# Patient Record
Sex: Female | Born: 1945
Health system: Southern US, Community
[De-identification: ages and names within clinical notes are randomized; demographics above are authoritative.]

## PROBLEM LIST (undated history)

## (undated) DIAGNOSIS — R252 Cramp and spasm: Secondary | ICD-10-CM

## (undated) DIAGNOSIS — F329 Major depressive disorder, single episode, unspecified: Secondary | ICD-10-CM

## (undated) DIAGNOSIS — F32A Depression, unspecified: Secondary | ICD-10-CM

## (undated) DIAGNOSIS — I10 Essential (primary) hypertension: Secondary | ICD-10-CM

## (undated) DIAGNOSIS — C50212 Malignant neoplasm of upper-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 breast: Secondary | ICD-10-CM

## (undated) DIAGNOSIS — C50911 Malignant neoplasm of unspecified site of right female breast: Secondary | ICD-10-CM

## (undated) DIAGNOSIS — E785 Hyperlipidemia, unspecified: Secondary | ICD-10-CM

## (undated) DIAGNOSIS — J189 Pneumonia, unspecified organism: Secondary | ICD-10-CM

## (undated) DIAGNOSIS — M810 Age-related osteoporosis without current pathological fracture: Secondary | ICD-10-CM

## (undated) DIAGNOSIS — C50912 Malignant neoplasm of unspecified site of left female breast: Secondary | ICD-10-CM

## (undated) DIAGNOSIS — T7840XA Allergy, unspecified, initial encounter: Secondary | ICD-10-CM

## (undated) DIAGNOSIS — K219 Gastro-esophageal reflux disease without esophagitis: Secondary | ICD-10-CM

## (undated) DIAGNOSIS — D649 Anemia, unspecified: Secondary | ICD-10-CM

## (undated) DIAGNOSIS — Q396 Congenital diverticulum of esophagus: Secondary | ICD-10-CM

## (undated) DIAGNOSIS — F419 Anxiety disorder, unspecified: Secondary | ICD-10-CM

## (undated) DIAGNOSIS — I00 Rheumatic fever without heart involvement: Secondary | ICD-10-CM

## (undated) DIAGNOSIS — M1990 Unspecified osteoarthritis, unspecified site: Secondary | ICD-10-CM

## (undated) DIAGNOSIS — H269 Unspecified cataract: Secondary | ICD-10-CM

## (undated) DIAGNOSIS — G43909 Migraine, unspecified, not intractable, without status migrainosus: Secondary | ICD-10-CM

## (undated) HISTORY — DX: Malignant neoplasm of upper-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 breast: C50.212

## (undated) HISTORY — DX: Hyperlipidemia, unspecified: E78.5

## (undated) HISTORY — PX: COLONOSCOPY: SHX174

## (undated) HISTORY — PX: TONSILLECTOMY: SUR1361

## (undated) HISTORY — PX: EYE MUSCLE SURGERY: SHX370

## (undated) HISTORY — DX: Essential (primary) hypertension: I10

## (undated) HISTORY — DX: Major depressive disorder, single episode, unspecified: F32.9

## (undated) HISTORY — DX: Allergy, unspecified, initial encounter: T78.40XA

## (undated) HISTORY — DX: Anxiety disorder, unspecified: F41.9

## (undated) HISTORY — DX: Unspecified cataract: H26.9

## (undated) HISTORY — DX: Congenital diverticulum of esophagus: Q39.6

## (undated) HISTORY — PX: CATARACT EXTRACTION W/ INTRAOCULAR LENS  IMPLANT, BILATERAL: SHX1307

## (undated) HISTORY — DX: Depression, unspecified: F32.A

## (undated) HISTORY — DX: Age-related osteoporosis without current pathological fracture: M81.0

---

## 2002-08-27 ENCOUNTER — Encounter: Payer: Self-pay | Admitting: Emergency Medicine

## 2002-08-27 ENCOUNTER — Emergency Department (HOSPITAL_COMMUNITY): Admission: EM | Admit: 2002-08-27 | Discharge: 2002-08-27 | Payer: Self-pay | Admitting: Emergency Medicine

## 2006-02-19 ENCOUNTER — Other Ambulatory Visit: Admission: RE | Admit: 2006-02-19 | Discharge: 2006-02-19 | Payer: Self-pay | Admitting: Gynecology

## 2008-04-28 ENCOUNTER — Other Ambulatory Visit: Admission: RE | Admit: 2008-04-28 | Discharge: 2008-04-28 | Payer: Self-pay | Admitting: Family Medicine

## 2014-04-29 DIAGNOSIS — E785 Hyperlipidemia, unspecified: Secondary | ICD-10-CM | POA: Diagnosis not present

## 2014-04-29 DIAGNOSIS — M62838 Other muscle spasm: Secondary | ICD-10-CM | POA: Diagnosis not present

## 2014-04-29 DIAGNOSIS — I10 Essential (primary) hypertension: Secondary | ICD-10-CM | POA: Diagnosis not present

## 2014-04-29 DIAGNOSIS — K219 Gastro-esophageal reflux disease without esophagitis: Secondary | ICD-10-CM | POA: Diagnosis not present

## 2014-05-06 DIAGNOSIS — H2510 Age-related nuclear cataract, unspecified eye: Secondary | ICD-10-CM | POA: Diagnosis not present

## 2014-08-25 DIAGNOSIS — G4762 Sleep related leg cramps: Secondary | ICD-10-CM | POA: Diagnosis not present

## 2014-08-25 DIAGNOSIS — I10 Essential (primary) hypertension: Secondary | ICD-10-CM | POA: Diagnosis not present

## 2014-10-18 DIAGNOSIS — H2513 Age-related nuclear cataract, bilateral: Secondary | ICD-10-CM | POA: Diagnosis not present

## 2014-12-16 HISTORY — PX: BREAST BIOPSY: SHX20

## 2015-04-18 DIAGNOSIS — H2513 Age-related nuclear cataract, bilateral: Secondary | ICD-10-CM | POA: Diagnosis not present

## 2015-04-18 DIAGNOSIS — H01005 Unspecified blepharitis left lower eyelid: Secondary | ICD-10-CM | POA: Diagnosis not present

## 2015-04-18 DIAGNOSIS — H01002 Unspecified blepharitis right lower eyelid: Secondary | ICD-10-CM | POA: Diagnosis not present

## 2015-05-29 DIAGNOSIS — H2512 Age-related nuclear cataract, left eye: Secondary | ICD-10-CM | POA: Diagnosis not present

## 2015-05-29 DIAGNOSIS — H25811 Combined forms of age-related cataract, right eye: Secondary | ICD-10-CM | POA: Diagnosis not present

## 2015-05-29 DIAGNOSIS — H25812 Combined forms of age-related cataract, left eye: Secondary | ICD-10-CM | POA: Diagnosis not present

## 2015-06-26 DIAGNOSIS — H25811 Combined forms of age-related cataract, right eye: Secondary | ICD-10-CM | POA: Diagnosis not present

## 2015-06-26 DIAGNOSIS — H2511 Age-related nuclear cataract, right eye: Secondary | ICD-10-CM | POA: Diagnosis not present

## 2015-07-28 ENCOUNTER — Ambulatory Visit (INDEPENDENT_AMBULATORY_CARE_PROVIDER_SITE_OTHER): Payer: Medicare Other | Admitting: Family

## 2015-07-28 ENCOUNTER — Encounter: Payer: Self-pay | Admitting: Family

## 2015-07-28 VITALS — BP 138/88 | HR 74 | Temp 98.6°F | Resp 18 | Ht 60.0 in | Wt 152.8 lb

## 2015-07-28 DIAGNOSIS — K429 Umbilical hernia without obstruction or gangrene: Secondary | ICD-10-CM

## 2015-07-28 DIAGNOSIS — L821 Other seborrheic keratosis: Secondary | ICD-10-CM | POA: Insufficient documentation

## 2015-07-28 DIAGNOSIS — E781 Pure hyperglyceridemia: Secondary | ICD-10-CM

## 2015-07-28 DIAGNOSIS — I10 Essential (primary) hypertension: Secondary | ICD-10-CM | POA: Diagnosis not present

## 2015-07-28 MED ORDER — CYCLOBENZAPRINE HCL 10 MG PO TABS: 10.0000 mg | ORAL_TABLET | Freq: Three times a day (TID) | ORAL | Status: DC | PRN

## 2015-07-28 MED ORDER — LOSARTAN POTASSIUM-HCTZ 50-12.5 MG PO TABS: 1.0000 | ORAL_TABLET | Freq: Every day | ORAL | Status: DC

## 2015-07-28 MED ORDER — GEMFIBROZIL 600 MG PO TABS: 600.0000 mg | ORAL_TABLET | Freq: Two times a day (BID) | ORAL | Status: DC

## 2015-07-28 NOTE — Assessment & Plan Note (Signed)
Exam consistent with seborrheic keratosis, although cannot rule out wart. Refer to dermatology for further assessment and biopsy if necessary.

## 2015-07-28 NOTE — Patient Instructions (Signed)
Thank you for choosing [facility].  Summary/Instructions:  Your prescription(s) have been submitted to your pharmacy or been printed and provided for you. Please take as directed and contact our office if you believe you are having problem(s) with the medication(s) or have any questions.  Referrals have been made during this visit. You should expect to hear back from our schedulers in about 7-10 days in regards to establishing an appointment with the specialists we discussed.   If your symptoms worsen or fail to improve, please contact our office for further instruction, or in case of emergency go directly to the emergency room at the closest medical facility.   Hernia A hernia occurs when an internal organ pushes out through a weak spot in the abdominal wall. Hernias most commonly occur in the groin and around the navel. Hernias often can be pushed back into place (reduced). Most hernias tend to get worse over time. Some abdominal hernias can get stuck in the opening (irreducible or incarcerated hernia) and cannot be reduced. An irreducible abdominal hernia which is tightly squeezed into the opening is at risk for impaired blood supply (strangulated hernia). A strangulated hernia is a medical emergency. Because of the risk for an irreducible or strangulated hernia, surgery may be recommended to repair a hernia. CAUSES   Heavy lifting.  Prolonged coughing.  Straining to have a bowel movement.  A cut (incision) made during an abdominal surgery. HOME CARE INSTRUCTIONS   Bed rest is not required. You may continue your normal activities.  Avoid lifting more than 10 pounds (4.5 kg) or straining.  Cough gently. If you are a smoker it is best to stop. Even the best hernia repair can break down with the continual strain of coughing. Even if you do not have your hernia repaired, a cough will continue to aggravate the problem.  Do not wear anything tight over your hernia. Do not try to  keep it in with an outside bandage or truss. These can damage abdominal contents if they are trapped within the hernia sac.  Eat a normal diet.  Avoid constipation. Straining over long periods of time will increase hernia size and encourage breakdown of repairs. If you cannot do this with diet alone, stool softeners may be used. SEEK IMMEDIATE MEDICAL CARE IF:   You have a fever.  You develop increasing abdominal pain.  You feel nauseous or vomit.  Your hernia is stuck outside the abdomen, looks discolored, feels hard, or is tender.  You have any changes in your bowel habits or in the hernia that are unusual for you.  You have increased pain or swelling around the hernia.  You cannot push the hernia back in place by applying gentle pressure while lying down. MAKE SURE YOU:   Understand these instructions.  Will watch your condition.  Will get help right away if you are not doing well or get worse. Document Released: 12/02/2005 Document Revised: 02/24/2012 Document Reviewed: 07/21/2008 [facility] Patient Information 2015 [place], [place]. This information is not intended to replace advice given to you by your health care provider. Make sure you discuss any questions you have with your health care provider.

## 2015-07-28 NOTE — Assessment & Plan Note (Signed)
Umbilical hernia with some occasional discomfort. Refer to general surgery for follow up and possible repair.

## 2015-07-28 NOTE — Progress Notes (Signed)
Pre visit review using our clinic review tool, if applicable. No additional management support is needed unless otherwise documented below in the visit note. 

## 2015-07-28 NOTE — Progress Notes (Signed)
Subjective:    Patient ID: Kristen Howe, female    DOB: [birth year], 69 y.o.   MRN: [id number]  Chief Complaint  Patient presents with  . Establish Care    has what looks like an umblical hernia, has been there a while and has grown over time, x1 year, has a place under her left breast, x3 months    HPI:  Kristen Howe is a 69 y.o. female with a PMH of hyperlipidemia and hypertension who presents today for an office visit to establish care.     1.) Hernia - Associated symptom of a mass located in the center of her abdomen has been going on for about 1 year. It has increased in size since she first noticed it. Occasional mild pain described as soreness on occasion. Soreness dissaptes on its own. There have been no attempts at treatments. Sneezing and occasional coughing makes it worse.   2.) Spot on skin - Associated symptom of a rash located under her left breast is described as a "rubbery thing" has been there for about a month. Denies any changes in size or color since she first noticed it.   No Known Allergies   No outpatient prescriptions prior to visit.   No facility-administered medications prior to visit.     Past Medical History  Diagnosis Date  . Hypertension   . Hyperlipidemia      Past Surgical History  Procedure Laterality Date  . Eye surgery    . Cesarean section       Family History  Problem Relation Age of Onset  . Healthy Mother   . Lymphoma Father      Social History   Social History  . Marital Status: Divorced    Spouse Name: N/A  . Number of Children: 2  . Years of Education: 12   Occupational History  . Unemployed    Social History Main Topics  . Smoking status: Current Every Day Smoker -- 0.50 packs/day for 30 years    Types: Cigarettes  . Smokeless tobacco: Never Used  . Alcohol Use: 0.0 oz/week    0 Standard drinks or equivalent per week     Comment: occasionally  . Drug Use: No  . Sexual Activity: Not on file   Other  Topics Concern  . Not on file   Social History Narrative   Fun: Internet; games, talk to people   Denies religious beliefs effecting health care.   Denies abuse and feels safe at home.     Review of Systems  Constitutional: Negative for fever and chills.  Gastrointestinal: Negative for nausea, vomiting, abdominal pain, constipation and abdominal distention.  Skin: Positive for rash.      Objective:    BP 138/88 mmHg  Pulse 74  Temp(Src) 98.6 F (37 C) (Oral)  Resp 18  Ht 5' (1.524 m)  Wt 152 lb 12.8 oz (69.31 kg)  BMI 29.84 kg/m2  SpO2 96% Nursing note and vital signs reviewed.  Physical Exam  Constitutional: She is oriented to person, place, and time. She appears well-developed and well-nourished. No distress.  Cardiovascular: Normal rate, regular rhythm, normal heart sounds and intact distal pulses.   Pulmonary/Chest: Effort normal and breath sounds normal.    Abdominal: A hernia (Umbilical) is present.    Neurological: She is alert and oriented to person, place, and time.  Skin: Skin is warm and dry.  Psychiatric: She has a normal mood and affect. Her behavior is normal. Judgment and thought content  normal.       Assessment & Plan:   Problem List Items Addressed This Visit      Cardiovascular and Mediastinum   Essential hypertension   Relevant Medications   losartan-hydrochlorothiazide (HYZAAR) 50-12.5 MG per tablet   gemfibrozil (LOPID) 600 MG tablet     Musculoskeletal and Integument   Seborrheic keratosis    Exam consistent with seborrheic keratosis, although cannot rule out wart. Refer to dermatology for further assessment and biopsy if necessary.       Relevant Orders   Ambulatory referral to General Surgery     Other   Umbilical hernia without obstruction and without gangrene - Primary    Umbilical hernia with some occasional discomfort. Refer to general surgery for follow up and possible repair.       Relevant Orders   Ambulatory referral  to Dermatology    Other Visit Diagnoses    Hypertriglyceridemia        Relevant Medications    losartan-hydrochlorothiazide (HYZAAR) 50-12.5 MG per tablet    gemfibrozil (LOPID) 600 MG tablet

## 2015-08-03 ENCOUNTER — Encounter: Payer: Self-pay | Admitting: Family

## 2015-08-03 NOTE — Addendum Note (Signed)
Addended by: Mauricio Po D on: 08/03/2015 03:24 PM   Modules accepted: Orders

## 2015-08-10 ENCOUNTER — Telehealth: Payer: Self-pay | Admitting: Family

## 2015-08-10 NOTE — Telephone Encounter (Signed)
Received records from [facility] forwarded 35 pages to Dr. Mauricio Po 08/10/15 fbg.

## 2015-08-18 ENCOUNTER — Ambulatory Visit: Payer: Self-pay | Admitting: Surgery

## 2015-08-18 DIAGNOSIS — K420 Umbilical hernia with obstruction, without gangrene: Secondary | ICD-10-CM | POA: Diagnosis not present

## 2015-08-18 DIAGNOSIS — K432 Incisional hernia without obstruction or gangrene: Secondary | ICD-10-CM | POA: Diagnosis not present

## 2015-08-18 NOTE — H&P (Signed)
Kristen Howe 08/18/2015 8:51 AM Location: [facility] Patient #: [id number] DOB: 1946/07/24 Divorced / Language: Kristen Howe / Race: White Female History of Present Illness Kristen Moores A. Isaid Salvia MD; 08/18/2015 9:20 AM) Patient words: hernia  Pt sent at the request of Kristen Po FNP for umbilical/incisional hernia. It has been present for many years. It causes mild to moderate pain. It has stuck out for years she states. No change in bowel or bladder function. She has had a C section and incision extends to umbilicus where her hernia is located.  The patient is a 69 year old female   Other Problems Kristen Howe, Kristen Howe; 08/18/2015 8:51 AM) Arthritis Back Pain Gastroesophageal Reflux Disease High blood pressure Hypercholesterolemia  Past Surgical History Kristen Howe, [name]; 08/18/2015 8:51 AM) Cataract Surgery Bilateral. Cesarean Section - Multiple Oral Surgery Tonsillectomy  Diagnostic Studies History Kristen Howe, [name]; 08/18/2015 8:51 AM) Colonoscopy never Mammogram >3 years ago Pap Smear >5 years ago  Allergies Kristen Howe, [name]; 08/18/2015 8:52 AM) No Known Drug Allergies 08/18/2015  Medication History (Kristen Howe, [name]; 08/18/2015 8:53 AM) Gemfibrozil (600MG  Tablet, Oral) Active. Gentamicin Sulfate (0.3% Solution, Ophthalmic) Active. Losartan Potassium-HCTZ (50-12.5MG  Tablet, Oral) Active. Durezol (0.05% Emulsion, Ophthalmic) Active. Medications Reconciled  Social History Kristen Howe, [name]; 08/18/2015 8:51 AM) Alcohol use Moderate alcohol use. Caffeine use Coffee. No drug use Tobacco use Current every day smoker.  Family History Kristen Howe, [name]; 08/18/2015 8:51 AM) Breast Cancer Sister. Hypertension Mother.  Pregnancy / Birth History Kristen Howe, [name]; 08/18/2015 8:52 AM) Age at menarche 28 years. Age of menopause 51-55 Contraceptive History Oral contraceptives. Gravida 2 Maternal age 75-30 Para 2 Regular periods     Review of  Systems Kristen Howe Howe [name]; 08/18/2015 8:52 AM) General Not Present- Appetite Loss, Chills, Fatigue, Fever, Night Sweats, Weight Gain and Weight Loss. Skin Present- Dryness. Not Present- Change in Wart/Mole, Hives, Jaundice, New Lesions, Non-Healing Wounds, Rash and Ulcer. HEENT Present- Sinus Pain. Not Present- Earache, Hearing Loss, Hoarseness, Nose Bleed, Oral Ulcers, Ringing in the Ears, Seasonal Allergies, Sore Throat, Visual Disturbances, Wears glasses/contact lenses and Yellow Eyes. Respiratory Present- Snoring. Not Present- Bloody sputum, Chronic Cough, Difficulty Breathing and Wheezing. Breast Not Present- Breast Mass, Breast Pain, Nipple Discharge and Skin Changes. Cardiovascular Present- Leg Cramps and Swelling of Extremities. Not Present- Chest Pain, Difficulty Breathing Lying Down, Palpitations, Rapid Heart Rate and Shortness of Breath. Gastrointestinal Present- Indigestion and Nausea. Not Present- Abdominal Pain, Bloating, Bloody Stool, Change in Bowel Habits, Chronic diarrhea, Constipation, Difficulty Swallowing, Excessive gas, Gets full quickly at meals, Hemorrhoids, Rectal Pain and Vomiting. Female Genitourinary Not Present- Frequency, Nocturia, Painful Urination, Pelvic Pain and Urgency. Musculoskeletal Present- Back Pain and Joint Stiffness. Not Present- Joint Pain, Muscle Pain, Muscle Weakness and Swelling of Extremities. Neurological Not Present- Decreased Memory, Fainting, Headaches, Numbness, Seizures, Tingling, Tremor, Trouble walking and Weakness. Psychiatric Not Present- Anxiety, Bipolar, Change in Sleep Pattern, Depression, Fearful and Frequent crying. Endocrine Not Present- Cold Intolerance, Excessive Hunger, Hair Changes, Heat Intolerance, Hot flashes and New Diabetes. Hematology Not Present- Easy Bruising, Excessive bleeding, Gland problems, HIV and Persistent Infections.  Vitals (Kristen Howe [name]; 08/18/2015 8:52 AM) 08/18/2015 8:52 AM Weight: 155 lb Height: 60in Body  Surface Area: 1.73 m Body Mass Index: 30.27 kg/m Temp.: 57F(Temporal)  Pulse: 79 (Regular)  BP: 126/80 (Sitting, Left Arm, Standard)     Physical Exam (Kristen Grime A. Kristen Usery MD; 08/18/2015 9:21 AM)  General Mental Status-Alert. General Appearance-Consistent with stated age. Hydration-Well hydrated. Voice-Normal.  Eye Note: right eye strabismus  Chest and Lung Exam Chest and lung exam reveals -quiet, even and easy respiratory effort with no use of accessory muscles and on auscultation, normal breath sounds, no adventitious sounds and normal vocal resonance. Inspection Chest Wall - Normal. Back - normal.  Cardiovascular Cardiovascular examination reveals -normal heart sounds, regular rate and rhythm with no murmurs and normal pedal pulses bilaterally.  Abdomen Note: incarcerted 5 cm umbilical/ uper incisional hernia not tender mild chronic skin changes    Neurologic Neurologic evaluation reveals -alert and oriented x 3 with no impairment of recent or remote memory. Mental Status-Normal.  Musculoskeletal Normal Exam - Left-Upper Extremity Strength Normal and Lower Extremity Strength Normal. Normal Exam - Right-Upper Extremity Strength Normal and Lower Extremity Strength Normal.    Assessment & Plan (Kristen Carrero A. Kristen Cumpston MD; 08/18/2015 9:22 AM)  INCARCERATED UMBILICAL HERNIA (355.7  K42.0) Impression: discussd repair vs observation and pro and cons of each. She desires repair. The risk of hernia repair include bleeding, infection, organ injury, bowel injury, bladder injury, nerve injury recurrent hernia, blood clots, worsening of underlying condition, chronic pain, mesh use, open surgery, death, and the need for other operattions. Pt agrees to proceed  Current Plans You are being scheduled for surgery - Our schedulers will call you.  You should hear from our office's scheduling department within 5 working days about the location, date, and time of  surgery. We try to make accommodations for patient's preferences in scheduling surgery, but sometimes the OR schedule or the surgeon's schedule prevents [place] from making those accommodations.  If you have not heard from our office [phone]) in 5 working days, call the office and ask for your surgeon's nurse.  If you have other questions about your diagnosis, plan, or surgery, call the office and ask for your surgeon's nurse. Written instructions provided   The anatomy & physiology of the abdominal wall was discussed. The pathophysiology of hernias was discussed. Natural history risks without surgery including progeressive enlargement, pain, incarceration, & strangulation was discussed. Contributors to complications such as smoking, obesity, diabetes, prior surgery, etc were discussed.  I feel the risks of no intervention will lead to serious problems that outweigh the operative risks; therefore, I recommended surgery to reduce and repair the hernia. I explained laparoscopic techniques with possible need for an open approach. I noted the probable use of mesh to patch and/or buttress the hernia repair  Risks such as bleeding, infection, abscess, need for further treatment, heart attack, death, and other risks were discussed. I noted a good likelihood this will help address the problem. Goals of post-operative recovery were discussed as well. Possibility that this will not correct all symptoms was explained. I stressed the importance of low-impact activity, aggressive pain control, avoiding constipation, & not pushing through pain to minimize risk of post-operative chronic pain or injury. Possibility of reherniation especially with smoking, obesity, diabetes, immunosuppression, and other health conditions was discussed. We will work to minimize complications.  An educational handout further explaining the pathology & treatment options was given as well. Questions were answered. The patient expresses  understanding & wishes to proceed with surgery. Pt Education - [facility] Hernia Post-Op HCI (Gross): discussed with patient and provided information. Pt Education - [facility] Pain Control (Gross) Pt Education - Pamphlet Given - Hernia Surgery: discussed with patient and provided information. Pt Education - Overview of abdominal wall hernias in adults: discussed with patient and provided information. INCISIONAL HERNIA, WITHOUT OBSTRUCTION OR GANGRENE (553.21  K43.2)

## 2015-09-06 ENCOUNTER — Encounter (HOSPITAL_COMMUNITY): Payer: Self-pay

## 2015-09-06 ENCOUNTER — Encounter (HOSPITAL_COMMUNITY)
Admission: RE | Admit: 2015-09-06 | Discharge: 2015-09-06 | Disposition: A | Discharge status: Home / Self Care | Payer: Medicare Other | Source: Ambulatory Visit | Attending: Surgery | Admitting: Surgery

## 2015-09-06 DIAGNOSIS — Z01812 Encounter for preprocedural laboratory examination: Secondary | ICD-10-CM | POA: Diagnosis not present

## 2015-09-06 DIAGNOSIS — I10 Essential (primary) hypertension: Secondary | ICD-10-CM | POA: Diagnosis not present

## 2015-09-06 DIAGNOSIS — R001 Bradycardia, unspecified: Secondary | ICD-10-CM | POA: Insufficient documentation

## 2015-09-06 DIAGNOSIS — K429 Umbilical hernia without obstruction or gangrene: Secondary | ICD-10-CM | POA: Insufficient documentation

## 2015-09-06 HISTORY — DX: Gastro-esophageal reflux disease without esophagitis: K21.9

## 2015-09-06 HISTORY — DX: Pneumonia, unspecified organism: J18.9

## 2015-09-06 HISTORY — DX: Unspecified osteoarthritis, unspecified site: M19.90

## 2015-09-06 HISTORY — DX: Anemia, unspecified: D64.9

## 2015-09-06 HISTORY — DX: Cramp and spasm: R25.2

## 2015-09-06 LAB — CBC WITH DIFFERENTIAL/PLATELET
BASOS ABS: 0 10*3/uL (ref 0.0–0.1)
BASOS PCT: 1 %
Eosinophils Absolute: 0.2 10*3/uL (ref 0.0–0.7)
Eosinophils Relative: 3 %
HEMATOCRIT: 41.1 % (ref 36.0–46.0)
HEMOGLOBIN: 13.7 g/dL (ref 12.0–15.0)
LYMPHS PCT: 33 %
Lymphs Abs: 2.2 10*3/uL (ref 0.7–4.0)
MCH: 28.8 pg (ref 26.0–34.0)
MCHC: 33.3 g/dL (ref 30.0–36.0)
MCV: 86.5 fL (ref 78.0–100.0)
MONO ABS: 0.4 10*3/uL (ref 0.1–1.0)
Monocytes Relative: 7 %
NEUTROS ABS: 3.8 10*3/uL (ref 1.7–7.7)
NEUTROS PCT: 56 %
Platelets: 308 10*3/uL (ref 150–400)
RBC: 4.75 MIL/uL (ref 3.87–5.11)
RDW: 13.4 % (ref 11.5–15.5)
WBC: 6.6 10*3/uL (ref 4.0–10.5)

## 2015-09-06 LAB — COMPREHENSIVE METABOLIC PANEL
ALBUMIN: 4 g/dL (ref 3.5–5.0)
ALK PHOS: 67 U/L (ref 38–126)
ALT: 21 U/L (ref 14–54)
AST: 20 U/L (ref 15–41)
Anion gap: 6 (ref 5–15)
BILIRUBIN TOTAL: 0.4 mg/dL (ref 0.3–1.2)
BUN: 16 mg/dL (ref 6–20)
CO2: 29 mmol/L (ref 22–32)
CREATININE: 0.79 mg/dL (ref 0.44–1.00)
Calcium: 9.9 mg/dL (ref 8.9–10.3)
Chloride: 103 mmol/L (ref 101–111)
GFR calc Af Amer: >60 mL/min (ref >60)
GLUCOSE: 127 mg/dL — AB (ref 65–99)
Potassium: 3.9 mmol/L (ref 3.5–5.1)
Sodium: 138 mmol/L (ref 135–145)
TOTAL PROTEIN: 7.1 g/dL (ref 6.5–8.1)

## 2015-09-06 NOTE — Pre-Procedure Instructions (Signed)
Kristen Howe  09/06/2015     Your procedure is scheduled on  Tuesday, September 12, 2015 at 7:30 AM.   Report to [facility] Entrance "A" Admitting Office at 5:30 AM.   Call this number if you have problems the morning of surgery: [phone]   Any questions prior to day of surgery, please call [phone] between 8 & 4 PM.   Remember:  Do not eat food or drink liquids after midnight Monday, 09/11/15   Stop Multivitamins as of today.    Do not wear jewelry, make-up or nail polish.  Do not wear lotions, powders, or perfumes.  You may wear deodorant.  Do not shave 48 hours prior to surgery.    Do not bring valuables to the hospital.  [facility] is not responsible for any belongings or valuables.  Contacts, dentures or bridgework may not be worn into surgery.  Leave your suitcase in the car.  After surgery it may be brought to your room.  For patients admitted to the hospital, discharge time will be determined by your treatment team.  Patients discharged the day of surgery will not be allowed to drive home.   Special instructions: [place] - Preparing for Surgery  Before surgery, you can play an important role.  Because skin is not sterile, your skin needs to be as free of germs as possible.  You can reduce the number of germs on you skin by washing with CHG (chlorahexidine gluconate) soap before surgery.  CHG is an antiseptic cleaner which kills germs and bonds with the skin to continue killing germs even after washing.  Please DO NOT use if you have an allergy to CHG or antibacterial soaps.  If your skin becomes reddened/irritated stop using the CHG and inform your nurse when you arrive at Short Stay.  Do not shave (including legs and underarms) for at least 48 hours prior to the first CHG shower.  You may shave your face.  Please follow these instructions carefully:   1.  Shower with CHG Soap the night before surgery and the                                 morning of Surgery.  2.  If you choose to wash your hair, wash your hair first as usual with your       normal shampoo.  3.  After you shampoo, rinse your hair and body thoroughly to remove the                      Shampoo.  4.  Use CHG as you would any other liquid soap.  You can apply chg directly       to the skin and wash gently with scrungie or a clean washcloth.  5.  Apply the CHG Soap to your body ONLY FROM THE NECK DOWN.        Do not use on open wounds or open sores.  Avoid contact with your eyes, ears, mouth and genitals (private parts).  Wash genitals (private parts) with your normal soap.  6.  Wash thoroughly, paying special attention to the area where your surgery        will be performed.  7.  Thoroughly rinse your body with warm water from the neck down.  8.  DO NOT shower/wash with your normal soap after using and rinsing off  the CHG Soap.  9.  Pat yourself dry with a clean towel.            10.  Wear clean pajamas.            11.  Place clean sheets on your bed the night of your first shower and do not        sleep with pets.  Day of Surgery  Do not apply any lotions the morning of surgery.  Please wear clean clothes to the hospital.   Please read over the following fact sheets that you were given. Pain Booklet, Coughing and Deep Breathing and Surgical Site Infection Prevention

## 2015-09-07 ENCOUNTER — Other Ambulatory Visit (HOSPITAL_COMMUNITY): Payer: Medicare Other

## 2015-09-11 MED ORDER — DEXTROSE 5 % IV SOLN
3.0000 g | INTRAVENOUS | Status: AC
  Administered 2015-09-12: 3 g via INTRAVENOUS
  Filled 2015-09-11: qty 3000

## 2015-09-11 MED ORDER — CHLORHEXIDINE GLUCONATE 4 % EX LIQD: 1.0000 "application " | CUTANEOUS | Status: DC

## 2015-09-12 ENCOUNTER — Ambulatory Visit (HOSPITAL_COMMUNITY): Payer: Medicare Other | Admitting: Anesthesiology

## 2015-09-12 ENCOUNTER — Encounter (HOSPITAL_COMMUNITY)
Admission: RE | Disposition: A | Discharge status: Home / Self Care | Payer: Self-pay | Source: Ambulatory Visit | Attending: Surgery

## 2015-09-12 ENCOUNTER — Ambulatory Visit (HOSPITAL_COMMUNITY)
Admission: RE | Admit: 2015-09-12 | Discharge: 2015-09-12 | Disposition: A | Discharge status: Home / Self Care | Payer: Medicare Other | Source: Ambulatory Visit | Attending: Surgery | Admitting: Surgery

## 2015-09-12 ENCOUNTER — Encounter (HOSPITAL_COMMUNITY): Payer: Self-pay | Admitting: *Deleted

## 2015-09-12 DIAGNOSIS — K429 Umbilical hernia without obstruction or gangrene: Secondary | ICD-10-CM | POA: Diagnosis not present

## 2015-09-12 DIAGNOSIS — K219 Gastro-esophageal reflux disease without esophagitis: Secondary | ICD-10-CM | POA: Insufficient documentation

## 2015-09-12 DIAGNOSIS — I10 Essential (primary) hypertension: Secondary | ICD-10-CM | POA: Insufficient documentation

## 2015-09-12 DIAGNOSIS — F17200 Nicotine dependence, unspecified, uncomplicated: Secondary | ICD-10-CM | POA: Insufficient documentation

## 2015-09-12 DIAGNOSIS — M1990 Unspecified osteoarthritis, unspecified site: Secondary | ICD-10-CM | POA: Insufficient documentation

## 2015-09-12 DIAGNOSIS — E780 Pure hypercholesterolemia: Secondary | ICD-10-CM | POA: Insufficient documentation

## 2015-09-12 DIAGNOSIS — K430 Incisional hernia with obstruction, without gangrene: Secondary | ICD-10-CM | POA: Insufficient documentation

## 2015-09-12 DIAGNOSIS — D649 Anemia, unspecified: Secondary | ICD-10-CM | POA: Diagnosis not present

## 2015-09-12 DIAGNOSIS — Z79899 Other long term (current) drug therapy: Secondary | ICD-10-CM | POA: Insufficient documentation

## 2015-09-12 DIAGNOSIS — K432 Incisional hernia without obstruction or gangrene: Secondary | ICD-10-CM | POA: Diagnosis not present

## 2015-09-12 HISTORY — PX: INSERTION OF MESH: SHX5868

## 2015-09-12 HISTORY — PX: UMBILICAL HERNIA REPAIR: SHX196

## 2015-09-12 SURGERY — REPAIR, HERNIA, UMBILICAL, ADULT
Anesthesia: General | Site: Abdomen

## 2015-09-12 MED ORDER — ALBUTEROL SULFATE (2.5 MG/3ML) 0.083% IN NEBU
2.5000 mg | INHALATION_SOLUTION | Freq: Four times a day (QID) | RESPIRATORY_TRACT | Status: DC | PRN
  Administered 2015-09-12: 2.5 mg via RESPIRATORY_TRACT

## 2015-09-12 MED ORDER — ONDANSETRON HCL 4 MG/2ML IJ SOLN
INTRAMUSCULAR | Status: DC | PRN
  Administered 2015-09-12: 4 mg via INTRAVENOUS

## 2015-09-12 MED ORDER — PHENYLEPHRINE 40 MCG/ML (10ML) SYRINGE FOR IV PUSH (FOR BLOOD PRESSURE SUPPORT)
PREFILLED_SYRINGE | INTRAVENOUS | Status: AC
  Filled 2015-09-12: qty 10

## 2015-09-12 MED ORDER — ONDANSETRON HCL 4 MG/2ML IJ SOLN
INTRAMUSCULAR | Status: AC
  Filled 2015-09-12: qty 2

## 2015-09-12 MED ORDER — PHENYLEPHRINE HCL 10 MG/ML IJ SOLN
INTRAMUSCULAR | Status: DC | PRN
  Administered 2015-09-12 (×3): 80 ug via INTRAVENOUS

## 2015-09-12 MED ORDER — GLYCOPYRROLATE 0.2 MG/ML IJ SOLN
INTRAMUSCULAR | Status: DC | PRN
  Administered 2015-09-12: 0.6 mg via INTRAVENOUS

## 2015-09-12 MED ORDER — DEXTROSE 5 % IV SOLN
INTRAVENOUS | Status: DC | PRN
  Administered 2015-09-12: 08:00:00 via INTRAVENOUS

## 2015-09-12 MED ORDER — LIDOCAINE HCL (CARDIAC) 20 MG/ML IV SOLN
INTRAVENOUS | Status: AC
  Filled 2015-09-12: qty 5

## 2015-09-12 MED ORDER — PROPOFOL 10 MG/ML IV BOLUS
INTRAVENOUS | Status: DC | PRN
  Administered 2015-09-12: 140 mg via INTRAVENOUS

## 2015-09-12 MED ORDER — ROCURONIUM BROMIDE 50 MG/5ML IV SOLN
INTRAVENOUS | Status: AC
  Filled 2015-09-12: qty 1

## 2015-09-12 MED ORDER — OXYCODONE HCL 5 MG PO TABS
5.0000 mg | ORAL_TABLET | Freq: Once | ORAL | Status: AC | PRN
  Administered 2015-09-12: 5 mg via ORAL

## 2015-09-12 MED ORDER — FENTANYL CITRATE (PF) 250 MCG/5ML IJ SOLN
INTRAMUSCULAR | Status: AC
  Filled 2015-09-12: qty 5

## 2015-09-12 MED ORDER — OXYCODONE HCL 5 MG/5ML PO SOLN: 5.0000 mg | Freq: Once | ORAL | Status: AC | PRN

## 2015-09-12 MED ORDER — OXYCODONE HCL 5 MG PO TABS
ORAL_TABLET | ORAL | Status: AC
  Filled 2015-09-12: qty 1

## 2015-09-12 MED ORDER — OXYCODONE-ACETAMINOPHEN 5-325 MG PO TABS: 1.0000 | ORAL_TABLET | ORAL | Status: DC | PRN

## 2015-09-12 MED ORDER — HYDROMORPHONE HCL 1 MG/ML IJ SOLN
0.2500 mg | INTRAMUSCULAR | Status: DC | PRN
  Administered 2015-09-12 (×2): 0.5 mg via INTRAVENOUS

## 2015-09-12 MED ORDER — BUPIVACAINE-EPINEPHRINE 0.25% -1:200000 IJ SOLN
INTRAMUSCULAR | Status: DC | PRN
  Administered 2015-09-12: 30 mL

## 2015-09-12 MED ORDER — NEOSTIGMINE METHYLSULFATE 10 MG/10ML IV SOLN
INTRAVENOUS | Status: DC | PRN
  Administered 2015-09-12: 4 mg via INTRAVENOUS

## 2015-09-12 MED ORDER — PROPOFOL 10 MG/ML IV BOLUS
INTRAVENOUS | Status: AC
  Filled 2015-09-12: qty 20

## 2015-09-12 MED ORDER — 0.9 % SODIUM CHLORIDE (POUR BTL) OPTIME
TOPICAL | Status: DC | PRN
  Administered 2015-09-12: 1000 mL

## 2015-09-12 MED ORDER — LACTATED RINGERS IV SOLN
INTRAVENOUS | Status: DC | PRN
  Administered 2015-09-12 (×2): via INTRAVENOUS

## 2015-09-12 MED ORDER — MIDAZOLAM HCL 2 MG/2ML IJ SOLN
INTRAMUSCULAR | Status: AC
  Filled 2015-09-12: qty 4

## 2015-09-12 MED ORDER — ONDANSETRON HCL 4 MG/2ML IJ SOLN
4.0000 mg | Freq: Four times a day (QID) | INTRAMUSCULAR | Status: AC | PRN
  Administered 2015-09-12: 4 mg via INTRAVENOUS

## 2015-09-12 MED ORDER — ROCURONIUM BROMIDE 100 MG/10ML IV SOLN
INTRAVENOUS | Status: DC | PRN
  Administered 2015-09-12: 15 mg via INTRAVENOUS
  Administered 2015-09-12: 35 mg via INTRAVENOUS

## 2015-09-12 MED ORDER — MIDAZOLAM HCL 5 MG/5ML IJ SOLN
INTRAMUSCULAR | Status: DC | PRN
  Administered 2015-09-12: 1 mg via INTRAVENOUS

## 2015-09-12 MED ORDER — FENTANYL CITRATE (PF) 100 MCG/2ML IJ SOLN
INTRAMUSCULAR | Status: DC | PRN
  Administered 2015-09-12 (×3): 50 ug via INTRAVENOUS

## 2015-09-12 MED ORDER — GLYCOPYRROLATE 0.2 MG/ML IJ SOLN
INTRAMUSCULAR | Status: AC
  Filled 2015-09-12: qty 3

## 2015-09-12 MED ORDER — NEOSTIGMINE METHYLSULFATE 10 MG/10ML IV SOLN
INTRAVENOUS | Status: AC
  Filled 2015-09-12: qty 1

## 2015-09-12 MED ORDER — BUPIVACAINE-EPINEPHRINE (PF) 0.25% -1:200000 IJ SOLN
INTRAMUSCULAR | Status: AC
  Filled 2015-09-12: qty 30

## 2015-09-12 MED ORDER — HYDROMORPHONE HCL 1 MG/ML IJ SOLN
INTRAMUSCULAR | Status: AC
  Filled 2015-09-12: qty 1

## 2015-09-12 MED ORDER — ALBUTEROL SULFATE (2.5 MG/3ML) 0.083% IN NEBU
INHALATION_SOLUTION | RESPIRATORY_TRACT | Status: AC
  Filled 2015-09-12: qty 3

## 2015-09-12 MED ORDER — LIDOCAINE HCL (CARDIAC) 20 MG/ML IV SOLN
INTRAVENOUS | Status: DC | PRN
  Administered 2015-09-12: 70 mg via INTRAVENOUS

## 2015-09-12 SURGICAL SUPPLY — 46 items
BINDER ABDOMINAL 12 ML 46-62 (SOFTGOODS) ×3 IMPLANT
BLADE SURG 10 STRL SS (BLADE) ×3 IMPLANT
BLADE SURG 15 STRL LF DISP TIS (BLADE) ×1 IMPLANT
BLADE SURG 15 STRL SS (BLADE) ×2
CANISTER SUCTION 2500CC (MISCELLANEOUS) ×3 IMPLANT
CHLORAPREP W/TINT 26ML (MISCELLANEOUS) ×3 IMPLANT
COVER SURGICAL LIGHT HANDLE (MISCELLANEOUS) ×3 IMPLANT
DRAPE LAPAROSCOPIC ABDOMINAL (DRAPES) ×3 IMPLANT
DRAPE UTILITY XL STRL (DRAPES) ×6 IMPLANT
ELECT CAUTERY BLADE 6.4 (BLADE) ×3 IMPLANT
ELECT REM PT RETURN 9FT ADLT (ELECTROSURGICAL) ×3
ELECTRODE REM PT RTRN 9FT ADLT (ELECTROSURGICAL) ×1 IMPLANT
GAUZE SPONGE 4X4 12PLY STRL (GAUZE/BANDAGES/DRESSINGS) IMPLANT
GLOVE BIO SURGEON STRL SZ8 (GLOVE) ×3 IMPLANT
GLOVE BIO SURGEON STRL SZ8.5 (GLOVE) ×3 IMPLANT
GLOVE BIOGEL PI IND STRL 7.5 (GLOVE) ×1 IMPLANT
GLOVE BIOGEL PI IND STRL 8 (GLOVE) ×1 IMPLANT
GLOVE BIOGEL PI IND STRL 9 (GLOVE) ×1 IMPLANT
GLOVE BIOGEL PI INDICATOR 7.5 (GLOVE) ×2
GLOVE BIOGEL PI INDICATOR 8 (GLOVE) ×2
GLOVE BIOGEL PI INDICATOR 9 (GLOVE) ×2
GOWN STRL REUS W/ TWL LRG LVL3 (GOWN DISPOSABLE) ×1 IMPLANT
GOWN STRL REUS W/ TWL XL LVL3 (GOWN DISPOSABLE) ×1 IMPLANT
GOWN STRL REUS W/TWL LRG LVL3 (GOWN DISPOSABLE) ×2
GOWN STRL REUS W/TWL XL LVL3 (GOWN DISPOSABLE) ×2
KIT BASIN OR (CUSTOM PROCEDURE TRAY) ×3 IMPLANT
KIT ROOM TURNOVER OR (KITS) ×3 IMPLANT
LIQUID BAND (GAUZE/BANDAGES/DRESSINGS) ×3 IMPLANT
MESH VENTRALEX ST 2.5 CRC MED (Mesh General) ×3 IMPLANT
NEEDLE HYPO 25GX1X1/2 BEV (NEEDLE) ×3 IMPLANT
NS IRRIG 1000ML POUR BTL (IV SOLUTION) ×3 IMPLANT
PACK SURGICAL SETUP 50X90 (CUSTOM PROCEDURE TRAY) ×3 IMPLANT
PAD ARMBOARD 7.5X6 YLW CONV (MISCELLANEOUS) ×3 IMPLANT
PENCIL BUTTON HOLSTER BLD 10FT (ELECTRODE) ×3 IMPLANT
SPONGE LAP 18X18 X RAY DECT (DISPOSABLE) ×3 IMPLANT
SUT MON AB 4-0 PC3 18 (SUTURE) ×3 IMPLANT
SUT NOVA NAB DX-16 0-1 5-0 T12 (SUTURE) ×3 IMPLANT
SUT VIC AB 3-0 SH 27 (SUTURE) ×2
SUT VIC AB 3-0 SH 27X BRD (SUTURE) ×1 IMPLANT
SYR BULB IRRIGATION 50ML (SYRINGE) ×3 IMPLANT
SYR CONTROL 10ML LL (SYRINGE) ×3 IMPLANT
TOWEL OR 17X24 6PK STRL BLUE (TOWEL DISPOSABLE) ×3 IMPLANT
TOWEL OR 17X26 10 PK STRL BLUE (TOWEL DISPOSABLE) ×3 IMPLANT
TUBE CONNECTING 12'X1/4 (SUCTIONS) ×1
TUBE CONNECTING 12X1/4 (SUCTIONS) ×2 IMPLANT
YANKAUER SUCT BULB TIP NO VENT (SUCTIONS) ×3 IMPLANT

## 2015-09-12 NOTE — Op Note (Signed)
Preoperative diagnosis: Incisional hernia incarcerated nonobstructed  Postoperative diagnosis: 3 cm incisional hernia with incarcerated omentum without bowel obstruction  Procedure: Repair of incisional hernia with mesh  Surgeon: Erroll Luna M.D.  Anesthesia: Gen. endotracheal anesthesia with 0.25% Sensorcaine local  EBL: Minimal  Specimens: None  Indications for procedure: The patient is a 69 year old female with a previous lower abdominal incision. She developed a hernia just at the level of her umbilicus involving the superior aspect of her incision. We discussed options of repair versus observation. Discussed repair with mesh and laparoscopic repair with mesh. Discussed the pros and cons of surgery and the pathophysiology of hernias. We discussed her postoperative care and expected recovery. We discussed the possibility of complication and potential bowel injury if adhesions had to be taken down which would be expected. She understood the above and wish to proceed.The risk of hernia repair include bleeding,  Infection,   Recurrence of the hernia,  Mesh use, chronic pain,  Organ injury,  Bowel injury,  Bladder injury,   nerve injury with numbness around the incision,  Death,  and worsening of preexisting  medical problems.  The alternatives to surgery have been discussed as well..  Long term expectations of both operative and non operative treatments have been discussed.   The patient agrees to proceed.  Description of procedure: The patient was met in the holding area and questions are answered. She was taken back to the operating room and placed supine on the OR table. After induction of general endotracheal anesthesia, the abdomen was prepped and draped in a sterile. Hernia was at the level of the umbilicus evolving the superior aspect of her lower midline incision. Timeout was done to verify proper patient and procedure. She received 3 g of Ancef. Curvilinear incision was made on the upper  portion of the incision at the base of the umbilicus. The skin was elevated and the hernia was entered. There is some omentum but no bowel. Hernia defect was dissected out circumferentially with approximately 3 centimeters in maximal diameter. Omental adhesions were taken away from the undersurface the fascia. A 6.3 x 6.3 cm circular ventral light mesh with a coated surface was used this was placed underneath the fascia. This was secured to the fascia with #1 Novafil suture circumferentially. There in the mesh. The fascia was then closed after close inspection of the mesh and ensuring adequate hemostasis. There is no evidence of bowel injury during the dissection. Fascia was closed with #1 Novafil suture. Subcutaneous tissues and umbilicus secured to the fascia with 3-0 Vicryl. 4-0 Monocryl used to close the skin a subcuticular fashion. Liquid these applied an abdominal binder placed. All final counts are found to be correct this portion case. Patient was awoke, extubated taken recovery in satisfactory condition

## 2015-09-12 NOTE — Discharge Instructions (Signed)
[facility] _______[facility] ° °UMBILICAL OR INGUINAL HERNIA REPAIR: POST OP INSTRUCTIONS ° °Always review your discharge instruction sheet given to you by the facility where your surgery was performed. °IF YOU HAVE DISABILITY OR FAMILY LEAVE FORMS, YOU MUST BRING THEM TO THE OFFICE FOR PROCESSING.   °DO NOT GIVE THEM TO YOUR DOCTOR. ° °1. A  prescription for pain medication may be given to you upon discharge.  Take your pain medication as prescribed, if needed.  If narcotic pain medicine is not needed, then you may take acetaminophen (Tylenol) or ibuprofen (Advil) as needed. °2. Take your usually prescribed medications unless otherwise directed. °3. If you need a refill on your pain medication, please contact your pharmacy.  They will contact our office to request authorization. Prescriptions will not be filled after 5 pm or on week-ends. °4. You should follow a light diet the first 24 hours after arrival home, such as soup and crackers, etc.  Be sure to include lots of fluids daily.  Resume your normal diet the day after surgery. °5. Most patients will experience some swelling and bruising around the umbilicus or in the groin and scrotum.  Ice packs and reclining will help.  Swelling and bruising can take several days to resolve.  °6. It is common to experience some constipation if taking pain medication after surgery.  Increasing fluid intake and taking a stool softener (such as Colace) will usually help or prevent this problem from occurring.  A mild laxative (Milk of Magnesia or Miralax) should be taken according to package directions if there are no bowel movements after 48 hours. °7. Unless discharge instructions indicate otherwise, you may remove your bandages 24-48 hours after surgery, and you may shower at that time.  You may have steri-strips (small skin tapes) in place directly over the incision.  These strips should be left on the skin for 7-10 days.  If your surgeon used skin glue on the  incision, you may shower in 24 hours.  The glue will flake off over the next 2-3 weeks.  Any sutures or staples will be removed at the office during your follow-up visit. °8. ACTIVITIES:  You may resume regular (light) daily activities beginning the next day--such as daily self-care, walking, climbing stairs--gradually increasing activities as tolerated.  You may have sexual intercourse when it is comfortable.  Refrain from any heavy lifting or straining until approved by your doctor. °a. You may drive when you are no longer taking prescription pain medication, you can comfortably wear a seatbelt, and you can safely maneuver your car and apply brakes. °b. RETURN TO WORK:  __________________________________________________________ °9. You should see your doctor in the office for a follow-up appointment approximately 2-3 weeks after your surgery.  Make sure that you call for this appointment within a day or two after you arrive home to insure a convenient appointment time. °10. OTHER INSTRUCTIONS:  __________________________________________________________________________________________________________________________________________________________________________________________  °WHEN TO CALL YOUR DOCTOR: °1. Fever over 101.0 °2. Inability to urinate °3. Nausea and/or vomiting °4. Extreme swelling or bruising °5. Continued bleeding from incision. °6. Increased pain, redness, or drainage from the incision ° °The clinic staff is available to answer your questions during regular business hours.  Please don’t hesitate to call and ask to speak to one of the nurses for clinical concerns.  If you have a medical emergency, go to the nearest emergency room or call 911.  A surgeon from [facility] is always on call at the hospital ° ° °  [address], [place], [place], [place]  [address] ?  [address], [place],    [address] [phone] ? [phone] ? FAX [phone] Web site:  [email or web address]    Wear binder can remove to shower

## 2015-09-12 NOTE — Transfer of Care (Signed)
Immediate Anesthesia Transfer of Care Note  Patient: Kristen Howe  Procedure(s) Performed: Procedure(s): REPAIR OF INCISIONAL AND UMBILICAL HERNIA (N/A) INSERTION OF MESH (N/A)  Patient Location: PACU  Anesthesia Type:General  Level of Consciousness: awake, oriented and patient cooperative  Airway & Oxygen Therapy: Patient Spontanous Breathing and Patient connected to nasal cannula oxygen  Post-op Assessment: Report given to RN, Post -op Vital signs reviewed and stable and Patient moving all extremities  Post vital signs: Reviewed and stable  Last Vitals:  Filed Vitals:   09/12/15 0846  BP: 123/55  Pulse:   Temp:   Resp:     Complications: No apparent anesthesia complications

## 2015-09-12 NOTE — Interval H&P Note (Signed)
History and Physical Interval Note:  09/12/2015 7:12 AM  Kristen Howe  has presented today for surgery, with the diagnosis of Umbilical Hernia  The various methods of treatment have been discussed with the patient and family. After consideration of risks, benefits and other options for treatment, the patient has consented to  Procedure(s): REPAIR OF INCISIONAL AND UMBILICAL HERNIA (N/A) INSERTION OF MESH (N/A) as a surgical intervention .  The patient's history has been reviewed, patient examined, no change in status, stable for surgery.  I have reviewed the patient's chart and labs.  Questions were answered to the patient's satisfaction.     Jakyron Fabro A.

## 2015-09-12 NOTE — Progress Notes (Signed)
Much brighter, interactive and off O2 after oob chair and albuterol neb x 1, tolerating po flds and ice chips

## 2015-09-12 NOTE — Anesthesia Postprocedure Evaluation (Signed)
Anesthesia Post Note  Patient: Kristen Howe  Procedure(s) Performed: Procedure(s) (LRB): REPAIR OF INCISIONAL AND UMBILICAL HERNIA (N/A) INSERTION OF MESH (N/A)  Anesthesia type: General  Patient location: PACU  Post pain: Pain level controlled and Adequate analgesia  Post assessment: Post-op Vital signs reviewed, Patient's Cardiovascular Status Stable, Respiratory Function Stable, Patent Airway and Pain level controlled  Last Vitals:  Filed Vitals:   09/12/15 0930  BP: 115/63  Pulse: 56  Temp:   Resp: 18    Post vital signs: Reviewed and stable  Level of consciousness: awake, alert  and oriented  Complications: No apparent anesthesia complications

## 2015-09-12 NOTE — Anesthesia Procedure Notes (Signed)
Procedure Name: Intubation Date/Time: 09/12/2015 7:30 AM Performed by: Terrill Mohr Pre-anesthesia Checklist: Patient identified, Emergency Drugs available, Suction available and Patient being monitored Patient Re-evaluated:Patient Re-evaluated prior to inductionOxygen Delivery Method: Circle system utilized Preoxygenation: Pre-oxygenation with 100% oxygen Intubation Type: IV induction Ventilation: Mask ventilation without difficulty Laryngoscope Size: Mac and 3 Grade View: Grade II Tube type: Oral Tube size: 7.5 mm Airway Equipment and Method: Stylet Placement Confirmation: ETT inserted through vocal cords under direct vision,  breath sounds checked- equal and bilateral and positive ETCO2 Secured at: 21 (cm at upper gum) cm Tube secured with: Tape Dental Injury: Teeth and Oropharynx as per pre-operative assessment

## 2015-09-12 NOTE — Anesthesia Preprocedure Evaluation (Addendum)
Anesthesia Evaluation  Patient identified by MRN, date of birth, ID band Patient awake    Reviewed: Allergy & Precautions, NPO status , Patient's Chart, lab work & pertinent test results  Airway Mallampati: II  TM Distance: >3 FB Neck ROM: full    Dental  (+) Edentulous Upper, Missing, Partial Lower, Dental Advisory Given   Pulmonary former smoker,    breath sounds clear to auscultation       Cardiovascular hypertension, Pt. on medications  Rhythm:regular Rate:Normal     Neuro/Psych  Headaches,    GI/Hepatic GERD  Controlled and Medicated,  Endo/Other    Renal/GU      Musculoskeletal  (+) Arthritis ,   Abdominal   Peds  Hematology   Anesthesia Other Findings Has dental posts in upper gum  Reproductive/Obstetrics                            Anesthesia Physical Anesthesia Plan  ASA: II  Anesthesia Plan: General   Post-op Pain Management:    Induction: Intravenous  Airway Management Planned: Oral ETT  Additional Equipment:   Intra-op Plan:   Post-operative Plan: Extubation in OR  Informed Consent: I have reviewed the patients History and Physical, chart, labs and discussed the procedure including the risks, benefits and alternatives for the proposed anesthesia with the patient or authorized representative who has indicated his/her understanding and acceptance.     Plan Discussed with: CRNA, Anesthesiologist and Surgeon  Anesthesia Plan Comments:         Anesthesia Quick Evaluation

## 2015-09-12 NOTE — H&P (View-Only) (Signed)
Kristen Howe 08/18/2015 8:51 AM Location: [facility] Patient #: [id number] DOB: 18-Nov-1946 Divorced / Language: [name] / Race: White Female History of Present Illness Marcello Moores A. Henlee Donovan MD; 08/18/2015 9:20 AM) Patient words: hernia  Pt sent at the request of Mauricio Po FNP for umbilical/incisional hernia. It has been present for many years. It causes mild to moderate pain. It has stuck out for years she states. No change in bowel or bladder function. She has had a C section and incision extends to umbilicus where her hernia is located.  The patient is a 69 year old female   Other Problems [name], [place]; 08/18/2015 8:51 AM) Arthritis Back Pain Gastroesophageal Reflux Disease High blood pressure Hypercholesterolemia  Past Surgical History [name], [name]; 08/18/2015 8:51 AM) Cataract Surgery Bilateral. Cesarean Section - Multiple Oral Surgery Tonsillectomy  Diagnostic Studies History [name], [name]; 08/18/2015 8:51 AM) Colonoscopy never Mammogram >3 years ago Pap Smear >5 years ago  Allergies [name], [name]; 08/18/2015 8:52 AM) No Known Drug Allergies 08/18/2015  Medication History ([name], [name]; 08/18/2015 8:53 AM) Gemfibrozil (600MG  Tablet, Oral) Active. Gentamicin Sulfate (0.3% Solution, Ophthalmic) Active. Losartan Potassium-HCTZ (50-12.5MG  Tablet, Oral) Active. Durezol (0.05% Emulsion, Ophthalmic) Active. Medications Reconciled  Social History [name], [name]; 08/18/2015 8:51 AM) Alcohol use Moderate alcohol use. Caffeine use Coffee. No drug use Tobacco use Current every day smoker.  Family History [name], [place]; 08/18/2015 8:51 AM) Breast Cancer Sister. Hypertension Mother.  Pregnancy / Birth History [name], [place]; 08/18/2015 8:52 AM) Age at menarche 30 years. Age of menopause 51-55 Contraceptive History Oral contraceptives. Gravida 2 Maternal age 28-30 Para 2 Regular periods     Review of  Systems [name] [name]; 08/18/2015 8:52 AM) General Not Present- Appetite Loss, Chills, Fatigue, Fever, Night Sweats, Weight Gain and Weight Loss. Skin Present- Dryness. Not Present- Change in Wart/Mole, Hives, Jaundice, New Lesions, Non-Healing Wounds, Rash and Ulcer. HEENT Present- Sinus Pain. Not Present- Earache, Hearing Loss, Hoarseness, Nose Bleed, Oral Ulcers, Ringing in the Ears, Seasonal Allergies, Sore Throat, Visual Disturbances, Wears glasses/contact lenses and Yellow Eyes. Respiratory Present- Snoring. Not Present- Bloody sputum, Chronic Cough, Difficulty Breathing and Wheezing. Breast Not Present- Breast Mass, Breast Pain, Nipple Discharge and Skin Changes. Cardiovascular Present- Leg Cramps and Swelling of Extremities. Not Present- Chest Pain, Difficulty Breathing Lying Down, Palpitations, Rapid Heart Rate and Shortness of Breath. Gastrointestinal Present- Indigestion and Nausea. Not Present- Abdominal Pain, Bloating, Bloody Stool, Change in Bowel Habits, Chronic diarrhea, Constipation, Difficulty Swallowing, Excessive gas, Gets full quickly at meals, Hemorrhoids, Rectal Pain and Vomiting. Female Genitourinary Not Present- Frequency, Nocturia, Painful Urination, Pelvic Pain and Urgency. Musculoskeletal Present- Back Pain and Joint Stiffness. Not Present- Joint Pain, Muscle Pain, Muscle Weakness and Swelling of Extremities. Neurological Not Present- Decreased Memory, Fainting, Headaches, Numbness, Seizures, Tingling, Tremor, Trouble walking and Weakness. Psychiatric Not Present- Anxiety, Bipolar, Change in Sleep Pattern, Depression, Fearful and Frequent crying. Endocrine Not Present- Cold Intolerance, Excessive Hunger, Hair Changes, Heat Intolerance, Hot flashes and New Diabetes. Hematology Not Present- Easy Bruising, Excessive bleeding, Gland problems, HIV and Persistent Infections.  Vitals ([name] [name]; 08/18/2015 8:52 AM) 08/18/2015 8:52 AM Weight: 155 lb Height: 60in Body  Surface Area: 1.73 m Body Mass Index: 30.27 kg/m Temp.: 83F(Temporal)  Pulse: 79 (Regular)  BP: 126/80 (Sitting, Left Arm, Standard)     Physical Exam (Rylynn Schoneman A. Klein Willcox MD; 08/18/2015 9:21 AM)  General Mental Status-Alert. General Appearance-Consistent with stated age. Hydration-Well hydrated. Voice-Normal.  Eye Note: right eye strabismus  Chest and Lung Exam Chest and lung exam reveals -quiet, even and easy respiratory effort with no use of accessory muscles and on auscultation, normal breath sounds, no adventitious sounds and normal vocal resonance. Inspection Chest Wall - Normal. Back - normal.  Cardiovascular Cardiovascular examination reveals -normal heart sounds, regular rate and rhythm with no murmurs and normal pedal pulses bilaterally.  Abdomen Note: incarcerted 5 cm umbilical/ uper incisional hernia not tender mild chronic skin changes    Neurologic Neurologic evaluation reveals -alert and oriented x 3 with no impairment of recent or remote memory. Mental Status-Normal.  Musculoskeletal Normal Exam - Left-Upper Extremity Strength Normal and Lower Extremity Strength Normal. Normal Exam - Right-Upper Extremity Strength Normal and Lower Extremity Strength Normal.    Assessment & Plan (Kye Hedden A. Philo Kurtz MD; 08/18/2015 9:22 AM)  INCARCERATED UMBILICAL HERNIA (629.4  K42.0) Impression: discussd repair vs observation and pro and cons of each. She desires repair. The risk of hernia repair include bleeding, infection, organ injury, bowel injury, bladder injury, nerve injury recurrent hernia, blood clots, worsening of underlying condition, chronic pain, mesh use, open surgery, death, and the need for other operattions. Pt agrees to proceed  Current Plans You are being scheduled for surgery - Our schedulers will call you.  You should hear from our office's scheduling department within 5 working days about the location, date, and time of  surgery. We try to make accommodations for patient's preferences in scheduling surgery, but sometimes the OR schedule or the surgeon's schedule prevents [place] from making those accommodations.  If you have not heard from our office [phone]) in 5 working days, call the office and ask for your surgeon's nurse.  If you have other questions about your diagnosis, plan, or surgery, call the office and ask for your surgeon's nurse. Written instructions provided   The anatomy & physiology of the abdominal wall was discussed. The pathophysiology of hernias was discussed. Natural history risks without surgery including progeressive enlargement, pain, incarceration, & strangulation was discussed. Contributors to complications such as smoking, obesity, diabetes, prior surgery, etc were discussed.  I feel the risks of no intervention will lead to serious problems that outweigh the operative risks; therefore, I recommended surgery to reduce and repair the hernia. I explained laparoscopic techniques with possible need for an open approach. I noted the probable use of mesh to patch and/or buttress the hernia repair  Risks such as bleeding, infection, abscess, need for further treatment, heart attack, death, and other risks were discussed. I noted a good likelihood this will help address the problem. Goals of post-operative recovery were discussed as well. Possibility that this will not correct all symptoms was explained. I stressed the importance of low-impact activity, aggressive pain control, avoiding constipation, & not pushing through pain to minimize risk of post-operative chronic pain or injury. Possibility of reherniation especially with smoking, obesity, diabetes, immunosuppression, and other health conditions was discussed. We will work to minimize complications.  An educational handout further explaining the pathology & treatment options was given as well. Questions were answered. The patient expresses  understanding & wishes to proceed with surgery. Pt Education - [facility] Hernia Post-Op HCI (Gross): discussed with patient and provided information. Pt Education - [facility] Pain Control (Gross) Pt Education - Pamphlet Given - Hernia Surgery: discussed with patient and provided information. Pt Education - Overview of abdominal wall hernias in adults: discussed with patient and provided information. INCISIONAL HERNIA, WITHOUT OBSTRUCTION OR GANGRENE (553.21  K43.2)

## 2015-09-13 ENCOUNTER — Encounter (HOSPITAL_COMMUNITY): Payer: Self-pay | Admitting: Surgery

## 2015-11-15 ENCOUNTER — Telehealth: Payer: Self-pay | Admitting: Family

## 2015-11-15 NOTE — Telephone Encounter (Signed)
Pt states she needs a referral for a mammogram. She wants to go to [facility] She states she has a lump on her breast Can you please call her at [phone]

## 2015-11-16 DIAGNOSIS — L304 Erythema intertrigo: Secondary | ICD-10-CM | POA: Diagnosis not present

## 2015-11-16 DIAGNOSIS — L820 Inflamed seborrheic keratosis: Secondary | ICD-10-CM | POA: Diagnosis not present

## 2015-11-17 NOTE — Telephone Encounter (Signed)
Called pt back. Scheduled and OV to come in to be checked out first.

## 2015-11-20 ENCOUNTER — Ambulatory Visit (INDEPENDENT_AMBULATORY_CARE_PROVIDER_SITE_OTHER): Payer: Medicare Other | Admitting: Family

## 2015-11-20 ENCOUNTER — Encounter: Payer: Self-pay | Admitting: Family

## 2015-11-20 VITALS — BP 168/90 | HR 86 | Temp 98.3°F | Resp 18 | Ht 60.0 in | Wt 159.8 lb

## 2015-11-20 DIAGNOSIS — E781 Pure hyperglyceridemia: Secondary | ICD-10-CM | POA: Diagnosis not present

## 2015-11-20 DIAGNOSIS — N63 Unspecified lump in unspecified breast: Secondary | ICD-10-CM | POA: Insufficient documentation

## 2015-11-20 MED ORDER — GEMFIBROZIL 600 MG PO TABS: 600.0000 mg | ORAL_TABLET | Freq: Two times a day (BID) | ORAL | Status: DC

## 2015-11-20 MED ORDER — PANTOPRAZOLE SODIUM 40 MG PO TBEC: 40.0000 mg | DELAYED_RELEASE_TABLET | Freq: Every day | ORAL | Status: DC

## 2015-11-20 NOTE — Progress Notes (Signed)
Pre visit review using our clinic review tool, if applicable. No additional management support is needed unless otherwise documented below in the visit note.  Refused flu shot 

## 2015-11-20 NOTE — Assessment & Plan Note (Signed)
Firm nodule/mass located in the right breast. Obtain diagnostic mammogram and ultrasound if necessary. Continue to monitor at this time. Follow up pending mammogram results.

## 2015-11-20 NOTE — Patient Instructions (Signed)
Thank you for choosing [facility].  Summary/Instructions:   If your symptoms worsen or fail to improve, please contact our office for further instruction, or in case of emergency go directly to the emergency room at the closest medical facility.   Mammogram A mammogram is an X-ray of the breasts that is done to check for abnormal changes. This procedure can screen for and detect any changes that may suggest breast cancer. A mammogram can also identify other changes and variations in the breast, such as:  Inflammation of the breast tissue (mastitis).  An infected area that contains a collection of pus (abscess).  A fluid-filled sac (cyst).  Fibrocystic changes. This is when breast tissue becomes denser, which can make the tissue feel rope-like or uneven under the skin.  Tumors that are not cancerous (benign). LET [facility] CARE PROVIDER KNOW ABOUT:  Any allergies you have.  If you have breast implants.  If you have had previous breast disease, biopsy, or surgery.  If you are breastfeeding.  Any possibility that you could be pregnant, if this applies.  If you are younger than age 18.  If you have a family history of breast cancer. RISKS AND COMPLICATIONS Generally, this is a safe procedure. However, problems may occur, including:  Exposure to radiation. Radiation levels are very low with this test.  The results being misinterpreted.  The need for further tests.  The inability of the mammogram to detect certain cancers. BEFORE THE PROCEDURE  Schedule your test about 1-2 weeks after your menstrual period. This is usually when your breasts are the least tender.  If you have had a mammogram done at a different facility in the past, get the mammogram X-rays or have them sent to your current exam facility in order to compare them.  Wash your breasts and under your arms the day of the test.  Do not wear deodorants, perfumes, lotions, or powders anywhere on your  body on the day of the test.  Remove any jewelry from your neck.  Wear clothes that you can change into and out of easily. PROCEDURE  You will undress from the waist up and put on a gown.  You will stand in front of the X-ray machine.  Each breast will be placed between two plastic or glass plates. The plates will compress your breast for a few seconds. Try to stay as relaxed as possible during the procedure. This does not cause any harm to your breasts and any discomfort you feel will be very brief.  X-rays will be taken from different angles of each breast. The procedure may vary among health care providers and hospitals. AFTER THE PROCEDURE  The mammogram will be examined by a specialist (radiologist).  You may need to repeat certain parts of the test, depending on the quality of the images. This is commonly done if the radiologist needs a better view of the breast tissue.  Ask when your test results will be ready. Make sure you get your test results.  You may resume your normal activities.   This information is not intended to replace advice given to you by your health care provider. Make sure you discuss any questions you have with your health care provider.   Document Released: 11/29/2000 Document Revised: 08/23/2015 Document Reviewed: 02/10/2015 Elsevier Interactive Patient Education [employer].

## 2015-11-20 NOTE — Progress Notes (Signed)
   Subjective:    Patient ID: Kristen Howe, female    DOB: [birth year], 69 y.o.   MRN: [id number]  Chief Complaint  Patient presents with  . Breast Mass    has a lump on right breast that she noticed a week ago     HPI:  Kristen Howe is a 69 y.o. female who  has a past medical history of Hypertension; Hyperlipidemia; Pneumonia; GERD (gastroesophageal reflux disease); Headache; Arthritis; Leg cramps; and Anemia. and presents today for an acute office visit.   1.) Lump in breast - Associated symptom of a lump in her right breast has been going on for about 1 week. The lump is located on the lower lateral aspect around the 8 o'clock position. Denies any modifying factors or changes in size. There is no tenderness. Last mammogram was done a long time ago. Dscribed as a little ball. Denies any nipple discharge or changes in skin.   No Known Allergies   Current Outpatient Prescriptions on File Prior to Visit  Medication Sig Dispense Refill  . Calcium-Phosphorus-Vitamin D (CITRACAL +D3 PO) Take 2 tablets by mouth 2 (two) times daily.     . cyclobenzaprine (FLEXERIL) 10 MG tablet Take 1 tablet (10 mg total) by mouth 3 (three) times daily as needed for muscle spasms. 30 tablet 0  . losartan-hydrochlorothiazide (HYZAAR) 50-12.5 MG per tablet Take 1 tablet by mouth daily. 90 tablet 1  . Multiple Vitamins-Minerals (MULTIVITAMIN WITH MINERALS) tablet Take 1 tablet by mouth daily.    Marland Kitchen oxyCODONE-acetaminophen (ROXICET) 5-325 MG per tablet Take 1-2 tablets by mouth every 4 (four) hours as needed. 30 tablet 0   No current facility-administered medications on file prior to visit.    Review of Systems  Constitutional: Negative for fever and chills.  Respiratory:       Positive for a lump in the right breast.      Objective:    BP 168/90 mmHg  Pulse 86  Temp(Src) 98.3 F (36.8 C) (Oral)  Resp 18  Ht 5' (1.524 m)  Wt 159 lb 12.8 oz (72.485 kg)  BMI 31.21 kg/m2  SpO2 95% Nursing note and  vital signs reviewed.  Physical Exam  Constitutional: She is oriented to person, place, and time. She appears well-developed and well-nourished. No distress.  Cardiovascular: Normal rate, regular rhythm, normal heart sounds and intact distal pulses.   Pulmonary/Chest: Effort normal and breath sounds normal.    Neurological: She is alert and oriented to person, place, and time.  Skin: Skin is warm and dry.  Psychiatric: She has a normal mood and affect. Her behavior is normal. Judgment and thought content normal.       Assessment & Plan:   Problem List Items Addressed This Visit      Other   Lump in female breast - Primary    Firm nodule/mass located in the right breast. Obtain diagnostic mammogram and ultrasound if necessary. Continue to monitor at this time. Follow up pending mammogram results.       Relevant Orders   MM Digital Diagnostic Bilat   US BREAST COMPLETE UNI RIGHT INC AXILLA    Other Visit Diagnoses    Hypertriglyceridemia        Relevant Medications    gemfibrozil (LOPID) 600 MG tablet

## 2015-11-29 ENCOUNTER — Other Ambulatory Visit: Payer: Self-pay | Admitting: Family

## 2015-11-29 ENCOUNTER — Ambulatory Visit
Admission: RE | Admit: 2015-11-29 | Discharge: 2015-11-29 | Disposition: A | Discharge status: Home / Self Care | Payer: Medicare Other | Source: Ambulatory Visit | Attending: Family | Admitting: Family

## 2015-11-29 DIAGNOSIS — N63 Unspecified lump in unspecified breast: Secondary | ICD-10-CM

## 2015-12-12 ENCOUNTER — Other Ambulatory Visit: Payer: Self-pay | Admitting: Family

## 2015-12-12 ENCOUNTER — Ambulatory Visit
Admission: RE | Admit: 2015-12-12 | Discharge: 2015-12-12 | Disposition: A | Discharge status: Home / Self Care | Payer: Medicare Other | Source: Ambulatory Visit | Attending: Family | Admitting: Family

## 2015-12-12 DIAGNOSIS — N63 Unspecified lump in unspecified breast: Secondary | ICD-10-CM

## 2015-12-12 DIAGNOSIS — C773 Secondary and unspecified malignant neoplasm of axilla and upper limb lymph nodes: Secondary | ICD-10-CM | POA: Diagnosis not present

## 2015-12-12 DIAGNOSIS — C50411 Malignant neoplasm of upper-outer quadrant of right female breast: Secondary | ICD-10-CM | POA: Diagnosis not present

## 2015-12-12 DIAGNOSIS — C50212 Malignant neoplasm of upper-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 breast: Secondary | ICD-10-CM | POA: Diagnosis not present

## 2015-12-12 DIAGNOSIS — C50012 Malignant neoplasm of nipple and areola, left female breast: Secondary | ICD-10-CM | POA: Diagnosis not present

## 2015-12-14 ENCOUNTER — Telehealth: Payer: Self-pay | Admitting: *Deleted

## 2015-12-14 ENCOUNTER — Encounter: Payer: Self-pay | Admitting: *Deleted

## 2015-12-14 DIAGNOSIS — Z170 Estrogen receptor positive status [ER+]: Secondary | ICD-10-CM

## 2015-12-14 DIAGNOSIS — C50212 Malignant neoplasm of upper-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 breast: Secondary | ICD-10-CM

## 2015-12-14 DIAGNOSIS — C50411 Malignant neoplasm of upper-outer quadrant of right female breast: Secondary | ICD-10-CM | POA: Insufficient documentation

## 2015-12-14 HISTORY — DX: Malignant neoplasm of upper-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 breast: C50.212

## 2015-12-14 NOTE — Telephone Encounter (Signed)
Confirmed [facility] for 12/20/15 at 1230 .  Instructions and contact information given.

## 2015-12-15 ENCOUNTER — Telehealth: Payer: Self-pay | Admitting: *Deleted

## 2015-12-15 NOTE — Telephone Encounter (Signed)
Mailed clinic packet to pt.  

## 2015-12-20 ENCOUNTER — Ambulatory Visit: Payer: Medicare Other | Attending: General Surgery | Admitting: Physical Therapy

## 2015-12-20 ENCOUNTER — Encounter: Payer: Self-pay | Admitting: Physical Therapy

## 2015-12-20 ENCOUNTER — Other Ambulatory Visit (HOSPITAL_BASED_OUTPATIENT_CLINIC_OR_DEPARTMENT_OTHER): Payer: Medicare Other

## 2015-12-20 ENCOUNTER — Ambulatory Visit (HOSPITAL_BASED_OUTPATIENT_CLINIC_OR_DEPARTMENT_OTHER): Payer: Medicare Other | Admitting: Oncology

## 2015-12-20 ENCOUNTER — Ambulatory Visit
Admission: RE | Admit: 2015-12-20 | Discharge: 2015-12-20 | Disposition: A | Discharge status: Home / Self Care | Payer: Medicare Other | Source: Ambulatory Visit | Attending: Radiation Oncology | Admitting: Radiation Oncology

## 2015-12-20 ENCOUNTER — Other Ambulatory Visit: Payer: Self-pay | Admitting: General Surgery

## 2015-12-20 ENCOUNTER — Encounter: Payer: Self-pay | Admitting: Oncology

## 2015-12-20 VITALS — BP 144/76 | HR 86 | Temp 98.5°F | Resp 18 | Ht 60.0 in | Wt 160.1 lb

## 2015-12-20 DIAGNOSIS — Z720 Tobacco use: Secondary | ICD-10-CM

## 2015-12-20 DIAGNOSIS — C50112 Malignant neoplasm of central portion of left female breast: Secondary | ICD-10-CM

## 2015-12-20 DIAGNOSIS — C50212 Malignant neoplasm of upper-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 breast: Secondary | ICD-10-CM

## 2015-12-20 DIAGNOSIS — C50411 Malignant neoplasm of upper-outer quadrant of right female breast: Secondary | ICD-10-CM | POA: Diagnosis not present

## 2015-12-20 DIAGNOSIS — Z807 Family history of other malignant neoplasms of lymphoid, hematopoietic and related tissues: Secondary | ICD-10-CM

## 2015-12-20 DIAGNOSIS — C50412 Malignant neoplasm of upper-outer quadrant of left female breast: Secondary | ICD-10-CM | POA: Diagnosis not present

## 2015-12-20 DIAGNOSIS — Z803 Family history of malignant neoplasm of breast: Secondary | ICD-10-CM | POA: Diagnosis not present

## 2015-12-20 DIAGNOSIS — C773 Secondary and unspecified malignant neoplasm of axilla and upper limb lymph nodes: Secondary | ICD-10-CM | POA: Diagnosis not present

## 2015-12-20 DIAGNOSIS — R293 Abnormal posture: Secondary | ICD-10-CM | POA: Insufficient documentation

## 2015-12-20 DIAGNOSIS — M25612 Stiffness of left shoulder, not elsewhere classified: Secondary | ICD-10-CM | POA: Diagnosis not present

## 2015-12-20 LAB — COMPREHENSIVE METABOLIC PANEL
ALT: 20 U/L (ref 0–55)
AST: 19 U/L (ref 5–34)
Albumin: 3.9 g/dL (ref 3.5–5.0)
Alkaline Phosphatase: 88 U/L (ref 40–150)
Anion Gap: 11 mEq/L (ref 3–11)
BUN: 11.7 mg/dL (ref 7.0–26.0)
CALCIUM: 9.6 mg/dL (ref 8.4–10.4)
CHLORIDE: 108 meq/L (ref 98–109)
CO2: 22 mEq/L (ref 22–29)
Creatinine: 0.8 mg/dL (ref 0.6–1.1)
EGFR: 75 mL/min/{1.73_m2} — ABNORMAL LOW (ref >90)
Glucose: 115 mg/dl (ref 70–140)
POTASSIUM: 4 meq/L (ref 3.5–5.1)
SODIUM: 140 meq/L (ref 136–145)
Total Bilirubin: 0.4 mg/dL (ref 0.20–1.20)
Total Protein: 7.3 g/dL (ref 6.4–8.3)

## 2015-12-20 LAB — CBC WITH DIFFERENTIAL/PLATELET
BASO%: 0.8 % (ref 0.0–2.0)
BASOS ABS: 0.1 10*3/uL (ref 0.0–0.1)
EOS%: 0.9 % (ref 0.0–7.0)
Eosinophils Absolute: 0.1 10*3/uL (ref 0.0–0.5)
HEMATOCRIT: 40.8 % (ref 34.8–46.6)
HGB: 13.5 g/dL (ref 11.6–15.9)
LYMPH%: 21.4 % (ref 14.0–49.7)
MCH: 28.3 pg (ref 25.1–34.0)
MCHC: 33.2 g/dL (ref 31.5–36.0)
MCV: 85.2 fL (ref 79.5–101.0)
MONO#: 0.4 10*3/uL (ref 0.1–0.9)
MONO%: 4.9 % (ref 0.0–14.0)
NEUT#: 5.4 10*3/uL (ref 1.5–6.5)
NEUT%: 72 % (ref 38.4–76.8)
Platelets: 292 10*3/uL (ref 145–400)
RBC: 4.78 10*6/uL (ref 3.70–5.45)
RDW: 13.9 % (ref 11.2–14.5)
WBC: 7.6 10*3/uL (ref 3.9–10.3)
lymph#: 1.6 10*3/uL (ref 0.9–3.3)

## 2015-12-20 NOTE — Patient Instructions (Signed)

## 2015-12-20 NOTE — Progress Notes (Signed)
Radiation Oncology         [phone] ________________________________  Name: Kristen Howe MRN: [id number]  Date: 12/20/2015  DOB: [birth year]  VQ:[id number], Belenda Cruise, FNP  Stark Klein, MD     REFERRING PHYSICIAN: Stark Klein, MD   DIAGNOSIS: The primary encounter diagnosis was Breast cancer of upper-outer quadrant of right female breast ([place]). A diagnosis of Primary cancer of 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 breast [facility]) was also pertinent to this visit.   Breast cancer of upper-outer quadrant of right female breast [facility])   Staging form: Breast, AJCC 7th Edition     Clinical stage from 12/20/2015: Stage IA (T1c, N0, M0) - Unsigned       Staging comments: Staged at breast conference on 1.4.17  Primary cancer of 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 breast [facility])   Staging form: Breast, AJCC 7th Edition     Clinical stage from 12/20/2015: Stage IIB (T2, N1, M0) - Unsigned       Staging comments: Staged at breast conference on 1.4.17    [name] is a 70 y.o. female who is seen for an initial consultation visit. She had a mammogram and ultrasound on 11/29/15 for a self palpated mass in the right breast. Found in the right breast 9 o'clock is a suspicious mass, measuring 1.7 x 1.4 x 1.8 cm. The right breast 11 o'clock has a 7 mm indeterminate nodule. She also has right axillary lymphadenopathy with an abnormal lymph node measuring 1.2 x 1.4 x 1.2 cm and several other abnormal lymph nodes with cortical thickening. The left breast 12 o'clock has a multifocal suspicious mass, measuring 3.4 x 4.9 x 6.7 cm on mammogram. On ultrasound, the main component of the mass is 1.3 x 1.1 x 2.1 cm and superior to this is a second mass measuring 1.6 x 0.7 x 1.9 cm. The left breast 1 o'clock has a 8 mm suspicious nodule. Also seen in the left breast is ductal ectasia to the level of the nipple and interspersed suspicious calcifications. She also has associated left axillary  lymphadenopathy. Biopsy on 12/12/15 of the right breast at 9:30 o'clock showed grade II invasive ductal carcinoma (ER pos, PR pos, HER2 neg, Ki67 20%), left breast at 11:30 o'clock showed grade II invasive ductal carcinoma (ER pos, PR pos, HER2 neg, Ki67 20%), a left axillary lymph node showed metastatic carcinoma (ER pos, PR pos, HER2 neg, Ki67 40%), and left breast retroareolar showed ductal carcinoma (ER pos, PR pos, HER2 neg, Ki67 5%). She presents today for Breast Multidisciplinary Clinic.   PREVIOUS RADIATION THERAPY: No   PAST MEDICAL HISTORY:  has a past medical history of Hypertension; Hyperlipidemia; Pneumonia; GERD (gastroesophageal reflux disease); Headache; Arthritis; Leg cramps; Anemia; Primary cancer of 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 breast ([place]) (12/14/2015); and Breast cancer ([place]).     PAST SURGICAL HISTORY: Past Surgical History  Procedure Laterality Date  . Cesarean section    . Eye surgery Bilateral     cataract surgery with lens implant,   . Eye surgery      for cross eyes as a child  . Tonsillectomy    . Umbilical hernia repair N/A 09/12/2015    Procedure: REPAIR OF INCISIONAL AND UMBILICAL HERNIA;  Surgeon: Erroll Luna, MD;  Location: [place];  Service: General;  Laterality: N/A;  . Insertion of mesh N/A 09/12/2015    Procedure: INSERTION OF MESH;  Surgeon: Erroll Luna, MD;  Location: [place];  Service: General;  Laterality: N/A;     FAMILY HISTORY: family history includes Breast cancer in her sister; Healthy in her mother; Lymphoma in her father.   SOCIAL HISTORY:  reports that she has been smoking Cigarettes.  She has a 15 pack-year smoking history. She has never used smokeless tobacco. She reports that she drinks alcohol. She reports that she does not use illicit drugs.   ALLERGIES: Review of patient's allergies indicates no known allergies.   MEDICATIONS:  Current Outpatient Prescriptions  Medication Sig Dispense Refill  .  Calcium-Phosphorus-Vitamin D (CITRACAL +D3 PO) Take 2 tablets by mouth 2 (two) times daily.     . cyclobenzaprine (FLEXERIL) 10 MG tablet Take 1 tablet (10 mg total) by mouth 3 (three) times daily as needed for muscle spasms. (Patient not taking: Reported on 12/20/2015) 30 tablet 0  . gemfibrozil (LOPID) 600 MG tablet Take 1 tablet (600 mg total) by mouth 2 (two) times daily before a meal. 180 tablet 0  . losartan-hydrochlorothiazide (HYZAAR) 50-12.5 MG per tablet Take 1 tablet by mouth daily. 90 tablet 1  . Multiple Vitamins-Minerals (MULTIVITAMIN WITH MINERALS) tablet Take 1 tablet by mouth daily. Reported on 12/20/2015    . oxyCODONE-acetaminophen (ROXICET) 5-325 MG per tablet Take 1-2 tablets by mouth every 4 (four) hours as needed. (Patient not taking: Reported on 12/20/2015) 30 tablet 0  . pantoprazole (PROTONIX) 40 MG tablet Take 1 tablet (40 mg total) by mouth daily. (Patient not taking: Reported on 12/20/2015) 30 tablet 0   No current facility-administered medications for this encounter.     REVIEW OF SYSTEMS:  A 15 point review of systems is documented in the electronic medical record. This was obtained by the nursing staff. However, I reviewed this with the patient to discuss relevant findings and make appropriate changes.  Pertinent items noted in HPI and remainder of comprehensive ROS otherwise negative.  Gynecologic History  Age at first menstrual period? 13  Are you still having periods? No Approximate date of last period? 1997  If you no longer have periods: Have you used hormone replacement? No Obstetric History:  How many children have you carried to term? 2 Your age at first live birth? 77  Have you used birth control pills or hormone shots for contraception? yes  If so, for how long (or approximate dates)? 3+ years Health Maintenance:  Have you ever had a colonoscopy? No  Have you ever had a bone density? Yes If yes, date? 2002 or 2003  Date of your last PAP smear? 6 years ago  Date of your FIRST mammogram? 1990s   PHYSICAL EXAM:  Vitals with BMI 12/20/2015  Height 5' 0"   Weight 160 lbs 2 oz  BMI 10.2  Systolic 725  Diastolic 76  Pulse 86  Respirations 18   General: Well-developed, in no acute distress HEENT: Normocephalic, atraumatic; oral cavity clear Neck: Supple without any lymphadenopathy Cardiovascular: Regular rate and rhythm Respiratory: Clear to auscultation bilaterally GI: Soft, nontender, normal bowel sounds Extremities: No edema present Neuro: No focal deficits Breasts:  Palpable mass is present in the lower outer quadrant of the right breast. No discrete palpable mass is present in the left breast and no axillary adenopathy present. No significant skin changes. This patient is status post bilateral biopsy.    ECOG = 1  0 - Asymptomatic (Fully active, able to carry on all predisease activities without restriction)  1 - Symptomatic but completely ambulatory (Restricted in physically strenuous activity but ambulatory and able to carry out work  of a light or sedentary nature. For example, light housework, office work)  2 - Symptomatic, <50% in bed during the day (Ambulatory and capable of all self care but unable to carry out any work activities. Up and about more than 50% of waking hours)  3 - Symptomatic, >50% in bed, but not bedbound (Capable of only limited self-care, confined to bed or chair 50% or more of waking hours)  4 - Bedbound (Completely disabled. Cannot carry on any self-care. Totally confined to bed or chair)  5 - Death   Eustace Pen MM, Creech RH, Tormey DC, et al. [phone]). "Toxicity and response criteria of the [facility]". [employer] Oncol. 5 (6): 649-55     LABORATORY DATA:  Lab Results  Component Value Date   WBC 7.6 12/20/2015   HGB 13.5 12/20/2015   HCT 40.8 12/20/2015   MCV 85.2 12/20/2015   PLT 292 12/20/2015   Lab Results  Component Value Date   NA 140 12/20/2015   K 4.0 12/20/2015    CL 103 09/06/2015   CO2 22 12/20/2015   Lab Results  Component Value Date   ALT 20 12/20/2015   AST 19 12/20/2015   ALKPHOS 88 12/20/2015   BILITOT 0.40 12/20/2015      RADIOGRAPHY: Mm Digital Diagnostic Bilat  12/12/2015  CLINICAL DATA:  Status post bilateral breast biopsies. EXAM: DIAGNOSTIC BILATERAL MAMMOGRAM POST ULTRASOUND BIOPSIES COMPARISON:  Previous exam(s). FINDINGS: Mammographic images were obtained following ultrasound guided biopsy of an irregular mass within the outer right breast (9:30 o'clock axis), an irregular mass within the upper central left breast (11:30 o'clock axis), an enlarged left axillary lymph node, and a dilated left breast retroareolar duct. At the conclusion of each biopsy, tissue site markers were placed at each biopsy site. Coil shaped clip is well positioned at the site of the irregular RIGHT breast mass, 9:30 o'clock axis region. Coil shaped clip is well positioned at the site of the irregular mass within the upper central LEFT breast, 11:30 o'clock axis region. Heart shaped clip is well positioned at the site of the dilated duct within the retroareolar LEFT breast. IMPRESSION: Postprocedure mammogram for clip placements. All biopsy clips are well-positioned at the respective biopsy sites. By ultrasound, the [facility] clip appeared well-positioned within the enlarged left axillary lymph node. Final Assessment: Post Procedure Mammograms for Marker Placement Electronically Signed   By: Franki Cabot M.D.   On: 12/12/2015 16:57   US Breast Ltd Uni Left Inc Axilla  11/29/2015  CLINICAL DATA:  Right breast palpable mass felt by the patient. EXAM: DIGITAL DIAGNOSTIC BILATERAL MAMMOGRAM WITH 3D TOMOSYNTHESIS WITH CAD ULTRASOUND BILATERAL BREAST COMPARISON:  Previous exam(s). ACR Breast Density Category b: There are scattered areas of fibroglandular density. FINDINGS: There is a spiculated mass in the right breast slightly upper outer quadrant, middle depth, which  corresponds to the area of palpable concern. A second sub cm circumscribed nodule is seen in the right breast upper outer quadrant, anterior depth. In the left breast there is a multifocal macro lobulated mass centered at 12 o'clock, middle depth. Interspersed suspicious calcifications are seen. The process measures mammographically 3.4 x 4.9 x 6.7 cm. A dilated duct extends to the nipple and contains microcalcifications at the level of the nipple. There is also a nodule in the left breast 1 o'clock, middle to posterior depth. Mammographic images were processed with CAD. On physical exam, there is a superficially located firm fixed mass in the right 9 o'clock  breast, which corresponds to the area of palpable concern. An area of thickening is felt in the left 12 o'clock breast, middle depth. Targeted right breast ultrasound is performed, showing 9 o'clock 10 cm from the nipple hypoechoic spiculated mass with prominent hyperechoic halo which is taller than wide and abuts the skin surface. It measures 1.7 by 1.4 by 1.8 cm. There is increased internal vascularity. There is a separate hypoechoic slightly irregular nodule in the right breast 11 o'clock 1 cm from the nipple which measures 0.5 x 0.6 x 0.7 cm. Evaluation of the right axilla demonstrates a grossly abnormal lymph node in the deep right axilla which measures 1.2 by 1.4 by 1.2 cm, which demonstrates complete effacement of its hilum. It is located in close proximity to the axillary vessels, and is likely not amenable to ultrasound-guided core needle biopsy. Several other mildly abnormal lymph nodes with cortical thickening of up to 0.4 cm are seen in the lower right axilla. Targeted left breast ultrasound demonstrates a multifocal fragmented mass centered in the left 12 o'clock breast. The main component of the mass is a hypoechoic irregular taller than wide mass which measures 1.3 by 1.1 by 2.1 cm located at 12 o'clock 7 cm from the nipple. Slightly superior to  it at 12 o'clock 9 cm from the nipple, there is a second discrete mass measuring 1.6 by 0.7 by 1.9 cm. Other suspicious satellite nodules are seen in the adjacent breast parenchyma. There is a separate microcalcifications containing suspicious nodule in the left breast 1 o'clock 10 cm from the nipple measuring 0.7 by 0.8 by 0.7 cm. There is a dilated duct extending from the 12 o'clock process to the nipple containing microcalcifications. Evaluation of the left axilla demonstrates a grossly abnormal lymph node with cortical thickness of up to 1 cm and abnormal cortical vascularity in the lower left axilla. A second indeterminate lymph node is seen in the axilla as well. IMPRESSION: Right breast 9 o'clock suspicious mass with right axillary lymphadenopathy. Right breast 11 o'clock 7 mm indeterminate nodule. Left breast 12 o'clock multifocal suspicious mass with ductal ectasia to the level of the nipple, likely involved by disease, and associated left axillary lymphadenopathy. Left breast 1 o'clock 8 mm suspicious nodule. RECOMMENDATION: Ultrasound-guided core needle biopsy of the right breast 9 o'clock index mass, left breast 12 o'clock index mass, and left axillary lymph node. The grossly abnormal right axillary lymph node may not be amenable to ultrasound-guided core needle biopsy. I have discussed the findings and recommendations with the patient. Results were also provided in writing at the conclusion of the visit. If applicable, a reminder letter will be sent to the patient regarding the next appointment. BI-RADS CATEGORY  5: Highly suggestive of malignancy. Electronically Signed   By: Fidela Salisbury M.D.   On: 11/29/2015 14:07   US Breast Ltd Uni Right Inc Axilla  11/29/2015  CLINICAL DATA:  Right breast palpable mass felt by the patient. EXAM: DIGITAL DIAGNOSTIC BILATERAL MAMMOGRAM WITH 3D TOMOSYNTHESIS WITH CAD ULTRASOUND BILATERAL BREAST COMPARISON:  Previous exam(s). ACR Breast Density Category b:  There are scattered areas of fibroglandular density. FINDINGS: There is a spiculated mass in the right breast slightly upper outer quadrant, middle depth, which corresponds to the area of palpable concern. A second sub cm circumscribed nodule is seen in the right breast upper outer quadrant, anterior depth. In the left breast there is a multifocal macro lobulated mass centered at 12 o'clock, middle depth. Interspersed suspicious calcifications are seen. The  process measures mammographically 3.4 x 4.9 x 6.7 cm. A dilated duct extends to the nipple and contains microcalcifications at the level of the nipple. There is also a nodule in the left breast 1 o'clock, middle to posterior depth. Mammographic images were processed with CAD. On physical exam, there is a superficially located firm fixed mass in the right 9 o'clock breast, which corresponds to the area of palpable concern. An area of thickening is felt in the left 12 o'clock breast, middle depth. Targeted right breast ultrasound is performed, showing 9 o'clock 10 cm from the nipple hypoechoic spiculated mass with prominent hyperechoic halo which is taller than wide and abuts the skin surface. It measures 1.7 by 1.4 by 1.8 cm. There is increased internal vascularity. There is a separate hypoechoic slightly irregular nodule in the right breast 11 o'clock 1 cm from the nipple which measures 0.5 x 0.6 x 0.7 cm. Evaluation of the right axilla demonstrates a grossly abnormal lymph node in the deep right axilla which measures 1.2 by 1.4 by 1.2 cm, which demonstrates complete effacement of its hilum. It is located in close proximity to the axillary vessels, and is likely not amenable to ultrasound-guided core needle biopsy. Several other mildly abnormal lymph nodes with cortical thickening of up to 0.4 cm are seen in the lower right axilla. Targeted left breast ultrasound demonstrates a multifocal fragmented mass centered in the left 12 o'clock breast. The main component  of the mass is a hypoechoic irregular taller than wide mass which measures 1.3 by 1.1 by 2.1 cm located at 12 o'clock 7 cm from the nipple. Slightly superior to it at 12 o'clock 9 cm from the nipple, there is a second discrete mass measuring 1.6 by 0.7 by 1.9 cm. Other suspicious satellite nodules are seen in the adjacent breast parenchyma. There is a separate microcalcifications containing suspicious nodule in the left breast 1 o'clock 10 cm from the nipple measuring 0.7 by 0.8 by 0.7 cm. There is a dilated duct extending from the 12 o'clock process to the nipple containing microcalcifications. Evaluation of the left axilla demonstrates a grossly abnormal lymph node with cortical thickness of up to 1 cm and abnormal cortical vascularity in the lower left axilla. A second indeterminate lymph node is seen in the axilla as well. IMPRESSION: Right breast 9 o'clock suspicious mass with right axillary lymphadenopathy. Right breast 11 o'clock 7 mm indeterminate nodule. Left breast 12 o'clock multifocal suspicious mass with ductal ectasia to the level of the nipple, likely involved by disease, and associated left axillary lymphadenopathy. Left breast 1 o'clock 8 mm suspicious nodule. RECOMMENDATION: Ultrasound-guided core needle biopsy of the right breast 9 o'clock index mass, left breast 12 o'clock index mass, and left axillary lymph node. The grossly abnormal right axillary lymph node may not be amenable to ultrasound-guided core needle biopsy. I have discussed the findings and recommendations with the patient. Results were also provided in writing at the conclusion of the visit. If applicable, a reminder letter will be sent to the patient regarding the next appointment. BI-RADS CATEGORY  5: Highly suggestive of malignancy. Electronically Signed   By: Fidela Salisbury M.D.   On: 11/29/2015 14:07   Mm Diag Breast Tomo Bilateral  11/29/2015  CLINICAL DATA:  Right breast palpable mass felt by the patient. EXAM:  DIGITAL DIAGNOSTIC BILATERAL MAMMOGRAM WITH 3D TOMOSYNTHESIS WITH CAD ULTRASOUND BILATERAL BREAST COMPARISON:  Previous exam(s). ACR Breast Density Category b: There are scattered areas of fibroglandular density. FINDINGS: There is a  spiculated mass in the right breast slightly upper outer quadrant, middle depth, which corresponds to the area of palpable concern. A second sub cm circumscribed nodule is seen in the right breast upper outer quadrant, anterior depth. In the left breast there is a multifocal macro lobulated mass centered at 12 o'clock, middle depth. Interspersed suspicious calcifications are seen. The process measures mammographically 3.4 x 4.9 x 6.7 cm. A dilated duct extends to the nipple and contains microcalcifications at the level of the nipple. There is also a nodule in the left breast 1 o'clock, middle to posterior depth. Mammographic images were processed with CAD. On physical exam, there is a superficially located firm fixed mass in the right 9 o'clock breast, which corresponds to the area of palpable concern. An area of thickening is felt in the left 12 o'clock breast, middle depth. Targeted right breast ultrasound is performed, showing 9 o'clock 10 cm from the nipple hypoechoic spiculated mass with prominent hyperechoic halo which is taller than wide and abuts the skin surface. It measures 1.7 by 1.4 by 1.8 cm. There is increased internal vascularity. There is a separate hypoechoic slightly irregular nodule in the right breast 11 o'clock 1 cm from the nipple which measures 0.5 x 0.6 x 0.7 cm. Evaluation of the right axilla demonstrates a grossly abnormal lymph node in the deep right axilla which measures 1.2 by 1.4 by 1.2 cm, which demonstrates complete effacement of its hilum. It is located in close proximity to the axillary vessels, and is likely not amenable to ultrasound-guided core needle biopsy. Several other mildly abnormal lymph nodes with cortical thickening of up to 0.4 cm are seen  in the lower right axilla. Targeted left breast ultrasound demonstrates a multifocal fragmented mass centered in the left 12 o'clock breast. The main component of the mass is a hypoechoic irregular taller than wide mass which measures 1.3 by 1.1 by 2.1 cm located at 12 o'clock 7 cm from the nipple. Slightly superior to it at 12 o'clock 9 cm from the nipple, there is a second discrete mass measuring 1.6 by 0.7 by 1.9 cm. Other suspicious satellite nodules are seen in the adjacent breast parenchyma. There is a separate microcalcifications containing suspicious nodule in the left breast 1 o'clock 10 cm from the nipple measuring 0.7 by 0.8 by 0.7 cm. There is a dilated duct extending from the 12 o'clock process to the nipple containing microcalcifications. Evaluation of the left axilla demonstrates a grossly abnormal lymph node with cortical thickness of up to 1 cm and abnormal cortical vascularity in the lower left axilla. A second indeterminate lymph node is seen in the axilla as well. IMPRESSION: Right breast 9 o'clock suspicious mass with right axillary lymphadenopathy. Right breast 11 o'clock 7 mm indeterminate nodule. Left breast 12 o'clock multifocal suspicious mass with ductal ectasia to the level of the nipple, likely involved by disease, and associated left axillary lymphadenopathy. Left breast 1 o'clock 8 mm suspicious nodule. RECOMMENDATION: Ultrasound-guided core needle biopsy of the right breast 9 o'clock index mass, left breast 12 o'clock index mass, and left axillary lymph node. The grossly abnormal right axillary lymph node may not be amenable to ultrasound-guided core needle biopsy. I have discussed the findings and recommendations with the patient. Results were also provided in writing at the conclusion of the visit. If applicable, a reminder letter will be sent to the patient regarding the next appointment. BI-RADS CATEGORY  5: Highly suggestive of malignancy. Electronically Signed   By: Fidela Salisbury  M.D.   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 Grade II INVASIVE DUCTAL CARCINOMA of the Left breast at the 11:30 o'clock location. CARCINOMA CONSISTENT WITH METASTATIC CARCINOMA of the Left axillary lymph node. The core biopsies show extensive ductal carcinoma with no residual lymph node tissue identified. This may represent a lymph node completely replaced by tumor. DUCTAL CARCINOMA of the retroareolar area of the Left breast. Some of the tumor appears in situ and may partially involve a papilloma. The specimen however is fragmented and an invasive component cannot be ruled out. This was found to be concordant by Dr. Franki Cabot. Pathology results were discussed with the patient via telephone. She reported tenderness at the biopsy sites and is doing well otherwise. Post biopsy care and instructions were reviewed and questions were answered. She was encouraged to contact [facility] with any additional questions and or concerns. The patient was referred to [facility] on December 20, 2015. Pathology results reported by Terie Purser RN on December 13, 2015. Electronically Signed   By: Franki Cabot M.D.   On: 12/13/2015 14:08  12/13/2015  CLINICAL DATA:  Patient with bilateral breast masses presents today for ultrasound-guided biopsies. EXAM: ULTRASOUND GUIDED RIGHT BREAST CORE NEEDLE BIOPSY ULTRASOUND-GUIDED LEFT BREAST CORE NEEDLE BIOPSIES X2 ULTRASOUND-GUIDED LEFT AXILLA LYMPH NODE BIOPSY COMPARISON:  Previous exam(s). PROCEDURE: I met with the patient and we discussed the procedure of ultrasound-guided biopsy, including benefits and alternatives. We discussed the high likelihood of a successful  procedure. We discussed the risks of the procedure including infection, bleeding, tissue injury, clip migration, and inadequate sampling. Informed written consent was given. The usual time-out protocol was performed immediately prior to the procedure. Using sterile technique and 1% lidocaine as local anesthetic, under direct ultrasound visualization, a 12 gauge spring-loaded device was used to perform biopsy of the irregular mass within the outer RIGHT breast, 9:30 o'clock axis,using a lateral approach. At the conclusion of the procedure, a coil shaped tissue marker clip was deployed into the biopsy cavity. Next, using sterile technique and 1% lidocaine as local anesthetic, under direct ultrasound visualization, a 12 gauge spring-loaded device was used to perform biopsy of the irregular mass within the upper central LEFT breast, 11:30 o'clock axis, using a lateral approach. At the conclusion of the procedure, a coil shaped tissue marker clip was deployed into the biopsy site. Next, using sterile technique and 1% lidocaine as local anesthetic, under direct ultrasound visualization, a 14 gauge spring-loaded device was used to perform biopsy of the enlarged lymph node in the LEFT axilla using a lateral approach. At the conclusion of the procedure, a HydroMARK clip was placed into the lymph node. Next, using sterile technique and 1% lidocaine as local anesthetic, under direct ultrasound visualization, a 12 gauge spring-loaded device was used to perform biopsy of the LEFT breast dilated retroareolar duct using a lateral approach. At the conclusion of the procedure, a heart shaped clip was deployed into the biopsy site. IMPRESSION: 1. Ultrasound-guided biopsy of the irregular mass within the outer RIGHT breast, 9:30 o'clock axis, with coil shaped tissue marker clip placed at the biopsy site. 2. Ultrasound-guided biopsy of the irregular mass within the upper central LEFT breast, 11:30 o'clock axis,  with coil shaped tissue  marker clip placed at the biopsy site. 3. Ultrasound-guided biopsy of the enlarged lymph node in the LEFT axilla with a HydroMARK clip placed into the lymph node. 4. Ultrasound-guided biopsy of the dilated LEFT retroareolar duct with heart shaped clip placed at the biopsy site. No apparent complications. Electronically Signed: By: Franki Cabot M.D. 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 Grade II INVASIVE DUCTAL CARCINOMA of the Left breast at the 11:30 o'clock location. CARCINOMA CONSISTENT WITH METASTATIC CARCINOMA of the Left axillary lymph node. The core biopsies show extensive ductal carcinoma with no residual lymph node tissue identified. This may represent a lymph node completely replaced by tumor. DUCTAL CARCINOMA of the retroareolar area of the Left breast. Some of the tumor appears in situ and may partially involve a papilloma. The specimen however is fragmented and an invasive component cannot be ruled out. This was found to be concordant by Dr. Franki Cabot. Pathology results were discussed with the patient via telephone. She reported tenderness at the biopsy sites and is doing well otherwise. Post biopsy care and instructions were reviewed and questions were answered. She was encouraged to contact [facility] with any additional questions and or concerns. The patient was referred to [facility] on December 20, 2015. Pathology results reported by Terie Purser RN on December 13, 2015. Electronically Signed   By: Franki Cabot M.D.   On: 12/13/2015 14:08  12/13/2015  CLINICAL DATA:  Patient with bilateral breast masses presents today for ultrasound-guided biopsies. EXAM: ULTRASOUND GUIDED RIGHT  BREAST CORE NEEDLE BIOPSY ULTRASOUND-GUIDED LEFT BREAST CORE NEEDLE BIOPSIES X2 ULTRASOUND-GUIDED LEFT AXILLA LYMPH NODE BIOPSY COMPARISON:  Previous exam(s). PROCEDURE: I met with the patient and we discussed the procedure of ultrasound-guided biopsy, including benefits and alternatives. We discussed the high likelihood of a successful procedure. We discussed the risks of the procedure including infection, bleeding, tissue injury, clip migration, and inadequate sampling. Informed written consent was given. The usual time-out protocol was performed immediately prior to the procedure. Using sterile technique and 1% lidocaine as local anesthetic, under direct ultrasound visualization, a 12 gauge spring-loaded device was used to perform biopsy of the irregular mass within the outer RIGHT breast, 9:30 o'clock axis,using a lateral approach. At the conclusion of the procedure, a coil shaped tissue marker clip was deployed into the biopsy cavity. Next, using sterile technique and 1% lidocaine as local anesthetic, under direct ultrasound visualization, a 12 gauge spring-loaded device was used to perform biopsy of the irregular mass within the upper central LEFT breast, 11:30 o'clock axis, using a lateral approach. At the conclusion of the procedure, a coil shaped tissue marker clip was deployed into the biopsy site. Next, using sterile technique and 1% lidocaine as local anesthetic, under direct ultrasound visualization, a 14 gauge spring-loaded device was used to perform biopsy of the enlarged lymph node in the LEFT axilla using a lateral approach. At the conclusion of the procedure, a HydroMARK clip was placed into the lymph node. Next, using sterile technique and 1% lidocaine as local anesthetic, under direct ultrasound visualization, a 12 gauge spring-loaded device was used to perform biopsy of the LEFT breast dilated retroareolar duct using a lateral approach.  At the conclusion of the procedure, a heart shaped clip was  deployed into the biopsy site. IMPRESSION: 1. Ultrasound-guided biopsy of the irregular mass within the outer RIGHT breast, 9:30 o'clock axis, with coil shaped tissue marker clip placed at the biopsy site. 2. Ultrasound-guided biopsy of the irregular mass within the upper central LEFT breast, 11:30 o'clock axis, with coil shaped tissue marker clip placed at the biopsy site. 3. Ultrasound-guided biopsy of the enlarged lymph node in the LEFT axilla with a HydroMARK clip placed into the lymph node. 4. Ultrasound-guided biopsy of the dilated LEFT retroareolar duct with heart shaped clip placed at the biopsy site. No apparent complications. Electronically Signed: By: Franki Cabot M.D. 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 Grade II INVASIVE DUCTAL CARCINOMA of the Left breast at the 11:30 o'clock location. CARCINOMA CONSISTENT WITH METASTATIC CARCINOMA of the Left axillary lymph node. The core biopsies show extensive ductal carcinoma with no residual lymph node tissue identified. This may represent a lymph node completely replaced by tumor. DUCTAL CARCINOMA of the retroareolar area of the Left breast. Some of the tumor appears in situ and may partially involve a papilloma. The specimen however is fragmented and an invasive component cannot be ruled out. This was found to be concordant by Dr. Franki Cabot. Pathology results were discussed with the patient via telephone. She reported tenderness at the biopsy sites and is doing well otherwise. Post biopsy care and instructions were reviewed and questions were answered. She was encouraged to contact [facility] with any additional questions and or concerns. The patient was referred to [facility] at  [facility] on December 20, 2015. Pathology results reported by Terie Purser RN on December 13, 2015. Electronically Signed   By: Franki Cabot M.D.   On: 12/13/2015 14:08  12/13/2015  CLINICAL DATA:  Patient with bilateral breast masses presents today for ultrasound-guided biopsies. EXAM: ULTRASOUND GUIDED RIGHT BREAST CORE NEEDLE BIOPSY ULTRASOUND-GUIDED LEFT BREAST CORE NEEDLE BIOPSIES X2 ULTRASOUND-GUIDED LEFT AXILLA LYMPH NODE BIOPSY COMPARISON:  Previous exam(s). PROCEDURE: I met with the patient and we discussed the procedure of ultrasound-guided biopsy, including benefits and alternatives. We discussed the high likelihood of a successful procedure. We discussed the risks of the procedure including infection, bleeding, tissue injury, clip migration, and inadequate sampling. Informed written consent was given. The usual time-out protocol was performed immediately prior to the procedure. Using sterile technique and 1% lidocaine as local anesthetic, under direct ultrasound visualization, a 12 gauge spring-loaded device was used to perform biopsy of the irregular mass within the outer RIGHT breast, 9:30 o'clock axis,using a lateral approach. At the conclusion of the procedure, a coil shaped tissue marker clip was deployed into the biopsy cavity. Next, using sterile technique and 1% lidocaine as local anesthetic, under direct ultrasound visualization, a 12 gauge spring-loaded device was used to perform biopsy of the irregular mass within the upper central LEFT breast, 11:30 o'clock axis, using a lateral approach. At the conclusion of the procedure, a coil shaped tissue marker clip was deployed into the biopsy site. Next, using sterile technique and 1% lidocaine as local anesthetic, under direct ultrasound visualization, a 14 gauge spring-loaded device was used to perform biopsy of the enlarged lymph  node in the LEFT axilla using a lateral approach. At the conclusion of the procedure, a  HydroMARK clip was placed into the lymph node. Next, using sterile technique and 1% lidocaine as local anesthetic, under direct ultrasound visualization, a 12 gauge spring-loaded device was used to perform biopsy of the LEFT breast dilated retroareolar duct using a lateral approach. At the conclusion of the procedure, a heart shaped clip was deployed into the biopsy site. IMPRESSION: 1. Ultrasound-guided biopsy of the irregular mass within the outer RIGHT breast, 9:30 o'clock axis, with coil shaped tissue marker clip placed at the biopsy site. 2. Ultrasound-guided biopsy of the irregular mass within the upper central LEFT breast, 11:30 o'clock axis, with coil shaped tissue marker clip placed at the biopsy site. 3. Ultrasound-guided biopsy of the enlarged lymph node in the LEFT axilla with a HydroMARK clip placed into the lymph node. 4. Ultrasound-guided biopsy of the dilated LEFT retroareolar duct with heart shaped clip placed at the biopsy site. No apparent complications. Electronically Signed: By: Franki Cabot M.D. 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 Grade II INVASIVE DUCTAL CARCINOMA of the Left breast at the 11:30 o'clock location. CARCINOMA CONSISTENT WITH METASTATIC CARCINOMA of the Left axillary lymph node. The core biopsies show extensive ductal carcinoma with no residual lymph node tissue identified. This may represent a lymph node completely replaced by tumor. DUCTAL CARCINOMA of the retroareolar area of the Left breast. Some of the tumor appears in situ and may partially involve a papilloma. The specimen however is fragmented and an invasive component cannot be ruled out. This was found to be concordant by Dr. Franki Cabot. Pathology results were discussed with the patient via telephone. She  reported tenderness at the biopsy sites and is doing well otherwise. Post biopsy care and instructions were reviewed and questions were answered. She was encouraged to contact [facility] with any additional questions and or concerns. The patient was referred to [facility] on December 20, 2015. Pathology results reported by Terie Purser RN on December 13, 2015. Electronically Signed   By: Franki Cabot M.D.   On: 12/13/2015 14:08  12/13/2015  CLINICAL DATA:  Patient with bilateral breast masses presents today for ultrasound-guided biopsies. EXAM: ULTRASOUND GUIDED RIGHT BREAST CORE NEEDLE BIOPSY ULTRASOUND-GUIDED LEFT BREAST CORE NEEDLE BIOPSIES X2 ULTRASOUND-GUIDED LEFT AXILLA LYMPH NODE BIOPSY COMPARISON:  Previous exam(s). PROCEDURE: I met with the patient and we discussed the procedure of ultrasound-guided biopsy, including benefits and alternatives. We discussed the high likelihood of a successful procedure. We discussed the risks of the procedure including infection, bleeding, tissue injury, clip migration, and inadequate sampling. Informed written consent was given. The usual time-out protocol was performed immediately prior to the procedure. Using sterile technique and 1% lidocaine as local anesthetic, under direct ultrasound visualization, a 12 gauge spring-loaded device was used to perform biopsy of the irregular mass within the outer RIGHT breast, 9:30 o'clock axis,using a lateral approach. At the conclusion of the procedure, a coil shaped tissue marker clip was deployed into the biopsy cavity. Next, using sterile technique and 1% lidocaine as local anesthetic, under direct ultrasound visualization, a 12 gauge spring-loaded device was used to perform biopsy of the irregular mass within  the upper central LEFT breast, 11:30 o'clock axis, using a lateral approach. At the conclusion of the procedure, a  coil shaped tissue marker clip was deployed into the biopsy site. Next, using sterile technique and 1% lidocaine as local anesthetic, under direct ultrasound visualization, a 14 gauge spring-loaded device was used to perform biopsy of the enlarged lymph node in the LEFT axilla using a lateral approach. At the conclusion of the procedure, a HydroMARK clip was placed into the lymph node. Next, using sterile technique and 1% lidocaine as local anesthetic, under direct ultrasound visualization, a 12 gauge spring-loaded device was used to perform biopsy of the LEFT breast dilated retroareolar duct using a lateral approach. At the conclusion of the procedure, a heart shaped clip was deployed into the biopsy site. IMPRESSION: 1. Ultrasound-guided biopsy of the irregular mass within the outer RIGHT breast, 9:30 o'clock axis, with coil shaped tissue marker clip placed at the biopsy site. 2. Ultrasound-guided biopsy of the irregular mass within the upper central LEFT breast, 11:30 o'clock axis, with coil shaped tissue marker clip placed at the biopsy site. 3. Ultrasound-guided biopsy of the enlarged lymph node in the LEFT axilla with a HydroMARK clip placed into the lymph node. 4. Ultrasound-guided biopsy of the dilated LEFT retroareolar duct with heart shaped clip placed at the biopsy site. No apparent complications. Electronically Signed: By: Franki Cabot M.D. On: 12/12/2015 16:53       IMPRESSION: The patient has a complex presentation as noted above. She will proceed with bilateral surgery, potentially bilateral mastectomy. For the left-sided tumor, I would recommend postoperative radiation treatment given the known node-positive tumor. On the right, an enlarged lymph node could not be biopsied due to its close proximity to the blood vessels. Depending on the final pathology, postmastectomy radiation treatment may also be appropriate on the right as well. The patient it is anticipated therefore will require adjuvant  radiation treatment with the details to be decided with additional information from surgery.  I discussed the role of radiation treatment in the setting of breast cancer. We discussed the rationale of treatment, potential benefits in local/regional control, and we also discussed the potential side effects and risks of treatment. All of the patient's questions were answered.   PLAN: The patient will proceed with further evaluation through medical oncology with a CT scan of the chest and bone scan discussed in conference today. She also will proceed with bilateral surgery and further workup to help determine the role of adjuvant chemotherapy.   I look forward to seeing the patient back postoperatively at the appropriate time to further discuss adjuvant radiation treatment. As noted above, the patient will require radiation treatment on the left and potentially on the right with further information from surgery. I anticipate a 6-1/2 week course of radiation treatment.     ________________________________   Jodelle Gross, MD, PhD  This document serves as a record of services personally performed by Kyung Rudd, MD. It was created on his behalf by Darcus Austin, a trained medical scribe. The creation of this record is based on the scribe's personal observations and the provider's statements to them. This document has been checked and approved by the attending provider.

## 2015-12-20 NOTE — Progress Notes (Signed)
[name]  [phone] [phone]     ID: Kristen Howe DOB: 03/24/1946  MR#: [id number]  VCB#:[id number]  Patient Care Team: Kristen Circle, FNP as PCP - General (Family Medicine) Kristen Cruel, MD as Consulting Physician (Oncology) Kristen Rudd, MD as Consulting Physician (Radiation Oncology) Kristen Klein, MD as Consulting Physician (General Surgery) PCP: Kristen Po, FNP GYN: OTHER MD:  CHIEF COMPLAINT: bilateral breast cancer  CURRENT TREATMENT: awaiting definitive surgery   BREAST CANCER HISTORY: "Kristen Howe" herself palpated a mass in her right breast sometime in November 2016. She brought it to Dr. Purcell Nails attention on 11/20/2015 and he set her up for bilateral diagnostic mammography with tomography and I lateral [facility]. The breast density was category B. In the right breast upper outer quadrant there where 2 nodules, at the 9:00 position (which by ultrasound was spiculated and measured 1.7 cm) and at the 11:00 position (which by ultrasound measured 0.7 cm. The right axilla showed a grossly abnormal lymph node measuring 1.4 cm with complete effacement of the hilum.there were several other small lymph nodes with cortical thickening in the lower right axilla as well.  Biopsy of the right breast mass on 12/12/2015 showed ([id number]) and invasive ductal carcinoma, grade 2, estrogen receptor 100% positive, progesterone receptor 80% positive, both with strong staining intensity, with an MIB-1 of 20%, and HER-2 nonamplified, with a signals ratio of 1.33 and number per cell 3.25.  In the left breast mammographically there was a multifocal mass centered at the 12:00 location with suspicious calcifications. The processes measured 6.7 cm altogether. A dilated duct extended to the nipple from this area, containing microcalcifications. Ultrasonography of the left breast found a multifocal fragmented mass  superiorly, the main component being hypoechoic and irregular and tolerable than wide, measuring 2.1 cm.superior to that, there was a second mass measuring 1.9 cm and there were other suspicious solid nodules in the adjacent parenchyma. There was also a separate area of microcalcifications containing a suspicious nodule at the 1:00 position. This measured 0.8 cm. The ultrasound confirmed a dilated duct extending from the superior process to the nipple. An alteration of the left axilla showed a grossly abnormal lymph node measuring up to 1 cm and a second indeterminant lymph node.  Biopsy of the left breast 11:30 o'clock massshowed invasive ductal carcinoma, grade 2, estrogen receptor 100% positive, progesterone receptor 70% positive, with an MIB-1 of 20%, and no HER-2 amplification, the signals ratio being 1.59 and the number per cell 3.90. A second area in the breast described as retroareolar showed ductal carcinoma, possibly in situ. This was also estrogen receptor positive at 100%, progesterone receptor positive at 60%, with an MIB-1 of 5%, and HER-2 negative, with a signals ratio of 1.73 and number per cell 3.55. Biopsy of the left breast lymph node was positiveand the prognostic panel there was very similar to all the others, namely estrogen receptor 100% positive, progesterone receptor 40% positive, with an MIB-1 of 40%, and HER-2 no amplification, with a signals ratio of 1.52 and number per cell 3.85.  The patient's subsequent history is as detailed below.  INTERVAL HISTORY: Kristen Howe was evaluated in the multidisciplinary breast cancer clinic 12/20/2015 accompanied by her sister Kristen Howe. Kristen Howe's case was also presented in the multidisciplinary breast cancer conference that same morning. At that time a preliminary plan was proposed: obtaining staging studies, a breast MRI, further right breast biopsy if the patient wishes breast conservation,  chemotherapy, radiation, and anti-estrogens  REVIEW OF  SYSTEMS: Aside from the mass itself, there were no specific symptoms leading to the original mammogram, which was routinely scheduled. The patient denies unusual headaches, visual changes, nausea, vomiting, stiff neck, dizziness, or gait imbalance. There has been no cough, phlegm production, or pleurisy, no chest pain or pressure, and no change in bowel or bladder habits. 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 A detailed review of systems was otherwise entirely negative.  PAST MEDICAL HISTORY: Past Medical History  Diagnosis Date  . Hypertension   . Hyperlipidemia   . Pneumonia   . GERD (gastroesophageal reflux disease)   . Headache     migraines occasionally  . Arthritis   . Leg cramps     takes Flexeril  . Anemia     as a child  . Primary cancer of 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 breast ([facility]) 12/14/2015  . Breast cancer ([place])     PAST SURGICAL HISTORY: Past Surgical History  Procedure Laterality Date  . Cesarean section    . Eye surgery Bilateral     cataract surgery with lens implant,   . Eye surgery      for cross eyes as a child  . Tonsillectomy    . Umbilical hernia repair N/A 09/12/2015    Procedure: REPAIR OF INCISIONAL AND UMBILICAL HERNIA;  Surgeon: Kristen Luna, MD;  Location: [facility];  Service: General;  Laterality: N/A;  . Insertion of mesh N/A 09/12/2015    Procedure: INSERTION OF MESH;  Surgeon: Kristen Luna, MD;  Location: [facility] OR;  Service: General;  Laterality: N/A;    FAMILY HISTORY Family History  Problem Relation Age of Onset  . Healthy Mother   . Lymphoma Father   . Breast cancer Sister   the patient's father died at the age of 23 from complications of diabetes. He had a history of lymphoma. The patient's mother is currently living at age 27. The patient's brother died in an automobile accident. The patient has a twin  sister, Kristen Howe. She has a history of bilateral breast cancers and did undergo genetic testing in November 2012, with no BRCA1 or 2 mutation found  GYNECOLOGIC HISTORY:  No LMP recorded. Patient is postmenopausal. Menarche age 77, first live birth age 69. The patient is GX P2. She stopped having periods approximately 1997. She did not take hormone replacement. She took oral contraceptives remotely for approximately 3 years, with no complications.  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 She is divorced and lives at home with her sister Joycelyn Schmid, with their mother, and with 2 nephews, Rush Landmark, who is not employed, and Corene Cornea, who is disabled. The patient has 3 grandchildren. She is not a church attender    ADVANCED DIRECTIVES: Not in place. At the initial clinic visit 12/22/2015 the patient was given the appropriate forms to complete and notarize at her discretion.  HEALTH MAINTENANCE: Social History  Substance Use Topics  . Smoking status: Current Every Day Smoker -- 0.50 packs/day for 30 years    Types: Cigarettes    Last Attempt to Quit: 08/31/2015  . Smokeless tobacco: Never Used  . Alcohol Use: 0.0 oz/week    0 Standard drinks or equivalent per week     Comment: occasionally  Colonoscopy: never  PAP: 2010  Bone density: remote  Lipid panel:  No Known Allergies  Current Outpatient Prescriptions  Medication Sig Dispense Refill  . Calcium-Phosphorus-Vitamin D (CITRACAL +D3 Howe) Take 2 tablets by mouth 2 (two) times daily.     Marland Kitchen gemfibrozil (LOPID) 600 MG tablet Take 1 tablet (600 mg total) by mouth 2 (two) times daily before a meal. 180 tablet 0  . losartan-hydrochlorothiazide (HYZAAR) 50-12.5 MG per tablet Take 1 tablet by mouth daily. 90 tablet 1  . Multiple Vitamins-Minerals (MULTIVITAMIN WITH MINERALS) tablet Take 1 tablet by mouth daily. Reported on 12/20/2015    . cyclobenzaprine (FLEXERIL) 10 MG tablet Take 1 tablet  (10 mg total) by mouth 3 (three) times daily as needed for muscle spasms. (Patient not taking: Reported on 12/20/2015) 30 tablet 0  . oxyCODONE-acetaminophen (ROXICET) 5-325 MG per tablet Take 1-2 tablets by mouth every 4 (four) hours as needed. (Patient not taking: Reported on 12/20/2015) 30 tablet 0  . pantoprazole (PROTONIX) 40 MG tablet Take 1 tablet (40 mg total) by mouth daily. (Patient not taking: Reported on 12/20/2015) 30 tablet 0   No current facility-administered medications for this visit.    OBJECTIVE: middle-aged white woman who appears stated age 7 Vitals:   12/20/15 1258  BP: 144/76  Pulse: 86  Temp: 98.5 F (36.9 C)  Resp: 18     Body mass index is 31.27 kg/(m^2).    ECOG FS:1 - Symptomatic but completely ambulatory  Ocular: Sclerae unicteric, pupils unequal, divergent gaze Ear-nose-throat: Oropharynx clear and moist Lymphatic: No cervical or supraclavicular adenopathy Lungs no rales or rhonchi, good excursion bilaterally Heart regular rate and rhythm, no murmur appreciated Abd soft, obese,nontender, positive bowel sounds MSK no focal spinal tenderness, no joint edema Neuro: non-focal, well-oriented, anxious affect Breasts: the mass in the lower outer quadrant of the right breast is easily palpable, measures approximately 2 cm, is movable, with no associated erythema. I do not palpate any right axillary adenopathy. I do not palpate a clear mass in the left breast but there is significant ecchymosis from the prior biopsy. I do not palpate a mass in the left axilla.  LAB RESULTS:  CMP     Component Value Date/Time   NA 140 12/20/2015 1246   NA 138 09/06/2015 1219   K 4.0 12/20/2015 1246   K 3.9 09/06/2015 1219   CL 103 09/06/2015 1219   CO2 22 12/20/2015 1246   CO2 29 09/06/2015 1219   GLUCOSE 115 12/20/2015 1246   GLUCOSE 127* 09/06/2015 1219   BUN 11.7 12/20/2015 1246   BUN 16 09/06/2015 1219   CREATININE 0.8 12/20/2015 1246   CREATININE 0.79 09/06/2015 1219    CALCIUM 9.6 12/20/2015 1246   CALCIUM 9.9 09/06/2015 1219   PROT 7.3 12/20/2015 1246   PROT 7.1 09/06/2015 1219   ALBUMIN 3.9 12/20/2015 1246   ALBUMIN 4.0 09/06/2015 1219   AST 19 12/20/2015 1246   AST 20 09/06/2015 1219   ALT 20 12/20/2015 1246   ALT 21 09/06/2015 1219   ALKPHOS 88 12/20/2015 1246   ALKPHOS 67 09/06/2015 1219   BILITOT 0.40 12/20/2015 1246   BILITOT 0.4 09/06/2015 1219   GFRNONAA >60 09/06/2015 1219   GFRAA >60 09/06/2015 1219    INo results found for: SPEP, UPEP  Lab Results  Component Value Date   WBC 7.6 12/20/2015   NEUTROABS 5.4 12/20/2015   HGB 13.5 12/20/2015   HCT 40.8 12/20/2015   MCV 85.2 12/20/2015  PLT 292 12/20/2015      Chemistry      Component Value Date/Time   NA 140 12/20/2015 1246   NA 138 09/06/2015 1219   K 4.0 12/20/2015 1246   K 3.9 09/06/2015 1219   CL 103 09/06/2015 1219   CO2 22 12/20/2015 1246   CO2 29 09/06/2015 1219   BUN 11.7 12/20/2015 1246   BUN 16 09/06/2015 1219   CREATININE 0.8 12/20/2015 1246   CREATININE 0.79 09/06/2015 1219      Component Value Date/Time   CALCIUM 9.6 12/20/2015 1246   CALCIUM 9.9 09/06/2015 1219   ALKPHOS 88 12/20/2015 1246   ALKPHOS 67 09/06/2015 1219   AST 19 12/20/2015 1246   AST 20 09/06/2015 1219   ALT 20 12/20/2015 1246   ALT 21 09/06/2015 1219   BILITOT 0.40 12/20/2015 1246   BILITOT 0.4 09/06/2015 1219       No results found for: LABCA2  No components found for: LABCA125  No results for input(s): INR in the last 168 hours.  Urinalysis No results found for: COLORURINE, APPEARANCEUR, LABSPEC, [place], GLUCOSEU, HGBUR, BILIRUBINUR, KETONESUR, PROTEINUR, UROBILINOGEN, NITRITE, LEUKOCYTESUR  STUDIES: Mm Digital Diagnostic Bilat  12/12/2015  CLINICAL DATA:  Status post bilateral breast biopsies. EXAM: DIAGNOSTIC BILATERAL MAMMOGRAM POST ULTRASOUND BIOPSIES COMPARISON:  Previous exam(s). FINDINGS: Mammographic images were obtained following ultrasound guided biopsy of  an irregular mass within the outer right breast (9:30 o'clock axis), an irregular mass within the upper central left breast (11:30 o'clock axis), an enlarged left axillary lymph node, and a dilated left breast retroareolar duct. At the conclusion of each biopsy, tissue site markers were placed at each biopsy site. Coil shaped clip is well positioned at the site of the irregular RIGHT breast mass, 9:30 o'clock axis region. Coil shaped clip is well positioned at the site of the irregular mass within the upper central LEFT breast, 11:30 o'clock axis region. Heart shaped clip is well positioned at the site of the dilated duct within the retroareolar LEFT breast. IMPRESSION: Postprocedure mammogram for clip placements. All biopsy clips are well-positioned at the respective biopsy sites. By ultrasound, the [facility] clip appeared well-positioned within the enlarged left axillary lymph node. Final Assessment: Post Procedure Mammograms for Marker Placement Electronically Signed   By: Franki Cabot M.D.   On: 12/12/2015 16:57   US Breast Ltd Uni Left Inc Axilla  11/29/2015  CLINICAL DATA:  Right breast palpable mass felt by the patient. EXAM: DIGITAL DIAGNOSTIC BILATERAL MAMMOGRAM WITH 3D TOMOSYNTHESIS WITH CAD ULTRASOUND BILATERAL BREAST COMPARISON:  Previous exam(s). ACR Breast Density Category b: There are scattered areas of fibroglandular density. FINDINGS: There is a spiculated mass in the right breast slightly upper outer quadrant, middle depth, which corresponds to the area of palpable concern. A second sub cm circumscribed nodule is seen in the right breast upper outer quadrant, anterior depth. In the left breast there is a multifocal macro lobulated mass centered at 12 o'clock, middle depth. Interspersed suspicious calcifications are seen. The process measures mammographically 3.4 x 4.9 x 6.7 cm. A dilated duct extends to the nipple and contains microcalcifications at the level of the nipple. There is also a  nodule in the left breast 1 o'clock, middle to posterior depth. Mammographic images were processed with CAD. On physical exam, there is a superficially located firm fixed mass in the right 9 o'clock breast, which corresponds to the area of palpable concern. An area of thickening is felt in the left 12 o'clock breast, middle  depth. Targeted right breast ultrasound is performed, showing 9 o'clock 10 cm from the nipple hypoechoic spiculated mass with prominent hyperechoic halo which is taller than wide and abuts the skin surface. It measures 1.7 by 1.4 by 1.8 cm. There is increased internal vascularity. There is a separate hypoechoic slightly irregular nodule in the right breast 11 o'clock 1 cm from the nipple which measures 0.5 x 0.6 x 0.7 cm. Evaluation of the right axilla demonstrates a grossly abnormal lymph node in the deep right axilla which measures 1.2 by 1.4 by 1.2 cm, which demonstrates complete effacement of its hilum. It is located in close proximity to the axillary vessels, and is likely not amenable to ultrasound-guided core needle biopsy. Several other mildly abnormal lymph nodes with cortical thickening of up to 0.4 cm are seen in the lower right axilla. Targeted left breast ultrasound demonstrates a multifocal fragmented mass centered in the left 12 o'clock breast. The main component of the mass is a hypoechoic irregular taller than wide mass which measures 1.3 by 1.1 by 2.1 cm located at 12 o'clock 7 cm from the nipple. Slightly superior to it at 12 o'clock 9 cm from the nipple, there is a second discrete mass measuring 1.6 by 0.7 by 1.9 cm. Other suspicious satellite nodules are seen in the adjacent breast parenchyma. There is a separate microcalcifications containing suspicious nodule in the left breast 1 o'clock 10 cm from the nipple measuring 0.7 by 0.8 by 0.7 cm. There is a dilated duct extending from the 12 o'clock process to the nipple containing microcalcifications. Evaluation of the left  axilla demonstrates a grossly abnormal lymph node with cortical thickness of up to 1 cm and abnormal cortical vascularity in the lower left axilla. A second indeterminate lymph node is seen in the axilla as well. IMPRESSION: Right breast 9 o'clock suspicious mass with right axillary lymphadenopathy. Right breast 11 o'clock 7 mm indeterminate nodule. Left breast 12 o'clock multifocal suspicious mass with ductal ectasia to the level of the nipple, likely involved by disease, and associated left axillary lymphadenopathy. Left breast 1 o'clock 8 mm suspicious nodule. RECOMMENDATION: Ultrasound-guided core needle biopsy of the right breast 9 o'clock index mass, left breast 12 o'clock index mass, and left axillary lymph node. The grossly abnormal right axillary lymph node may not be amenable to ultrasound-guided core needle biopsy. I have discussed the findings and recommendations with the patient. Results were also provided in writing at the conclusion of the visit. If applicable, a reminder letter will be sent to the patient regarding the next appointment. BI-RADS CATEGORY  5: Highly suggestive of malignancy. Electronically Signed   By: Fidela Salisbury M.D.   On: 11/29/2015 14:07   US Breast Ltd Uni Right Inc Axilla  11/29/2015  CLINICAL DATA:  Right breast palpable mass felt by the patient. EXAM: DIGITAL DIAGNOSTIC BILATERAL MAMMOGRAM WITH 3D TOMOSYNTHESIS WITH CAD ULTRASOUND BILATERAL BREAST COMPARISON:  Previous exam(s). ACR Breast Density Category b: There are scattered areas of fibroglandular density. FINDINGS: There is a spiculated mass in the right breast slightly upper outer quadrant, middle depth, which corresponds to the area of palpable concern. A second sub cm circumscribed nodule is seen in the right breast upper outer quadrant, anterior depth. In the left breast there is a multifocal macro lobulated mass centered at 12 o'clock, middle depth. Interspersed suspicious calcifications are seen. The  process measures mammographically 3.4 x 4.9 x 6.7 cm. A dilated duct extends to the nipple and contains microcalcifications at the level  of the nipple. There is also a nodule in the left breast 1 o'clock, middle to posterior depth. Mammographic images were processed with CAD. On physical exam, there is a superficially located firm fixed mass in the right 9 o'clock breast, which corresponds to the area of palpable concern. An area of thickening is felt in the left 12 o'clock breast, middle depth. Targeted right breast ultrasound is performed, showing 9 o'clock 10 cm from the nipple hypoechoic spiculated mass with prominent hyperechoic halo which is taller than wide and abuts the skin surface. It measures 1.7 by 1.4 by 1.8 cm. There is increased internal vascularity. There is a separate hypoechoic slightly irregular nodule in the right breast 11 o'clock 1 cm from the nipple which measures 0.5 x 0.6 x 0.7 cm. Evaluation of the right axilla demonstrates a grossly abnormal lymph node in the deep right axilla which measures 1.2 by 1.4 by 1.2 cm, which demonstrates complete effacement of its hilum. It is located in close proximity to the axillary vessels, and is likely not amenable to ultrasound-guided core needle biopsy. Several other mildly abnormal lymph nodes with cortical thickening of up to 0.4 cm are seen in the lower right axilla. Targeted left breast ultrasound demonstrates a multifocal fragmented mass centered in the left 12 o'clock breast. The main component of the mass is a hypoechoic irregular taller than wide mass which measures 1.3 by 1.1 by 2.1 cm located at 12 o'clock 7 cm from the nipple. Slightly superior to it at 12 o'clock 9 cm from the nipple, there is a second discrete mass measuring 1.6 by 0.7 by 1.9 cm. Other suspicious satellite nodules are seen in the adjacent breast parenchyma. There is a separate microcalcifications containing suspicious nodule in the left breast 1 o'clock 10 cm from the nipple  measuring 0.7 by 0.8 by 0.7 cm. There is a dilated duct extending from the 12 o'clock process to the nipple containing microcalcifications. Evaluation of the left axilla demonstrates a grossly abnormal lymph node with cortical thickness of up to 1 cm and abnormal cortical vascularity in the lower left axilla. A second indeterminate lymph node is seen in the axilla as well. IMPRESSION: Right breast 9 o'clock suspicious mass with right axillary lymphadenopathy. Right breast 11 o'clock 7 mm indeterminate nodule. Left breast 12 o'clock multifocal suspicious mass with ductal ectasia to the level of the nipple, likely involved by disease, and associated left axillary lymphadenopathy. Left breast 1 o'clock 8 mm suspicious nodule. RECOMMENDATION: Ultrasound-guided core needle biopsy of the right breast 9 o'clock index mass, left breast 12 o'clock index mass, and left axillary lymph node. The grossly abnormal right axillary lymph node may not be amenable to ultrasound-guided core needle biopsy. I have discussed the findings and recommendations with the patient. Results were also provided in writing at the conclusion of the visit. If applicable, a reminder letter will be sent to the patient regarding the next appointment. BI-RADS CATEGORY  5: Highly suggestive of malignancy. Electronically Signed   By: Fidela Salisbury M.D.   On: 11/29/2015 14:07   Mm Diag Breast Tomo Bilateral  11/29/2015  CLINICAL DATA:  Right breast palpable mass felt by the patient. EXAM: DIGITAL DIAGNOSTIC BILATERAL MAMMOGRAM WITH 3D TOMOSYNTHESIS WITH CAD ULTRASOUND BILATERAL BREAST COMPARISON:  Previous exam(s). ACR Breast Density Category b: There are scattered areas of fibroglandular density. FINDINGS: There is a spiculated mass in the right breast slightly upper outer quadrant, middle depth, which corresponds to the area of palpable concern. A second sub  cm circumscribed nodule is seen in the right breast upper outer quadrant, anterior depth.  In the left breast there is a multifocal macro lobulated mass centered at 12 o'clock, middle depth. Interspersed suspicious calcifications are seen. The process measures mammographically 3.4 x 4.9 x 6.7 cm. A dilated duct extends to the nipple and contains microcalcifications at the level of the nipple. There is also a nodule in the left breast 1 o'clock, middle to posterior depth. Mammographic images were processed with CAD. On physical exam, there is a superficially located firm fixed mass in the right 9 o'clock breast, which corresponds to the area of palpable concern. An area of thickening is felt in the left 12 o'clock breast, middle depth. Targeted right breast ultrasound is performed, showing 9 o'clock 10 cm from the nipple hypoechoic spiculated mass with prominent hyperechoic halo which is taller than wide and abuts the skin surface. It measures 1.7 by 1.4 by 1.8 cm. There is increased internal vascularity. There is a separate hypoechoic slightly irregular nodule in the right breast 11 o'clock 1 cm from the nipple which measures 0.5 x 0.6 x 0.7 cm. Evaluation of the right axilla demonstrates a grossly abnormal lymph node in the deep right axilla which measures 1.2 by 1.4 by 1.2 cm, which demonstrates complete effacement of its hilum. It is located in close proximity to the axillary vessels, and is likely not amenable to ultrasound-guided core needle biopsy. Several other mildly abnormal lymph nodes with cortical thickening of up to 0.4 cm are seen in the lower right axilla. Targeted left breast ultrasound demonstrates a multifocal fragmented mass centered in the left 12 o'clock breast. The main component of the mass is a hypoechoic irregular taller than wide mass which measures 1.3 by 1.1 by 2.1 cm located at 12 o'clock 7 cm from the nipple. Slightly superior to it at 12 o'clock 9 cm from the nipple, there is a second discrete mass measuring 1.6 by 0.7 by 1.9 cm. Other suspicious satellite nodules are seen  in the adjacent breast parenchyma. There is a separate microcalcifications containing suspicious nodule in the left breast 1 o'clock 10 cm from the nipple measuring 0.7 by 0.8 by 0.7 cm. There is a dilated duct extending from the 12 o'clock process to the nipple containing microcalcifications. Evaluation of the left axilla demonstrates a grossly abnormal lymph node with cortical thickness of up to 1 cm and abnormal cortical vascularity in the lower left axilla. A second indeterminate lymph node is seen in the axilla as well. IMPRESSION: Right breast 9 o'clock suspicious mass with right axillary lymphadenopathy. Right breast 11 o'clock 7 mm indeterminate nodule. Left breast 12 o'clock multifocal suspicious mass with ductal ectasia to the level of the nipple, likely involved by disease, and associated left axillary lymphadenopathy. Left breast 1 o'clock 8 mm suspicious nodule. RECOMMENDATION: Ultrasound-guided core needle biopsy of the right breast 9 o'clock index mass, left breast 12 o'clock index mass, and left axillary lymph node. The grossly abnormal right axillary lymph node may not be amenable to ultrasound-guided core needle biopsy. I have discussed the findings and recommendations with the patient. Results were also provided in writing at the conclusion of the visit. If applicable, a reminder letter will be sent to the patient regarding the next appointment. BI-RADS CATEGORY  5: Highly suggestive of malignancy. Electronically Signed   By: Fidela Salisbury M.D.   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 Grade II INVASIVE DUCTAL CARCINOMA of the Left breast at the 11:30 o'clock location. CARCINOMA CONSISTENT WITH METASTATIC CARCINOMA of the Left axillary lymph node. The core biopsies show extensive ductal  carcinoma with no residual lymph node tissue identified. This may represent a lymph node completely replaced by tumor. DUCTAL CARCINOMA of the retroareolar area of the Left breast. Some of the tumor appears in situ and may partially involve a papilloma. The specimen however is fragmented and an invasive component cannot be ruled out. This was found to be concordant by Dr. Franki Cabot. Pathology results were discussed with the patient via telephone. She reported tenderness at the biopsy sites and is doing well otherwise. Post biopsy care and instructions were reviewed and questions were answered. She was encouraged to contact [facility] with any additional questions and or concerns. The patient was referred to [facility] on December 20, 2015. Pathology results reported by Terie Purser RN on December 13, 2015. Electronically Signed   By: Franki Cabot M.D.   On: 12/13/2015 14:08  12/13/2015  CLINICAL DATA:  Patient with bilateral breast masses presents today for ultrasound-guided biopsies. EXAM: ULTRASOUND GUIDED RIGHT BREAST CORE NEEDLE BIOPSY ULTRASOUND-GUIDED LEFT BREAST CORE NEEDLE BIOPSIES X2 ULTRASOUND-GUIDED LEFT AXILLA LYMPH NODE BIOPSY COMPARISON:  Previous exam(s). PROCEDURE: I met with the patient and we discussed the procedure of ultrasound-guided biopsy, including benefits and alternatives. We discussed the high likelihood of a successful procedure. We discussed the risks of the procedure including infection, bleeding, tissue injury, clip migration, and inadequate sampling. Informed written consent was given. The usual time-out protocol was performed immediately prior to the procedure. Using sterile technique and 1% lidocaine as local anesthetic, under direct ultrasound visualization, a 12 gauge spring-loaded device was used to perform biopsy of the irregular mass within the outer RIGHT breast, 9:30  o'clock axis,using a lateral approach. At the conclusion of the procedure, a coil shaped tissue marker clip was deployed into the biopsy cavity. Next, using sterile technique and 1% lidocaine as local anesthetic, under direct ultrasound visualization, a 12 gauge spring-loaded device was used to perform biopsy of the irregular mass within the upper central LEFT breast, 11:30 o'clock axis, using a lateral approach. At the conclusion of the procedure, a coil shaped tissue marker clip was deployed into the biopsy site. Next, using sterile technique and 1% lidocaine as local anesthetic, under direct ultrasound visualization, a 14 gauge spring-loaded device was used to perform biopsy of the enlarged lymph node in the LEFT axilla using a lateral approach. At the conclusion of the procedure, a HydroMARK clip was placed into the lymph node. Next, using sterile technique and 1% lidocaine as local anesthetic, under direct ultrasound visualization, a 12 gauge spring-loaded device was used to perform biopsy of the LEFT breast dilated retroareolar duct using a lateral approach. At the conclusion of the procedure, a heart shaped clip was deployed into the biopsy site. IMPRESSION: 1. Ultrasound-guided biopsy of the irregular mass within the outer RIGHT breast, 9:30 o'clock axis, with coil shaped tissue marker clip placed at the biopsy site. 2. Ultrasound-guided biopsy of the irregular mass within the upper central LEFT breast, 11:30 o'clock axis, with coil shaped tissue marker clip placed at the biopsy site. 3. Ultrasound-guided biopsy of the enlarged lymph node in the LEFT  axilla with a HydroMARK clip placed into the lymph node. 4. Ultrasound-guided biopsy of the dilated LEFT retroareolar duct with heart shaped clip placed at the biopsy site. No apparent complications. Electronically Signed: By: Franki Cabot M.D. 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 Grade II INVASIVE DUCTAL CARCINOMA of the Left breast at the 11:30 o'clock location. CARCINOMA CONSISTENT WITH METASTATIC CARCINOMA of the Left axillary lymph node. The core biopsies show extensive ductal carcinoma with no residual lymph node tissue identified. This may represent a lymph node completely replaced by tumor. DUCTAL CARCINOMA of the retroareolar area of the Left breast. Some of the tumor appears in situ and may partially involve a papilloma. The specimen however is fragmented and an invasive component cannot be ruled out. This was found to be concordant by Dr. Franki Cabot. Pathology results were discussed with the patient via telephone. She reported tenderness at the biopsy sites and is doing well otherwise. Post biopsy care and instructions were reviewed and questions were answered. She was encouraged to contact [facility] with any additional questions and or concerns. The patient was referred to [facility] at [facility] on December 20, 2015. Pathology results reported by Terie Purser RN on December 13, 2015. Electronically Signed   By: Franki Cabot M.D.   On: 12/13/2015 14:08  12/13/2015  CLINICAL DATA:  Patient with bilateral breast masses presents today for ultrasound-guided biopsies. EXAM: ULTRASOUND GUIDED RIGHT BREAST CORE NEEDLE BIOPSY ULTRASOUND-GUIDED LEFT BREAST CORE NEEDLE BIOPSIES X2 ULTRASOUND-GUIDED LEFT AXILLA LYMPH NODE BIOPSY COMPARISON:  Previous exam(s). PROCEDURE: I met with the patient and we discussed the procedure of ultrasound-guided biopsy, including benefits and alternatives. We discussed the high likelihood of a successful procedure. We discussed the risks of the procedure including infection, bleeding, tissue injury, clip migration, and inadequate  sampling. Informed written consent was given. The usual time-out protocol was performed immediately prior to the procedure. Using sterile technique and 1% lidocaine as local anesthetic, under direct ultrasound visualization, a 12 gauge spring-loaded device was used to perform biopsy of the irregular mass within the outer RIGHT breast, 9:30 o'clock axis,using a lateral approach. At the conclusion of the procedure, a coil shaped tissue marker clip was deployed into the biopsy cavity. Next, using sterile technique and 1% lidocaine as local anesthetic, under direct ultrasound visualization, a 12 gauge spring-loaded device was used to perform biopsy of the irregular mass within the upper central LEFT breast, 11:30 o'clock axis, using a lateral approach. At the conclusion of the procedure, a coil shaped tissue marker clip was deployed into the biopsy site. Next, using sterile technique and 1% lidocaine as local anesthetic, under direct ultrasound visualization, a 14 gauge spring-loaded device was used to perform biopsy of the enlarged lymph node in the LEFT axilla using a lateral approach. At the conclusion of the procedure, a HydroMARK clip was placed into the lymph node. Next, using sterile technique and 1% lidocaine as local anesthetic, under direct ultrasound visualization, a 12 gauge spring-loaded device was used to perform biopsy of the LEFT breast dilated retroareolar duct using a lateral approach. At the conclusion of the procedure, a heart shaped clip was deployed into the biopsy site. IMPRESSION: 1. Ultrasound-guided biopsy of the  irregular mass within the outer RIGHT breast, 9:30 o'clock axis, with coil shaped tissue marker clip placed at the biopsy site. 2. Ultrasound-guided biopsy of the irregular mass within the upper central LEFT breast, 11:30 o'clock axis, with coil shaped tissue marker clip placed at the biopsy site. 3. Ultrasound-guided biopsy of the enlarged lymph node in the LEFT axilla with a  HydroMARK clip placed into the lymph node. 4. Ultrasound-guided biopsy of the dilated LEFT retroareolar duct with heart shaped clip placed at the biopsy site. No apparent complications. Electronically Signed: By: Franki Cabot M.D. 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 Grade II INVASIVE DUCTAL CARCINOMA of the Left breast at the 11:30 o'clock location. CARCINOMA CONSISTENT WITH METASTATIC CARCINOMA of the Left axillary lymph node. The core biopsies show extensive ductal carcinoma with no residual lymph node tissue identified. This may represent a lymph node completely replaced by tumor. DUCTAL CARCINOMA of the retroareolar area of the Left breast. Some of the tumor appears in situ and may partially involve a papilloma. The specimen however is fragmented and an invasive component cannot be ruled out. This was found to be concordant by Dr. Franki Cabot. Pathology results were discussed with the patient via telephone. She reported tenderness at the biopsy sites and is doing well otherwise. Post biopsy care and instructions were reviewed and questions were answered. She was encouraged to contact [facility] with any additional questions and or concerns. The patient was referred to [facility] at [facility] on December 20, 2015. Pathology results reported by Terie Purser RN on December 13, 2015. Electronically Signed   By: Franki Cabot M.D.   On: 12/13/2015 14:08  12/13/2015  CLINICAL DATA:  Patient with bilateral breast masses presents today for ultrasound-guided biopsies. EXAM: ULTRASOUND GUIDED RIGHT BREAST CORE NEEDLE BIOPSY ULTRASOUND-GUIDED LEFT BREAST CORE NEEDLE BIOPSIES X2 ULTRASOUND-GUIDED LEFT AXILLA LYMPH NODE  BIOPSY COMPARISON:  Previous exam(s). PROCEDURE: I met with the patient and we discussed the procedure of ultrasound-guided biopsy, including benefits and alternatives. We discussed the high likelihood of a successful procedure. We discussed the risks of the procedure including infection, bleeding, tissue injury, clip migration, and inadequate sampling. Informed written consent was given. The usual time-out protocol was performed immediately prior to the procedure. Using sterile technique and 1% lidocaine as local anesthetic, under direct ultrasound visualization, a 12 gauge spring-loaded device was used to perform biopsy of the irregular mass within the outer RIGHT breast, 9:30 o'clock axis,using a lateral approach. At the conclusion of the procedure, a coil shaped tissue marker clip was deployed into the biopsy cavity. Next, using sterile technique and 1% lidocaine as local anesthetic, under direct ultrasound visualization, a 12 gauge spring-loaded device was used to perform biopsy of the irregular mass within the upper central LEFT breast, 11:30 o'clock axis, using a lateral approach. At the conclusion of the procedure, a coil shaped tissue marker clip was deployed into the biopsy site. Next, using sterile technique and 1% lidocaine as local anesthetic, under direct ultrasound visualization, a 14 gauge spring-loaded device was used to perform biopsy of the enlarged lymph node in the LEFT axilla using a lateral approach. At the conclusion of the procedure, a HydroMARK clip was placed into the  lymph node. Next, using sterile technique and 1% lidocaine as local anesthetic, under direct ultrasound visualization, a 12 gauge spring-loaded device was used to perform biopsy of the LEFT breast dilated retroareolar duct using a lateral approach. At the conclusion of the procedure, a heart shaped clip was deployed into the biopsy site. IMPRESSION: 1. Ultrasound-guided biopsy of the irregular mass within the outer RIGHT  breast, 9:30 o'clock axis, with coil shaped tissue marker clip placed at the biopsy site. 2. Ultrasound-guided biopsy of the irregular mass within the upper central LEFT breast, 11:30 o'clock axis, with coil shaped tissue marker clip placed at the biopsy site. 3. Ultrasound-guided biopsy of the enlarged lymph node in the LEFT axilla with a HydroMARK clip placed into the lymph node. 4. Ultrasound-guided biopsy of the dilated LEFT retroareolar duct with heart shaped clip placed at the biopsy site. No apparent complications. Electronically Signed: By: Franki Cabot M.D. 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 Grade II INVASIVE DUCTAL CARCINOMA of the Left breast at the 11:30 o'clock location. CARCINOMA CONSISTENT WITH METASTATIC CARCINOMA of the Left axillary lymph node. The core biopsies show extensive ductal carcinoma with no residual lymph node tissue identified. This may represent a lymph node completely replaced by tumor. DUCTAL CARCINOMA of the retroareolar area of the Left breast. Some of the tumor appears in situ and may partially involve a papilloma. The specimen however is fragmented and an invasive component cannot be ruled out. This was found to be concordant by Dr. Franki Cabot. Pathology results were discussed with the patient via telephone. She reported tenderness at the biopsy sites and is doing well otherwise. Post biopsy care and instructions were reviewed and questions were answered. She was encouraged to contact [facility] with any additional questions and or concerns. The patient was referred to [facility] at [facility] on December 20, 2015. Pathology results reported by Terie Purser RN on December 13, 2015.  Electronically Signed   By: Franki Cabot M.D.   On: 12/13/2015 14:08  12/13/2015  CLINICAL DATA:  Patient with bilateral breast masses presents today for ultrasound-guided biopsies. EXAM: ULTRASOUND GUIDED RIGHT BREAST CORE NEEDLE BIOPSY ULTRASOUND-GUIDED LEFT BREAST CORE NEEDLE BIOPSIES X2 ULTRASOUND-GUIDED LEFT AXILLA LYMPH NODE BIOPSY COMPARISON:  Previous exam(s). PROCEDURE: I met with the patient and we discussed the procedure of ultrasound-guided biopsy, including benefits and alternatives. We discussed the high likelihood of a successful procedure. We discussed the risks of the procedure including infection, bleeding, tissue injury, clip migration, and inadequate sampling. Informed written consent was given. The usual time-out protocol was performed immediately prior to the procedure. Using sterile technique and 1% lidocaine as local anesthetic, under direct ultrasound visualization, a 12 gauge spring-loaded device was used to perform biopsy of the irregular mass within the outer RIGHT breast, 9:30 o'clock axis,using a lateral approach. At the conclusion of the procedure, a coil shaped tissue marker clip was deployed into the biopsy cavity. Next, using sterile technique and 1% lidocaine as local anesthetic, under direct ultrasound visualization, a 12 gauge spring-loaded device was used to perform biopsy of the irregular mass within the upper central LEFT breast, 11:30 o'clock axis, using a lateral approach. At the conclusion of the procedure, a coil shaped tissue  marker clip was deployed into the biopsy site. Next, using sterile technique and 1% lidocaine as local anesthetic, under direct ultrasound visualization, a 14 gauge spring-loaded device was used to perform biopsy of the enlarged lymph node in the LEFT axilla using a lateral approach. At the conclusion of the procedure, a HydroMARK clip was placed into the lymph node. Next, using sterile technique and 1% lidocaine as local anesthetic, under direct  ultrasound visualization, a 12 gauge spring-loaded device was used to perform biopsy of the LEFT breast dilated retroareolar duct using a lateral approach. At the conclusion of the procedure, a heart shaped clip was deployed into the biopsy site. IMPRESSION: 1. Ultrasound-guided biopsy of the irregular mass within the outer RIGHT breast, 9:30 o'clock axis, with coil shaped tissue marker clip placed at the biopsy site. 2. Ultrasound-guided biopsy of the irregular mass within the upper central LEFT breast, 11:30 o'clock axis, with coil shaped tissue marker clip placed at the biopsy site. 3. Ultrasound-guided biopsy of the enlarged lymph node in the LEFT axilla with a HydroMARK clip placed into the lymph node. 4. Ultrasound-guided biopsy of the dilated LEFT retroareolar duct with heart shaped clip placed at the biopsy site. No apparent complications. Electronically Signed: By: Franki Cabot M.D. On: 12/12/2015 16:53    ASSESSMENT: 70 y.o. [place] woman  (1) status post bilateral breast biopsies and left axillary lymph node biopsy 12/12/2015, showing  (a) on the Right, a clinical mT1c N1, stage IIA, estrogen and progesterone receptor positive, HER-2 not amplified, with an MIB-1 of 20%  (b) on the left a clinical mT2 pN1, stage IIB invasive ductal carcinoma, grade 2 over 3, estrogen and progesterone receptor positive, HER-2 not amplified, with MIB-1 between 5 and 40%  (2) pending bilateral mastctomies with left ALND and right SLN samp,ling  (3) oncotype/mammapring to be sent from definitive surgical samples  (4) chemotherapy to follow as appropriate  (5) radiation to follow as appropriate  (6) anti-estrogens to follow radiation  PLAN: We spent the better part of today's hour-long appointment discussing the biology of breast cancer in general, and the specifics of the patient's tumor in particular. Kristen Howe understands from the point of view of survival there is no difference between mastectomy and  lumpectomy followed by radiation. However given the extent of disease on the left, it appears mastectomy on the left side will be needed, and since we are ready have a positive lymph node pathologically documented on the left side she will have a left modified radical mastectomy.  A lumpectomy might be possible on the right, although the cosmetic result might be questionable. However at this point the patient is highly desirous of obtaining bilateral mastectomies for symmetry and for peace of mind. This seems reasonable to [place] and so on the right side the plan will be for simple mastectomy with sentinel lymph node sampling.  By is very likely to require adjuvant chemotherapy, but given the information from the prognostic panels, we will obtain Oncotype or more likely Mammaprint from both sides to help [place] with the chemotherapy decision. She will need postmastectomy radiation at least on the left side. She will then benefit from antiestrogen therapy after the completion of local treatment.  This was a lot of information for Kristen Howe and I wrote down for her the main points. Nevertheless she was very anxious and she will need to review this material again when we meet so that we can make the best decision for her regarding adjuvant therapy.accordingly she will see  me again mid February, by which time we will have the final results from her surgery.  Before then she will be staged with a bone scan and chest CT scan.if there is evidence of stage IV disease we will have to change the treatments accordingly.  Kristen Howe agrees with this plan. She knows the goal of treatment in her case is cure. She will call with any problems that may develop before her next visit here.  Kristen Cruel, MD   12/22/2015 2:36 PM Medical Oncology and Hematology [facility] [address] [place], [place] [address] Tel. [phone]    Fax. [phone]

## 2015-12-20 NOTE — Therapy (Signed)
[place], [place], [address] Phone: [phone]   Fax:  [phone]  Physical Therapy Evaluation  Patient Details  Name: Kristen Howe MRN: [id number] Date of Birth: 11-05-1946 Referring Provider: Dr. Stark Klein  Encounter Date: 12/20/2015      PT End of Session - 12/20/15 1631    Visit Number 1   Number of Visits 1   PT Start Time 6286   PT Stop Time 1600   PT Time Calculation (min) 25 min   Activity Tolerance Patient tolerated treatment well   Behavior During Therapy [facility] for tasks assessed/performed      Past Medical History  Diagnosis Date  . Hypertension   . Hyperlipidemia   . Pneumonia   . GERD (gastroesophageal reflux disease)   . Headache     migraines occasionally  . Arthritis   . Leg cramps     takes Flexeril  . Anemia     as a child  . Primary cancer of 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 breast ([place]) 12/14/2015  . Breast cancer [facility])     Past Surgical History  Procedure Laterality Date  . Cesarean section    . Eye surgery Bilateral     cataract surgery with lens implant,   . Eye surgery      for cross eyes as a child  . Tonsillectomy    . Umbilical hernia repair N/A 09/12/2015    Procedure: REPAIR OF INCISIONAL AND UMBILICAL HERNIA;  Surgeon: Erroll Luna, MD;  Location: [place];  Service: General;  Laterality: N/A;  . Insertion of mesh N/A 09/12/2015    Procedure: INSERTION OF MESH;  Surgeon: Erroll Luna, MD;  Location: [place];  Service: General;  Laterality: N/A;    There were no vitals filed for this visit.  Visit Diagnosis:  Carcinoma of upper-outer quadrant of right female breast ([place]) - Plan: PT plan of care cert/re-cert  Carcinoma of left breast upper inner quadrant ([place]) - Plan: PT plan of care cert/re-cert  Abnormal posture - Plan: PT plan of care cert/re-cert  Shoulder stiffness, left - Plan: PT plan of care cert/re-cert      Subjective Assessment  - 12/20/15 1619    Subjective Patient was seen today for a baseline assessment of her newly diagnosed bilateral breast cancer.   Patient is accompained by: Family member   Pertinent History Patient was diagnosed with bilateral breast cancer on 11/29/15.  Both breast cancers are ER/PR positive, HER2 negative.  Her right breast cancer is located in the upper outer quadrant and there are 2 areas 3.7 cm away, one measuring 1.8 cm and the other measuring 7 mm.  The Ki67 is 20% on the right side.  In the left breast, the mass measures 2.1 cm in the upper inner quadrant with a Ki67 of 40%.  It is axillary node positive.   Patient Stated Goals reduce lymphedema risk and learn post op shoulder ROM HEP   Currently in Pain? No/denies            [facility] PT Assessment - 12/20/15 0001    Assessment   Medical Diagnosis Bilateral breast cancer   Referring Provider Dr. Stark Klein   Onset Date/Surgical Date 11/29/15   Hand Dominance Right   Prior Therapy none   Precautions   Precautions Other (comment)   Precaution Comments Active bilateral breast cancer   Restrictions   Weight Bearing Restrictions No   Balance Screen   Has the  patient fallen in the past 6 months No   Has the patient had a decrease in activity level because of a fear of falling?  No   Is the patient reluctant to leave their home because of a fear of falling?  No   Home Ec[employer] residence   Living Arrangements Other relatives  Lives with mom, sister, and 2 nephews   Available Help at Discharge Family   Type of Home House   Prior Function   Level of [place] Retired   Leisure She does not exercise   Cognition   Overall Cognitive Status Within Functional Limits for tasks assessed   Posture/Postural Control   Posture/Postural Control Postural limitations   Postural Limitations Rounded Shoulders;Forward head   ROM / Strength   AROM / PROM / Strength AROM;Strength   AROM    AROM Assessment Site Shoulder   Right/Left Shoulder Right;Left   Right Shoulder Extension 42 Degrees   Right Shoulder Flexion 124 Degrees   Right Shoulder ABduction 141 Degrees   Right Shoulder Internal Rotation 57 Degrees   Right Shoulder External Rotation 85 Degrees   Left Shoulder Extension 50 Degrees   Left Shoulder Flexion 108 Degrees  limited by tightness   Left Shoulder ABduction 132 Degrees   Left Shoulder Internal Rotation 60 Degrees   Left Shoulder External Rotation 86 Degrees   Strength   Overall Strength Within functional limits for tasks performed           LYMPHEDEMA/ONCOLOGY QUESTIONNAIRE - 12/20/15 1630    Type   Cancer Type Bilateral breast cancer   Lymphedema Assessments   Lymphedema Assessments Upper extremities   Right Upper Extremity Lymphedema   10 cm Proximal to Olecranon Process 29.8 cm   Olecranon Process 24.8 cm   10 cm Proximal to Ulnar Styloid Process 20.6 cm   Just Proximal to Ulnar Styloid Process 15 cm   Across Hand at [employer] 17.6 cm   At [place] of 2nd Digit 6.4 cm   Left Upper Extremity Lymphedema   10 cm Proximal to Olecranon Process 31.5 cm   Olecranon Process 24.3 cm   10 cm Proximal to Ulnar Styloid Process 20.4 cm   Just Proximal to Ulnar Styloid Process 14.9 cm   Across Hand at [employer] 16.1 cm   At [place] of 2nd Digit 5.9 cm      Patient was instructed today in a home exercise program today for post op shoulder range of motion. These included active assist shoulder flexion in sitting, scapular retraction, wall walking with shoulder abduction, and hands behind head external rotation.  She was encouraged to do these twice a day, holding 3 seconds and repeating 5 times when permitted by her physician.         PT Education - 12/20/15 1631    Education provided Yes   Education Details Lymphedema risk reduction and post op shoulder ROM HEP   Person(s) Educated Patient   Methods Explanation;Demonstration;Handout    Comprehension Verbalized understanding;Returned demonstration              Breast Clinic Goals - 12/20/15 1638    Patient will be able to verbalize understanding of pertinent lymphedema risk reduction practices relevant to her diagnosis specifically related to skin care.   Time 1   Period Days   Status Achieved   Patient will be able to return demonstrate and/or verbalize understanding of the post-op home exercise program  related to regaining shoulder range of motion.   Time 1   Period Days   Status Achieved   Patient will be able to verbalize understanding of the importance of attending the postoperative After Breast Cancer Class for further lymphedema risk reduction education and therapeutic exercise.   Time 1   Period Days   Status Achieved              Plan - 01-19-16 1633    Clinical Impression Statement Patient was diagnosed with bilateral breast cancer on 11/29/15.  Both breast cancers are ER/PR positive, HER2 negative.  Her right breast cancer is located in the upper outer quadrant and there are 2 areas 3.7 cm away, one measuring 1.8 cm and the other measuring 7 mm.  The Ki67 is 20% on the right side.  In the left breast, the mass measures 2.1 cm in the upper inner quadrant with a Ki67 of 40%.  It is axillary node positive  She is planning to have a bilateral mastectomy with a bilateral axillary node dissection (likely) followed by Oncotype testing on the right and Mammaprint testing on the left to determine the need for chemotherapy.  She will likely undergo bilateral radiation if nodes on the right are positive and if not, radiation on the right.  She will benefit from post op physical therapy to regain shoulder ROM and strength and reduce her risk of lymphedema as she will be at very high risk due to her BMI, smoking history and having a node dissection.   Pt will benefit from skilled therapeutic intervention in order to improve on the following deficits Decreased  strength;Decreased knowledge of precautions;Pain;Decreased range of motion;Impaired UE functional use   Rehab Potential Good   Clinical Impairments Affecting Rehab Potential Will likely undergo extensive axillary surgery bilaterally   PT Frequency One time visit   PT Treatment/Interventions Therapeutic exercise;Patient/family education   PT Next Visit Plan Will f/u after surgery   Consulted and Agree with Plan of Care Patient;Family member/caregiver   Family Member Consulted Sister Joycelyn Schmid     Patient will follow up at outpatient cancer rehab if needed following surgery.  If the patient requires physical therapy at that time, a specific plan will be dictated and sent to the referring physician for approval. The patient was educated today on appropriate basic range of motion exercises to begin post operatively and the importance of attending the After Breast Cancer class following surgery.  Patient was educated today on lymphedema risk reduction practices as it pertains to recommendations that will benefit the patient immediately following surgery.  She verbalized good understanding.  No additional physical therapy is indicated at this time.         G-Codes - 01-19-16 1638    Functional Assessment Tool Used Clinical Judgement   Functional Limitation Other PT primary   Other PT Primary Current Status (V7616) At least 20 percent but less than 40 percent impaired, limited or restricted   Other PT Primary Goal Status (W7371) At least 20 percent but less than 40 percent impaired, limited or restricted   Other PT Primary Discharge Status (G6269) At least 20 percent but less than 40 percent impaired, limited or restricted       Problem List Patient Active Problem List   Diagnosis Date Noted  . Primary cancer of 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 breast ([place]) 12/14/2015  . Breast cancer of upper-outer quadrant of right female breast ([place]) 12/14/2015  . Lump in female breast 11/20/2015  .  Umbilical hernia without obstruction and without gangrene 07/28/2015  . Essential hypertension 07/28/2015  . Seborrheic keratosis 07/28/2015    Annia Friendly, PT 12/20/2015 4:43 PM  [place], [place], [address] Phone: [phone]   Fax:  [phone]  Name: Fawne Hughley MRN: [id number] Date of Birth: [birth year]

## 2015-12-22 DIAGNOSIS — Z87891 Personal history of nicotine dependence: Secondary | ICD-10-CM | POA: Insufficient documentation

## 2015-12-22 DIAGNOSIS — Z720 Tobacco use: Secondary | ICD-10-CM | POA: Insufficient documentation

## 2015-12-23 ENCOUNTER — Other Ambulatory Visit: Payer: Self-pay | Admitting: Oncology

## 2015-12-23 DIAGNOSIS — C50411 Malignant neoplasm of upper-outer quadrant of right female breast: Secondary | ICD-10-CM

## 2015-12-24 ENCOUNTER — Telehealth: Payer: Self-pay | Admitting: Oncology

## 2015-12-24 NOTE — Telephone Encounter (Signed)
Called and spoke with patient and she is aware of her lab and md visit

## 2015-12-26 ENCOUNTER — Encounter: Payer: Self-pay | Admitting: Radiation Oncology

## 2015-12-27 ENCOUNTER — Telehealth: Payer: Self-pay | Admitting: *Deleted

## 2015-12-27 NOTE — Telephone Encounter (Signed)
Spoke with patient to follow up from [facility] 12/20/15.  She is doing well.  No questions or concerns at this time.  Encouraged her to call with any needs or concerns.

## 2016-01-04 ENCOUNTER — Encounter (HOSPITAL_COMMUNITY)
Admission: RE | Admit: 2016-01-04 | Discharge: 2016-01-04 | Disposition: A | Discharge status: Home / Self Care | Payer: Medicare Other | Source: Ambulatory Visit | Attending: Oncology | Admitting: Oncology

## 2016-01-04 ENCOUNTER — Ambulatory Visit (HOSPITAL_COMMUNITY)
Admission: RE | Admit: 2016-01-04 | Discharge: 2016-01-04 | Disposition: A | Discharge status: Home / Self Care | Payer: Medicare Other | Source: Ambulatory Visit | Attending: Oncology | Admitting: Oncology

## 2016-01-04 DIAGNOSIS — N63 Unspecified lump in breast: Secondary | ICD-10-CM | POA: Insufficient documentation

## 2016-01-04 DIAGNOSIS — C50411 Malignant neoplasm of upper-outer quadrant of right female breast: Secondary | ICD-10-CM | POA: Diagnosis not present

## 2016-01-04 DIAGNOSIS — C50919 Malignant neoplasm of unspecified site of unspecified female breast: Secondary | ICD-10-CM | POA: Diagnosis not present

## 2016-01-04 DIAGNOSIS — K229 Disease of esophagus, unspecified: Secondary | ICD-10-CM | POA: Diagnosis not present

## 2016-01-04 DIAGNOSIS — R590 Localized enlarged lymph nodes: Secondary | ICD-10-CM | POA: Insufficient documentation

## 2016-01-04 MED ORDER — IOHEXOL 300 MG/ML  SOLN
75.0000 mL | Freq: Once | INTRAMUSCULAR | Status: AC | PRN
  Administered 2016-01-04: 75 mL via INTRAVENOUS

## 2016-01-04 MED ORDER — TECHNETIUM TC 99M MEDRONATE IV KIT
26.0000 | PACK | Freq: Once | INTRAVENOUS | Status: AC | PRN
  Administered 2016-01-04: 26 via INTRAVENOUS

## 2016-01-16 NOTE — Pre-Procedure Instructions (Signed)
    Kristen Howe  01/16/2016      [facility] - [place], [place] - [address] [place] [place] [address] Phone: [phone] Fax: [phone]    Your procedure is scheduled on Thursday, Feb. 9  Report to [facility] Admitting at 5:30 A.M.  Call this number if you have problems the morning of surgery:  [phone]   Remember:  Do not eat food or drink liquids after midnight Feb. 8 Wednesday  Take these medicines the morning of surgery with A SIP OF WATER : artificial tears, flexeril if needed,  protonix               Stop ibuprofen, advil, aleve, BC'S goody's, herbal medicines/vitamines 1 week prior to surgery.   Do not wear jewelry, make-up or nail polish.  Do not wear lotions, powders, or perfumes.  You may not  wear deodorant.  Do not shave 48 hours prior to surgery.  Men may shave face and neck.  Do not bring valuables to the hospital.  [facility] is not responsible for any belongings or valuables.  Contacts, dentures or bridgework may not be worn into surgery.  Leave your suitcase in the car.  After surgery it may be brought to your room.  For patients admitted to the hospital, discharge time will be determined by your treatment team.  Name and phone number of your driver:    Special instructions: review handouts  Please read over the following fact sheets that you were given. Pain Booklet, Coughing and Deep Breathing and Surgical Site Infection Prevention

## 2016-01-17 ENCOUNTER — Encounter (HOSPITAL_COMMUNITY): Payer: Self-pay

## 2016-01-17 ENCOUNTER — Encounter (HOSPITAL_COMMUNITY)
Admission: RE | Admit: 2016-01-17 | Discharge: 2016-01-17 | Disposition: A | Discharge status: Home / Self Care | Payer: Medicare Other | Source: Ambulatory Visit | Attending: General Surgery | Admitting: General Surgery

## 2016-01-17 DIAGNOSIS — C50911 Malignant neoplasm of unspecified site of right female breast: Secondary | ICD-10-CM | POA: Diagnosis not present

## 2016-01-17 DIAGNOSIS — Z01812 Encounter for preprocedural laboratory examination: Secondary | ICD-10-CM | POA: Diagnosis not present

## 2016-01-17 DIAGNOSIS — C50912 Malignant neoplasm of unspecified site of left female breast: Secondary | ICD-10-CM | POA: Diagnosis not present

## 2016-01-17 LAB — CBC
HCT: 40.6 % (ref 36.0–46.0)
Hemoglobin: 13.6 g/dL (ref 12.0–15.0)
MCH: 28.8 pg (ref 26.0–34.0)
MCHC: 33.5 g/dL (ref 30.0–36.0)
MCV: 86 fL (ref 78.0–100.0)
PLATELETS: 291 10*3/uL (ref 150–400)
RBC: 4.72 MIL/uL (ref 3.87–5.11)
RDW: 13.9 % (ref 11.5–15.5)
WBC: 7.6 10*3/uL (ref 4.0–10.5)

## 2016-01-17 LAB — BASIC METABOLIC PANEL
Anion gap: 12 (ref 5–15)
BUN: 14 mg/dL (ref 6–20)
CALCIUM: 9.6 mg/dL (ref 8.9–10.3)
CHLORIDE: 107 mmol/L (ref 101–111)
CO2: 23 mmol/L (ref 22–32)
CREATININE: 0.83 mg/dL (ref 0.44–1.00)
GFR calc non Af Amer: >60 mL/min (ref >60)
Glucose, Bld: 137 mg/dL — ABNORMAL HIGH (ref 65–99)
Potassium: 4.1 mmol/L (ref 3.5–5.1)
SODIUM: 142 mmol/L (ref 135–145)

## 2016-01-17 LAB — URINALYSIS, ROUTINE W REFLEX MICROSCOPIC
Bilirubin Urine: NEGATIVE
Glucose, UA: NEGATIVE mg/dL
Hgb urine dipstick: NEGATIVE
Ketones, ur: NEGATIVE mg/dL
Nitrite: NEGATIVE
PROTEIN: NEGATIVE mg/dL
Specific Gravity, Urine: 1.022 (ref 1.005–1.030)
pH: 6 (ref 5.0–8.0)

## 2016-01-17 LAB — URINE MICROSCOPIC-ADD ON

## 2016-01-17 NOTE — Progress Notes (Signed)
PCP: Dr.Gregory Calone at [employer] [facility]

## 2016-01-24 MED ORDER — CEFAZOLIN SODIUM-DEXTROSE 2-3 GM-% IV SOLR
2.0000 g | INTRAVENOUS | Status: AC
  Administered 2016-01-25: 2 g via INTRAVENOUS
  Filled 2016-01-24: qty 50

## 2016-01-25 ENCOUNTER — Ambulatory Visit (HOSPITAL_COMMUNITY)
Admission: RE | Admit: 2016-01-25 | Discharge: 2016-01-26 | Disposition: A | Discharge status: Home / Self Care | Payer: Medicare Other | Source: Ambulatory Visit | Attending: General Surgery | Admitting: General Surgery

## 2016-01-25 ENCOUNTER — Encounter (HOSPITAL_COMMUNITY): Payer: Self-pay | Admitting: *Deleted

## 2016-01-25 ENCOUNTER — Encounter (HOSPITAL_COMMUNITY)
Admission: RE | Admit: 2016-01-25 | Discharge: 2016-01-25 | Disposition: A | Discharge status: Home / Self Care | Payer: Medicare Other | Source: Ambulatory Visit | Attending: General Surgery | Admitting: General Surgery

## 2016-01-25 ENCOUNTER — Encounter (HOSPITAL_COMMUNITY)
Admission: RE | Disposition: A | Discharge status: Home / Self Care | Payer: Self-pay | Source: Ambulatory Visit | Attending: General Surgery

## 2016-01-25 ENCOUNTER — Ambulatory Visit (HOSPITAL_COMMUNITY): Payer: Medicare Other | Admitting: Anesthesiology

## 2016-01-25 DIAGNOSIS — F17210 Nicotine dependence, cigarettes, uncomplicated: Secondary | ICD-10-CM | POA: Diagnosis not present

## 2016-01-25 DIAGNOSIS — G8918 Other acute postprocedural pain: Secondary | ICD-10-CM | POA: Diagnosis not present

## 2016-01-25 DIAGNOSIS — Z170 Estrogen receptor positive status [ER+]: Secondary | ICD-10-CM | POA: Insufficient documentation

## 2016-01-25 DIAGNOSIS — C50411 Malignant neoplasm of upper-outer quadrant of right female breast: Secondary | ICD-10-CM | POA: Insufficient documentation

## 2016-01-25 DIAGNOSIS — C50912 Malignant neoplasm of unspecified site of left female breast: Secondary | ICD-10-CM

## 2016-01-25 DIAGNOSIS — I10 Essential (primary) hypertension: Secondary | ICD-10-CM | POA: Insufficient documentation

## 2016-01-25 DIAGNOSIS — C50911 Malignant neoplasm of unspecified site of right female breast: Secondary | ICD-10-CM | POA: Diagnosis not present

## 2016-01-25 DIAGNOSIS — C773 Secondary and unspecified malignant neoplasm of axilla and upper limb lymph nodes: Secondary | ICD-10-CM | POA: Insufficient documentation

## 2016-01-25 DIAGNOSIS — C7981 Secondary malignant neoplasm of breast: Secondary | ICD-10-CM | POA: Diagnosis not present

## 2016-01-25 DIAGNOSIS — C50212 Malignant neoplasm of upper-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 breast: Secondary | ICD-10-CM | POA: Insufficient documentation

## 2016-01-25 DIAGNOSIS — Z79899 Other long term (current) drug therapy: Secondary | ICD-10-CM | POA: Insufficient documentation

## 2016-01-25 DIAGNOSIS — Z803 Family history of malignant neoplasm of breast: Secondary | ICD-10-CM | POA: Diagnosis not present

## 2016-01-25 HISTORY — PX: MASTECTOMY MODIFIED RADICAL: SUR848

## 2016-01-25 HISTORY — PX: MASTECTOMY W/ SENTINEL NODE BIOPSY: SHX2001

## 2016-01-25 HISTORY — DX: Malignant neoplasm of unspecified site of left female breast: C50.912

## 2016-01-25 HISTORY — PX: MASTECTOMY COMPLETE / SIMPLE W/ SENTINEL NODE BIOPSY: SUR846

## 2016-01-25 HISTORY — DX: Migraine, unspecified, not intractable, without status migrainosus: G43.909

## 2016-01-25 HISTORY — DX: Rheumatic fever without heart involvement: I00

## 2016-01-25 HISTORY — DX: Malignant neoplasm of unspecified site of right female breast: C50.911

## 2016-01-25 SURGERY — MASTECTOMY WITH SENTINEL LYMPH NODE BIOPSY
Anesthesia: General | Site: Breast | Laterality: Bilateral

## 2016-01-25 MED ORDER — SUGAMMADEX SODIUM 200 MG/2ML IV SOLN
INTRAVENOUS | Status: AC
  Filled 2016-01-25: qty 2

## 2016-01-25 MED ORDER — FENTANYL CITRATE (PF) 100 MCG/2ML IJ SOLN
INTRAMUSCULAR | Status: AC
  Filled 2016-01-25: qty 2

## 2016-01-25 MED ORDER — FENTANYL CITRATE (PF) 250 MCG/5ML IJ SOLN
INTRAMUSCULAR | Status: AC
  Filled 2016-01-25: qty 5

## 2016-01-25 MED ORDER — LIDOCAINE HCL (CARDIAC) 20 MG/ML IV SOLN
INTRAVENOUS | Status: AC
  Filled 2016-01-25: qty 5

## 2016-01-25 MED ORDER — SODIUM CHLORIDE 0.9 % IJ SOLN
INTRAMUSCULAR | Status: DC | PRN
  Administered 2016-01-25: 120 mL via INTRAVENOUS

## 2016-01-25 MED ORDER — SIMETHICONE 80 MG PO CHEW: 40.0000 mg | CHEWABLE_TABLET | Freq: Four times a day (QID) | ORAL | Status: DC | PRN

## 2016-01-25 MED ORDER — BISACODYL 10 MG RE SUPP: 10.0000 mg | Freq: Every day | RECTAL | Status: DC | PRN

## 2016-01-25 MED ORDER — DIPHENHYDRAMINE HCL 12.5 MG/5ML PO ELIX: 12.5000 mg | ORAL_SOLUTION | Freq: Four times a day (QID) | ORAL | Status: DC | PRN

## 2016-01-25 MED ORDER — ACETAMINOPHEN 650 MG RE SUPP: 650.0000 mg | Freq: Four times a day (QID) | RECTAL | Status: DC | PRN

## 2016-01-25 MED ORDER — CYCLOBENZAPRINE HCL 10 MG PO TABS
10.0000 mg | ORAL_TABLET | Freq: Three times a day (TID) | ORAL | Status: DC | PRN
  Administered 2016-01-26: 10 mg via ORAL
  Filled 2016-01-25: qty 1

## 2016-01-25 MED ORDER — EPHEDRINE SULFATE 50 MG/ML IJ SOLN
INTRAMUSCULAR | Status: AC
  Filled 2016-01-25: qty 1

## 2016-01-25 MED ORDER — ONDANSETRON HCL 4 MG/2ML IJ SOLN
4.0000 mg | Freq: Four times a day (QID) | INTRAMUSCULAR | Status: DC | PRN
Start: 2016-01-25 — End: 2016-01-26

## 2016-01-25 MED ORDER — LOSARTAN POTASSIUM-HCTZ 50-12.5 MG PO TABS: 1.0000 | ORAL_TABLET | Freq: Every day | ORAL | Status: DC

## 2016-01-25 MED ORDER — OXYCODONE HCL 5 MG PO TABS: 5.0000 mg | ORAL_TABLET | Freq: Once | ORAL | Status: DC | PRN

## 2016-01-25 MED ORDER — GEMFIBROZIL 600 MG PO TABS
600.0000 mg | ORAL_TABLET | Freq: Two times a day (BID) | ORAL | Status: DC
  Administered 2016-01-25 – 2016-01-26 (×2): 600 mg via ORAL
  Filled 2016-01-25 (×5): qty 1

## 2016-01-25 MED ORDER — IBUPROFEN 400 MG PO TABS: 400.0000 mg | ORAL_TABLET | ORAL | Status: DC | PRN

## 2016-01-25 MED ORDER — OXYCODONE-ACETAMINOPHEN 5-325 MG PO TABS
1.0000 | ORAL_TABLET | ORAL | Status: DC | PRN
  Administered 2016-01-26: 2 via ORAL
  Administered 2016-01-26: 1 via ORAL
  Filled 2016-01-25: qty 1
  Filled 2016-01-25: qty 2

## 2016-01-25 MED ORDER — LOSARTAN POTASSIUM 50 MG PO TABS
50.0000 mg | ORAL_TABLET | Freq: Every day | ORAL | Status: DC
  Administered 2016-01-26: 50 mg via ORAL
  Filled 2016-01-25: qty 1

## 2016-01-25 MED ORDER — ONDANSETRON HCL 4 MG/2ML IJ SOLN
INTRAMUSCULAR | Status: DC | PRN
  Administered 2016-01-25 (×2): 4 mg via INTRAVENOUS

## 2016-01-25 MED ORDER — METHYLENE BLUE 0.5 % INJ SOLN
INTRAVENOUS | Status: DC | PRN
  Administered 2016-01-25: 1 mL via SUBMUCOSAL

## 2016-01-25 MED ORDER — DIPHENHYDRAMINE HCL 50 MG/ML IJ SOLN: 12.5000 mg | Freq: Four times a day (QID) | INTRAMUSCULAR | Status: DC | PRN

## 2016-01-25 MED ORDER — PHENYLEPHRINE HCL 10 MG/ML IJ SOLN
INTRAMUSCULAR | Status: DC | PRN
  Administered 2016-01-25 (×4): 80 ug via INTRAVENOUS

## 2016-01-25 MED ORDER — SUGAMMADEX SODIUM 200 MG/2ML IV SOLN
INTRAVENOUS | Status: DC | PRN
  Administered 2016-01-25: 200 mg via INTRAVENOUS

## 2016-01-25 MED ORDER — DIPHENHYDRAMINE HCL 50 MG/ML IJ SOLN
INTRAMUSCULAR | Status: AC
  Filled 2016-01-25: qty 1

## 2016-01-25 MED ORDER — SUCCINYLCHOLINE CHLORIDE 20 MG/ML IJ SOLN
INTRAMUSCULAR | Status: AC
  Filled 2016-01-25: qty 1

## 2016-01-25 MED ORDER — PROPOFOL 10 MG/ML IV BOLUS
INTRAVENOUS | Status: DC | PRN
  Administered 2016-01-25: 160 mg via INTRAVENOUS
  Administered 2016-01-25: 20 mg via INTRAVENOUS

## 2016-01-25 MED ORDER — BUPIVACAINE-EPINEPHRINE (PF) 0.5% -1:200000 IJ SOLN
INTRAMUSCULAR | Status: AC
  Filled 2016-01-25: qty 30

## 2016-01-25 MED ORDER — STERILE WATER FOR INJECTION IJ SOLN
INTRAMUSCULAR | Status: AC
  Filled 2016-01-25: qty 10

## 2016-01-25 MED ORDER — MORPHINE SULFATE (PF) 2 MG/ML IV SOLN: 1.0000 mg | INTRAVENOUS | Status: DC | PRN

## 2016-01-25 MED ORDER — DIPHENHYDRAMINE HCL 50 MG/ML IJ SOLN
INTRAMUSCULAR | Status: DC | PRN
  Administered 2016-01-25: 12.5 mg via INTRAVENOUS

## 2016-01-25 MED ORDER — EPHEDRINE SULFATE 50 MG/ML IJ SOLN
INTRAMUSCULAR | Status: DC | PRN
  Administered 2016-01-25 (×2): 10 mg via INTRAVENOUS

## 2016-01-25 MED ORDER — DOCUSATE SODIUM 100 MG PO CAPS
100.0000 mg | ORAL_CAPSULE | Freq: Two times a day (BID) | ORAL | Status: DC
  Administered 2016-01-25 – 2016-01-26 (×2): 100 mg via ORAL
  Filled 2016-01-25 (×2): qty 1

## 2016-01-25 MED ORDER — OXYCODONE HCL 5 MG/5ML PO SOLN: 5.0000 mg | Freq: Once | ORAL | Status: DC | PRN

## 2016-01-25 MED ORDER — DEXAMETHASONE SODIUM PHOSPHATE 4 MG/ML IJ SOLN
INTRAMUSCULAR | Status: AC
  Filled 2016-01-25: qty 1

## 2016-01-25 MED ORDER — HYDRALAZINE HCL 20 MG/ML IJ SOLN: 10.0000 mg | INTRAMUSCULAR | Status: DC | PRN

## 2016-01-25 MED ORDER — MIDAZOLAM HCL 2 MG/2ML IJ SOLN
INTRAMUSCULAR | Status: AC
  Filled 2016-01-25: qty 2

## 2016-01-25 MED ORDER — FLUCONAZOLE IN SODIUM CHLORIDE 200-0.9 MG/100ML-% IV SOLN
200.0000 mg | INTRAVENOUS | Status: DC
  Administered 2016-01-25: 200 mg via INTRAVENOUS
  Filled 2016-01-25 (×2): qty 100

## 2016-01-25 MED ORDER — PROPOFOL 10 MG/ML IV BOLUS
INTRAVENOUS | Status: AC
  Filled 2016-01-25: qty 20

## 2016-01-25 MED ORDER — ACETAMINOPHEN 325 MG PO TABS: 650.0000 mg | ORAL_TABLET | Freq: Four times a day (QID) | ORAL | Status: DC | PRN

## 2016-01-25 MED ORDER — HYDROCHLOROTHIAZIDE 12.5 MG PO CAPS
12.5000 mg | ORAL_CAPSULE | Freq: Every day | ORAL | Status: DC
  Administered 2016-01-26: 12.5 mg via ORAL
  Filled 2016-01-25: qty 1

## 2016-01-25 MED ORDER — PANTOPRAZOLE SODIUM 40 MG PO TBEC
40.0000 mg | DELAYED_RELEASE_TABLET | Freq: Every day | ORAL | Status: DC
  Administered 2016-01-25 – 2016-01-26 (×2): 40 mg via ORAL
  Filled 2016-01-25 (×2): qty 1

## 2016-01-25 MED ORDER — ARTIFICIAL TEARS OP OINT
TOPICAL_OINTMENT | OPHTHALMIC | Status: DC | PRN
  Administered 2016-01-25: 1 via OPHTHALMIC

## 2016-01-25 MED ORDER — 0.9 % SODIUM CHLORIDE (POUR BTL) OPTIME
TOPICAL | Status: DC | PRN
  Administered 2016-01-25 (×4): 1000 mL

## 2016-01-25 MED ORDER — ONDANSETRON 4 MG PO TBDP: 4.0000 mg | ORAL_TABLET | Freq: Four times a day (QID) | ORAL | Status: DC | PRN

## 2016-01-25 MED ORDER — POLYETHYLENE GLYCOL 3350 17 G PO PACK: 17.0000 g | PACK | Freq: Every day | ORAL | Status: DC | PRN

## 2016-01-25 MED ORDER — DIPHENHYDRAMINE HCL 50 MG/ML IJ SOLN: INTRAMUSCULAR | Status: DC | PRN

## 2016-01-25 MED ORDER — NYSTATIN 100000 UNIT/GM EX CREA
TOPICAL_CREAM | CUTANEOUS | Status: AC
  Administered 2016-01-25: 1 via TOPICAL
  Filled 2016-01-25: qty 15

## 2016-01-25 MED ORDER — ROCURONIUM BROMIDE 50 MG/5ML IV SOLN
INTRAVENOUS | Status: AC
  Filled 2016-01-25: qty 1

## 2016-01-25 MED ORDER — ZOLPIDEM TARTRATE 5 MG PO TABS: 5.0000 mg | ORAL_TABLET | Freq: Every evening | ORAL | Status: DC | PRN

## 2016-01-25 MED ORDER — ROCURONIUM BROMIDE 100 MG/10ML IV SOLN
INTRAVENOUS | Status: DC | PRN
  Administered 2016-01-25: 50 mg via INTRAVENOUS

## 2016-01-25 MED ORDER — LACTATED RINGERS IV SOLN
INTRAVENOUS | Status: DC | PRN
  Administered 2016-01-25 (×3): via INTRAVENOUS

## 2016-01-25 MED ORDER — TECHNETIUM TC 99M SULFUR COLLOID FILTERED
1.0000 | Freq: Once | INTRAVENOUS | Status: AC | PRN
  Administered 2016-01-25: 1 via INTRADERMAL

## 2016-01-25 MED ORDER — MIDAZOLAM HCL 5 MG/5ML IJ SOLN
INTRAMUSCULAR | Status: DC | PRN
  Administered 2016-01-25 (×2): 1 mg via INTRAVENOUS

## 2016-01-25 MED ORDER — FENTANYL CITRATE (PF) 100 MCG/2ML IJ SOLN
INTRAMUSCULAR | Status: DC | PRN
  Administered 2016-01-25: 50 ug via INTRAVENOUS
  Administered 2016-01-25: 100 ug via INTRAVENOUS
  Administered 2016-01-25 (×4): 50 ug via INTRAVENOUS

## 2016-01-25 MED ORDER — CEFAZOLIN SODIUM-DEXTROSE 2-3 GM-% IV SOLR
2.0000 g | Freq: Three times a day (TID) | INTRAVENOUS | Status: AC
  Administered 2016-01-25: 2 g via INTRAVENOUS
  Filled 2016-01-25: qty 50

## 2016-01-25 MED ORDER — ONDANSETRON HCL 4 MG/2ML IJ SOLN: 4.0000 mg | Freq: Once | INTRAMUSCULAR | Status: DC | PRN

## 2016-01-25 MED ORDER — DEXAMETHASONE SODIUM PHOSPHATE 4 MG/ML IJ SOLN
INTRAMUSCULAR | Status: DC | PRN
  Administered 2016-01-25: 4 mg via INTRAVENOUS

## 2016-01-25 MED ORDER — KCL IN DEXTROSE-NACL 20-5-0.45 MEQ/L-%-% IV SOLN
INTRAVENOUS | Status: DC
  Administered 2016-01-25 – 2016-01-26 (×2): via INTRAVENOUS
  Filled 2016-01-25 (×2): qty 1000

## 2016-01-25 MED ORDER — FENTANYL CITRATE (PF) 100 MCG/2ML IJ SOLN
25.0000 ug | INTRAMUSCULAR | Status: DC | PRN
  Administered 2016-01-25: 25 ug via INTRAVENOUS

## 2016-01-25 MED ORDER — LIDOCAINE HCL (CARDIAC) 20 MG/ML IV SOLN
INTRAVENOUS | Status: DC | PRN
  Administered 2016-01-25: 20 mg via INTRAVENOUS

## 2016-01-25 SURGICAL SUPPLY — 77 items
ATCH SMKEVC FLXB CAUT HNDSWH (FILTER) ×2 IMPLANT
BINDER BREAST LRG (GAUZE/BANDAGES/DRESSINGS) IMPLANT
BINDER BREAST XLRG (GAUZE/BANDAGES/DRESSINGS) IMPLANT
BNDG COHESIVE 4X5 TAN STRL (GAUZE/BANDAGES/DRESSINGS) IMPLANT
CANISTER SUCTION 2500CC (MISCELLANEOUS) ×3 IMPLANT
CHLORAPREP W/TINT 26ML (MISCELLANEOUS) ×6 IMPLANT
CLIP TI LARGE 6 (CLIP) ×3 IMPLANT
CLIP TI MEDIUM 6 (CLIP) ×9 IMPLANT
CLIP TI WIDE RED SMALL 6 (CLIP) ×3 IMPLANT
CLOSURE STERI-STRIP 1/2X4 (GAUZE/BANDAGES/DRESSINGS) ×2
CLOSURE WOUND 1/2 X4 (GAUZE/BANDAGES/DRESSINGS) ×1
CLSR STERI-STRIP ANTIMIC 1/2X4 (GAUZE/BANDAGES/DRESSINGS) ×4 IMPLANT
CONT SPEC 4OZ CLIKSEAL STRL BL (MISCELLANEOUS) ×12 IMPLANT
COVER SURGICAL LIGHT HANDLE (MISCELLANEOUS) ×3 IMPLANT
COVER TRANSDUCER ULTRASND GEL (DRAPE) ×3 IMPLANT
DERMABOND ADVANCED (GAUZE/BANDAGES/DRESSINGS) ×8
DERMABOND ADVANCED .7 DNX12 (GAUZE/BANDAGES/DRESSINGS) ×4 IMPLANT
DRAIN CHANNEL 19F RND (DRAIN) ×9 IMPLANT
DRAPE PROXIMA HALF (DRAPES) ×6 IMPLANT
DRAPE UTILITY XL STRL (DRAPES) IMPLANT
ELECT BLADE 4.0 EZ CLEAN MEGAD (MISCELLANEOUS) ×3
ELECT CAUTERY BLADE 6.4 (BLADE) ×3 IMPLANT
ELECT REM PT RETURN 9FT ADLT (ELECTROSURGICAL) ×3
ELECTRODE BLDE 4.0 EZ CLN MEGD (MISCELLANEOUS) ×1 IMPLANT
ELECTRODE REM PT RTRN 9FT ADLT (ELECTROSURGICAL) ×1 IMPLANT
EVACUATOR SILICONE 100CC (DRAIN) ×9 IMPLANT
EVACUATOR SMOKE ACCUVAC VALLEY (FILTER) ×4
GLOVE BIO SURGEON STRL SZ 6 (GLOVE) ×3 IMPLANT
GLOVE BIO SURGEON STRL SZ7 (GLOVE) ×6 IMPLANT
GLOVE BIOGEL PI IND STRL 6.5 (GLOVE) ×1 IMPLANT
GLOVE BIOGEL PI IND STRL 7.0 (GLOVE) ×3 IMPLANT
GLOVE BIOGEL PI IND STRL 7.5 (GLOVE) ×1 IMPLANT
GLOVE BIOGEL PI IND STRL 8.5 (GLOVE) ×1 IMPLANT
GLOVE BIOGEL PI INDICATOR 6.5 (GLOVE) ×2
GLOVE BIOGEL PI INDICATOR 7.0 (GLOVE) ×6
GLOVE BIOGEL PI INDICATOR 7.5 (GLOVE) ×2
GLOVE BIOGEL PI INDICATOR 8.5 (GLOVE) ×2
GLOVE ECLIPSE 7.5 STRL STRAW (GLOVE) ×3 IMPLANT
GLOVE SURG SS PI 8.0 STRL IVOR (GLOVE) ×3 IMPLANT
GOWN STRL REUS W/ TWL LRG LVL3 (GOWN DISPOSABLE) ×4 IMPLANT
GOWN STRL REUS W/TWL 2XL LVL3 (GOWN DISPOSABLE) ×3 IMPLANT
GOWN STRL REUS W/TWL LRG LVL3 (GOWN DISPOSABLE) ×8
GOWN STRL REUS W/TWL XL LVL3 (GOWN DISPOSABLE) ×3 IMPLANT
ILLUMINATOR WAVEGUIDE N/F (MISCELLANEOUS) IMPLANT
KIT BASIN OR (CUSTOM PROCEDURE TRAY) ×3 IMPLANT
KIT ROOM TURNOVER OR (KITS) ×3 IMPLANT
LIGHT WAVEGUIDE WIDE FLAT (MISCELLANEOUS) ×3 IMPLANT
LIQUID BAND (GAUZE/BANDAGES/DRESSINGS) ×6 IMPLANT
MARKER SKIN DUAL TIP RULER LAB (MISCELLANEOUS) ×3 IMPLANT
NEEDLE 18GX1X1/2 (RX/OR ONLY) (NEEDLE) ×3 IMPLANT
NEEDLE HYPO 25GX1X1/2 BEV (NEEDLE) ×3 IMPLANT
NEEDLE SPNL 22GX3.5 QUINCKE BK (NEEDLE) ×3 IMPLANT
NS IRRIG 1000ML POUR BTL (IV SOLUTION) ×9 IMPLANT
PACK GENERAL/GYN (CUSTOM PROCEDURE TRAY) ×3 IMPLANT
PACK UNIVERSAL I (CUSTOM PROCEDURE TRAY) ×3 IMPLANT
PAD ABD 8X10 STRL (GAUZE/BANDAGES/DRESSINGS) ×6 IMPLANT
PAD ARMBOARD 7.5X6 YLW CONV (MISCELLANEOUS) ×3 IMPLANT
PIN SAFETY STERILE (MISCELLANEOUS) ×6 IMPLANT
PREFILTER EVAC NS 1 1/3-3/8IN (MISCELLANEOUS) ×3 IMPLANT
SPECIMEN JAR X LARGE (MISCELLANEOUS) ×6 IMPLANT
SPONGE GAUZE 4X4 12PLY STER LF (GAUZE/BANDAGES/DRESSINGS) ×6 IMPLANT
SPONGE LAP 18X18 X RAY DECT (DISPOSABLE) ×6 IMPLANT
STAPLER VISISTAT 35W (STAPLE) ×3 IMPLANT
STOCKINETTE IMPERVIOUS 9X36 MD (GAUZE/BANDAGES/DRESSINGS) IMPLANT
STRIP CLOSURE SKIN 1/2X4 (GAUZE/BANDAGES/DRESSINGS) ×2 IMPLANT
SUT ETHILON 2 0 FS 18 (SUTURE) ×9 IMPLANT
SUT MON AB 4-0 PC3 18 (SUTURE) ×6 IMPLANT
SUT SILK 2 0 (SUTURE) ×2
SUT SILK 2 0 FS (SUTURE) ×3 IMPLANT
SUT SILK 2-0 18XBRD TIE 12 (SUTURE) ×1 IMPLANT
SUT VIC AB 3-0 SH 8-18 (SUTURE) ×6 IMPLANT
SYR 50ML LL SCALE MARK (SYRINGE) ×3 IMPLANT
SYR CONTROL 10ML LL (SYRINGE) ×3 IMPLANT
TOWEL OR 17X24 6PK STRL BLUE (TOWEL DISPOSABLE) IMPLANT
TOWEL OR 17X26 10 PK STRL BLUE (TOWEL DISPOSABLE) ×3 IMPLANT
TUBE CONNECTING 12'X1/4 (SUCTIONS) ×2
TUBE CONNECTING 12X1/4 (SUCTIONS) ×4 IMPLANT

## 2016-01-25 NOTE — Discharge Instructions (Signed)
[facility] °[phone] ° °MASTECTOMY: POST OP INSTRUCTIONS ° °Always review your discharge instruction sheet given to you by the facility where your surgery was performed. °IF YOU HAVE DISABILITY OR FAMILY LEAVE FORMS, YOU MUST BRING THEM TO THE OFFICE FOR PROCESSING.   °DO NOT GIVE THEM TO YOUR DOCTOR. °A prescription for pain medication may be given to you upon discharge.  Take your pain medication as prescribed, if needed.  If narcotic pain medicine is not needed, then you may take acetaminophen (Tylenol) or ibuprofen (Advil) as needed. °1. Take your usually prescribed medications unless otherwise directed. °2. If you need a refill on your pain medication, please contact your pharmacy.  They will contact our office to request authorization.  Prescriptions will not be filled after 5pm or on week-ends. °3. You should follow a light diet the first few days after arrival home, such as soup and crackers, etc.  Resume your normal diet the day after surgery. °4. Most patients will experience some swelling and bruising on the chest and underarm.  Ice packs will help.  Swelling and bruising can take several days to resolve.  °5. It is common to experience some constipation if taking pain medication after surgery.  Increasing fluid intake and taking a stool softener (such as Colace) will usually help or prevent this problem from occurring.  A mild laxative (Milk of Magnesia or Miralax) should be taken according to package instructions if there are no bowel movements after 48 hours. °6. Unless discharge instructions indicate otherwise, leave your bandage dry and in place until your next appointment in 3-5 days.  You may take a limited sponge bath.  No tube baths or showers until the drains are removed.  You may have steri-strips (small skin tapes) in place directly over the incision.  These strips should be left on the skin for 7-10 days.  If your surgeon used skin glue on the incision, you may  shower in 24 hours.  The glue will flake off over the next 2-3 weeks.  Any sutures or staples will be removed at the office during your follow-up visit. °7. DRAINS:  If you have drains in place, it is important to keep a list of the amount of drainage produced each day in your drains.  Before leaving the hospital, you should be instructed on drain care.  Call our office if you have any questions about your drains. °8. ACTIVITIES:  You may resume regular (light) daily activities beginning the next day--such as daily self-care, walking, climbing stairs--gradually increasing activities as tolerated.  You may have sexual intercourse when it is comfortable.  Refrain from any heavy lifting or straining until approved by your doctor. °a. You may drive when you are no longer taking prescription pain medication, you can comfortably wear a seatbelt, and you can safely maneuver your car and apply brakes. °b. RETURN TO WORK:  __________________________________________________________ °9. You should see your doctor in the office for a follow-up appointment approximately 3-5 days after your surgery.  Your doctor’s nurse will typically make your follow-up appointment when she calls you with your pathology report.  Expect your pathology report 2-3 business days after your surgery.  You may call to check if you do not hear from us after three days.   °10. OTHER INSTRUCTIONS: ______________________________________________________________________________________________ ____________________________________________________________________________________________ °WHEN TO CALL YOUR DOCTOR: °1. Fever over 101.0 °2. Nausea and/or vomiting °3. Extreme swelling or bruising °4. Continued bleeding from incision. °5. Increased pain, redness, or drainage from the incision. °  The clinic staff is available to answer your questions during regular business hours.  Please don’t hesitate to call and ask to speak to one of the nurses for clinical  concerns.  If you have a medical emergency, go to the nearest emergency room or call 911.  A surgeon from [facility] is always on call at the hospital. °[address], [address], [place], [place]  [address] ? [address] [address], [place], [place]   [address] °[phone] ? [phone] ? FAX [phone] °Web site: [email or web address] °

## 2016-01-25 NOTE — Op Note (Signed)
Right Mastectomy with Sentinel Node Biopsy, left modified radical mastectomy  Indications: This patient presents with history of bilateral breast cancer with clinically positive axillary lymph node exam. The left side was pathologically positive, the right side was negative.    Pre-operative Diagnosis: right breast cancer, upper outer quadrant, cT1cN0M0; left breast cancer, upper inner quadrant, cT2N1M0  Post-operative Diagnosis: bilateral breast cancer as above.  Surgeon: Stark Klein   Anesthesia: General endotracheal anesthesia and Local anesthesia 0.25.% bupivacaine, with epinephrine  ASA Class: 3  Procedure Details  The patient was seen in the Holding Room. The risks, benefits, complications, treatment options, and expected outcomes were discussed with the patient. The possibilities of reaction to medication, pulmonary aspiration, bleeding, infection, the need for additional procedures, failure to diagnose a condition, and creating a complication requiring transfusion or operation were discussed with the patient. The patient concurred with the proposed plan, giving informed consent.  The site of surgery properly noted/marked. The patient was taken to Operating Room # 2, identified as Kristen Howe, and the procedure verified as Right Mastectomy and Sentinel Node Biopsy and left modified radical mastectomy. A Time Out was held and the above information confirmed.  The methylene blue was injected in the subareolar location.    After induction of anesthesia, the bilateral breast and chest were prepped and draped in standard fashion.   The borders of the breast were identified and marked.  The right side was addressed firstThe incisions of the breast were drawn out to make sure incision lines were equidistant in length.    The superior incision was made with the #10 blade.  Mastectomy hooks were used to provide elevation of the skin edges, and the cautery was used to create the mastectomy flaps.   The dissection was taken to the fascia of the pectoralis major.  The penetrating vessels were clipped.  The superior flap was taken medially to the lateral sternal border, superiorly to the inferior border of the clavicle.  The inferior flap was similarly created, inferiorly to the inframammary fold and laterally to the border of the latissimus.  The breast was taken off including the pectoralis fascia and the axillary tail marked.    Using a hand-held gamma probe, axillary sentinel nodes were identified.  4 level 2 axillary sentinel nodes were removed and submitted to pathology.  The findings are below.  The lymphovascular channels were clipped with metal clips.        The wound was irrigated.  Langlois drain was placed laterally.   Hemostasis was achieved with cautery.  The wound was irrigated and closed with a 3-0 Vicryl deep dermal interrupted sutures and 4-0 Vicryl subcuticular closure in layers.  The left side was then addressed similarly, but an axillary lymph node dissection was performed instead of an ALND. This was performed with removal of the associated lymph nodes and surrounding adipose tissue. This included levels I and II. This was accomplished by exposing the axillary vein anteriorly and inferiorly to the level of the pectoralis minor and laterally over the latissimus dorsi muscle. Posteriorly, the dissection continued to the subscapularis.  Small venous tributaries, lymphatics, and vessels were clipped and ligated or cauterized and divided. The subscapularis muscle was skeletonized. The long thoracic and thoracodorsal neurovascular bundles were identified and preserved.  Two 19 Fr Blake drains were placed laterally.  The wound was irrigated and closed with a 3-0 Vicryl deep dermal interrupted and a 4-0 vicryl subcuticular closure in layers.   Sterile dressings were applied. At  the end of the operation, all sponge, instrument, and needle counts were correct.  Findings: grossly clear  surgical margins.  Right axillary SLNs were palpable for #1 and #2.  #3 was hot, cps 90.  #4 hot, cps 350.    Estimated Blood Loss:  less than 50 mL         Drains: 2 19 Blakes on the left and one on the right.                Specimens: R breast and 4 axillary sentinel nodes.  Left breast with axillary contents.           Complications:  None; patient tolerated the procedure well.         Disposition: PACU - hemodynamically stable.         Condition: stable

## 2016-01-25 NOTE — Interval H&P Note (Signed)
History and Physical Interval Note:  01/25/2016 7:31 AM  Kristen Howe  has presented today for surgery, with the diagnosis of RIGHT BREAST CANCER, LEFT BREAST CANCER METASTASIZED TO LYMPH NODE  The various methods of treatment have been discussed with the patient and family. After consideration of risks, benefits and other options for treatment, the patient has consented to  Procedure(s): RIGHT MASTECTOMY WITH SENTINEL RIGHT LYMPH NODE BIOPSY, LEFT MODIFIED RADICAL MASTECTOMY (Bilateral) as a surgical intervention .  The patient's history has been reviewed, patient examined, no change in status, stable for surgery.  I have reviewed the patient's chart and labs.  Questions were answered to the patient's satisfaction.     Fotini Lemus

## 2016-01-25 NOTE — Anesthesia Preprocedure Evaluation (Addendum)
Anesthesia Evaluation  Patient identified by MRN, date of birth, ID band Patient awake    Reviewed: Allergy & Precautions, NPO status , Patient's Chart, lab work & pertinent test results  Airway Mallampati: II  TM Distance: >3 FB Neck ROM: Full    Dental   Pulmonary former smoker,    breath sounds clear to auscultation       Cardiovascular hypertension,  Rhythm:Regular Rate:Normal     Neuro/Psych    GI/Hepatic   Endo/Other    Renal/GU      Musculoskeletal   Abdominal   Peds  Hematology   Anesthesia Other Findings   Reproductive/Obstetrics                            Anesthesia Physical Anesthesia Plan  ASA: III  Anesthesia Plan: General   Post-op Pain Management: GA combined w/ Regional for post-op pain   Induction: Intravenous  Airway Management Planned: LMA  Additional Equipment:   Intra-op Plan:   Post-operative Plan:   Informed Consent: I have reviewed the patients History and Physical, chart, labs and discussed the procedure including the risks, benefits and alternatives for the proposed anesthesia with the patient or authorized representative who has indicated his/her understanding and acceptance.     Plan Discussed with:   Anesthesia Plan Comments:         Anesthesia Quick Evaluation

## 2016-01-25 NOTE — Anesthesia Postprocedure Evaluation (Addendum)
Anesthesia Post Note  Patient: Kristen Howe  Procedure(s) Performed: Procedure(s) (LRB): RIGHT MASTECTOMY WITH SENTINEL RIGHT LYMPH NODE BIOPSY, LEFT MODIFIED RADICAL MASTECTOMY (Bilateral)  Patient location during evaluation: PACU Anesthesia Type: General Level of consciousness: awake and awake and alert Pain management: pain level controlled Vital Signs Assessment: post-procedure vital signs reviewed and stable Respiratory status: spontaneous breathing, nonlabored ventilation and respiratory function stable Cardiovascular status: blood pressure returned to baseline Anesthetic complications: no    Last Vitals:  Filed Vitals:   01/25/16 1430 01/25/16 1517  BP:  149/64  Pulse: 85 79  Temp:  36.6 C  Resp: 17 17    Last Pain:  Filed Vitals:   01/25/16 1554  PainSc: 2                  Bastion Bolger COKER

## 2016-01-25 NOTE — Anesthesia Postprocedure Evaluation (Signed)
Anesthesia Post Note  Patient: Kristen Howe  Procedure(s) Performed: Procedure(s) (LRB): RIGHT MASTECTOMY WITH SENTINEL RIGHT LYMPH NODE BIOPSY, LEFT MODIFIED RADICAL MASTECTOMY (Bilateral)  Patient location during evaluation: PACU Anesthesia Type: General Level of consciousness: awake and awake and alert Pain management: pain level controlled Vital Signs Assessment: post-procedure vital signs reviewed and stable Respiratory status: spontaneous breathing and nonlabored ventilation Anesthetic complications: no    Last Vitals:  Filed Vitals:   01/25/16 1430 01/25/16 1517  BP:  149/64  Pulse: 85 79  Temp:  36.6 C  Resp: 17 17    Last Pain:  Filed Vitals:   01/25/16 1554  PainSc: 2                  Edelmiro Innocent COKER

## 2016-01-25 NOTE — Transfer of Care (Signed)
Immediate Anesthesia Transfer of Care Note  Patient: Kristen Howe  Procedure(s) Performed: Procedure(s): RIGHT MASTECTOMY WITH SENTINEL RIGHT LYMPH NODE BIOPSY, LEFT MODIFIED RADICAL MASTECTOMY (Bilateral)  Patient Location: PACU  Anesthesia Type:General  Level of Consciousness: awake, alert  and oriented  Airway & Oxygen Therapy: Patient connected to face mask oxygen  Post-op Assessment: Post -op Vital signs reviewed and stable  Post vital signs: stable  Last Vitals:  Filed Vitals:   01/25/16 0617  BP: 170/71  Pulse: 69  Temp: 37.2 C  Resp: 20    Complications: No apparent anesthesia complications

## 2016-01-25 NOTE — H&P (Signed)
Kristen Howe Location: [facility] Surgery Patient #: [id number] DOB: [birth year] Divorced / Language: [name] / Race: White Female   History of Present Illness  The patient is a 70 year old female who presents with breast cancer. Patient is a 70 year old female who presents with a new diagnosis of bilateral breast cancer. She had a palpable mass on the right. She had not had a mammogram for around 5 or 6 years. When she underwent mammography and ultrasound, she had multiple findings. She had a 1.8 cm mass on the right at 9:00 that was biopsied, a 7 mm nodule that was 3.7 cm away that was not biopsied, and a concerning lymph node that was too close to the axillary vessels for biopsy. The breast biopsy on that side was grade 2 invasive ductal carcinoma that was ER and PR positive, HER-2 negative, and Ki-67 of 20%. On the left side, she was also found to have significant abnormality. There was an abnormal mass that was 6.7 cm in greatest dimension. This extended to a dilated duct in the subareolar location. Both of these areas were biopsied as well as an abnormal lymph node. These were all positive on the left. This was a dissimilar appearing invasive ductal carcinoma, grade 2-3, ER/PR positive, her 2 negative, and Ki-67 of 40%.    She has a twin sister who had bilateral breast cancer as well as a father with lymphoma.  She is a retired Nurse, adult. She is a current smoker and is now to half a pack per day. She had menarche at age 47. Her last period was in 1997. She did not use hormone replacement but did use hormone contraception for 3 years. She carried 2 children to term with her first birth at age 73. She has not yet had a colonoscopy. She has had a bone density study in 2002 or 3. She had a Pap smear 6 years ago.  CBC, CMET essentially normal except for a slightly decreased GFR.   Pathology Diagnosis 1. Breast, right, needle core biopsy, 9:30 o'clock - INVASIVE DUCTAL CARCINOMA,  GRADE 2. - SEE MICROSCOPIC DESCRIPTION 2. Breast, left, needle core biopsy, 11:30 o'clock - INVASIVE DUCTAL CARCINOMA, GRADE 2. - SEE MICROSCOPIC DESCRIPTION 3. Lymph node, needle/core biopsy, left axilla - CARCINOMA CONSISTENT WITH METASTATIC CARCINOMA. - SEE MICROSCOPIC DESCRIPTION 4. Breast, left, needle core biopsy, retroareolar - DUCTAL CARCINOMA. - SEE MICROSCOPIC DESCRIPTION  mammogram/ultrasound FINDINGS: There is a spiculated mass in the right breast slightly upper outer quadrant, middle depth, which corresponds to the area of palpable concern. A second sub cm circumscribed nodule is seen in the right breast upper outer quadrant, anterior depth.  In the left breast there is a multifocal macro lobulated mass centered at 12 o'clock, middle depth. Interspersed suspicious calcifications are seen. The process measures mammographically 3.4 x 4.9 x 6.7 cm. A dilated duct extends to the nipple and contains microcalcifications at the level of the nipple. There is also a nodule in the left breast 1 o'clock, middle to posterior depth.  Mammographic images were processed with CAD.  On physical exam, there is a superficially located firm fixed mass in the right 9 o'clock breast, which corresponds to the area of palpable concern. An area of thickening is felt in the left 12 o'clock breast, middle depth.  Targeted right breast ultrasound is performed, showing 9 o'clock 10 cm from the nipple hypoechoic spiculated mass with prominent hyperechoic halo which is taller than wide and abuts the skin surface. It measures  1.7 by 1.4 by 1.8 cm. There is increased internal vascularity. There is a separate hypoechoic slightly irregular nodule in the right breast 11 o'clock 1 cm from the nipple which measures 0.5 x 0.6 x 0.7 cm. Evaluation of the right axilla demonstrates a grossly abnormal lymph node in the deep right axilla which measures 1.2 by 1.4 by 1.2 cm, which demonstrates  complete effacement of its hilum. It is located in close proximity to the axillary vessels, and is likely not amenable to ultrasound-guided core needle biopsy. Several other mildly abnormal lymph nodes with cortical thickening of up to 0.4 cm are seen in the lower right axilla.  Targeted left breast ultrasound demonstrates a multifocal fragmented mass centered in the left 12 o'clock breast. The main component of the mass is a hypoechoic irregular taller than wide mass which measures 1.3 by 1.1 by 2.1 cm located at 12 o'clock 7 cm from the nipple. Slightly superior to it at 12 o'clock 9 cm from the nipple, there is a second discrete mass measuring 1.6 by 0.7 by 1.9 cm. Other suspicious satellite nodules are seen in the adjacent breast parenchyma. There is a separate microcalcifications containing suspicious nodule in the left breast 1 o'clock 10 cm from the nipple measuring 0.7 by 0.8 by 0.7 cm. There is a dilated duct extending from the 12 o'clock process to the nipple containing microcalcifications. Evaluation of the left axilla demonstrates a grossly abnormal lymph node with cortical thickness of up to 1 cm and abnormal cortical vascularity in the lower left axilla. A second indeterminate lymph node is seen in the axilla as well.  IMPRESSION: Right breast 9 o'clock suspicious mass with right axillary lymphadenopathy.  Right breast 11 o'clock 7 mm indeterminate nodule.  Left breast 12 o'clock multifocal suspicious mass with ductal ectasia to the level of the nipple, likely involved by disease, and associated left axillary lymphadenopathy.  Left breast 1 o'clock 8 mm suspicious nodule.     Medication History  Medications Reconciled    Review of Systems All other systems negative  Vitals Weight: 160.1 lb Height: 60in Body Surface Area: 1.7 m Body Mass Index: 31.27 kg/m  Temp.: 98.66F  Pulse: 86 (Regular)  Resp.: 18 (Unlabored)  BP: 144/76 (Sitting,  Left Arm, Standard)       Physical Exam  General Mental Status-Alert. General Appearance-Consistent with stated age. Hydration-Well hydrated. Voice-Normal.  Head and Neck Head-normocephalic, atraumatic with no lesions or palpable masses. Trachea-midline. Thyroid Gland Characteristics - normal size and consistency.  Eye Eyeball - Bilateral-Extraocular movements intact. Sclera/Conjunctiva - Bilateral-No scleral icterus.  Chest and Lung Exam Chest and lung exam reveals -quiet, even and easy respiratory effort with no use of accessory muscles and on auscultation, normal breath sounds, no adventitious sounds and normal vocal resonance. Inspection Chest Wall - Normal. Back - normal.  Breast Note: Palpable breast mass at 9:00 that's approximately 2 cm. This is mobile. I am concerned that I can palpate a lymph node on the right. There is no nipple retraction, nipple discharge, or skin dimpling. She does have some bruising at the site of biopsy. The left breast has a Howe amorphous mass in the approximately 11-2 o'clock region near the nipple. This fills again Howe amorphous. There is palpable lymphadenopathy on that side.   Cardiovascular Cardiovascular examination reveals -normal heart sounds, regular rate and rhythm with no murmurs and normal pedal pulses bilaterally.  Abdomen Inspection Inspection of the abdomen reveals - No Hernias. Palpation/Percussion Palpation and Percussion of the abdomen  reveal - Soft, Non Tender, No Rebound tenderness, No Rigidity (guarding) and No hepatosplenomegaly. Auscultation Auscultation of the abdomen reveals - Bowel sounds normal.  Neurologic Neurologic evaluation reveals -alert and oriented x 3 with no impairment of recent or remote memory. Mental Status-Normal.  Musculoskeletal Global Assessment -Note: no gross deformities.  Normal Exam - Left-Upper Extremity Strength Normal and Lower Extremity Strength  Normal. Normal Exam - Right-Upper Extremity Strength Normal and Lower Extremity Strength Normal.  Lymphatic Head & Neck  General Head & Neck Lymphatics: Bilateral - Description - Normal. Axillary  General Axillary Region: Bilateral - Description - Normal. Tenderness - Non Tender. Femoral & Inguinal  Generalized Femoral & Inguinal Lymphatics: Bilateral - Description - No Generalized lymphadenopathy.    Assessment & Plan  PRIMARY CANCER OF UPPER OUTER QUADRANT OF RIGHT FEMALE BREAST (C50.411) Impression: The patient has a new right breast cancer that is a clinical T2 N0-1 stage. This is highly suspicious to be positive nodal spread, however she was unable to have this biopsied. We discussed the pros and cons of pursuing an MRI and additional biopsies on the right side, however she wishes to have a mastectomy on that side for symmetry. We will plan to do a sentinel lymph node biopsy because of the fact that we do not have a positive result and that lymph node. The side could be sent for Oncotype if the lymph node basin is negative.  The left side is definitely too large with a prognostic profile but is difficult to achieve significant reduction. She will undergo a left modified radical mastectomy. This I will be sent for mammogram to see if she would benefit from chemotherapy. She will definitely need radiation on the left side based on the positive lymph node and the size of the tumor. The right side may also need radiation if it is positive for lymphatic spread or if the tumor is greater than 5 cm. We will discuss the possibility of genetics with the genetics counselor. She has bilateral breast cancer but is over the age of 12, but has one sister with bilateral breast cancer and a father with lymphoma.  The patient will also undergo staging. Barring dramatic findings, she would still proceed with surgery.  I reviewed the risks of surgery including bleeding, infection, possible need for  additional surgery, possible cancer recurrence, possible heart or lung complications, possible blood clot, possible dissatisfaction with her scars. The patient understands and wishes to proceed. I reviewed that she would have drains and that she would need to stay overnight in the hospital. Her sister knows how to care for drains and is able to assist with this.  60 min spent in evaluation, examination, counseling, and coordination of care. >50% spent in counseling. Current Plans You are being scheduled for surgery - Our schedulers will call you.  You should hear from our office's scheduling department within 5 working days about the location, date, and time of surgery. We try to make accommodations for patient's preferences in scheduling surgery, but sometimes the OR schedule or the surgeon's schedule prevents [place] from making those accommodations.  If you have not heard from our office [phone]) in 5 working days, call the office and ask for your surgeon's nurse.  If you have other questions about your diagnosis, plan, or surgery, call the office and ask for your surgeon's nurse.  Pt Education - [facility] Breast Cancer Information Given - [facility] "Breast Journey" Package Pt Education - [facility]b breast cancer surgery: discussed with patient and  provided information. PRIMARY CANCER OF UPPER INNER QUADRANT OF LEFT FEMALE BREAST (H68.088)    Signed by Stark Klein, MD

## 2016-01-25 NOTE — Anesthesia Procedure Notes (Addendum)
Procedure Name: Intubation Date/Time: 01/25/2016 7:57 AM Performed by: Ollen Bowl Pre-anesthesia Checklist: Patient identified, Emergency Drugs available, Suction available, Patient being monitored and Timeout performed Patient Re-evaluated:Patient Re-evaluated prior to inductionOxygen Delivery Method: Circle system utilized and Simple face mask Preoxygenation: Pre-oxygenation with 100% oxygen Intubation Type: IV induction Ventilation: Mask ventilation with difficulty and Oral airway inserted - appropriate to patient size Laryngoscope Size: Sabra Heck and 2 Grade View: Grade I Tube type: Oral Tube size: 7.5 mm Number of attempts: 1 Airway Equipment and Method: Patient positioned with wedge pillow and Stylet Placement Confirmation: ETT inserted through vocal cords under direct vision,  positive ETCO2 and breath sounds checked- equal and bilateral Secured at: 22 cm Tube secured with: Tape Dental Injury: Teeth and Oropharynx as per pre-operative assessment     Anesthesia Regional Block:  Pectoralis block  Pre-Anesthetic Checklist: ,, timeout performed, Correct Patient, Correct Site, Correct Laterality, Correct Procedure, Correct Position, site marked, Risks and benefits discussed,  Surgical consent,  Pre-op evaluation,  At surgeon's request and post-op pain management  Laterality: Left  Prep: chloraprep       Needles:  Injection technique: Single-shot  Needle Type: Echogenic Stimulator Needle     Needle Length: 9cm 9 cm Needle Gauge: 21 and 21 G    Additional Needles:  Procedures: ultrasound guided (picture in chart) Pectoralis block Narrative:  Start time: 01/25/2016 7:10 AM End time: 01/25/2016 7:20 AM Injection made incrementally with aspirations every 5 mL.  Performed by: Personally   Additional Notes: 25 cc 0.5% Bupivacaine with 1:200 epi injected easily   Anesthesia Regional Block:  Pectoralis block  Pre-Anesthetic Checklist: ,, timeout performed, Correct  Patient, Correct Site, Correct Laterality, Correct Procedure, Correct Position, site marked, Risks and benefits discussed,  Surgical consent,  Pre-op evaluation,  At surgeon's request and post-op pain management  Laterality: Right  Prep: chloraprep       Needles:   Needle Type: Echogenic Stimulator Needle     Needle Length: 9cm 9 cm Needle Gauge: 21 and 21 G    Additional Needles:  Procedures: ultrasound guided (picture in chart) Pectoralis block Narrative:  Start time: 01/25/2016 8:30 AM End time: 01/25/2016 8:35 AM Injection made incrementally with aspirations every 5 mL.  Performed by: Personally   Additional Notes: Procedure done in OR after induction, 20 cc 0.5% bupivacaine with 1:200 epi injected easily

## 2016-01-26 ENCOUNTER — Encounter (HOSPITAL_COMMUNITY): Payer: Self-pay | Admitting: General Surgery

## 2016-01-26 ENCOUNTER — Other Ambulatory Visit: Payer: Self-pay | Admitting: Family

## 2016-01-26 DIAGNOSIS — C773 Secondary and unspecified malignant neoplasm of axilla and upper limb lymph nodes: Secondary | ICD-10-CM | POA: Diagnosis not present

## 2016-01-26 DIAGNOSIS — Z803 Family history of malignant neoplasm of breast: Secondary | ICD-10-CM | POA: Diagnosis not present

## 2016-01-26 DIAGNOSIS — Z170 Estrogen receptor positive status [ER+]: Secondary | ICD-10-CM | POA: Diagnosis not present

## 2016-01-26 DIAGNOSIS — F17210 Nicotine dependence, cigarettes, uncomplicated: Secondary | ICD-10-CM | POA: Diagnosis not present

## 2016-01-26 DIAGNOSIS — C50411 Malignant neoplasm of upper-outer quadrant of right female breast: Secondary | ICD-10-CM | POA: Diagnosis not present

## 2016-01-26 DIAGNOSIS — C50212 Malignant neoplasm of upper-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 breast: Secondary | ICD-10-CM | POA: Diagnosis not present

## 2016-01-26 LAB — BASIC METABOLIC PANEL
ANION GAP: 4 — AB (ref 5–15)
BUN: 8 mg/dL (ref 6–20)
CHLORIDE: 112 mmol/L — AB (ref 101–111)
CO2: 25 mmol/L (ref 22–32)
Calcium: 8.4 mg/dL — ABNORMAL LOW (ref 8.9–10.3)
Creatinine, Ser: 0.77 mg/dL (ref 0.44–1.00)
Glucose, Bld: 137 mg/dL — ABNORMAL HIGH (ref 65–99)
POTASSIUM: 4.5 mmol/L (ref 3.5–5.1)
SODIUM: 141 mmol/L (ref 135–145)

## 2016-01-26 LAB — CBC
HCT: 31.5 % — ABNORMAL LOW (ref 36.0–46.0)
HEMOGLOBIN: 10 g/dL — AB (ref 12.0–15.0)
MCH: 27.9 pg (ref 26.0–34.0)
MCHC: 31.7 g/dL (ref 30.0–36.0)
MCV: 87.7 fL (ref 78.0–100.0)
PLATELETS: 267 10*3/uL (ref 150–400)
RBC: 3.59 MIL/uL — AB (ref 3.87–5.11)
RDW: 14 % (ref 11.5–15.5)
WBC: 7.8 10*3/uL (ref 4.0–10.5)

## 2016-01-26 MED ORDER — TRAMADOL HCL 50 MG PO TABS: 50.0000 mg | ORAL_TABLET | Freq: Four times a day (QID) | ORAL | Status: DC | PRN

## 2016-01-26 MED ORDER — OXYCODONE-ACETAMINOPHEN 5-325 MG PO TABS: 1.0000 | ORAL_TABLET | ORAL | Status: DC | PRN

## 2016-01-26 MED ORDER — NYSTATIN 100000 UNIT/GM EX POWD: CUTANEOUS | Status: DC

## 2016-01-26 NOTE — Discharge Summary (Signed)
Physician Discharge Summary  Patient ID: Kristen Howe MRN: [id number] DOB/AGE: Oct 28, 1946 70 y.o.  Admit date: 01/25/2016 Discharge date: 01/26/2016  Admission Diagnoses: Patient Active Problem List   Diagnosis Date Noted  . Bilateral breast cancer ([place]) 01/25/2016  . Tobacco abuse 12/22/2015  . Primary cancer of 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 breast ([place]) 12/14/2015  . Breast cancer of upper-outer quadrant of right female breast ([place]) 12/14/2015  . Lump in female breast 11/20/2015  . Umbilical hernia without obstruction and without gangrene 07/28/2015  . Essential hypertension 07/28/2015  . Seborrheic keratosis 07/28/2015    Discharge Diagnoses:  Active Problems:   Bilateral breast cancer [facility])  Patient Active Problem List   Diagnosis Date Noted  . Bilateral breast cancer ([place]) 01/25/2016  . Tobacco abuse 12/22/2015  . Primary cancer of 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 breast ([place]) 12/14/2015  . Breast cancer of upper-outer quadrant of right female breast ([place]) 12/14/2015  . Lump in female breast 11/20/2015  . Umbilical hernia without obstruction and without gangrene 07/28/2015  . Essential hypertension 07/28/2015  . Seborrheic keratosis 07/28/2015    Discharged Condition: stable  Hospital Course:  Pt was admitted to the floor following bilateral mastectomies, with right sentinel lymph node biopsy and left axillary lymph node dissection.  She did well overnight.  She had good pain control with oral narcotics.  Her family demonstrated proficiency with drain care.  She was able to void and ambulate.  She was discharged to home in stable condition.    Consults: None  Significant Diagnostic Studies: HCT with minimal drop  Treatments: surgery: see above.   Discharge Exam: Blood pressure 139/76, pulse 71, temperature 97.6 F (36.4 C), temperature source Oral, resp. rate 17, height 5' (1.524 m), weight 74.39 kg (164 lb), SpO2 95 %. General appearance: alert,  cooperative and no distress Chest wall: bilateral anticipated chest wall tenderness.   GI: s, NT, ND.  Disposition: 01-Home or Self Care      Discharge Instructions    Call MD for:  persistant nausea and vomiting    Complete by:  As directed      Call MD for:  redness, tenderness, or signs of infection (pain, swelling, redness, odor or green/yellow discharge around incision site)    Complete by:  As directed      Call MD for:  severe uncontrolled pain    Complete by:  As directed      Call MD for:  temperature >100.4    Complete by:  As directed      Diet - low sodium heart healthy    Complete by:  As directed      Discharge wound care:    Complete by:  As directed   Measure and record drain output.  Bring record to clinic.     Increase activity slowly    Complete by:  As directed             Medication List    TAKE these medications        CITRACAL +D3 PO  Take 2 tablets by mouth 2 (two) times daily.     cyclobenzaprine 10 MG tablet  Commonly known as:  FLEXERIL  Take 1 tablet (10 mg total) by mouth 3 (three) times daily as needed for muscle spasms.     DRY EYES OP  Apply 1 drop to eye daily as needed (for dry eyes).     gemfibrozil 600 MG tablet  Commonly known as:  LOPID  Take 1 tablet (600 mg total) by mouth 2 (two) times daily before a meal.     ibuprofen 200 MG tablet  Commonly known as:  ADVIL,MOTRIN  Take 400 mg by mouth 2 (two) times daily as needed for moderate pain.     losartan-hydrochlorothiazide 50-12.5 MG tablet  Commonly known as:  HYZAAR  Take 1 tablet by mouth daily.     multivitamin with minerals tablet  Take 1 tablet by mouth daily. Reported on 12/20/2015     nystatin 100000 UNIT/GM Powd  Apply to lower chest wall flap on both sides daily.     oxyCODONE-acetaminophen 5-325 MG tablet  Commonly known as:  ROXICET  Take 1-2 tablets by mouth every 4 (four) hours as needed.     pantoprazole 40 MG tablet  Commonly known as:  PROTONIX   Take 1 tablet (40 mg total) by mouth daily.     traMADol 50 MG tablet  Commonly known as:  ULTRAM  Take 1-2 tablets (50-100 mg total) by mouth every 6 (six) hours as needed for moderate pain or severe pain.       Follow-up Information    Follow up with [facility], MD In 2 weeks.   Specialty:  General Surgery   Contact information:   [address] [place] [address] [phone]       Signed: Stark Klein 01/26/2016, 8:58 AM

## 2016-01-26 NOTE — Progress Notes (Signed)
Patient is discharged from room 6N14 at this time. Alert and in stable condition. IV site d/c'd. Instructions read to patient and sister and understanding verbalized. Left unit via wheelchair with all belongings at side.

## 2016-01-29 NOTE — Telephone Encounter (Signed)
Last refill was 07/28/15

## 2016-02-01 ENCOUNTER — Encounter: Payer: Self-pay | Admitting: *Deleted

## 2016-02-02 ENCOUNTER — Telehealth: Payer: Self-pay | Admitting: Oncology

## 2016-02-02 ENCOUNTER — Other Ambulatory Visit (HOSPITAL_BASED_OUTPATIENT_CLINIC_OR_DEPARTMENT_OTHER): Payer: Medicare Other

## 2016-02-02 ENCOUNTER — Encounter: Payer: Self-pay | Admitting: *Deleted

## 2016-02-02 DIAGNOSIS — C50411 Malignant neoplasm of upper-outer quadrant of right female breast: Secondary | ICD-10-CM

## 2016-02-02 DIAGNOSIS — Z720 Tobacco use: Secondary | ICD-10-CM

## 2016-02-02 DIAGNOSIS — Z13820 Encounter for screening for osteoporosis: Secondary | ICD-10-CM | POA: Insufficient documentation

## 2016-02-02 LAB — COMPREHENSIVE METABOLIC PANEL
ALBUMIN: 3.2 g/dL — AB (ref 3.5–5.0)
ALK PHOS: 92 U/L (ref 40–150)
ALT: 25 U/L (ref 0–55)
ANION GAP: 10 meq/L (ref 3–11)
AST: 24 U/L (ref 5–34)
BUN: 12.1 mg/dL (ref 7.0–26.0)
CALCIUM: 9.3 mg/dL (ref 8.4–10.4)
CO2: 23 meq/L (ref 22–29)
CREATININE: 0.8 mg/dL (ref 0.6–1.1)
Chloride: 108 mEq/L (ref 98–109)
EGFR: 77 mL/min/{1.73_m2} — AB (ref >90)
Glucose: 100 mg/dl (ref 70–140)
Potassium: 4.3 mEq/L (ref 3.5–5.1)
Sodium: 140 mEq/L (ref 136–145)
TOTAL PROTEIN: 6.7 g/dL (ref 6.4–8.3)

## 2016-02-02 LAB — CBC WITH DIFFERENTIAL/PLATELET
BASO%: 0.7 % (ref 0.0–2.0)
Basophils Absolute: 0.1 10*3/uL (ref 0.0–0.1)
EOS ABS: 0.3 10*3/uL (ref 0.0–0.5)
EOS%: 3.1 % (ref 0.0–7.0)
HEMATOCRIT: 34 % — AB (ref 34.8–46.6)
HEMOGLOBIN: 11.3 g/dL — AB (ref 11.6–15.9)
LYMPH#: 1.7 10*3/uL (ref 0.9–3.3)
LYMPH%: 19.5 % (ref 14.0–49.7)
MCH: 28.3 pg (ref 25.1–34.0)
MCHC: 33.3 g/dL (ref 31.5–36.0)
MCV: 85.2 fL (ref 79.5–101.0)
MONO#: 0.7 10*3/uL (ref 0.1–0.9)
MONO%: 8.3 % (ref 0.0–14.0)
NEUT%: 68.4 % (ref 38.4–76.8)
NEUTROS ABS: 6 10*3/uL (ref 1.5–6.5)
PLATELETS: 404 10*3/uL — AB (ref 145–400)
RBC: 3.99 10*6/uL (ref 3.70–5.45)
RDW: 13.4 % (ref 11.2–14.5)
WBC: 8.8 10*3/uL (ref 3.9–10.3)

## 2016-02-02 NOTE — Telephone Encounter (Signed)
Left message for pt to inform her of appt change to 3/10 per 2/16 pof

## 2016-02-03 LAB — CANCER ANTIGEN 27.29: CA 27.29: 18.2 U/mL (ref 0.0–38.6)

## 2016-02-03 LAB — VITAMIN D 25 HYDROXY (VIT D DEFICIENCY, FRACTURES): Vitamin D, 25-Hydroxy: 29.2 ng/mL — ABNORMAL LOW (ref 30.0–100.0)

## 2016-02-08 ENCOUNTER — Telehealth: Payer: Self-pay | Admitting: *Deleted

## 2016-02-08 NOTE — Telephone Encounter (Signed)
Received order per Dr. Jana Hakim for Mammaprint testing on bilateral tumor. Requisition sent to pathology. Received by Alyse Low.

## 2016-02-09 ENCOUNTER — Ambulatory Visit: Payer: Medicare Other | Admitting: Oncology

## 2016-02-20 ENCOUNTER — Telehealth: Payer: Self-pay | Admitting: *Deleted

## 2016-02-20 ENCOUNTER — Encounter (HOSPITAL_COMMUNITY): Payer: Self-pay

## 2016-02-20 NOTE — Telephone Encounter (Signed)
Received Mammaprint testing results of low risk on the Left breast. Testing pending on Right right.

## 2016-02-23 ENCOUNTER — Ambulatory Visit (HOSPITAL_BASED_OUTPATIENT_CLINIC_OR_DEPARTMENT_OTHER): Payer: Medicare Other | Admitting: Oncology

## 2016-02-23 ENCOUNTER — Telehealth: Payer: Self-pay | Admitting: Oncology

## 2016-02-23 ENCOUNTER — Encounter: Payer: Self-pay | Admitting: Gastroenterology

## 2016-02-23 VITALS — BP 144/70 | HR 64 | Temp 98.5°F | Resp 19 | Ht 60.0 in | Wt 154.9 lb

## 2016-02-23 DIAGNOSIS — C50112 Malignant neoplasm of central portion of left female breast: Secondary | ICD-10-CM | POA: Diagnosis not present

## 2016-02-23 DIAGNOSIS — C50411 Malignant neoplasm of upper-outer quadrant of right female breast: Secondary | ICD-10-CM | POA: Diagnosis not present

## 2016-02-23 DIAGNOSIS — C50212 Malignant neoplasm of upper-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 breast: Secondary | ICD-10-CM

## 2016-02-23 DIAGNOSIS — C50412 Malignant neoplasm of upper-outer quadrant of left female breast: Secondary | ICD-10-CM

## 2016-02-23 DIAGNOSIS — C773 Secondary and unspecified malignant neoplasm of axilla and upper limb lymph nodes: Secondary | ICD-10-CM | POA: Diagnosis not present

## 2016-02-23 NOTE — Progress Notes (Signed)
[place]  [phone] [phone]     ID: Kristen Howe DOB: [birth year]  MR#: [id number]  MOQ#:[id number]  Patient Care Team: Golden Circle, FNP as PCP - General (Family Medicine) Chauncey Cruel, MD as Consulting Physician (Oncology) Kyung Rudd, MD as Consulting Physician (Radiation Oncology) Stark Klein, MD as Consulting Physician (General Surgery) PCP: Mauricio Po, FNP GYN: OTHER MD:  CHIEF COMPLAINT: bilateral breast cancer  CURRENT TREATMENT: Awaiting adjuvant radiation   BREAST CANCER HISTORY: From the original intake note:  "Kristen Howe" herself palpated a mass in Kristen Howe right breast sometime in November 2016. She brought it to Dr. Purcell Nails attention on 11/20/2015 and he set Kristen Howe up for bilateral diagnostic mammography with tomography and I lateral breast [facility]. The breast density was category B. In the right breast upper outer quadrant there where 2 nodules, at the 9:00 position (which by ultrasound was spiculated and measured 1.7 cm) and at the 11:00 position (which by ultrasound measured 0.7 cm. The right axilla showed a grossly abnormal lymph node measuring 1.4 cm with complete effacement of the hilum.there were several other small lymph nodes with cortical thickening in the lower right axilla as well.  Biopsy of the right breast mass on 12/12/2015 showed ([id number]) and invasive ductal carcinoma, grade 2, estrogen receptor 100% positive, progesterone receptor 80% positive, both with strong staining intensity, with an MIB-1 of 20%, and Kristen Howe-2 nonamplified, with a signals ratio of 1.33 and number per cell 3.25.  In the left breast mammographically there was a multifocal mass centered at the 12:00 location with suspicious calcifications. The processes measured 6.7 cm altogether. A dilated duct extended to the nipple from this area, containing microcalcifications. Ultrasonography of the left breast  found a multifocal fragmented mass superiorly, the main component being hypoechoic and irregular and tolerable than wide, measuring 2.1 cm.superior to that, there was a second mass measuring 1.9 cm and there were other suspicious solid nodules in the adjacent parenchyma. There was also a separate area of microcalcifications containing a suspicious nodule at the 1:00 position. This measured 0.8 cm. The ultrasound confirmed a dilated duct extending from the superior process to the nipple. An alteration of the left axilla showed a grossly abnormal lymph node measuring up to 1 cm and a second indeterminant lymph node.  Biopsy of the left breast 11:30 o'clock massshowed invasive ductal carcinoma, grade 2, estrogen receptor 100% positive, progesterone receptor 70% positive, with an MIB-1 of 20%, and no Kristen Howe-2 amplification, the signals ratio being 1.59 and the number per cell 3.90. A second area in the breast described as retroareolar showed ductal carcinoma, possibly in situ. This was also estrogen receptor positive at 100%, progesterone receptor positive at 60%, with an MIB-1 of 5%, and Kristen Howe-2 negative, with a signals ratio of 1.73 and number per cell 3.55. Biopsy of the left breast lymph node was positiveand the prognostic panel there was very similar to all the others, namely estrogen receptor 100% positive, progesterone receptor 40% positive, with an MIB-1 of 40%, and Kristen Howe-2 no amplification, with a signals ratio of 1.52 and number per cell 3.85.  The patient's subsequent history is as detailed below.  INTERVAL HISTORY: Kristen Howe returns today for follow-up of Kristen Howe bilateral breast cancers. Since Kristen Howe last visit here she underwent right mastectomy with sentinel lymph node sampling, and left modified radical mastectomy, on 01/25/2016. The final pathology ([id number]) showed, on the left, an invasive ductal carcinoma measuring 6.5 cm, grade  2, with 1 out of 15 lymph nodes involved by tumor. Repeat Kristen Howe-2 was again negative,  with a signals ratio 1.48, and the number per cell being 3.72. This tumor was estrogen receptor 90% positive, progesterone receptor 70% positive, with an MIB-1 of 20%.  On the right there was an invasive ductal carcinoma measuring 2.1 cm, grade 2, with a separate focus of ductal carcinoma in situ measuring 0.5 cm. One of 4 sentinel lymph nodes was involved by tumor. The prognostic panel from the invasive disease on the right. Repeat Kristen Howe-2 was again negative with a signals ratio of 1.20 and number Purcell 3.00. The prognostic panel from this tumor biopsy previously had shown strongly estrogen and progesterone receptor positivity, with an MIB-1 of 5%.  Mammaprint on the left-sided tumor came back low risk. Mammaprint for the tumor in the right is pending.   Kristen Howe's case was also presented at the multidisciplinary breast cancer conference 02/21/2015. At that time it was felt that unless we get a surprise from the right-sided pendng Mammaprint, she could proceed directly to radiation, with no chemotherapy and no right-sided ALND planned, after which she would receive antiestrogen treatment.  REVIEW OF SYSTEMS: Kristen Howe is still pretty sore from Kristen Howe surgery. She is taking ibuprofen for the discomfort. She doesn't get any benefit from tramadol she says. She has had no bleeding or fever however. A detailed review of systems today was otherwise stable  PAST MEDICAL HISTORY: Past Medical History  Diagnosis Date  . Hypertension   . Hyperlipidemia   . GERD (gastroesophageal reflux disease)   . Leg cramps     takes Flexeril  . Anemia     as a child  . Primary cancer of 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 breast ([facility]) 12/14/2015  . Bilateral breast cancer ([facility])     Archie Endo 01/25/2016  . Rheumatic fever 1950s  . Pneumonia ~ 2005 X 1  . Migraine     "maybe monthly" (01/25/2016)  . Arthritis     "lower back" (01/25/2016)    PAST SURGICAL HISTORY: Past Surgical History  Procedure Laterality Date  . Cesarean  section  1974; 1976  . Umbilical hernia repair N/A 09/12/2015    Procedure: REPAIR OF INCISIONAL AND UMBILICAL HERNIA;  Surgeon: Erroll Luna, MD;  Location: [facility];  Service: General;  Laterality: N/A;  . Insertion of mesh N/A 09/12/2015    Procedure: INSERTION OF MESH;  Surgeon: Erroll Luna, MD;  Location: [facility];  Service: General;  Laterality: N/A;  . Mastectomy modified radical Left 01/25/2016  . Mastectomy complete / simple w/ sentinel node biopsy Right 01/25/2016  . Hernia repair    . Tonsillectomy  1950s  . Breast biopsy Bilateral 2016  . Cataract extraction w/ intraocular lens  implant, bilateral Bilateral 05/2015-06/2015  . Eye muscle surgery Bilateral 1950s    for cross eyes as a child  . Mastectomy w/ sentinel node biopsy Bilateral 01/25/2016    Procedure: RIGHT MASTECTOMY WITH SENTINEL RIGHT LYMPH NODE BIOPSY, LEFT MODIFIED RADICAL MASTECTOMY;  Surgeon: Stark Klein, MD;  Location: [facility];  Service: General;  Laterality: Bilateral;    FAMILY HISTORY Family History  Problem Relation Age of Onset  . Healthy Mother   . Lymphoma Father   . Breast cancer Sister   the patient's father died at the age of 71 from complications of diabetes. He had a history of lymphoma. The patient's mother is currently living at age 28. The patient's brother died in an automobile accident. The  patient has a twin sister, Varney Biles. She has a history of bilateral breast cancers and did undergo genetic testing in November 2012, with no BRCA1 or 2 mutation found  GYNECOLOGIC HISTORY:  No LMP recorded. Patient is postmenopausal. Menarche age 23, first live birth age 16. The patient is GX P2. She stopped having periods approximately 1997. She did not take hormone replacement. She took oral contraceptives remotely for approximately 3 years, with no complications.  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 She is divorced and lives at home with Kristen Howe sister  Joycelyn Schmid, with their mother, and with 2 nephews, Rush Landmark, who is not employed, and Corene Cornea, who is disabled. The patient has 3 grandchildren. She is not a church attender    ADVANCED DIRECTIVES: Not in place. At the initial clinic visit  the patient was given the appropriate forms to complete and notarize at Kristen Howe discretion.  HEALTH MAINTENANCE: Social History  Substance Use Topics  . Smoking status: Former Smoker -- 0.75 packs/day for 57 years    Types: Cigarettes    Quit date: 12/24/2015  . Smokeless tobacco: Never Used  . Alcohol Use: 0.0 oz/week    0 Standard drinks or equivalent per week     Comment: 01/25/2016 'might have a few drinks/year"     Colonoscopy: never  PAP: 2010  Bone density: remote  Lipid panel:  No Known Allergies  Current Outpatient Prescriptions  Medication Sig Dispense Refill  . Artificial Tear Ointment (DRY EYES OP) Apply 1 drop to eye daily as needed (for dry eyes).     . Calcium-Phosphorus-Vitamin D (CITRACAL +D3 PO) Take 2 tablets by mouth 2 (two) times daily.     . cyclobenzaprine (FLEXERIL) 10 MG tablet TAKE ONE TABLET BY MOUTH THREE TIMES DAILY AS NEEDED FOR MUSCLE SPASM 30 tablet 0  . gemfibrozil (LOPID) 600 MG tablet Take 1 tablet (600 mg total) by mouth 2 (two) times daily before a meal. 180 tablet 0  . ibuprofen (ADVIL,MOTRIN) 200 MG tablet Take 400 mg by mouth 2 (two) times daily as needed for moderate pain.    Marland Kitchen losartan-hydrochlorothiazide (HYZAAR) 50-12.5 MG per tablet Take 1 tablet by mouth daily. 90 tablet 1  . Multiple Vitamins-Minerals (MULTIVITAMIN WITH MINERALS) tablet Take 1 tablet by mouth daily. Reported on 12/20/2015    . nystatin (MYCOSTATIN/NYSTOP) 100000 UNIT/GM POWD Apply to lower chest wall flap on both sides daily. 30 g 0  . oxyCODONE-acetaminophen (ROXICET) 5-325 MG tablet Take 1-2 tablets by mouth every 4 (four) hours as needed. 50 tablet 0  . pantoprazole (PROTONIX) 40 MG tablet Take 1 tablet (40 mg total) by mouth daily. (Patient  taking differently: Take 40 mg by mouth daily as needed (for acid reflux or heartburn). ) 30 tablet 0  . traMADol (ULTRAM) 50 MG tablet Take 1-2 tablets (50-100 mg total) by mouth every 6 (six) hours as needed for moderate pain or severe pain. 60 tablet 0   No current facility-administered medications for this visit.    OBJECTIVE: middle-aged white woman Who appears older than stated age 41 Vitals:   02/23/16 1242  BP: 144/70  Pulse: 64  Temp: 98.5 F (36.9 C)  Resp: 19     Body mass index is 30.25 kg/(m^2).    ECOG FS:1 - Symptomatic but completely ambulatory  Sclerae unicteric, pupils round and equal Oropharynx clear and moist-- no thrush or other lesions No cervical or  supraclavicular adenopathy Lungs no rales or rhonchi Heart regular rate and rhythm Abd soft, obese, nontender, positive bowel sounds MSK no focal spinal tenderness, no upper extremity lymphedema Neuro: nonfocal, well oriented, appropriate affect Breasts: Status post bilateral mastectomies. The incisions are healing nicely. There is mild erythema along the inferior surface of the left-sided incision. There is minimal induration in that area suggesting possible seroma. Both axillae are benign.   LAB RESULTS:  CMP     Component Value Date/Time   NA 140 02/02/2016 1312   NA 141 01/26/2016 0430   K 4.3 02/02/2016 1312   K 4.5 01/26/2016 0430   CL 112* 01/26/2016 0430   CO2 23 02/02/2016 1312   CO2 25 01/26/2016 0430   GLUCOSE 100 02/02/2016 1312   GLUCOSE 137* 01/26/2016 0430   BUN 12.1 02/02/2016 1312   BUN 8 01/26/2016 0430   CREATININE 0.8 02/02/2016 1312   CREATININE 0.77 01/26/2016 0430   CALCIUM 9.3 02/02/2016 1312   CALCIUM 8.4* 01/26/2016 0430   PROT 6.7 02/02/2016 1312   PROT 7.1 09/06/2015 1219   ALBUMIN 3.2* 02/02/2016 1312   ALBUMIN 4.0 09/06/2015 1219   AST 24 02/02/2016 1312   AST 20 09/06/2015 1219   ALT 25 02/02/2016 1312   ALT 21 09/06/2015 1219   ALKPHOS 92 02/02/2016 1312    ALKPHOS 67 09/06/2015 1219   BILITOT <0.30 02/02/2016 1312   BILITOT 0.4 09/06/2015 1219   GFRNONAA >60 01/26/2016 0430   GFRAA >60 01/26/2016 0430    INo results found for: SPEP, UPEP  Lab Results  Component Value Date   WBC 8.8 02/02/2016   NEUTROABS 6.0 02/02/2016   HGB 11.3* 02/02/2016   HCT 34.0* 02/02/2016   MCV 85.2 02/02/2016   PLT 404* 02/02/2016      Chemistry      Component Value Date/Time   NA 140 02/02/2016 1312   NA 141 01/26/2016 0430   K 4.3 02/02/2016 1312   K 4.5 01/26/2016 0430   CL 112* 01/26/2016 0430   CO2 23 02/02/2016 1312   CO2 25 01/26/2016 0430   BUN 12.1 02/02/2016 1312   BUN 8 01/26/2016 0430   CREATININE 0.8 02/02/2016 1312   CREATININE 0.77 01/26/2016 0430      Component Value Date/Time   CALCIUM 9.3 02/02/2016 1312   CALCIUM 8.4* 01/26/2016 0430   ALKPHOS 92 02/02/2016 1312   ALKPHOS 67 09/06/2015 1219   AST 24 02/02/2016 1312   AST 20 09/06/2015 1219   ALT 25 02/02/2016 1312   ALT 21 09/06/2015 1219   BILITOT <0.30 02/02/2016 1312   BILITOT 0.4 09/06/2015 1219       No results found for: LABCA2  No components found for: [id number]  No results for input(s): INR in the last 168 hours.  Urinalysis    Component Value Date/Time   COLORURINE YELLOW 01/17/2016 0910   APPEARANCEUR CLEAR 01/17/2016 0910   LABSPEC 1.022 01/17/2016 0910   PHURINE 6.0 01/17/2016 0910   GLUCOSEU NEGATIVE 01/17/2016 0910   HGBUR NEGATIVE 01/17/2016 0910   BILIRUBINUR NEGATIVE 01/17/2016 0910   KETONESUR NEGATIVE 01/17/2016 0910   PROTEINUR NEGATIVE 01/17/2016 0910   NITRITE NEGATIVE 01/17/2016 0910   LEUKOCYTESUR TRACE* 01/17/2016 0910    STUDIES: Nm Sentinel Node Inj-no Rpt (breast)  01/25/2016  CLINICAL DATA: right breast cancer Sulfur colloid was injected intradermally by the nuclear medicine technologist for breast cancer sentinel node localization.    ASSESSMENT: 70 y.o. [place] woman  (1) status post  bilateral breast biopsies and  left axillary lymph node biopsy 12/12/2015, showing  (a) on the Right, a clinical mT1c N1, stage IIA, estrogen and progesterone receptor positive, Kristen Howe-2 not amplified, with an MIB-1 of 20%  (b) on the left a clinical mT2 pN1, stage IIB invasive ductal carcinoma, grade 2 over 3, estrogen and progesterone receptor positive, Kristen Howe-2 not amplified, with MIB-1 between 5 and 40%  (c) chest CT and bone scan 01/04/2016 showed no evidence of metastatic disease. They did suggest a large esophageal diverticulum  (2) Status post right mastectomy with sentinel lymph node sampling and left modified radical mastectomy 01/25/2016  (a) right side: pT2, pN1, stage IIB invasive ductal carcinoma, grade 2, with negative margins  (b) left side: pT3 pN1, stage IIA invasive ductal carcinoma, grade 2, with negative margins  (c) both cancers were estrogen and progesterone receptor positive, Kristen Howe-2 nonamplified, with MIB-1 between 5 and 20%   (3) Mammaprint on the left-sided tumor came back low risk; right sided Mammaprint pending   (4) no chemotherapy and no right-sided axillary lymph node dissection recommended at conference 02/21/2016  (5) adjuvant radiation to follow   (6) anti-estrogens to follow radiation  PLAN: I spent approximately 40 minutes today with  Kristen Howe and Kristen Howe sister going over Kristen Howe situation. She understands we still do not have the Mammaprint on the right sided tumor, but that was the one that had the lower proliferation fraction and I will be surprised if it comes in high risk.  On the expectation that the right sided tumor also would be low risk, Kristen Howe case was discussed at the multidisciplinary breast cancer conference this week. We felt we did not need to proceed to full axillary lymph node dissection on the right but that she would need adjuvant radiation. Once that is completed she will start anti-estrogen treatment.  Kristen Howe also was found to have what appears to be a large esophageal diverticulum that needs  to be addressed. In addition she has never had a colonoscopy. With all this in mind I am setting Kristen Howe up for evaluation at [facility].  We discussed the mechanics and some of the side effects of radiation and she will discuss this further with Dr. Lisbeth Renshaw. I have placed that request. Kristen Howe we'll then return to see me when she completes Kristen Howe radiation treatments. At that point I will suggest she started anastrozole (Kristen Howe sister takes tamoxifen).  She has a good understanding of the overall plan. She agrees with it. She knows a goal of treatment in Kristen Howe case is cure. She will call with any problems that may develop before Kristen Howe next visit here.    Chauncey Cruel, MD   02/23/2016 12:49 PM Medical Oncology and Hematology [facility] [address] [place], [place] [address] Tel. [phone]    Fax. [phone]

## 2016-02-23 NOTE — Telephone Encounter (Signed)
Gave patient avs report and appointments for June - no availability late May. Patient aware she will be contacted re appointments with Dr. Lisbeth Renshaw and [facility] - message to GM to enter referrals for these two appointment requests.

## 2016-02-25 ENCOUNTER — Other Ambulatory Visit: Payer: Self-pay | Admitting: Oncology

## 2016-02-25 DIAGNOSIS — C50212 Malignant neoplasm of upper-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 breast: Secondary | ICD-10-CM

## 2016-02-26 DIAGNOSIS — C50411 Malignant neoplasm of upper-outer quadrant of right female breast: Secondary | ICD-10-CM | POA: Diagnosis not present

## 2016-02-27 ENCOUNTER — Telehealth: Payer: Self-pay | Admitting: Oncology

## 2016-02-27 NOTE — Telephone Encounter (Signed)
Referrals for radonc and [facility] entered. Patient already on schedule for appointments at [facility]. Called patient and left message re appointment with Dr. Lisbeth Renshaw 3/27 @ 2:30 pm. Also confirmed appointments on schedule at [facility] for 4/17 and 5/1 - provided information to [place] for patient to f/u re appointments if she has not already been contacted. Mailed updated schedule, including appointments for Dr. Lisbeth Renshaw, [facility] and Dr. Jana Hakim.

## 2016-02-27 NOTE — Telephone Encounter (Signed)
Opened in error. See 3/10 note.

## 2016-03-04 ENCOUNTER — Encounter: Payer: Self-pay | Admitting: *Deleted

## 2016-03-04 NOTE — Progress Notes (Signed)
Received mammaprint results of low risk on right breast.  In-basket to team complete.

## 2016-03-05 ENCOUNTER — Encounter (HOSPITAL_COMMUNITY): Payer: Self-pay

## 2016-03-11 ENCOUNTER — Ambulatory Visit
Admission: RE | Admit: 2016-03-11 | Discharge: 2016-03-11 | Disposition: A | Discharge status: Home / Self Care | Payer: Medicare Other | Source: Ambulatory Visit | Attending: Radiation Oncology | Admitting: Radiation Oncology

## 2016-03-11 ENCOUNTER — Encounter: Payer: Self-pay | Admitting: Radiation Oncology

## 2016-03-11 VITALS — BP 151/97 | HR 81 | Temp 98.7°F | Resp 20 | Ht 60.0 in | Wt 160.0 lb

## 2016-03-11 DIAGNOSIS — C50411 Malignant neoplasm of upper-outer quadrant of right female breast: Secondary | ICD-10-CM

## 2016-03-11 DIAGNOSIS — C50412 Malignant neoplasm of upper-outer quadrant of left female breast: Secondary | ICD-10-CM | POA: Diagnosis not present

## 2016-03-11 DIAGNOSIS — C50212 Malignant neoplasm of upper-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 breast: Secondary | ICD-10-CM

## 2016-03-11 DIAGNOSIS — Z170 Estrogen receptor positive status [ER+]: Secondary | ICD-10-CM | POA: Diagnosis not present

## 2016-03-11 DIAGNOSIS — Z510 Encounter for antineoplastic radiation therapy: Secondary | ICD-10-CM | POA: Diagnosis not present

## 2016-03-11 NOTE — Progress Notes (Signed)
Please see the Nurse Progress Note in the MD Initial Consult Encounter for this patient. 

## 2016-03-11 NOTE — Progress Notes (Signed)
Radiation Oncology         [phone] ________________________________  Name: Kristen Howe MRN: [id number]  Date: 03/11/2016  DOB: 03/19/1946  Follow-Up Visit Note  CC: Mauricio Po, FNP  Stark Klein, MD  Diagnosis:   Bilateral Breast cancer:    Right- Stage IIB (pT2, pN1) Invasive Ductal Carcinoma, Grade 2   Left-    Stage IIA (pT3, pN1) Invasive Ductal Carcinoma, Grade 2  Narrative:  The patient returns today for routine follow-up after her initial consultation in breast clinic in January 2017. She underwent a right mastectomy, sentinel lymph node mapping, and left modified radical mastectomy 01/25/2016. The pathology of the right breast revealed invasive ductal carcinoma, 2.1 cm, separate focus of DCIS, 0.5 cm, and 1/4 positive lymph nodes. The pathology of the right breast revealed invasive and in situ ductal carcinoma, 6.5 cm, and 1/15 lymph nodes positive. Her mammoprint score was low and both tumors were ER/PR positive. She will plan to start antiestrogen therapy once she completes radiotherapy, and comes today to discuss this further with Dr. Lisbeth Renshaw.   ALLERGIES:  has No Known Allergies.  Meds: Current Outpatient Prescriptions  Medication Sig Dispense Refill  . Artificial Tear Ointment (DRY EYES OP) Apply 1 drop to eye daily as needed (for dry eyes).     . Calcium-Phosphorus-Vitamin D (CITRACAL +D3 PO) Take 2 tablets by mouth 2 (two) times daily.     . cyclobenzaprine (FLEXERIL) 10 MG tablet TAKE ONE TABLET BY MOUTH THREE TIMES DAILY AS NEEDED FOR MUSCLE SPASM 30 tablet 0  . gemfibrozil (LOPID) 600 MG tablet Take 1 tablet (600 mg total) by mouth 2 (two) times daily before a meal. 180 tablet 0  . ibuprofen (ADVIL,MOTRIN) 200 MG tablet Take 400 mg by mouth 2 (two) times daily as needed for moderate pain.    Marland Kitchen losartan-hydrochlorothiazide (HYZAAR) 50-12.5 MG per tablet Take 1 tablet by mouth daily. 90 tablet 1  . Multiple Vitamins-Minerals (MULTIVITAMIN WITH MINERALS) tablet  Take 1 tablet by mouth daily. Reported on 12/20/2015    . pantoprazole (PROTONIX) 40 MG tablet Take 1 tablet (40 mg total) by mouth daily. (Patient taking differently: Take 40 mg by mouth daily as needed (for acid reflux or heartburn). ) 30 tablet 0   No current facility-administered medications for this encounter.    Physical Findings:  height is 5' (1.524 m) and weight is 160 lb (72.576 kg). Her oral temperature is 98.7 F (37.1 C). Her blood pressure is 151/97 and her pulse is 81. Her respiration is 20. .   Pain scale 0/10 In general this is a well appearing Caucasian female in no acute distress. She is alert and oriented x4 and appropriate throughout the examination. HEENT reveals that the patient is normocephalic, atraumatic. EOMs are intact. PERRLA. Skin is intact without any evidence of gross lesions. Cardiovascular exam reveals a regular rate and rhythm, no clicks rubs or murmurs are auscultated. Chest is clear to auscultation bilaterally. Lymphatic assessment is performed and does not reveal any adenopathy in the cervical, supraclavicular, axillary, or inguinal chains. The bilateral breast tissue has been excised and mastectomy scars are well healed at this time. No palpable breast tissue persists. No nipple tissue is present. Abdomen has active bowel sounds in all quadrants and is intact. The abdomen is soft, non tender, non distended. Lower extremities are negative for pretibial pitting edema, deep calf tenderness, cyanosis or clubbing.   Lab Findings: Lab Results  Component Value Date   WBC 8.8 02/02/2016  HGB 11.3* 02/02/2016   HCT 34.0* 02/02/2016   MCV 85.2 02/02/2016   PLT 404* 02/02/2016     Radiographic Findings: No results found.  Impression:    [name] is a 70 yo female with Bilateral breast cancer. Mammoprint revealed that she is low risk bilaterally in terms of chemotherapy need, she will begin antiestrogen therapy following radiotherapy.  Plan:  Dr. Lisbeth Renshaw  discusses the findings from her pathology specimens and the rationale for proceeding with radiotherapy to the chest wall and regional lymph nodes. He reviews the rationale for 33 fractions to a total of 6-1/2 weeks of treatment. He reviews the risks, benefits, long and short-term effects of radiotherapy. The patient is interested in moving forward. Written consent is obtained. She will be scheduled for simulation later this week. She is encouraged to call should any questions or concerns prior to her first treatment visit.  Of note the patient's 20 sister also has bilateral breast cancer around the age of 35. Although her to his sister did move forward with genetic testing at that time, I have encouraged that she reconsider another evaluation with genetics and whether or not her twin sister may benefit from this as well as new advances and genetic panels have been underway since her sister was tested. She is interested in discussing this further and referral was placed.  The above documentation reflects my direct findings during this shared patient visit. Please see the separate note by Dr. Lisbeth Renshaw on this date for the remainder of the patient's plan of care.    Carola Rhine, PAC   This document serves as a record of services personally performed by Carola Rhine, PAC and Kyung Rudd, MD. It was created on their behalf by  Lendon Collar, a trained medical scribe. The creation of this record is based on the scribe's personal observations and the provider's statements to them. This document has been checked and approved by the attending provider.

## 2016-03-11 NOTE — Progress Notes (Signed)
Location of Breast Cancer: Upper Outer Right Breast Upper Outer Quadrant  Histology per Pathology Report: Diagnosis 12/12/15: 1. Breast, right, needle core biopsy, 9:30 o'clock - INVASIVE DUCTAL CARCINOMA, GRADE 2.SEE MICROSCOPIC DESCRIPTION 2. Breast, left, needle core biopsy, 11:30 o'clock - INVASIVE DUCTAL CARCINOMA, GRADE 2.- SEE MICROSCOPIC DESCRIPTION 3. Lymph node, needle/core biopsy, left axilla - CARCINOMA CONSISTENT WITH METASTATIC CARCINOMA.- SEE MICROSCOPIC DESCRIPTION 4. Breast, left, needle core biopsy, retroareolar- DUCTAL CARCINOMA.    Receptor Status: ER(100%+), PR (80%+), Her2-neu (neg),   Did patient present with symptoms (if so, please note symptoms) or was this found on screening mammography?:  Past/Anticipated interventions by surgeon, if ACZ:[id number] 01/25/16:Dr. Ralene Muskrat 1. Lymph node, sentinel, biopsy, right axillary #1 - ONE BENIGN LYMPH NODE (0/1). 2. Lymph node, sentinel, biopsy, right axillary #2 - ONE BENIGN LYMPH NODE (0/1). 3. Lymph node, sentinel, biopsy, right axillary #3 - METASTATIC CARCINOMA IN ONE LYMPH NODE (1/1). 4. Lymph node, sentinel, biopsy, right axillary #4 - ONE BENIGN LYMPH NODE (0/1). 5. Breast, simple mastectomy, right - INVASIVE DUCTAL CARCINOMA, 2.1 CM. - SEPARATE FOCUS OF DUCTAL CARCINOMA IN SITU, 0.5 CM. - MARGINS NOT INVOLVED. - FIBROCYSTIC CHANGES. - SEE ONCOLOGY TABLE BELOW. 6. Breast, modified radical mastectomy , left - INVASIVE AND IN SITU DUCTAL CARCINOMA, 6.5 CM. - MARGINS NOT INVOLVED. - METASTATIC CARCINOMA IN ONE OF FIFTEEN LYMPH NODES (1/15). - SEE ONCOLOGY TABLE BELOW. 2 of  Past/Anticipated interventions by medical oncology, if any: Chemotherapy :Dr, Jana Hakim 02/23/16,   Lymphedema issues, if any:  No  Pain issues, if any:  Left breast soreness   SAFETY ISSUES:  Prior radiation? NO  Pacemaker/ICD? NO  Possible current pregnancy? NO  Is the patient on methotrexate? NO  Current Complaints /  other details:Seen in [facility] 12/20/15 By Dr. Lisbeth Renshaw, ,Divorced, menarche age 75,GxP2, 20st live birth age 61,No HRT, remote birth control pills 3 years, menses stopped 1997,post-menopausal Former cigarette smoker, .58 Father Lymphoma,  Deceased, Twin ,sister Breast cancer  1995, then other breast 2012, lumpectomies , living ,   Allergies:NKA BP 151/97 mmHg  Pulse 81  Temp(Src) 98.7 F (37.1 C) (Oral)  Resp 20  Ht 5' (1.524 m)  Wt 160 lb (72.576 kg)  BMI 31.25 kg/m2  Wt Readings from Last 3 Encounters:  03/11/16 160 lb (72.576 kg)  02/23/16 154 lb 14.4 oz (70.262 kg)  01/25/16 164 lb (74.39 kg)

## 2016-03-13 ENCOUNTER — Ambulatory Visit
Admission: RE | Admit: 2016-03-13 | Discharge: 2016-03-13 | Disposition: A | Discharge status: Home / Self Care | Payer: Medicare Other | Source: Ambulatory Visit | Attending: Radiation Oncology | Admitting: Radiation Oncology

## 2016-03-13 DIAGNOSIS — C50411 Malignant neoplasm of upper-outer quadrant of right female breast: Secondary | ICD-10-CM

## 2016-03-13 DIAGNOSIS — C50212 Malignant neoplasm of upper-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 breast: Secondary | ICD-10-CM | POA: Diagnosis not present

## 2016-03-13 DIAGNOSIS — Z510 Encounter for antineoplastic radiation therapy: Secondary | ICD-10-CM | POA: Diagnosis not present

## 2016-03-13 DIAGNOSIS — Z170 Estrogen receptor positive status [ER+]: Secondary | ICD-10-CM | POA: Diagnosis not present

## 2016-03-15 NOTE — Addendum Note (Signed)
Encounter addended by: Doreen Beam, RN on: 03/15/2016  8:33 AM<BR>     Documentation filed: Charges VN

## 2016-03-18 DIAGNOSIS — C50411 Malignant neoplasm of upper-outer quadrant of right female breast: Secondary | ICD-10-CM | POA: Diagnosis not present

## 2016-03-18 DIAGNOSIS — Z510 Encounter for antineoplastic radiation therapy: Secondary | ICD-10-CM | POA: Diagnosis not present

## 2016-03-18 DIAGNOSIS — C50212 Malignant neoplasm of upper-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 breast: Secondary | ICD-10-CM | POA: Diagnosis not present

## 2016-03-18 DIAGNOSIS — Z170 Estrogen receptor positive status [ER+]: Secondary | ICD-10-CM | POA: Diagnosis not present

## 2016-03-19 DIAGNOSIS — C50411 Malignant neoplasm of upper-outer quadrant of right female breast: Secondary | ICD-10-CM | POA: Diagnosis not present

## 2016-03-19 DIAGNOSIS — Z510 Encounter for antineoplastic radiation therapy: Secondary | ICD-10-CM | POA: Diagnosis not present

## 2016-03-19 DIAGNOSIS — C50212 Malignant neoplasm of upper-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 breast: Secondary | ICD-10-CM | POA: Diagnosis not present

## 2016-03-19 DIAGNOSIS — Z170 Estrogen receptor positive status [ER+]: Secondary | ICD-10-CM | POA: Diagnosis not present

## 2016-03-20 ENCOUNTER — Ambulatory Visit
Admission: RE | Admit: 2016-03-20 | Discharge: 2016-03-20 | Disposition: A | Discharge status: Home / Self Care | Payer: Medicare Other | Source: Ambulatory Visit | Attending: Radiation Oncology | Admitting: Radiation Oncology

## 2016-03-20 DIAGNOSIS — C50212 Malignant neoplasm of upper-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 breast: Secondary | ICD-10-CM | POA: Diagnosis not present

## 2016-03-20 DIAGNOSIS — C50121 Malignant neoplasm of central portion of right male breast: Secondary | ICD-10-CM

## 2016-03-20 DIAGNOSIS — Z170 Estrogen receptor positive status [ER+]: Secondary | ICD-10-CM | POA: Diagnosis not present

## 2016-03-20 DIAGNOSIS — C50411 Malignant neoplasm of upper-outer quadrant of right female breast: Secondary | ICD-10-CM | POA: Diagnosis not present

## 2016-03-20 DIAGNOSIS — C50122 Malignant neoplasm of central portion of left male breast: Principal | ICD-10-CM

## 2016-03-20 DIAGNOSIS — Z510 Encounter for antineoplastic radiation therapy: Secondary | ICD-10-CM | POA: Diagnosis not present

## 2016-03-20 MED ORDER — RADIAPLEXRX EX GEL
Freq: Once | CUTANEOUS | Status: AC
  Administered 2016-03-20: 12:00:00 via TOPICAL

## 2016-03-20 MED ORDER — ALRA NON-METALLIC DEODORANT (RAD-ONC)
1.0000 "application " | Freq: Once | TOPICAL | Status: AC
  Administered 2016-03-20: 1 via TOPICAL

## 2016-03-20 NOTE — Progress Notes (Signed)
Patient education, radiation therapy and you book, my business card, radiaplex and alra products given to the patient, discussed ways to manage side effects, skin irritation, sore, swelling, pain, fatigue, electric razor, apply rqadaiplex gel after rad and bedtime, alra prn after ad tx, , increase protein in diet, stay hydrated, drink more fluids,waterteach back given

## 2016-03-21 ENCOUNTER — Encounter: Payer: Self-pay | Admitting: Radiation Oncology

## 2016-03-21 ENCOUNTER — Ambulatory Visit
Admission: RE | Admit: 2016-03-21 | Discharge: 2016-03-21 | Disposition: A | Discharge status: Home / Self Care | Payer: Medicare Other | Source: Ambulatory Visit | Attending: Radiation Oncology | Admitting: Radiation Oncology

## 2016-03-21 VITALS — BP 161/86 | HR 71 | Temp 97.8°F | Resp 20 | Wt 158.6 lb

## 2016-03-21 DIAGNOSIS — C50411 Malignant neoplasm of upper-outer quadrant of right female breast: Secondary | ICD-10-CM | POA: Diagnosis not present

## 2016-03-21 DIAGNOSIS — Z510 Encounter for antineoplastic radiation therapy: Secondary | ICD-10-CM | POA: Diagnosis not present

## 2016-03-21 DIAGNOSIS — C50212 Malignant neoplasm of upper-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 breast: Secondary | ICD-10-CM | POA: Diagnosis not present

## 2016-03-21 DIAGNOSIS — Z170 Estrogen receptor positive status [ER+]: Secondary | ICD-10-CM | POA: Diagnosis not present

## 2016-03-21 NOTE — Progress Notes (Signed)
  Radiation Oncology         [phone]   Name: Kristen Howe MRN: [id number]   Date: 03/21/2016  DOB: August 24, 1946   Weekly Radiation Therapy Management   Current Dose: 1.8 Gy  Planned Dose:  60.4 Gy  Narrative The patient presents for routine under treatment assessment.  Weekly radiation treatment B/L chewst wall/breast. Patient education was done yesterday. No skin changes noted. She will start using radaiplex bid. She reports a good appetite.   Set-up films were reviewed. The chart was checked.  Physical Findings  weight is 158 lb 9.6 oz (71.94 kg). Her oral temperature is 97.8 F (36.6 C). Her blood pressure is 161/86 and her pulse is 71. Her respiration is 20.  Weight essentially stable. No significant changes.  Impression The patient is tolerating radiation.  Plan Continue treatment as planned. She will continue seeing Dr. Lisbeth Renshaw.     Sheral Apley Tammi Klippel, M.D.  This document serves as a record of services personally performed by Shona Simpson, PA and Tyler Pita, MD. It was created on their behalf by Jenell Milliner, a trained medical scribe. The creation of this record is based on the scribe's personal observations and the provider's statements to them. This document has been checked and approved by the attending provider.

## 2016-03-21 NOTE — Progress Notes (Signed)
Weekly radiation treatment  B/L chewst wall/breast,  1/ 33 completed, patient education done yesterday,  No skin changes, will start using radaiplex bid, appetite good, energy level pretty good 12:17 PM Wt Readings from Last 3 Encounters:  03/11/16 160 lb (72.576 kg)  02/23/16 154 lb 14.4 oz (70.262 kg)  01/25/16 164 lb (74.39 kg)  BP 161/86 mmHg  Pulse 71  Temp(Src) 97.8 F (36.6 C) (Oral)  Resp 20  Wt 158 lb 9.6 oz (71.94 kg)

## 2016-03-22 ENCOUNTER — Ambulatory Visit
Admission: RE | Admit: 2016-03-22 | Discharge: 2016-03-22 | Disposition: A | Discharge status: Home / Self Care | Payer: Medicare Other | Source: Ambulatory Visit | Attending: Radiation Oncology | Admitting: Radiation Oncology

## 2016-03-22 DIAGNOSIS — C50212 Malignant neoplasm of upper-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 breast: Secondary | ICD-10-CM | POA: Diagnosis not present

## 2016-03-22 DIAGNOSIS — Z510 Encounter for antineoplastic radiation therapy: Secondary | ICD-10-CM | POA: Diagnosis not present

## 2016-03-22 DIAGNOSIS — C50411 Malignant neoplasm of upper-outer quadrant of right female breast: Secondary | ICD-10-CM | POA: Diagnosis not present

## 2016-03-22 DIAGNOSIS — Z170 Estrogen receptor positive status [ER+]: Secondary | ICD-10-CM | POA: Diagnosis not present

## 2016-03-25 ENCOUNTER — Ambulatory Visit
Admission: RE | Admit: 2016-03-25 | Discharge: 2016-03-25 | Disposition: A | Discharge status: Home / Self Care | Payer: Medicare Other | Source: Ambulatory Visit | Attending: Radiation Oncology | Admitting: Radiation Oncology

## 2016-03-25 ENCOUNTER — Encounter: Payer: Self-pay | Admitting: *Deleted

## 2016-03-25 DIAGNOSIS — Z170 Estrogen receptor positive status [ER+]: Secondary | ICD-10-CM | POA: Diagnosis not present

## 2016-03-25 DIAGNOSIS — C50212 Malignant neoplasm of upper-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 breast: Secondary | ICD-10-CM | POA: Diagnosis not present

## 2016-03-25 DIAGNOSIS — Z510 Encounter for antineoplastic radiation therapy: Secondary | ICD-10-CM | POA: Diagnosis not present

## 2016-03-25 DIAGNOSIS — C50411 Malignant neoplasm of upper-outer quadrant of right female breast: Secondary | ICD-10-CM | POA: Diagnosis not present

## 2016-03-25 NOTE — Progress Notes (Signed)
[facility] Psychosocial Distress Screening Clinical Social Work  Clinical Social Work was referred by distress screening protocol.  The patient scored a 8 on the Psychosocial Distress Thermometer which indicates severe distress. Clinical Social Worker phoned pt to assess for distress and other psychosocial needs. CSW left brief, [facility] compliant message to return CSW call.   ONCBCN DISTRESS SCREENING 03/11/2016  Screening Type Initial Screening  Distress experienced in past week (1-10) 8  Emotional problem type Nervousness/Anxiety;Adjusting to illness;Adjusting to appearance changes  Physical Problem type Sleep/insomnia;Skin dry/itchy  Physician notified of physical symptoms Yes  Referral to clinical social work Yes    Clinical Social Worker follow up needed: Yes.    If yes, follow up plan: CSW awaits return call.  Loren Racer, [place] Worker [place]  [place] Phone: [phone] Fax: [phone]

## 2016-03-26 ENCOUNTER — Ambulatory Visit
Admission: RE | Admit: 2016-03-26 | Discharge: 2016-03-26 | Disposition: A | Discharge status: Home / Self Care | Payer: Medicare Other | Source: Ambulatory Visit | Attending: Radiation Oncology | Admitting: Radiation Oncology

## 2016-03-26 DIAGNOSIS — Z510 Encounter for antineoplastic radiation therapy: Secondary | ICD-10-CM | POA: Diagnosis not present

## 2016-03-26 DIAGNOSIS — Z170 Estrogen receptor positive status [ER+]: Secondary | ICD-10-CM | POA: Diagnosis not present

## 2016-03-26 DIAGNOSIS — C50212 Malignant neoplasm of upper-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 breast: Secondary | ICD-10-CM | POA: Diagnosis not present

## 2016-03-26 DIAGNOSIS — C50411 Malignant neoplasm of upper-outer quadrant of right female breast: Secondary | ICD-10-CM | POA: Diagnosis not present

## 2016-03-27 ENCOUNTER — Telehealth: Payer: Self-pay

## 2016-03-27 ENCOUNTER — Ambulatory Visit
Admission: RE | Admit: 2016-03-27 | Discharge: 2016-03-27 | Disposition: A | Discharge status: Home / Self Care | Payer: Medicare Other | Source: Ambulatory Visit | Attending: Radiation Oncology | Admitting: Radiation Oncology

## 2016-03-27 DIAGNOSIS — C50411 Malignant neoplasm of upper-outer quadrant of right female breast: Secondary | ICD-10-CM | POA: Diagnosis not present

## 2016-03-27 DIAGNOSIS — Z170 Estrogen receptor positive status [ER+]: Secondary | ICD-10-CM | POA: Diagnosis not present

## 2016-03-27 DIAGNOSIS — C50212 Malignant neoplasm of upper-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 breast: Secondary | ICD-10-CM | POA: Diagnosis not present

## 2016-03-27 DIAGNOSIS — Z510 Encounter for antineoplastic radiation therapy: Secondary | ICD-10-CM | POA: Diagnosis not present

## 2016-03-27 NOTE — Telephone Encounter (Signed)
Dr Ardis Hughs is this ok to leave as direct?

## 2016-03-27 NOTE — Telephone Encounter (Signed)
Please review recent treatments for breast CA.  Thank you, Tyreik Delahoussaye/PV

## 2016-03-28 ENCOUNTER — Ambulatory Visit
Admission: RE | Admit: 2016-03-28 | Discharge: 2016-03-28 | Disposition: A | Discharge status: Home / Self Care | Payer: Medicare Other | Source: Ambulatory Visit | Attending: Radiation Oncology | Admitting: Radiation Oncology

## 2016-03-28 ENCOUNTER — Telehealth: Payer: Self-pay | Admitting: *Deleted

## 2016-03-28 DIAGNOSIS — C50411 Malignant neoplasm of upper-outer quadrant of right female breast: Secondary | ICD-10-CM | POA: Diagnosis not present

## 2016-03-28 DIAGNOSIS — C50212 Malignant neoplasm of upper-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 breast: Secondary | ICD-10-CM | POA: Diagnosis not present

## 2016-03-28 DIAGNOSIS — Z510 Encounter for antineoplastic radiation therapy: Secondary | ICD-10-CM | POA: Diagnosis not present

## 2016-03-28 DIAGNOSIS — Z170 Estrogen receptor positive status [ER+]: Secondary | ICD-10-CM | POA: Diagnosis not present

## 2016-03-28 NOTE — Telephone Encounter (Signed)
I reviewed note from referring oncologist: he wants [place] to address and esophageal divertculum as well as so I think she is best to be seen in the office prior to any procedures.

## 2016-03-28 NOTE — Telephone Encounter (Signed)
LM for pt to call us related to MD wanting to see her in office.  No other details such as pt's name left on machine as there was no ID on pt's voicemail.  Angela/PV

## 2016-03-28 NOTE — Telephone Encounter (Signed)
  Oncology Nurse Navigator Documentation    Navigator Encounter Type: Telephone (03/28/16 1000) Telephone: Outgoing Call;Patient Update (03/28/16 1000) Abnormal Finding Date: 11/29/15 (03/28/16 1000) Confirmed Diagnosis Date: 12/12/15 (03/28/16 1000) Surgery Date: 01/25/16 (03/28/16 1000) Treatment Initiated Date: 01/25/16 (03/28/16 1000) Patient Visit Type: RadOnc (03/28/16 1000) Treatment Phase: First Radiation Tx (03/28/16 1000) Barriers/Navigation Needs: No barriers at this time;No Questions;No Needs (03/28/16 1000)   Interventions: None required (03/28/16 1000)                      Time Spent with Patient: 15 (03/28/16 1000)

## 2016-03-29 ENCOUNTER — Ambulatory Visit
Admission: RE | Admit: 2016-03-29 | Discharge: 2016-03-29 | Disposition: A | Discharge status: Home / Self Care | Payer: Medicare Other | Source: Ambulatory Visit | Attending: Radiation Oncology | Admitting: Radiation Oncology

## 2016-03-29 ENCOUNTER — Encounter: Payer: Self-pay | Admitting: Radiation Oncology

## 2016-03-29 VITALS — BP 145/81 | HR 66 | Temp 98.0°F | Resp 20 | Wt 160.1 lb

## 2016-03-29 DIAGNOSIS — C50411 Malignant neoplasm of upper-outer quadrant of right female breast: Secondary | ICD-10-CM | POA: Diagnosis not present

## 2016-03-29 DIAGNOSIS — Z170 Estrogen receptor positive status [ER+]: Secondary | ICD-10-CM | POA: Diagnosis not present

## 2016-03-29 DIAGNOSIS — Z510 Encounter for antineoplastic radiation therapy: Secondary | ICD-10-CM | POA: Diagnosis not present

## 2016-03-29 DIAGNOSIS — C50212 Malignant neoplasm of upper-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 breast: Secondary | ICD-10-CM | POA: Diagnosis not present

## 2016-03-29 NOTE — Progress Notes (Signed)
  Radiation Oncology         [phone] ________________________________  Name: Kristen Howe MRN: [id number]  Date: 03/13/2016  DOB: 28-Aug-1946  SIMULATION AND TREATMENT PLANNING NOTE  DIAGNOSIS:     ICD-9-CM ICD-10-CM   1. Breast cancer of upper-outer quadrant of right female breast ([place]) 174.4 C50.411   2. Primary cancer of 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 breast ([facility]) 174.2 C50.212      Site:   1.  Right chest wall/supraclavicular region 2.  Left chest wall/supraclavicular region  NARRATIVE:  The patient was brought to the [facility].  Identity was confirmed.  All relevant records and images related to the planned course of therapy were reviewed.   Written consent to proceed with treatment was confirmed which was freely given after reviewing the details related to the planned course of therapy had been reviewed with the patient.  Then, the patient was set-up in a stable reproducible  supine position for radiation therapy.  CT images were obtained.  Surface markings were placed.    Medically necessary complex treatment device(s) for immobilization:  Customized vac lock bag.   The CT images were loaded into the planning software.  Then the target and avoidance structures were contoured.  Treatment planning then occurred.  The radiation prescription was entered and confirmed.  A total of 8 complex treatment devices were initially fabricated which relate to the designed radiation treatment fields:  4 fields for the chest wall and supraclavicular region on each separate side. Each of these customized fields/ complex treatment devices will be used on a daily basis during the radiation course.  A forward planning technique will also be utilized for each chest wall as necessary to improve the dose homogeneity of the patient's plan to each target site. I have requested : 3D Simulation  I have requested a DVH of the following structures: Target volume, spinal cord, heart,  lungs.   PLAN:  The patient will receive 50.4 Gy in 28 fractions initially. The patient then will receive a 10 gray boost for a total dose of 60.4 gray. Each side will receive this dose.  ________________________________   Jodelle Gross, MD, PhD

## 2016-03-29 NOTE — Progress Notes (Signed)
Weekly radiation B/L  Chest walls,  Slight erythema on skin, dryness mid chest, using radiaplex  Bid , appetite good, mild fatigue, , occcasional twinges 1:51 PM BP 145/81 mmHg  Pulse 66  Temp(Src) 98 F (36.7 C) (Oral)  Resp 20  Wt 160 lb 1.6 oz (72.621 kg)  Wt Readings from Last 3 Encounters:  03/29/16 160 lb 1.6 oz (72.621 kg)  03/21/16 158 lb 9.6 oz (71.94 kg)  03/11/16 160 lb (72.576 kg)

## 2016-03-29 NOTE — Progress Notes (Signed)
  Radiation Oncology         [phone]) [phone] ________________________________  Name: Kristen Howe MRN: [id number]  Date: 03/13/2016  DOB: 05-17-1946  Optical Surface Tracking Plan:  Since intensity modulated radiotherapy (IMRT) and 3D conformal radiation treatment methods are predicated on accurate and precise positioning for treatment, intrafraction motion monitoring is medically necessary to ensure accurate and safe treatment delivery.  The ability to quantify intrafraction motion without excessive ionizing radiation dose can only be performed with optical surface tracking. Accordingly, surface imaging offers the opportunity to obtain 3D measurements of patient position throughout IMRT and 3D treatments without excessive radiation exposure.  I am ordering optical surface tracking for this patient's upcoming course of radiotherapy. ________________________________  Kyung Rudd, MD 03/29/2016 2:52 PM    Reference:   Ursula Alert, J, et al. Surface imaging-based analysis of intrafraction motion for breast radiotherapy patients.Journal of [place], n. 6, nov. 2014. ISSN [id number].   Available at: <http://www.jacmp.org/index.php/jacmp/article/view/4957>.

## 2016-03-29 NOTE — Progress Notes (Signed)
   Department of Radiation Oncology  Phone:  [phone] Fax:        [phone]  Weekly Treatment Note    Name: Kristen Howe Date: 03/29/2016 MRN: [id number] DOB: 24-Dec-1945   Diagnosis:     ICD-9-CM ICD-10-CM   1. Breast cancer of upper-outer quadrant of right female breast ([place]) 174.4 C50.411      Current dose: 12.6 Gy  Current fraction: 7   MEDICATIONS: Current Outpatient Prescriptions  Medication Sig Dispense Refill  . Artificial Tear Ointment (DRY EYES OP) Apply 1 drop to eye daily as needed (for dry eyes).     . Calcium-Phosphorus-Vitamin D (CITRACAL +D3 PO) Take 2 tablets by mouth 2 (two) times daily.     . cyclobenzaprine (FLEXERIL) 10 MG tablet TAKE ONE TABLET BY MOUTH THREE TIMES DAILY AS NEEDED FOR MUSCLE SPASM 30 tablet 0  . gemfibrozil (LOPID) 600 MG tablet Take 1 tablet (600 mg total) by mouth 2 (two) times daily before a meal. 180 tablet 0  . hyaluronate sodium (RADIAPLEXRX) GEL Apply 1 application topically 2 (two) times daily. Apply to breast treated area after rad tx and bedtime    . ibuprofen (ADVIL,MOTRIN) 200 MG tablet Take 400 mg by mouth 2 (two) times daily as needed for moderate pain.    Marland Kitchen losartan-hydrochlorothiazide (HYZAAR) 50-12.5 MG per tablet Take 1 tablet by mouth daily. 90 tablet 1  . Multiple Vitamins-Minerals (MULTIVITAMIN WITH MINERALS) tablet Take 1 tablet by mouth daily. Reported on [id number]    . non-metallic deodorant Jethro Poling) MISC Apply 1 application topically daily as needed.    . pantoprazole (PROTONIX) 40 MG tablet Take 1 tablet (40 mg total) by mouth daily. (Patient taking differently: Take 40 mg by mouth daily as needed (for acid reflux or heartburn). ) 30 tablet 0   No current facility-administered medications for this encounter.     ALLERGIES: Review of patient's allergies indicates no known allergies.   LABORATORY DATA:  Lab Results  Component Value Date   WBC 8.8 02/02/2016   HGB 11.3* 02/02/2016   HCT 34.0*  02/02/2016   MCV 85.2 02/02/2016   PLT 404* 02/02/2016   Lab Results  Component Value Date   NA 140 02/02/2016   K 4.3 02/02/2016   CL 112* 01/26/2016   CO2 23 02/02/2016   Lab Results  Component Value Date   ALT 25 02/02/2016   AST 24 02/02/2016   ALKPHOS 92 02/02/2016   BILITOT <0.30 02/02/2016     NARRATIVE: Kristen Howe was seen today for weekly treatment management. The chart was checked and the patient's films were reviewed.  Weekly radiation B/L  Chest walls,  Slight erythema on skin, dryness mid chest, using radiaplex  Bid , appetite good, mild fatigue, , occcasional twinges 2:41 PM BP 145/81 mmHg  Pulse 66  Temp(Src) 98 F (36.7 C) (Oral)  Resp 20  Wt 160 lb 1.6 oz (72.621 kg)  Wt Readings from Last 3 Encounters:  03/29/16 160 lb 1.6 oz (72.621 kg)  03/21/16 158 lb 9.6 oz (71.94 kg)  03/11/16 160 lb (72.576 kg)     PHYSICAL EXAMINATION: weight is 160 lb 1.6 oz (72.621 kg). Her oral temperature is 98 F (36.7 C). Her blood pressure is 145/81 and her pulse is 66. Her respiration is 20.        ASSESSMENT: The patient is doing satisfactorily with treatment.  PLAN: We will continue with the patient's radiation treatment as planned.

## 2016-03-31 NOTE — Addendum Note (Signed)
Addendum  created 03/31/16 2013 by Roberts Gaudy, MD   Modules edited: Notes Section   Notes Section:  File: [id number]; File: [id number]

## 2016-04-01 ENCOUNTER — Ambulatory Visit
Admission: RE | Admit: 2016-04-01 | Discharge: 2016-04-01 | Disposition: A | Discharge status: Home / Self Care | Payer: Medicare Other | Source: Ambulatory Visit | Attending: Radiation Oncology | Admitting: Radiation Oncology

## 2016-04-01 DIAGNOSIS — C50212 Malignant neoplasm of upper-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 breast: Secondary | ICD-10-CM | POA: Diagnosis not present

## 2016-04-01 DIAGNOSIS — Z510 Encounter for antineoplastic radiation therapy: Secondary | ICD-10-CM | POA: Diagnosis not present

## 2016-04-01 DIAGNOSIS — C50411 Malignant neoplasm of upper-outer quadrant of right female breast: Secondary | ICD-10-CM

## 2016-04-01 DIAGNOSIS — Z170 Estrogen receptor positive status [ER+]: Secondary | ICD-10-CM | POA: Diagnosis not present

## 2016-04-01 MED ORDER — RADIAPLEXRX EX GEL
Freq: Once | CUTANEOUS | Status: AC
Start: 2016-04-01 — End: 2016-04-01
  Administered 2016-04-01: 09:00:00 via TOPICAL

## 2016-04-02 ENCOUNTER — Ambulatory Visit
Admission: RE | Admit: 2016-04-02 | Discharge: 2016-04-02 | Disposition: A | Discharge status: Home / Self Care | Payer: Medicare Other | Source: Ambulatory Visit | Attending: Radiation Oncology | Admitting: Radiation Oncology

## 2016-04-02 DIAGNOSIS — Z510 Encounter for antineoplastic radiation therapy: Secondary | ICD-10-CM | POA: Diagnosis not present

## 2016-04-02 DIAGNOSIS — Z170 Estrogen receptor positive status [ER+]: Secondary | ICD-10-CM | POA: Diagnosis not present

## 2016-04-02 DIAGNOSIS — C50411 Malignant neoplasm of upper-outer quadrant of right female breast: Secondary | ICD-10-CM | POA: Diagnosis not present

## 2016-04-02 DIAGNOSIS — C50212 Malignant neoplasm of upper-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 breast: Secondary | ICD-10-CM | POA: Diagnosis not present

## 2016-04-03 ENCOUNTER — Ambulatory Visit
Admission: RE | Admit: 2016-04-03 | Discharge: 2016-04-03 | Disposition: A | Discharge status: Home / Self Care | Payer: Medicare Other | Source: Ambulatory Visit | Attending: Radiation Oncology | Admitting: Radiation Oncology

## 2016-04-03 DIAGNOSIS — C50411 Malignant neoplasm of upper-outer quadrant of right female breast: Secondary | ICD-10-CM | POA: Diagnosis not present

## 2016-04-03 DIAGNOSIS — C50212 Malignant neoplasm of upper-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 breast: Secondary | ICD-10-CM | POA: Diagnosis not present

## 2016-04-03 DIAGNOSIS — Z510 Encounter for antineoplastic radiation therapy: Secondary | ICD-10-CM | POA: Diagnosis not present

## 2016-04-03 DIAGNOSIS — Z170 Estrogen receptor positive status [ER+]: Secondary | ICD-10-CM | POA: Diagnosis not present

## 2016-04-04 ENCOUNTER — Ambulatory Visit
Admission: RE | Admit: 2016-04-04 | Discharge: 2016-04-04 | Disposition: A | Discharge status: Home / Self Care | Payer: Medicare Other | Source: Ambulatory Visit | Attending: Radiation Oncology | Admitting: Radiation Oncology

## 2016-04-04 DIAGNOSIS — C50212 Malignant neoplasm of upper-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 breast: Secondary | ICD-10-CM | POA: Diagnosis not present

## 2016-04-04 DIAGNOSIS — Z510 Encounter for antineoplastic radiation therapy: Secondary | ICD-10-CM | POA: Diagnosis not present

## 2016-04-04 DIAGNOSIS — C50411 Malignant neoplasm of upper-outer quadrant of right female breast: Secondary | ICD-10-CM | POA: Diagnosis not present

## 2016-04-04 DIAGNOSIS — Z170 Estrogen receptor positive status [ER+]: Secondary | ICD-10-CM | POA: Diagnosis not present

## 2016-04-05 ENCOUNTER — Ambulatory Visit
Admission: RE | Admit: 2016-04-05 | Discharge: 2016-04-05 | Disposition: A | Discharge status: Home / Self Care | Payer: Medicare Other | Source: Ambulatory Visit | Attending: Radiation Oncology | Admitting: Radiation Oncology

## 2016-04-05 VITALS — BP 137/86 | HR 73 | Temp 98.9°F | Resp 12 | Wt 160.0 lb

## 2016-04-05 DIAGNOSIS — Z170 Estrogen receptor positive status [ER+]: Secondary | ICD-10-CM | POA: Diagnosis not present

## 2016-04-05 DIAGNOSIS — C50411 Malignant neoplasm of upper-outer quadrant of right female breast: Secondary | ICD-10-CM

## 2016-04-05 DIAGNOSIS — C50212 Malignant neoplasm of upper-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 breast: Secondary | ICD-10-CM

## 2016-04-05 DIAGNOSIS — Z510 Encounter for antineoplastic radiation therapy: Secondary | ICD-10-CM | POA: Diagnosis not present

## 2016-04-05 NOTE — Progress Notes (Signed)
Department of Radiation Oncology  Phone:  [phone] Fax:        [phone]  Weekly Treatment Note    Name: Kristen Howe Date: 04/05/2016 MRN: [id number] DOB: July 11, 1946   Diagnosis:     ICD-9-CM ICD-10-CM   1. Breast cancer of upper-outer quadrant of right female breast ([place]) 174.4 C50.411   2. Primary cancer of 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 breast ([facility]) 174.2 C50.212      Current dose: 21.6 Gy  Current fraction: 12   MEDICATIONS: Current Outpatient Prescriptions  Medication Sig Dispense Refill  . Artificial Tear Ointment (DRY EYES OP) Apply 1 drop to eye daily as needed (for dry eyes).     . Calcium-Phosphorus-Vitamin D (CITRACAL +D3 PO) Take 2 tablets by mouth 2 (two) times daily.     . cyclobenzaprine (FLEXERIL) 10 MG tablet TAKE ONE TABLET BY MOUTH THREE TIMES DAILY AS NEEDED FOR MUSCLE SPASM 30 tablet 0  . gemfibrozil (LOPID) 600 MG tablet Take 1 tablet (600 mg total) by mouth 2 (two) times daily before a meal. 180 tablet 0  . hyaluronate sodium (RADIAPLEXRX) GEL Apply 1 application topically 2 (two) times daily. Apply to breast treated area after rad tx and bedtime    . ibuprofen (ADVIL,MOTRIN) 200 MG tablet Take 400 mg by mouth 2 (two) times daily as needed for moderate pain.    Marland Kitchen losartan-hydrochlorothiazide (HYZAAR) 50-12.5 MG per tablet Take 1 tablet by mouth daily. 90 tablet 1  . Multiple Vitamins-Minerals (MULTIVITAMIN WITH MINERALS) tablet Take 1 tablet by mouth daily. Reported on [id number]    . non-metallic deodorant Jethro Poling) MISC Apply 1 application topically daily as needed.    . pantoprazole (PROTONIX) 40 MG tablet Take 1 tablet (40 mg total) by mouth daily. (Patient taking differently: Take 40 mg by mouth daily as needed (for acid reflux or heartburn). ) 30 tablet 0   No current facility-administered medications for this encounter.     ALLERGIES: Review of patient's allergies indicates no known allergies.   LABORATORY DATA:  Lab Results    Component Value Date   WBC 8.8 02/02/2016   HGB 11.3* 02/02/2016   HCT 34.0* 02/02/2016   MCV 85.2 02/02/2016   PLT 404* 02/02/2016   Lab Results  Component Value Date   NA 140 02/02/2016   K 4.3 02/02/2016   CL 112* 01/26/2016   CO2 23 02/02/2016   Lab Results  Component Value Date   ALT 25 02/02/2016   AST 24 02/02/2016   ALKPHOS 92 02/02/2016   BILITOT <0.30 02/02/2016     NARRATIVE: Kristen Howe was seen today for weekly treatment management. The chart was checked and the patient's films were reviewed.  PAIN: She is currently in no pain.  SKIN: Pt both breasts- positive for Hyperpigmentation, Pruritus, erythema and breast tenderness.  Pt denies edema.  Pt continues to apply Radiaplex as directed. OTHER: Pt complains of fatigue. BP 137/86 mmHg  Pulse 73  Temp(Src) 98.9 F (37.2 C) (Oral)  Resp 12  Wt 160 lb (72.576 kg)  SpO2 97% Wt Readings from Last 3 Encounters:  04/05/16 160 lb (72.576 kg)  03/29/16 160 lb 1.6 oz (72.621 kg)  03/21/16 158 lb 9.6 oz (71.94 kg)     PHYSICAL EXAMINATION: weight is 160 lb (72.576 kg). Her oral temperature is 98.9 F (37.2 C). Her blood pressure is 137/86 and her pulse is 73. Her respiration is 12 and oxygen saturation is 97%.      The patient's  skin shows mild erythema in the upper chest.  ASSESSMENT: The patient is doing satisfactorily with treatment.  PLAN: We will continue with the patient's radiation treatment as planned.

## 2016-04-05 NOTE — Progress Notes (Signed)
PAIN: She is currently in no pain.  SKIN: Pt both breasts- positive for Hyperpigmentation, Pruritus, erythema and breast tenderness.  Pt denies edema.  Pt continues to apply Radiaplex as directed. OTHER: Pt complains of fatigue. BP 137/86 mmHg  Pulse 73  Temp(Src) 98.9 F (37.2 C) (Oral)  Resp 12  Wt 160 lb (72.576 kg)  SpO2 97% Wt Readings from Last 3 Encounters:  04/05/16 160 lb (72.576 kg)  03/29/16 160 lb 1.6 oz (72.621 kg)  03/21/16 158 lb 9.6 oz (71.94 kg)

## 2016-04-08 ENCOUNTER — Ambulatory Visit
Admission: RE | Admit: 2016-04-08 | Discharge: 2016-04-08 | Disposition: A | Discharge status: Home / Self Care | Payer: Medicare Other | Source: Ambulatory Visit | Attending: Radiation Oncology | Admitting: Radiation Oncology

## 2016-04-08 DIAGNOSIS — C50212 Malignant neoplasm of upper-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 breast: Secondary | ICD-10-CM | POA: Diagnosis not present

## 2016-04-08 DIAGNOSIS — C50411 Malignant neoplasm of upper-outer quadrant of right female breast: Secondary | ICD-10-CM | POA: Diagnosis not present

## 2016-04-08 DIAGNOSIS — Z170 Estrogen receptor positive status [ER+]: Secondary | ICD-10-CM | POA: Diagnosis not present

## 2016-04-08 DIAGNOSIS — Z510 Encounter for antineoplastic radiation therapy: Secondary | ICD-10-CM | POA: Diagnosis not present

## 2016-04-09 ENCOUNTER — Ambulatory Visit
Admission: RE | Admit: 2016-04-09 | Discharge: 2016-04-09 | Disposition: A | Discharge status: Home / Self Care | Payer: Medicare Other | Source: Ambulatory Visit | Attending: Radiation Oncology | Admitting: Radiation Oncology

## 2016-04-09 DIAGNOSIS — Z170 Estrogen receptor positive status [ER+]: Secondary | ICD-10-CM | POA: Diagnosis not present

## 2016-04-09 DIAGNOSIS — Z510 Encounter for antineoplastic radiation therapy: Secondary | ICD-10-CM | POA: Diagnosis not present

## 2016-04-09 DIAGNOSIS — C50411 Malignant neoplasm of upper-outer quadrant of right female breast: Secondary | ICD-10-CM | POA: Diagnosis not present

## 2016-04-09 DIAGNOSIS — C50212 Malignant neoplasm of upper-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 breast: Secondary | ICD-10-CM | POA: Diagnosis not present

## 2016-04-10 ENCOUNTER — Ambulatory Visit
Admission: RE | Admit: 2016-04-10 | Discharge: 2016-04-10 | Disposition: A | Discharge status: Home / Self Care | Payer: Medicare Other | Source: Ambulatory Visit | Attending: Radiation Oncology | Admitting: Radiation Oncology

## 2016-04-10 DIAGNOSIS — Z170 Estrogen receptor positive status [ER+]: Secondary | ICD-10-CM | POA: Diagnosis not present

## 2016-04-10 DIAGNOSIS — C50411 Malignant neoplasm of upper-outer quadrant of right female breast: Secondary | ICD-10-CM | POA: Diagnosis not present

## 2016-04-10 DIAGNOSIS — Z510 Encounter for antineoplastic radiation therapy: Secondary | ICD-10-CM | POA: Diagnosis not present

## 2016-04-10 DIAGNOSIS — C50212 Malignant neoplasm of upper-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 breast: Secondary | ICD-10-CM | POA: Diagnosis not present

## 2016-04-11 ENCOUNTER — Ambulatory Visit
Admission: RE | Admit: 2016-04-11 | Discharge: 2016-04-11 | Disposition: A | Discharge status: Home / Self Care | Payer: Medicare Other | Source: Ambulatory Visit | Attending: Radiation Oncology | Admitting: Radiation Oncology

## 2016-04-11 DIAGNOSIS — C50212 Malignant neoplasm of upper-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 breast: Secondary | ICD-10-CM | POA: Diagnosis not present

## 2016-04-11 DIAGNOSIS — Z510 Encounter for antineoplastic radiation therapy: Secondary | ICD-10-CM | POA: Diagnosis not present

## 2016-04-11 DIAGNOSIS — Z170 Estrogen receptor positive status [ER+]: Secondary | ICD-10-CM | POA: Diagnosis not present

## 2016-04-11 DIAGNOSIS — C50411 Malignant neoplasm of upper-outer quadrant of right female breast: Secondary | ICD-10-CM | POA: Diagnosis not present

## 2016-04-12 ENCOUNTER — Ambulatory Visit
Admission: RE | Admit: 2016-04-12 | Discharge: 2016-04-12 | Disposition: A | Discharge status: Home / Self Care | Payer: Medicare Other | Source: Ambulatory Visit | Attending: Radiation Oncology | Admitting: Radiation Oncology

## 2016-04-12 ENCOUNTER — Encounter: Payer: Self-pay | Admitting: Radiation Oncology

## 2016-04-12 VITALS — BP 144/86 | HR 59 | Temp 98.2°F | Resp 16 | Wt 162.2 lb

## 2016-04-12 DIAGNOSIS — Z170 Estrogen receptor positive status [ER+]: Secondary | ICD-10-CM | POA: Diagnosis not present

## 2016-04-12 DIAGNOSIS — C50411 Malignant neoplasm of upper-outer quadrant of right female breast: Secondary | ICD-10-CM | POA: Diagnosis not present

## 2016-04-12 DIAGNOSIS — C50212 Malignant neoplasm of upper-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 breast: Secondary | ICD-10-CM | POA: Insufficient documentation

## 2016-04-12 DIAGNOSIS — C50911 Malignant neoplasm of unspecified site of right female breast: Secondary | ICD-10-CM

## 2016-04-12 DIAGNOSIS — Z923 Personal history of irradiation: Secondary | ICD-10-CM | POA: Insufficient documentation

## 2016-04-12 DIAGNOSIS — Z510 Encounter for antineoplastic radiation therapy: Secondary | ICD-10-CM | POA: Diagnosis not present

## 2016-04-12 DIAGNOSIS — C50912 Malignant neoplasm of unspecified site of left female breast: Secondary | ICD-10-CM

## 2016-04-12 MED ORDER — SONAFINE EX EMUL
1.0000 "application " | Freq: Two times a day (BID) | CUTANEOUS | Status: DC
  Administered 2016-04-12: 1 via TOPICAL
  Filled 2016-04-12: qty 45

## 2016-04-12 NOTE — Progress Notes (Signed)
Department of Radiation Oncology  Phone:  [phone] Fax:        [phone]  Weekly Treatment Note    Name: Kristen Howe Date: 04/12/2016 MRN: [id number] DOB: 15-Jun-1946   Diagnosis:     ICD-9-CM ICD-10-CM   1. Bilateral malignant neoplasm of breast in female, unspecified site of breast ([place]) 174.9 C50.911 SONAFINE emulsion 1 application    0000000   2. Breast cancer of upper-outer quadrant of right female breast ([place]) 174.4 C50.411   3. Primary cancer of 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 breast ([facility]) 174.2 C50.212      Current dose: 30.6 Gy  Current fraction: 17   MEDICATIONS: Current Outpatient Prescriptions  Medication Sig Dispense Refill  . Artificial Tear Ointment (DRY EYES OP) Apply 1 drop to eye daily as needed (for dry eyes).     . Calcium-Phosphorus-Vitamin D (CITRACAL +D3 PO) Take 2 tablets by mouth 2 (two) times daily.     . cyclobenzaprine (FLEXERIL) 10 MG tablet TAKE ONE TABLET BY MOUTH THREE TIMES DAILY AS NEEDED FOR MUSCLE SPASM 30 tablet 0  . gemfibrozil (LOPID) 600 MG tablet Take 1 tablet (600 mg total) by mouth 2 (two) times daily before a meal. 180 tablet 0  . hyaluronate sodium (RADIAPLEXRX) GEL Apply 1 application topically 2 (two) times daily. Apply to breast treated area after rad tx and bedtime    . ibuprofen (ADVIL,MOTRIN) 200 MG tablet Take 400 mg by mouth 2 (two) times daily as needed for moderate pain.    Marland Kitchen losartan-hydrochlorothiazide (HYZAAR) 50-12.5 MG per tablet Take 1 tablet by mouth daily. 90 tablet 1  . Multiple Vitamins-Minerals (MULTIVITAMIN WITH MINERALS) tablet Take 1 tablet by mouth daily. Reported on [id number]    . non-metallic deodorant Jethro Poling) MISC Apply 1 application topically daily as needed.    . pantoprazole (PROTONIX) 40 MG tablet Take 1 tablet (40 mg total) by mouth daily. (Patient taking differently: Take 40 mg by mouth daily as needed (for acid reflux or heartburn). ) 30 tablet 0  . Wound Dressings (SONAFINE) Apply 1  application topically 2 (two) times daily.     Current Facility-Administered Medications  Medication Dose Route Frequency Provider Last Rate Last Dose  . SONAFINE emulsion 1 application  1 application Topical BID Hayden Pedro, PA-C   1 application at [id number]     ALLERGIES: Review of patient's allergies indicates no known allergies.   LABORATORY DATA:  Lab Results  Component Value Date   WBC 8.8 02/02/2016   HGB 11.3* 02/02/2016   HCT 34.0* 02/02/2016   MCV 85.2 02/02/2016   PLT 404* 02/02/2016   Lab Results  Component Value Date   NA 140 02/02/2016   K 4.3 02/02/2016   CL 112* 01/26/2016   CO2 23 02/02/2016   Lab Results  Component Value Date   ALT 25 02/02/2016   AST 24 02/02/2016   ALKPHOS 92 02/02/2016   BILITOT <0.30 02/02/2016     NARRATIVE: Kristen Howe was seen today for weekly treatment management. The chart was checked and the patient's films were reviewed.  Weekly rad txs 17/33 B/L chest wall,breasts, erythema, and dermatitis, c/o itching a lot on front and mild  Itching  on her back , will switch to sonafine cream, has been using radiaplex gel and cortisone cream, appetite good,  Mild pain in chest area  9:49 AM BP 144/86 mmHg  Pulse 59  Temp(Src) 98.2 F (36.8 C) (Oral)  Resp 16  Wt 162 lb  3.2 oz (73.573 kg)  Wt Readings from Last 3 Encounters:  04/12/16 162 lb 3.2 oz (73.573 kg)  04/05/16 160 lb (72.576 kg)  03/29/16 160 lb 1.6 oz (72.621 kg)    PHYSICAL EXAMINATION: weight is 162 lb 3.2 oz (73.573 kg). Her oral temperature is 98.2 F (36.8 C). Her blood pressure is 144/86 and her pulse is 59. Her respiration is 16.      Dermatitis present in the upper aspect of the treatment area.  ASSESSMENT: The patient is doing satisfactorily with treatment.  PLAN: We will continue with the patient's radiation treatment as planned. The patient will change skin cream which is been given to her by nursing. Otherwise she is doing well and we  will continue her treatment.

## 2016-04-12 NOTE — Progress Notes (Signed)
Weekly rad txs 17/33 B/L chest wall,breasts, erythema, and dermatitis, c/o itching a lot on front and mild  Itching  on her back , will switch to sonafine cream, has been using radiaplex gel and cortisone cream, appetite good,  Mild pain in chest area  9:14 AM BP 144/86 mmHg  Pulse 59  Temp(Src) 98.2 F (36.8 C) (Oral)  Resp 16  Wt 162 lb 3.2 oz (73.573 kg)  Wt Readings from Last 3 Encounters:  04/12/16 162 lb 3.2 oz (73.573 kg)  04/05/16 160 lb (72.576 kg)  03/29/16 160 lb 1.6 oz (72.621 kg)

## 2016-04-15 ENCOUNTER — Ambulatory Visit
Admission: RE | Admit: 2016-04-15 | Discharge: 2016-04-15 | Disposition: A | Discharge status: Home / Self Care | Payer: Medicare Other | Source: Ambulatory Visit | Attending: Radiation Oncology | Admitting: Radiation Oncology

## 2016-04-15 ENCOUNTER — Encounter: Payer: Medicare Other | Admitting: Gastroenterology

## 2016-04-15 DIAGNOSIS — Z170 Estrogen receptor positive status [ER+]: Secondary | ICD-10-CM | POA: Diagnosis not present

## 2016-04-15 DIAGNOSIS — C50411 Malignant neoplasm of upper-outer quadrant of right female breast: Secondary | ICD-10-CM | POA: Diagnosis not present

## 2016-04-15 DIAGNOSIS — Z510 Encounter for antineoplastic radiation therapy: Secondary | ICD-10-CM | POA: Diagnosis not present

## 2016-04-15 DIAGNOSIS — C50212 Malignant neoplasm of upper-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 breast: Secondary | ICD-10-CM | POA: Diagnosis not present

## 2016-04-16 ENCOUNTER — Other Ambulatory Visit (HOSPITAL_BASED_OUTPATIENT_CLINIC_OR_DEPARTMENT_OTHER): Payer: Medicare Other

## 2016-04-16 ENCOUNTER — Ambulatory Visit
Admission: RE | Admit: 2016-04-16 | Discharge: 2016-04-16 | Disposition: A | Discharge status: Home / Self Care | Payer: Medicare Other | Source: Ambulatory Visit | Attending: Radiation Oncology | Admitting: Radiation Oncology

## 2016-04-16 ENCOUNTER — Ambulatory Visit (HOSPITAL_BASED_OUTPATIENT_CLINIC_OR_DEPARTMENT_OTHER): Payer: Medicare Other | Admitting: Genetic Counselor

## 2016-04-16 ENCOUNTER — Encounter: Payer: Self-pay | Admitting: Genetic Counselor

## 2016-04-16 DIAGNOSIS — Z315 Encounter for genetic counseling: Secondary | ICD-10-CM | POA: Diagnosis not present

## 2016-04-16 DIAGNOSIS — C50411 Malignant neoplasm of upper-outer quadrant of right female breast: Secondary | ICD-10-CM

## 2016-04-16 DIAGNOSIS — C50412 Malignant neoplasm of upper-outer quadrant of left female breast: Secondary | ICD-10-CM

## 2016-04-16 DIAGNOSIS — Z808 Family history of malignant neoplasm of other organs or systems: Secondary | ICD-10-CM

## 2016-04-16 DIAGNOSIS — C50212 Malignant neoplasm of upper-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 breast: Secondary | ICD-10-CM | POA: Diagnosis not present

## 2016-04-16 DIAGNOSIS — Z720 Tobacco use: Secondary | ICD-10-CM

## 2016-04-16 DIAGNOSIS — Z510 Encounter for antineoplastic radiation therapy: Secondary | ICD-10-CM | POA: Diagnosis not present

## 2016-04-16 DIAGNOSIS — Z801 Family history of malignant neoplasm of trachea, bronchus and lung: Secondary | ICD-10-CM

## 2016-04-16 DIAGNOSIS — C773 Secondary and unspecified malignant neoplasm of axilla and upper limb lymph nodes: Secondary | ICD-10-CM | POA: Diagnosis not present

## 2016-04-16 DIAGNOSIS — Z803 Family history of malignant neoplasm of breast: Secondary | ICD-10-CM

## 2016-04-16 DIAGNOSIS — Z807 Family history of other malignant neoplasms of lymphoid, hematopoietic and related tissues: Secondary | ICD-10-CM

## 2016-04-16 DIAGNOSIS — Z170 Estrogen receptor positive status [ER+]: Secondary | ICD-10-CM | POA: Diagnosis not present

## 2016-04-16 DIAGNOSIS — C50112 Malignant neoplasm of central portion of left female breast: Secondary | ICD-10-CM | POA: Diagnosis not present

## 2016-04-16 DIAGNOSIS — Z800 Family history of malignant neoplasm of digestive organs: Secondary | ICD-10-CM

## 2016-04-16 LAB — CBC WITH DIFFERENTIAL/PLATELET
BASO%: 0.6 % (ref 0.0–2.0)
BASOS ABS: 0 10*3/uL (ref 0.0–0.1)
EOS%: 3.4 % (ref 0.0–7.0)
Eosinophils Absolute: 0.2 10*3/uL (ref 0.0–0.5)
HEMATOCRIT: 40.9 % (ref 34.8–46.6)
HEMOGLOBIN: 13.3 g/dL (ref 11.6–15.9)
LYMPH#: 0.7 10*3/uL — AB (ref 0.9–3.3)
LYMPH%: 12 % — ABNORMAL LOW (ref 14.0–49.7)
MCH: 27.4 pg (ref 25.1–34.0)
MCHC: 32.4 g/dL (ref 31.5–36.0)
MCV: 84.5 fL (ref 79.5–101.0)
MONO#: 0.6 10*3/uL (ref 0.1–0.9)
MONO%: 10 % (ref 0.0–14.0)
NEUT#: 4.3 10*3/uL (ref 1.5–6.5)
NEUT%: 74 % (ref 38.4–76.8)
Platelets: 288 10*3/uL (ref 145–400)
RBC: 4.84 10*6/uL (ref 3.70–5.45)
RDW: 14.9 % — AB (ref 11.2–14.5)
WBC: 5.8 10*3/uL (ref 3.9–10.3)

## 2016-04-16 LAB — COMPREHENSIVE METABOLIC PANEL
ALBUMIN: 4 g/dL (ref 3.5–5.0)
ALK PHOS: 85 U/L (ref 40–150)
ALT: 16 U/L (ref 0–55)
AST: 15 U/L (ref 5–34)
Anion Gap: 9 mEq/L (ref 3–11)
BUN: 18 mg/dL (ref 7.0–26.0)
CALCIUM: 10 mg/dL (ref 8.4–10.4)
CO2: 22 mEq/L (ref 22–29)
Chloride: 109 mEq/L (ref 98–109)
Creatinine: 0.8 mg/dL (ref 0.6–1.1)
EGFR: 75 mL/min/{1.73_m2} — AB (ref >90)
Glucose: 87 mg/dl (ref 70–140)
POTASSIUM: 4.2 meq/L (ref 3.5–5.1)
Sodium: 140 mEq/L (ref 136–145)
Total Bilirubin: <0.3 mg/dL (ref 0.20–1.20)
Total Protein: 7.7 g/dL (ref 6.4–8.3)

## 2016-04-17 ENCOUNTER — Encounter: Payer: Self-pay | Admitting: Genetic Counselor

## 2016-04-17 ENCOUNTER — Encounter: Payer: Self-pay | Admitting: Radiation Oncology

## 2016-04-17 ENCOUNTER — Ambulatory Visit
Admission: RE | Admit: 2016-04-17 | Discharge: 2016-04-17 | Disposition: A | Discharge status: Home / Self Care | Payer: Medicare Other | Source: Ambulatory Visit | Attending: Radiation Oncology | Admitting: Radiation Oncology

## 2016-04-17 DIAGNOSIS — C50212 Malignant neoplasm of upper-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 breast: Secondary | ICD-10-CM | POA: Diagnosis not present

## 2016-04-17 DIAGNOSIS — Z803 Family history of malignant neoplasm of breast: Secondary | ICD-10-CM | POA: Insufficient documentation

## 2016-04-17 DIAGNOSIS — Z170 Estrogen receptor positive status [ER+]: Secondary | ICD-10-CM | POA: Diagnosis not present

## 2016-04-17 DIAGNOSIS — Z510 Encounter for antineoplastic radiation therapy: Secondary | ICD-10-CM | POA: Diagnosis not present

## 2016-04-17 DIAGNOSIS — C50411 Malignant neoplasm of upper-outer quadrant of right female breast: Secondary | ICD-10-CM | POA: Diagnosis not present

## 2016-04-17 NOTE — Progress Notes (Signed)
REFERRING PROVIDER: Hayden Pedro, FNP  OTHER PROVIDER(S): Lurline Del, MD  PRIMARY PROVIDER:  Mauricio Po, FNP  PRIMARY REASON FOR VISIT:  1. Primary cancer of 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 breast ([place])   2. Breast cancer of upper-outer quadrant of right female breast ([place])   3. Family history of breast cancer in sister   9. Family history of lymphoma   5. Family history of lung cancer   6. Family history of cancer of spinal cord   7. Family history of oral cancer      HISTORY OF PRESENT ILLNESS:   Kristen Howe, a 70 y.o. female, was seen for a [place] cancer genetics consultation at the request of Shona Simpson, FNP due to a personal history bilateral breast cancer and family history of breast and other cancers.  Kristen Howe presents to clinic today with her twin sister, Kristen Howe, to discuss the possibility of a hereditary predisposition to cancer, genetic testing, and to further clarify her future cancer risks, as well as potential cancer risks for family members.   In December 2016, at the age of 6, Kristen Howe was diagnosed with invasive ductal carcinoma with DCIS of both the right and left breasts.  Hormone receptors were ER/PR+, and Her2- for both tumors.  This was treated with bilateral mastectomies.   HORMONAL RISK FACTORS:  Menarche was at age 61.  First live birth at age 56.  OCP use for approximately 3 years.  Ovaries intact: yes.  Hysterectomy: no.  Menopausal status: postmenopausal.  HRT use: 0 years. Colonoscopy: no; not examined. Mammogram within the last year: no. Number of breast biopsies: 2. Up to date with pelvic exams:  no. Any excessive radiation exposure in the past:  Previously worked around radiation, but only for three days; additional history of secondhand smoke exposure.  Past Medical History  Diagnosis Date  . Hypertension   . Hyperlipidemia   . GERD (gastroesophageal reflux disease)   . Leg cramps     takes  Flexeril  . Anemia     as a child  . Primary cancer of 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 breast ([place]) 12/14/2015  . Bilateral breast cancer ([place])     Kristen Howe 01/25/2016  . Rheumatic fever 1950s  . Pneumonia ~ 2005 X 1  . Migraine     "maybe monthly" (01/25/2016)  . Arthritis     "lower back" (01/25/2016)    Past Surgical History  Procedure Laterality Date  . Cesarean section  1974; 1976  . Umbilical hernia repair N/A 09/12/2015    Procedure: REPAIR OF INCISIONAL AND UMBILICAL HERNIA;  Surgeon: Erroll Luna, MD;  Location: [place] [facility];  Service: General;  Laterality: N/A;  . Insertion of mesh N/A 09/12/2015    Procedure: INSERTION OF MESH;  Surgeon: Erroll Luna, MD;  Location: [facility];  Service: General;  Laterality: N/A;  . Mastectomy modified radical Left 01/25/2016  . Mastectomy complete / simple w/ sentinel node biopsy Right 01/25/2016  . Hernia repair    . Tonsillectomy  1950s  . Breast biopsy Bilateral 2016  . Cataract extraction w/ intraocular lens  implant, bilateral Bilateral 05/2015-06/2015  . Eye muscle surgery Bilateral 1950s    for cross eyes as a child  . Mastectomy w/ sentinel node biopsy Bilateral 01/25/2016    Procedure: RIGHT MASTECTOMY WITH SENTINEL RIGHT LYMPH NODE BIOPSY, LEFT MODIFIED RADICAL MASTECTOMY;  Surgeon: Stark Klein, MD;  Location: [facility];  Service: General;  Laterality: Bilateral;  Social History   Social History  . Marital Status: Divorced    Spouse Name: N/A  . Number of Children: 2  . Years of Education: 12   Occupational History  . Unemployed    Social History Main Topics  . Smoking status: Former Smoker -- 1.00 packs/day for 55 years    Types: Cigarettes    Quit date: 12/24/2015  . Smokeless tobacco: Never Used  . Alcohol Use: 0.0 oz/week    0 Standard drinks or equivalent per week     Comment: 04/16/2016 "1/2 bottle wine per month" previously - not much now  . Drug Use: No  . Sexual Activity: No   Other Topics Concern  . None   Social  History Narrative   Fun: Internet; games, talk to people   Denies religious beliefs effecting health care.   Denies abuse and feels safe at home.      FAMILY HISTORY:  We obtained a detailed, 4-generation family history.  Significant diagnoses are listed below: Family History  Problem Relation Age of Onset  . Healthy Mother   . Other Mother     +"throat polyps"; +smoker  . Lymphoma Father     d. 62  . Diabetes Father   . Heart Problems Father   . Other Father     "tumors on his kidneys"  . Breast cancer Sister     dx. metachronous bilateral breast cancers at 81 and 64; - BRCA1/2 testing  . Cancer Maternal Grandmother 2    dx. spinal cancer  . Stroke Maternal Grandfather   . Gout Paternal Grandmother   . Gout Paternal Grandfather   . Lung cancer Maternal Uncle     d. 70-72; +smoker  . Diabetes Paternal Aunt   . Diabetes Paternal Uncle   . Breast cancer Cousin     maternal 1st cousin dx. unspecified age  . Lung cancer Cousin     maternal 1st cousin dx. lung cancer; +smoker  . Lupus Cousin   . Breast cancer Cousin     paternal 1st cousin dx. 61s  . Breast cancer Cousin     paternal 1st cousin dx. 17s-30s  . Cancer Cousin     paternal 1st cousin dx. oral cancer with metastasis, also "lost a leg to cancer"; dx. 36s; +EtOH abuse  . Breast cancer Cousin     paternal 1st cousin, once-removed dx. at unspecified age    Kristen Howe has one son who is 34 and one daughter who is 18.  She has two granddaughters and one grandson.  Neither of her children or any of her grandchildren have ever been diagnosed with cancer.  Kristen Howe has an identical twin sister, Kristen Howe, who has also been diagnosed with bilateral breast cancers--diagnosed at ages 48 and 53.  She reportedly had negative genetic testing in 2012 (likely only [facility]).  Kristen Howe had one full brother who died in a car accident at the age of 34-40.  Kristen Howe mother is currently 45 and has not had cancer.   She is a smoker and has a history of throat polyps.  Kristen Howe's father died of lymphoma at the age of 53.  He also had a history of diabetes, heart problems, and "tumors on his kidneys".    Kristen Howe's mother has one full brother and sister.  Her brother was a smoker and died of lung cancer in his early 33s.  He had four daughters and one son.  One of his daughters  died of lupus at a young age.  Another daughter has a history of breast cancer at an unspecified age and another has a history of lung cancer.  Kristen Howe's maternal aunt is currently 60 and has not had cancer.  She has three daughters for whom Kristen Howe has limited information.  Kristen Howe's maternal grandmother died of "spinal cancer" at the age of 70.  Kristen Howe's maternal grandfather died of a stroke at the age of 39.  Kristen Howe and her sister had no information for their maternal great aunts/uncles and great grandparents.   Ms. Hackenberg father had approximately 44 full siblings.  Approximately 7 of those siblings were sisters.  All of his siblings have passed away, most of whom died of diabetes-related complications at later ages.  Ms. Koenig and her sister reported no known cancer history for their paternal aunts and uncles.  Two paternal first cousins were diagnosed with breast cancer in their 39s or 12s.  Another paternal first cousin was diagnosed with an oral cancer in his 64s and he lost a leg due to this cancer and passed away in his 76s.  A paternal first cousin, once-removed was also diagnosed with breast cancer but an an unspecified age.  Ms. Och's sister reports that their paternal grandparents both died of gout-related problems, but they are unsure at what ages and they have no further information for them or for any other paternal great aunts/uncles or great grandparents.    Ms. Wojahn reports no other known family history of genetic testing for hereditary cancer.  Patient's maternal ancestors are of  Sc[place]ch-Irish descent, and paternal ancestors are of German/Dutch descent. There is no reported Ashkenazi Jewish ancestry. There is no known consanguinity.  GENETIC COUNSELING ASSESSMENT: Kristen Howe is a 70 y.o. female with a personal and family history of breast cancer which is somewhat suggestive of a hereditary breast cancer syndrome and predisposition to cancer. We, therefore, discussed and recommended the following at today's visit.   DISCUSSION: We reviewed the characteristics, features and inheritance patterns of hereditary cancer syndromes, particularly those caused by mutations within the BRCA1/2 and other breast cancer-related genes that have been added to testing options since 2012. We also discussed genetic testing, including the appropriate family members to test, the process of testing, insurance coverage and turn-around-time for results. We discussed the implications of a negative, positive and/or variant of uncertain significant result. We recommended Ms. Hogue pursue genetic testing for the 20-gene Breast/Ovarian Cancer Panel through [facility].  The Breast/Ovarian Cancer Panel offered by [facility] Hope Pigeon, MD) includes sequencing and deletion/duplication analysis for the following 19 genes:  ATM, BARD1, BRCA1, BRCA2, BRIP1, CDH1, CHEK2, FANCC, MLH1, MSH2, MSH6, NBN, PALB2, PMS2, PTEN, RAD51C, RAD51D, TP53, and XRCC2.  This panel also includes deletion/duplication analysis (without sequencing) for one gene, EPCAM.   Based on Ms. Fossett's personal and family history of cancer, she meets medical criteria for genetic testing. Despite that she meets criteria, she may still have an out of pocket cost. We discussed that if her out of pocket cost for testing is over $100, the laboratory will call and confirm whether she wants to proceed with testing.  If the out of pocket cost of testing is less than $100 she will be billed by the genetic testing laboratory.   PLAN:  After considering the risks, benefits, and limitations, Ms. Yandell  provided informed consent to pursue genetic testing and the blood sample was sent to [facility] for analysis of the  20-gene Breast/Ovarian Cancer Panel test. Results should be available within approximately 2-3 weeks' time, at which point they will be disclosed by telephone to Ms. Veenstra, as will any additional recommendations warranted by these results. Ms. Bricco will receive a summary of her genetic counseling visit and a copy of her results once available. This information will also be available in Epic. We encouraged Ms. Dunckel to remain in contact with cancer genetics annually so that we can continuously update the family history and inform her of any changes in cancer genetics and testing that may be of benefit for her family. Ms. Bansal's questions were answered to her satisfaction today. Our contact information was provided should additional questions or concerns arise.  Thank you for the referral and allowing [place] to share in the care of your patient.   Jeanine Luz, MS, [facility] Certified Genetic Counselor [email or web address] Phone: [phone]  The patient was seen for a total of 60 minutes in face-to-face genetic counseling.  This patient was discussed with Drs. Magrinat, Lindi Adie and/or Burr Medico who agrees with the above.    _______________________________________________________________________ For Office Staff:  Number of people involved in session: 2 Was an Intern/ student involved with case: no

## 2016-04-18 ENCOUNTER — Ambulatory Visit
Admission: RE | Admit: 2016-04-18 | Discharge: 2016-04-18 | Disposition: A | Discharge status: Home / Self Care | Payer: Medicare Other | Source: Ambulatory Visit | Attending: Radiation Oncology | Admitting: Radiation Oncology

## 2016-04-18 DIAGNOSIS — Z170 Estrogen receptor positive status [ER+]: Secondary | ICD-10-CM | POA: Diagnosis not present

## 2016-04-18 DIAGNOSIS — C50212 Malignant neoplasm of upper-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 breast: Secondary | ICD-10-CM | POA: Diagnosis not present

## 2016-04-18 DIAGNOSIS — Z510 Encounter for antineoplastic radiation therapy: Secondary | ICD-10-CM | POA: Diagnosis not present

## 2016-04-18 DIAGNOSIS — C50411 Malignant neoplasm of upper-outer quadrant of right female breast: Secondary | ICD-10-CM | POA: Diagnosis not present

## 2016-04-19 ENCOUNTER — Ambulatory Visit
Admission: RE | Admit: 2016-04-19 | Discharge: 2016-04-19 | Disposition: A | Discharge status: Home / Self Care | Payer: Medicare Other | Source: Ambulatory Visit | Attending: Radiation Oncology | Admitting: Radiation Oncology

## 2016-04-19 DIAGNOSIS — C50212 Malignant neoplasm of upper-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 breast: Secondary | ICD-10-CM | POA: Diagnosis not present

## 2016-04-19 DIAGNOSIS — C50411 Malignant neoplasm of upper-outer quadrant of right female breast: Secondary | ICD-10-CM

## 2016-04-19 DIAGNOSIS — Z170 Estrogen receptor positive status [ER+]: Secondary | ICD-10-CM | POA: Diagnosis not present

## 2016-04-19 DIAGNOSIS — Z510 Encounter for antineoplastic radiation therapy: Secondary | ICD-10-CM | POA: Diagnosis not present

## 2016-04-19 NOTE — Progress Notes (Signed)
MD saw the pataient on the Lianc table in the back for her treatment,not sent to nursing for assessment 9:18 AM

## 2016-04-19 NOTE — Progress Notes (Signed)
   Department of Radiation Oncology  Phone:  [phone] Fax:        [phone]  Weekly Treatment Note    Name: Kristen Howe Date: 04/19/2016 MRN: [id number] DOB: 07-Sep-1946   Diagnosis:     ICD-9-CM ICD-10-CM   1. Breast cancer of upper-outer quadrant of right female breast ([place]) 174.4 C50.411      Current dose: 39.6 Gy  Current fraction: 22   MEDICATIONS: Current Outpatient Prescriptions  Medication Sig Dispense Refill  . Artificial Tear Ointment (DRY EYES OP) Apply 1 drop to eye daily as needed (for dry eyes).     . Calcium-Phosphorus-Vitamin D (CITRACAL +D3 PO) Take 2 tablets by mouth 2 (two) times daily.     . cyclobenzaprine (FLEXERIL) 10 MG tablet TAKE ONE TABLET BY MOUTH THREE TIMES DAILY AS NEEDED FOR MUSCLE SPASM 30 tablet 0  . gemfibrozil (LOPID) 600 MG tablet Take 1 tablet (600 mg total) by mouth 2 (two) times daily before a meal. 180 tablet 0  . hyaluronate sodium (RADIAPLEXRX) GEL Apply 1 application topically 2 (two) times daily. Apply to breast treated area after rad tx and bedtime    . ibuprofen (ADVIL,MOTRIN) 200 MG tablet Take 400 mg by mouth 2 (two) times daily as needed for moderate pain.    Marland Kitchen losartan-hydrochlorothiazide (HYZAAR) 50-12.5 MG per tablet Take 1 tablet by mouth daily. 90 tablet 1  . Multiple Vitamins-Minerals (MULTIVITAMIN WITH MINERALS) tablet Take 1 tablet by mouth daily. Reported on [id number]    . non-metallic deodorant Jethro Poling) MISC Apply 1 application topically daily as needed.    . pantoprazole (PROTONIX) 40 MG tablet Take 1 tablet (40 mg total) by mouth daily. (Patient taking differently: Take 40 mg by mouth daily as needed (for acid reflux or heartburn). ) 30 tablet 0  . Wound Dressings (SONAFINE) Apply 1 application topically 2 (two) times daily.     No current facility-administered medications for this encounter.     ALLERGIES: Review of patient's allergies indicates no known allergies.   LABORATORY DATA:  Lab Results    Component Value Date   WBC 5.8 04/16/2016   HGB 13.3 04/16/2016   HCT 40.9 04/16/2016   MCV 84.5 04/16/2016   PLT 288 04/16/2016   Lab Results  Component Value Date   NA 140 04/16/2016   K 4.2 04/16/2016   CL 112* 01/26/2016   CO2 22 04/16/2016   Lab Results  Component Value Date   ALT 16 04/16/2016   AST 15 04/16/2016   ALKPHOS 85 04/16/2016   BILITOT <0.30 04/16/2016     NARRATIVE: Kristen Howe was seen today for weekly treatment management. The chart was checked and the patient's films were reviewed.  The patient states that she is doing relatively well. Has continued with her skin care regimen without any changes  PHYSICAL EXAMINATION: vitals were not taken for this visit.     Diffuse erythema/dermatitis. No moist desquamation on either side.  ASSESSMENT: The patient is doing satisfactorily with treatment.  PLAN: We will continue with the patient's radiation treatment as planned.

## 2016-04-22 ENCOUNTER — Ambulatory Visit
Admission: RE | Admit: 2016-04-22 | Discharge: 2016-04-22 | Disposition: A | Discharge status: Home / Self Care | Payer: Medicare Other | Source: Ambulatory Visit | Attending: Radiation Oncology | Admitting: Radiation Oncology

## 2016-04-22 DIAGNOSIS — C50212 Malignant neoplasm of upper-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 breast: Secondary | ICD-10-CM | POA: Diagnosis not present

## 2016-04-22 DIAGNOSIS — Z510 Encounter for antineoplastic radiation therapy: Secondary | ICD-10-CM | POA: Diagnosis not present

## 2016-04-22 DIAGNOSIS — Z170 Estrogen receptor positive status [ER+]: Secondary | ICD-10-CM | POA: Diagnosis not present

## 2016-04-22 DIAGNOSIS — C50411 Malignant neoplasm of upper-outer quadrant of right female breast: Secondary | ICD-10-CM | POA: Diagnosis not present

## 2016-04-23 ENCOUNTER — Ambulatory Visit
Admission: RE | Admit: 2016-04-23 | Discharge: 2016-04-23 | Disposition: A | Discharge status: Home / Self Care | Payer: Medicare Other | Source: Ambulatory Visit | Attending: Radiation Oncology | Admitting: Radiation Oncology

## 2016-04-23 DIAGNOSIS — Z170 Estrogen receptor positive status [ER+]: Secondary | ICD-10-CM | POA: Diagnosis not present

## 2016-04-23 DIAGNOSIS — Z510 Encounter for antineoplastic radiation therapy: Secondary | ICD-10-CM | POA: Diagnosis not present

## 2016-04-23 DIAGNOSIS — C50411 Malignant neoplasm of upper-outer quadrant of right female breast: Secondary | ICD-10-CM | POA: Diagnosis not present

## 2016-04-23 DIAGNOSIS — C50212 Malignant neoplasm of upper-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 breast: Secondary | ICD-10-CM | POA: Diagnosis not present

## 2016-04-24 ENCOUNTER — Encounter: Payer: Self-pay | Admitting: Radiation Oncology

## 2016-04-24 ENCOUNTER — Ambulatory Visit
Admission: RE | Admit: 2016-04-24 | Discharge: 2016-04-24 | Disposition: A | Discharge status: Home / Self Care | Payer: Medicare Other | Source: Ambulatory Visit | Attending: Radiation Oncology | Admitting: Radiation Oncology

## 2016-04-24 DIAGNOSIS — C50411 Malignant neoplasm of upper-outer quadrant of right female breast: Secondary | ICD-10-CM | POA: Diagnosis not present

## 2016-04-24 DIAGNOSIS — Z170 Estrogen receptor positive status [ER+]: Secondary | ICD-10-CM | POA: Diagnosis not present

## 2016-04-24 DIAGNOSIS — C50212 Malignant neoplasm of upper-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 breast: Secondary | ICD-10-CM | POA: Diagnosis not present

## 2016-04-24 DIAGNOSIS — Z510 Encounter for antineoplastic radiation therapy: Secondary | ICD-10-CM | POA: Diagnosis not present

## 2016-04-25 ENCOUNTER — Ambulatory Visit
Admission: RE | Admit: 2016-04-25 | Discharge: 2016-04-25 | Disposition: A | Discharge status: Home / Self Care | Payer: Medicare Other | Source: Ambulatory Visit | Attending: Radiation Oncology | Admitting: Radiation Oncology

## 2016-04-25 DIAGNOSIS — C50411 Malignant neoplasm of upper-outer quadrant of right female breast: Secondary | ICD-10-CM | POA: Diagnosis not present

## 2016-04-25 DIAGNOSIS — Z170 Estrogen receptor positive status [ER+]: Secondary | ICD-10-CM | POA: Diagnosis not present

## 2016-04-25 DIAGNOSIS — C50212 Malignant neoplasm of upper-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 breast: Secondary | ICD-10-CM | POA: Diagnosis not present

## 2016-04-25 DIAGNOSIS — Z510 Encounter for antineoplastic radiation therapy: Secondary | ICD-10-CM | POA: Diagnosis not present

## 2016-04-26 ENCOUNTER — Ambulatory Visit
Admission: RE | Admit: 2016-04-26 | Discharge: 2016-04-26 | Disposition: A | Discharge status: Home / Self Care | Payer: Medicare Other | Source: Ambulatory Visit | Attending: Radiation Oncology | Admitting: Radiation Oncology

## 2016-04-26 DIAGNOSIS — Z170 Estrogen receptor positive status [ER+]: Secondary | ICD-10-CM | POA: Diagnosis not present

## 2016-04-26 DIAGNOSIS — C50411 Malignant neoplasm of upper-outer quadrant of right female breast: Secondary | ICD-10-CM

## 2016-04-26 DIAGNOSIS — Z510 Encounter for antineoplastic radiation therapy: Secondary | ICD-10-CM | POA: Diagnosis not present

## 2016-04-26 DIAGNOSIS — C50212 Malignant neoplasm of upper-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 breast: Secondary | ICD-10-CM | POA: Diagnosis not present

## 2016-04-26 MED ORDER — SONAFINE EX EMUL
1.0000 "application " | Freq: Once | CUTANEOUS | Status: AC
  Administered 2016-04-26: 1 via TOPICAL
  Filled 2016-04-26: qty 45

## 2016-04-26 NOTE — Progress Notes (Signed)
   Department of Radiation Oncology  Phone:  [phone] Fax:        [phone]  Weekly Treatment Note    Name: Kristen Howe Date: 04/26/2016 MRN: [id number] DOB: 13-Sep-1946   Diagnosis:     ICD-9-CM ICD-10-CM   1. Breast cancer of upper-outer quadrant of right female breast ([place]) 174.4 C50.411      Current dose: 48.6 Gy  Current fraction: 27   MEDICATIONS: Current Outpatient Prescriptions  Medication Sig Dispense Refill  . Artificial Tear Ointment (DRY EYES OP) Apply 1 drop to eye daily as needed (for dry eyes).     . Calcium-Phosphorus-Vitamin D (CITRACAL +D3 PO) Take 2 tablets by mouth 2 (two) times daily.     . cyclobenzaprine (FLEXERIL) 10 MG tablet TAKE ONE TABLET BY MOUTH THREE TIMES DAILY AS NEEDED FOR MUSCLE SPASM 30 tablet 0  . gemfibrozil (LOPID) 600 MG tablet Take 1 tablet (600 mg total) by mouth 2 (two) times daily before a meal. 180 tablet 0  . hyaluronate sodium (RADIAPLEXRX) GEL Apply 1 application topically 2 (two) times daily. Apply to breast treated area after rad tx and bedtime    . ibuprofen (ADVIL,MOTRIN) 200 MG tablet Take 400 mg by mouth 2 (two) times daily as needed for moderate pain.    Marland Kitchen losartan-hydrochlorothiazide (HYZAAR) 50-12.5 MG per tablet Take 1 tablet by mouth daily. 90 tablet 1  . Multiple Vitamins-Minerals (MULTIVITAMIN WITH MINERALS) tablet Take 1 tablet by mouth daily. Reported on [id number]    . non-metallic deodorant Jethro Poling) MISC Apply 1 application topically daily as needed.    . pantoprazole (PROTONIX) 40 MG tablet Take 1 tablet (40 mg total) by mouth daily. (Patient taking differently: Take 40 mg by mouth daily as needed (for acid reflux or heartburn). ) 30 tablet 0  . Wound Dressings (SONAFINE) Apply 1 application topically 2 (two) times daily.     No current facility-administered medications for this encounter.     ALLERGIES: Review of patient's allergies indicates no known allergies.   LABORATORY DATA:  Lab Results    Component Value Date   WBC 5.8 04/16/2016   HGB 13.3 04/16/2016   HCT 40.9 04/16/2016   MCV 84.5 04/16/2016   PLT 288 04/16/2016   Lab Results  Component Value Date   NA 140 04/16/2016   K 4.2 04/16/2016   CL 112* 01/26/2016   CO2 22 04/16/2016   Lab Results  Component Value Date   ALT 16 04/16/2016   AST 15 04/16/2016   ALKPHOS 85 04/16/2016   BILITOT <0.30 04/16/2016     NARRATIVE: Kristen Howe was seen today for weekly treatment management. The chart was checked and the patient's films were reviewed.   The patient states that she is doing well. Some increased skin irritation, especially in the supraclavicular region.    PHYSICAL EXAMINATION: vitals were not taken for this visit.     No moist desquamation present. Increased hyperpigmentation in the supraclavicular region greater on the left.  ASSESSMENT: The patient is doing satisfactorily with treatment.  PLAN: We will continue with the patient's radiation treatment as planned.

## 2016-04-29 ENCOUNTER — Ambulatory Visit
Admission: RE | Admit: 2016-04-29 | Discharge: 2016-04-29 | Disposition: A | Discharge status: Home / Self Care | Payer: Medicare Other | Source: Ambulatory Visit | Attending: Radiation Oncology | Admitting: Radiation Oncology

## 2016-04-29 ENCOUNTER — Ambulatory Visit: Payer: Self-pay | Admitting: Genetic Counselor

## 2016-04-29 ENCOUNTER — Telehealth: Payer: Self-pay | Admitting: Genetic Counselor

## 2016-04-29 DIAGNOSIS — Z510 Encounter for antineoplastic radiation therapy: Secondary | ICD-10-CM | POA: Diagnosis not present

## 2016-04-29 DIAGNOSIS — C50212 Malignant neoplasm of upper-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 breast: Secondary | ICD-10-CM

## 2016-04-29 DIAGNOSIS — C50411 Malignant neoplasm of upper-outer quadrant of right female breast: Secondary | ICD-10-CM | POA: Diagnosis not present

## 2016-04-29 DIAGNOSIS — Z803 Family history of malignant neoplasm of breast: Secondary | ICD-10-CM

## 2016-04-29 DIAGNOSIS — Z809 Family history of malignant neoplasm, unspecified: Secondary | ICD-10-CM

## 2016-04-29 DIAGNOSIS — Z170 Estrogen receptor positive status [ER+]: Secondary | ICD-10-CM | POA: Diagnosis not present

## 2016-04-29 DIAGNOSIS — Z1379 Encounter for other screening for genetic and chromosomal anomalies: Secondary | ICD-10-CM

## 2016-04-29 NOTE — Telephone Encounter (Signed)
Discussed with Kristen Howe that her genetic test results were negative for pathogenic mutations within any of 20 genes on the Breast/Ovarian Cancer Panel.  No uncertain changes were found.  Discussed that, while we cannot rule out a genetic cause, that most likely this is a reassuring result.  Genetic testing is available to her twin sister, although, being as she is an identical twin, we would expect to have the same test result.  Other cousins with breast cancer diagnoses at earlier ages 8 also benefit from genetic testing.  Encouraged her to continue to follow her doctors' cancer screening recommendations.  Her daughter and nieces should be sure to continue annual mammograms, annual clinical breast exams, and self breast exam screening.  Kristen Howe is welcome to call with any questions.

## 2016-04-29 NOTE — Progress Notes (Signed)
GENETIC TEST RESULT  HPI: Kristen Howe was previously seen in the [facility] due to a personal history of bilateral breast cancer, family history of breast cancer, and concerns regarding a hereditary predisposition to cancer. Please refer to our prior cancer genetics clinic note from Apr 16, 2016 for more information regarding Kristen Howe's medical, social and family histories, and our assessment and recommendations, at the time. Kristen Howe recent genetic test results were disclosed to her, as were recommendations warranted by these results. These results and recommendations are discussed in more detail below.  GENETIC TEST RESULTS: At the time of Kristen Howe's visit on 04/16/16, we recommended she pursue genetic testing of the 20-gene Breast/Ovarian Cancer Panel through [facility].  The Breast/Ovarian Cancer Panel offered by [facility] Hope Pigeon, MD) includes sequencing and deletion/duplication analysis for the following 19 genes:  ATM, BARD1, BRCA1, BRCA2, BRIP1, CDH1, CHEK2, FANCC, MLH1, MSH2, MSH6, NBN, PALB2, PMS2, PTEN, RAD51C, RAD51D, TP53, and XRCC2.  This panel also includes deletion/duplication analysis (without sequencing) for one gene, EPCAM.  Those results are now back, the report date for which is Apr 26, 2016.  Genetic testing was normal, and did not reveal a deleterious mutation in these genes.  Additionally, no variants of uncertain significance (VUSes) were found.  The test report will be scanned into EPIC and will be located under the Results Review tab in the Pathology>Molecular Pathology section.   We discussed with Kristen Howe that since the current genetic testing is not perfect, it is possible there may be a gene mutation in one of these genes that current testing cannot detect, but that chance is small. We also discussed, that it is possible that another gene that has not yet been discovered, or that we have not yet tested, is  responsible for the cancer diagnoses in the family, and it is, therefore, important to remain in touch with cancer genetics in the future so that we can continue to offer Kristen Howe the most up to date genetic testing.   CANCER SCREENING RECOMMENDATIONS: While we still do not have an explanation for the personal and family history of breast cancer, this result is reassuring and indicates that Kristen Howe likely does not have an increased risk for a future cancer due to a mutation in one of these genes. This normal test also suggests that Kristen Howe's cancer was most likely not due to an inherited predisposition associated with one of these genes.  Additionally, many of Kristen Howe's relatives have lived to later ages and have never had cancer; all of the breast cancer in the family beyond that for her and her sister is found in cousins.  Most cancers happen by chance and this negative test suggests that her cancer falls into this category.  We, therefore, recommended she continue to follow the cancer management and screening guidelines provided by her oncology and primary healthcare providers.   RECOMMENDATIONS FOR FAMILY MEMBERS: Women in this family might be at some increased risk of developing cancer, over the general population risk, simply due to the family history of cancer. We recommended women in this family have a yearly mammogram beginning at age 87, or 35 years younger than the earliest onset of cancer, an an annual clinical breast exam, and perform monthly breast self-exams.  Kristen Howe's daughter and her niece should continue to have annual breast cancer screening.  Women in this family should also have a gynecological exam as recommended by their primary provider. All family  members should have a colonoscopy by age 8.  Kristen Howe's twin sister is likely eligible for updated genetic testing.  We discussed that this is available to her, especially based on her history of bilateral  breast cancer, although she is presumed to be Kristen Howe's identical twin sister, and, thus, we would expect for her to also have a negative test result.  Kristen Howe's paternal first cousin who was diagnosed with breast cancer in her 20s-30s would also be eligible for testing, if interested.  Kristen Howe will let us know if we can be of any assistance in coordinating genetic counseling and/or testing for these family members.  Family members can also use the [employer] website '[email or web address]' to find a cancer genetic counselor in their local area.   FOLLOW-UP: Lastly, we discussed with Kristen Howe that cancer genetics is a rapidly advancing field and it is possible that new genetic tests will be appropriate for her and/or her family members in the future. We encouraged her to remain in contact with cancer genetics on an annual basis so we can update her personal and family histories and let her know of advances in cancer genetics that may benefit this family.   Our contact number was provided. Kristen Howe's questions were answered to her satisfaction, and she knows she is welcome to call us at anytime with additional questions or concerns.   Jeanine Luz, MS, [facility] Certified Genetic Counselor [email or web address] Phone: [phone]

## 2016-04-30 ENCOUNTER — Ambulatory Visit
Admission: RE | Admit: 2016-04-30 | Discharge: 2016-04-30 | Disposition: A | Discharge status: Home / Self Care | Payer: Medicare Other | Source: Ambulatory Visit | Attending: Radiation Oncology | Admitting: Radiation Oncology

## 2016-04-30 DIAGNOSIS — Z510 Encounter for antineoplastic radiation therapy: Secondary | ICD-10-CM | POA: Diagnosis not present

## 2016-04-30 DIAGNOSIS — C50212 Malignant neoplasm of upper-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 breast: Secondary | ICD-10-CM | POA: Diagnosis not present

## 2016-04-30 DIAGNOSIS — Z170 Estrogen receptor positive status [ER+]: Secondary | ICD-10-CM | POA: Diagnosis not present

## 2016-04-30 DIAGNOSIS — C50411 Malignant neoplasm of upper-outer quadrant of right female breast: Secondary | ICD-10-CM | POA: Diagnosis not present

## 2016-05-01 ENCOUNTER — Encounter: Payer: Self-pay | Admitting: Radiation Oncology

## 2016-05-01 ENCOUNTER — Ambulatory Visit
Admission: RE | Admit: 2016-05-01 | Discharge: 2016-05-01 | Disposition: A | Discharge status: Home / Self Care | Payer: Medicare Other | Source: Ambulatory Visit | Attending: Radiation Oncology | Admitting: Radiation Oncology

## 2016-05-01 VITALS — BP 150/68 | HR 73 | Temp 97.7°F

## 2016-05-01 DIAGNOSIS — C50411 Malignant neoplasm of upper-outer quadrant of right female breast: Secondary | ICD-10-CM

## 2016-05-01 DIAGNOSIS — C50212 Malignant neoplasm of upper-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 breast: Secondary | ICD-10-CM

## 2016-05-01 DIAGNOSIS — Z170 Estrogen receptor positive status [ER+]: Secondary | ICD-10-CM | POA: Diagnosis not present

## 2016-05-01 DIAGNOSIS — Z510 Encounter for antineoplastic radiation therapy: Secondary | ICD-10-CM | POA: Diagnosis not present

## 2016-05-01 NOTE — Progress Notes (Signed)
Kristen Howe presnts today with bright erythema of her tx field with breaks in skin integrity in the bilateral, lower neck region.  C/o constant itching and burning of skin. Pain a level 10/10.  Also note erythema of bilateral shoulder blades.

## 2016-05-01 NOTE — Progress Notes (Signed)
Department of Radiation Oncology  Phone:  [phone] Fax:        [phone]  Weekly Treatment Note  Name: Kristen Howe Date: 05/01/2016 MRN: [id number] DOB: January 24, 1946   Current dose: 50.4 Gy  Current fraction:28   MEDICATIONS: Current Outpatient Prescriptions  Medication Sig Dispense Refill  . Artificial Tear Ointment (DRY EYES OP) Apply 1 drop to eye daily as needed (for dry eyes).     . Calcium-Phosphorus-Vitamin D (CITRACAL +D3 PO) Take 2 tablets by mouth 2 (two) times daily.     . cyclobenzaprine (FLEXERIL) 10 MG tablet TAKE ONE TABLET BY MOUTH THREE TIMES DAILY AS NEEDED FOR MUSCLE SPASM 30 tablet 0  . gemfibrozil (LOPID) 600 MG tablet Take 1 tablet (600 mg total) by mouth 2 (two) times daily before a meal. 180 tablet 0  . hyaluronate sodium (RADIAPLEXRX) GEL Apply 1 application topically 2 (two) times daily. Apply to breast treated area after rad tx and bedtime    . ibuprofen (ADVIL,MOTRIN) 200 MG tablet Take 400 mg by mouth 2 (two) times daily as needed for moderate pain.    Marland Kitchen losartan-hydrochlorothiazide (HYZAAR) 50-12.5 MG per tablet Take 1 tablet by mouth daily. 90 tablet 1  . Multiple Vitamins-Minerals (MULTIVITAMIN WITH MINERALS) tablet Take 1 tablet by mouth daily. Reported on [id number]    . non-metallic deodorant Jethro Poling) MISC Apply 1 application topically daily as needed.    . pantoprazole (PROTONIX) 40 MG tablet Take 1 tablet (40 mg total) by mouth daily. (Patient taking differently: Take 40 mg by mouth daily as needed (for acid reflux or heartburn). ) 30 tablet 0  . Wound Dressings (SONAFINE) Apply 1 application topically 2 (two) times daily.     No current facility-administered medications for this encounter.     ALLERGIES: Review of patient's allergies indicates no known allergies.   LABORATORY DATA:  Lab Results  Component Value Date   WBC 5.8 04/16/2016   HGB 13.3 04/16/2016   HCT 40.9 04/16/2016   MCV 84.5 04/16/2016   PLT 288 04/16/2016    Lab Results  Component Value Date   NA 140 04/16/2016   K 4.2 04/16/2016   CL 112* 01/26/2016   CO2 22 04/16/2016   Lab Results  Component Value Date   ALT 16 04/16/2016   AST 15 04/16/2016   ALKPHOS 85 04/16/2016   BILITOT <0.30 04/16/2016     NARRATIVE: [name] was seen today for weekly treatment management. The chart was checked and the patient's films were reviewed. Ms. Golson presents today with bright erythema of her tx field with breaks in skin integrity in the bilateral lower neck region. She reports constant itching and burning of skin at a pain level of 10/10. Also note erythema of bilateral shoulder blades.  PHYSICAL EXAMINATION: temperature is 97.7 F (36.5 C). Her blood pressure is 150/68 and her pulse is 73.    In general this is a well appearing She in no acute distress. She's alert and oriented x4 and appropriate throughout the examination. Cardiopulmonary assessment is negative for acute distress and she exhibits normal effort.   The patient has some dermatitis present bilaterally in the treatment area. Moist desquamation is present in the supraclavicular regions.   ASSESSMENT: The patient did satisfactorily with treatment.  PLAN: We will continue with radiation treatment as planned. The patient's boost area looks quite good with the dominant area of skin irritation on each side out of concurrent radiation treatment field. I will see her again later this  week.

## 2016-05-02 ENCOUNTER — Ambulatory Visit
Admission: RE | Admit: 2016-05-02 | Discharge: 2016-05-02 | Disposition: A | Discharge status: Home / Self Care | Payer: Medicare Other | Source: Ambulatory Visit | Attending: Radiation Oncology | Admitting: Radiation Oncology

## 2016-05-02 DIAGNOSIS — Z170 Estrogen receptor positive status [ER+]: Secondary | ICD-10-CM | POA: Diagnosis not present

## 2016-05-02 DIAGNOSIS — Z510 Encounter for antineoplastic radiation therapy: Secondary | ICD-10-CM | POA: Diagnosis not present

## 2016-05-02 DIAGNOSIS — C50411 Malignant neoplasm of upper-outer quadrant of right female breast: Secondary | ICD-10-CM | POA: Diagnosis not present

## 2016-05-02 DIAGNOSIS — C50212 Malignant neoplasm of upper-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 breast: Secondary | ICD-10-CM | POA: Diagnosis not present

## 2016-05-03 ENCOUNTER — Ambulatory Visit
Admission: RE | Admit: 2016-05-03 | Discharge: 2016-05-03 | Disposition: A | Discharge status: Home / Self Care | Payer: Medicare Other | Source: Ambulatory Visit | Attending: Radiation Oncology | Admitting: Radiation Oncology

## 2016-05-03 DIAGNOSIS — Z510 Encounter for antineoplastic radiation therapy: Secondary | ICD-10-CM | POA: Diagnosis not present

## 2016-05-03 DIAGNOSIS — C50212 Malignant neoplasm of upper-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 breast: Secondary | ICD-10-CM

## 2016-05-03 DIAGNOSIS — C50211 Malignant neoplasm of upper-inner quadrant of right female breast: Secondary | ICD-10-CM

## 2016-05-03 DIAGNOSIS — Z170 Estrogen receptor positive status [ER+]: Secondary | ICD-10-CM | POA: Diagnosis not present

## 2016-05-03 DIAGNOSIS — C50411 Malignant neoplasm of upper-outer quadrant of right female breast: Secondary | ICD-10-CM

## 2016-05-03 MED ORDER — SILVER SULFADIAZINE 1 % EX CREA
TOPICAL_CREAM | Freq: Two times a day (BID) | CUTANEOUS | Status: DC
  Administered 2016-05-03: 09:00:00 via TOPICAL

## 2016-05-03 NOTE — Progress Notes (Signed)
Department of Radiation Oncology  Phone:  [phone] Fax:        [phone]  Weekly Treatment Note    Name: Kristen Howe Date: 05/03/2016 MRN: [id number] DOB: 04-21-1946   Diagnosis:     ICD-9-CM ICD-10-CM   1. Breast cancer of upper-outer quadrant of right female breast ([place]) 174.4 C50.411   2. Primary cancer of 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 breast ([facility]) 174.2 C50.212      Current dose: 58.4 Gy  Current fraction: 32   MEDICATIONS: Current Outpatient Prescriptions  Medication Sig Dispense Refill  . Artificial Tear Ointment (DRY EYES OP) Apply 1 drop to eye daily as needed (for dry eyes).     . Calcium-Phosphorus-Vitamin D (CITRACAL +D3 PO) Take 2 tablets by mouth 2 (two) times daily.     . cyclobenzaprine (FLEXERIL) 10 MG tablet TAKE ONE TABLET BY MOUTH THREE TIMES DAILY AS NEEDED FOR MUSCLE SPASM 30 tablet 0  . gemfibrozil (LOPID) 600 MG tablet Take 1 tablet (600 mg total) by mouth 2 (two) times daily before a meal. 180 tablet 0  . hyaluronate sodium (RADIAPLEXRX) GEL Apply 1 application topically 2 (two) times daily. Apply to breast treated area after rad tx and bedtime    . ibuprofen (ADVIL,MOTRIN) 200 MG tablet Take 400 mg by mouth 2 (two) times daily as needed for moderate pain.    Marland Kitchen losartan-hydrochlorothiazide (HYZAAR) 50-12.5 MG per tablet Take 1 tablet by mouth daily. 90 tablet 1  . Multiple Vitamins-Minerals (MULTIVITAMIN WITH MINERALS) tablet Take 1 tablet by mouth daily. Reported on [id number]    . non-metallic deodorant Jethro Poling) MISC Apply 1 application topically daily as needed.    . pantoprazole (PROTONIX) 40 MG tablet Take 1 tablet (40 mg total) by mouth daily. (Patient taking differently: Take 40 mg by mouth daily as needed (for acid reflux or heartburn). ) 30 tablet 0  . Wound Dressings (SONAFINE) Apply 1 application topically 2 (two) times daily.     No current facility-administered medications for this encounter.   Facility-Administered  Medications Ordered in Other Encounters  Medication Dose Route Frequency Provider Last Rate Last Dose  . silver sulfADIAZINE (SILVADENE) 1 % cream   Topical BID Hayden Pedro, PA-C         ALLERGIES: Review of patient's allergies indicates no known allergies.   LABORATORY DATA:  Lab Results  Component Value Date   WBC 5.8 04/16/2016   HGB 13.3 04/16/2016   HCT 40.9 04/16/2016   MCV 84.5 04/16/2016   PLT 288 04/16/2016   Lab Results  Component Value Date   NA 140 04/16/2016   K 4.2 04/16/2016   CL 112* 01/26/2016   CO2 22 04/16/2016   Lab Results  Component Value Date   ALT 16 04/16/2016   AST 15 04/16/2016   ALKPHOS 85 04/16/2016   BILITOT <0.30 04/16/2016     NARRATIVE: Kristen Howe was seen today for weekly treatment management. The chart was checked and the patient's films were reviewed.  The patient complains of continued skin irritation, especially at night, in the supraclavicular regions bilaterally. She is using topical cream including lidocaine but this does not last as long as is necessary.  PHYSICAL EXAMINATION: vitals were not taken for this visit.     Continued moist desquamation bilaterally in the supra-clavicular region. Dermatitis/erythema elsewhere in the treatment area.  ASSESSMENT: The patient is doing satisfactorily with treatment.  PLAN: We will continue with the patient's radiation treatment as planned. The patient will  begin using Silvadene cream which has been given to her today.

## 2016-05-03 NOTE — Progress Notes (Signed)
  Radiation Oncology         [phone]) [phone] ________________________________  Name: Kristen Howe MRN: [id number]  Date: 04/24/2016  DOB: [birth year]  Complex simulation note  The patient has undergone complex simulation for her upcoming boost treatment for her diagnosis of bilateral breast cancer. The patient has initially been planned to receive 50.4 Gy to the chest walls bilaterally. The patient will now receive a 10 Gy boost to each mastectomy scar which has been identified. This will be accomplished using  2 en face electron fields. Based on the depth of the target area, 6 MeV electrons will be used. The patient's final total dose therefore will be 60.4 Gy. A special port plan is requested for the boost treatment.   _______________________________  Jodelle Gross, MD, PhD

## 2016-05-06 ENCOUNTER — Ambulatory Visit
Admission: RE | Admit: 2016-05-06 | Discharge: 2016-05-06 | Disposition: A | Discharge status: Home / Self Care | Payer: Medicare Other | Source: Ambulatory Visit | Attending: Radiation Oncology | Admitting: Radiation Oncology

## 2016-05-06 ENCOUNTER — Encounter: Payer: Self-pay | Admitting: Radiation Oncology

## 2016-05-06 ENCOUNTER — Telehealth: Payer: Self-pay | Admitting: *Deleted

## 2016-05-06 DIAGNOSIS — C50411 Malignant neoplasm of upper-outer quadrant of right female breast: Secondary | ICD-10-CM | POA: Diagnosis not present

## 2016-05-06 DIAGNOSIS — C50011 Malignant neoplasm of nipple and areola, right female breast: Secondary | ICD-10-CM

## 2016-05-06 DIAGNOSIS — Z510 Encounter for antineoplastic radiation therapy: Secondary | ICD-10-CM | POA: Diagnosis not present

## 2016-05-06 DIAGNOSIS — Z170 Estrogen receptor positive status [ER+]: Secondary | ICD-10-CM | POA: Diagnosis not present

## 2016-05-06 DIAGNOSIS — C50212 Malignant neoplasm of upper-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 breast: Secondary | ICD-10-CM | POA: Diagnosis not present

## 2016-05-06 DIAGNOSIS — C50012 Malignant neoplasm of nipple and areola, left female breast: Principal | ICD-10-CM

## 2016-05-06 MED ORDER — SILVER SULFADIAZINE 1 % EX CREA
TOPICAL_CREAM | Freq: Two times a day (BID) | CUTANEOUS | Status: DC
  Administered 2016-05-06: 09:00:00 via TOPICAL

## 2016-05-06 NOTE — Telephone Encounter (Signed)
  Oncology Nurse Navigator Documentation    Navigator Encounter Type: Telephone (05/06/16 1400) Telephone: Outgoing Call (05/06/16 1400)         Patient Visit Type: [id number] (05/06/16 1400) Treatment Phase: Final Radiation Tx (05/06/16 1400) Barriers/Navigation Needs: No barriers at this time;No Questions;No Needs (05/06/16 1400)   Interventions: Referrals (05/06/16 1400) Referrals: Survivorship (05/06/16 1400)                    Time Spent with Patient: 15 (05/06/16 1400)

## 2016-05-06 NOTE — Progress Notes (Addendum)
Patient completed tx, gave 2 more silvadne creams 50gm jars and a box of gel pads for cooling, she was seen last Friday, no MD to see today, gave 3 week f/u appt to see Shona Simpson PA-C 8:46 AM

## 2016-05-14 ENCOUNTER — Other Ambulatory Visit: Payer: Self-pay | Admitting: Adult Health

## 2016-05-14 DIAGNOSIS — C50212 Malignant neoplasm of upper-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 breast: Secondary | ICD-10-CM

## 2016-05-14 DIAGNOSIS — C50411 Malignant neoplasm of upper-outer quadrant of right female breast: Secondary | ICD-10-CM

## 2016-05-15 ENCOUNTER — Telehealth: Payer: Self-pay | Admitting: Oncology

## 2016-05-15 NOTE — Telephone Encounter (Signed)
appt made and pt will get updated schedule on 6/5 visit

## 2016-05-20 ENCOUNTER — Other Ambulatory Visit (HOSPITAL_BASED_OUTPATIENT_CLINIC_OR_DEPARTMENT_OTHER): Payer: Medicare Other

## 2016-05-20 ENCOUNTER — Telehealth: Payer: Self-pay | Admitting: Oncology

## 2016-05-20 ENCOUNTER — Ambulatory Visit (HOSPITAL_BASED_OUTPATIENT_CLINIC_OR_DEPARTMENT_OTHER): Payer: Medicare Other | Admitting: Oncology

## 2016-05-20 VITALS — BP 133/76 | HR 81 | Temp 98.4°F | Resp 18 | Ht 60.0 in | Wt 164.2 lb

## 2016-05-20 DIAGNOSIS — C50411 Malignant neoplasm of upper-outer quadrant of right female breast: Secondary | ICD-10-CM

## 2016-05-20 DIAGNOSIS — C50212 Malignant neoplasm of upper-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 breast: Secondary | ICD-10-CM

## 2016-05-20 DIAGNOSIS — C50112 Malignant neoplasm of central portion of left female breast: Secondary | ICD-10-CM | POA: Diagnosis not present

## 2016-05-20 DIAGNOSIS — C773 Secondary and unspecified malignant neoplasm of axilla and upper limb lymph nodes: Secondary | ICD-10-CM | POA: Diagnosis not present

## 2016-05-20 DIAGNOSIS — R208 Other disturbances of skin sensation: Secondary | ICD-10-CM | POA: Diagnosis not present

## 2016-05-20 DIAGNOSIS — C50412 Malignant neoplasm of upper-outer quadrant of left female breast: Secondary | ICD-10-CM | POA: Diagnosis not present

## 2016-05-20 DIAGNOSIS — Z720 Tobacco use: Secondary | ICD-10-CM

## 2016-05-20 LAB — CBC WITH DIFFERENTIAL/PLATELET
BASO%: 0.6 % (ref 0.0–2.0)
Basophils Absolute: 0 10*3/uL (ref 0.0–0.1)
EOS ABS: 0.2 10*3/uL (ref 0.0–0.5)
EOS%: 4.4 % (ref 0.0–7.0)
HEMATOCRIT: 37.9 % (ref 34.8–46.6)
HEMOGLOBIN: 12.6 g/dL (ref 11.6–15.9)
LYMPH#: 0.7 10*3/uL — AB (ref 0.9–3.3)
LYMPH%: 12.3 % — ABNORMAL LOW (ref 14.0–49.7)
MCH: 27.8 pg (ref 25.1–34.0)
MCHC: 33.2 g/dL (ref 31.5–36.0)
MCV: 83.9 fL (ref 79.5–101.0)
MONO#: 0.6 10*3/uL (ref 0.1–0.9)
MONO%: 10.5 % (ref 0.0–14.0)
NEUT%: 72.2 % (ref 38.4–76.8)
NEUTROS ABS: 4 10*3/uL (ref 1.5–6.5)
PLATELETS: 307 10*3/uL (ref 145–400)
RBC: 4.52 10*6/uL (ref 3.70–5.45)
RDW: 15.1 % — ABNORMAL HIGH (ref 11.2–14.5)
WBC: 5.6 10*3/uL (ref 3.9–10.3)

## 2016-05-20 LAB — COMPREHENSIVE METABOLIC PANEL
ALBUMIN: 3.6 g/dL (ref 3.5–5.0)
ALK PHOS: 87 U/L (ref 40–150)
ALT: 26 U/L (ref 0–55)
ANION GAP: 7 meq/L (ref 3–11)
AST: 19 U/L (ref 5–34)
BILIRUBIN TOTAL: 0.3 mg/dL (ref 0.20–1.20)
BUN: 12.2 mg/dL (ref 7.0–26.0)
CALCIUM: 9.7 mg/dL (ref 8.4–10.4)
CO2: 27 meq/L (ref 22–29)
CREATININE: 0.9 mg/dL (ref 0.6–1.1)
Chloride: 109 mEq/L (ref 98–109)
EGFR: 65 mL/min/{1.73_m2} — ABNORMAL LOW (ref >90)
Glucose: 98 mg/dl (ref 70–140)
Potassium: 4.3 mEq/L (ref 3.5–5.1)
Sodium: 144 mEq/L (ref 136–145)
TOTAL PROTEIN: 7 g/dL (ref 6.4–8.3)

## 2016-05-20 MED ORDER — ANASTROZOLE 1 MG PO TABS: 1.0000 mg | ORAL_TABLET | Freq: Every day | ORAL | Status: DC

## 2016-05-20 MED ORDER — HYDROXYZINE HCL 10 MG PO TABS: 10.0000 mg | ORAL_TABLET | Freq: Three times a day (TID) | ORAL | Status: DC | PRN

## 2016-05-20 NOTE — Telephone Encounter (Signed)
Gave and printed appt sched and avs for pt for Sept °

## 2016-05-20 NOTE — Progress Notes (Signed)
[place]  [phone] [phone]     ID: Kristen Howe DOB: 03/08/1946  MR#: [id number]  JYN#:[id number]  Patient Care Team: Golden Circle, FNP as PCP - General (Family Medicine) Chauncey Cruel, MD as Consulting Physician (Oncology) Kyung Rudd, MD as Consulting Physician (Radiation Oncology) Stark Klein, MD as Consulting Physician (General Surgery) PCP: Mauricio Po, FNP GYN: OTHER MD:  CHIEF COMPLAINT: bilateral breast cancer  CURRENT TREATMENT: Anti-estrogens   BREAST CANCER HISTORY: From the original intake note:  "Kristen Howe" herself palpated a mass in her right breast sometime in November 2016. She brought it to Dr. Purcell Nails attention on 11/20/2015 and he set her up for bilateral diagnostic mammography with tomography and I lateral breast [facility] 29 2015. The breast density was category B. In the right breast upper outer quadrant there where 2 nodules, at the 9:00 position (which by ultrasound was spiculated and measured 1.7 cm) and at the 11:00 position (which by ultrasound measured 0.7 cm. The right axilla showed a grossly abnormal lymph node measuring 1.4 cm with complete effacement of the hilum.there were several other small lymph nodes with cortical thickening in the lower right axilla as well.  Biopsy of the right breast mass on 12/12/2015 showed ([id number]) and invasive ductal carcinoma, grade 2, estrogen receptor 100% positive, progesterone receptor 80% positive, both with strong staining intensity, with an MIB-1 of 20%, and HER-2 nonamplified, with a signals ratio of 1.33 and number per cell 3.25.  In the left breast mammographically there was a multifocal mass centered at the 12:00 location with suspicious calcifications. The processes measured 6.7 cm altogether. A dilated duct extended to the nipple from this area, containing microcalcifications. Ultrasonography of the left breast found a  multifocal fragmented mass superiorly, the main component being hypoechoic and irregular and tolerable than wide, measuring 2.1 cm.superior to that, there was a second mass measuring 1.9 cm and there were other suspicious solid nodules in the adjacent parenchyma. There was also a separate area of microcalcifications containing a suspicious nodule at the 1:00 position. This measured 0.8 cm. The ultrasound confirmed a dilated duct extending from the superior process to the nipple. An alteration of the left axilla showed a grossly abnormal lymph node measuring up to 1 cm and a second indeterminant lymph node.  Biopsy of the left breast 11:30 o'clock massshowed invasive ductal carcinoma, grade 2, estrogen receptor 100% positive, progesterone receptor 70% positive, with an MIB-1 of 20%, and no HER-2 amplification, the signals ratio being 1.59 and the number per cell 3.90. A second area in the breast described as retroareolar showed ductal carcinoma, possibly in situ. This was also estrogen receptor positive at 100%, progesterone receptor positive at 60%, with an MIB-1 of 5%, and HER-2 negative, with a signals ratio of 1.73 and number per cell 3.55. Biopsy of the left breast lymph node was positiveand the prognostic panel there was very similar to all the others, namely estrogen receptor 100% positive, progesterone receptor 40% positive, with an MIB-1 of 40%, and HER-2 no amplification, with a signals ratio of 1.52 and number per cell 3.85.  The patient's subsequent history is as detailed below.  INTERVAL HISTORY: Kristen Howe returns today for follow-up of her bilateral estrogen receptor positive breast cancers. She completed her radiation treatments 05/06/2016. She was moderately fatigued, and she still a little bit tired, taking it easy in the afternoons, she says. The worst problem was "burning in my neck", which is still burden  itchy. She is using cortisone and other creams which are really not helping very  much  REVIEW OF SYSTEMS: Kristen Howe is gaining weight but doesn't know why. She has not changed her diet. She walks about an hour at a time when she goes shopping with her sister, but does not otherwise exercise. She does have some vaginal dryness. Otherwise a detailed review of systems was noncontributory   PAST MEDICAL HISTORY: Past Medical History  Diagnosis Date  . Hypertension   . Hyperlipidemia   . GERD (gastroesophageal reflux disease)   . Leg cramps     takes Flexeril  . Anemia     as a child  . Primary cancer of 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 breast ([facility]) 12/14/2015  . Bilateral breast cancer ([facility])     Archie Endo 01/25/2016  . Rheumatic fever 1950s  . Pneumonia ~ 2005 X 1  . Migraine     "maybe monthly" (01/25/2016)  . Arthritis     "lower back" (01/25/2016)    PAST SURGICAL HISTORY: Past Surgical History  Procedure Laterality Date  . Cesarean section  1974; 1976  . Umbilical hernia repair N/A 09/12/2015    Procedure: REPAIR OF INCISIONAL AND UMBILICAL HERNIA;  Surgeon: Erroll Luna, MD;  Location: [facility];  Service: General;  Laterality: N/A;  . Insertion of mesh N/A 09/12/2015    Procedure: INSERTION OF MESH;  Surgeon: Erroll Luna, MD;  Location: [facility];  Service: General;  Laterality: N/A;  . Mastectomy modified radical Left 01/25/2016  . Mastectomy complete / simple w/ sentinel node biopsy Right 01/25/2016  . Hernia repair    . Tonsillectomy  1950s  . Breast biopsy Bilateral 2016  . Cataract extraction w/ intraocular lens  implant, bilateral Bilateral 05/2015-06/2015  . Eye muscle surgery Bilateral 1950s    for cross eyes as a child  . Mastectomy w/ sentinel node biopsy Bilateral 01/25/2016    Procedure: RIGHT MASTECTOMY WITH SENTINEL RIGHT LYMPH NODE BIOPSY, LEFT MODIFIED RADICAL MASTECTOMY;  Surgeon: Stark Klein, MD;  Location: [facility];  Service: General;  Laterality: Bilateral;    FAMILY HISTORY Family History  Problem Relation Age of Onset  . Healthy Mother   . Other  Mother     +"throat polyps"; +smoker  . Lymphoma Father     d. 64  . Diabetes Father   . Heart Problems Father   . Other Father     "tumors on his kidneys"  . Breast cancer Sister     dx. metachronous bilateral breast cancers at 59 and 29; - BRCA1/2 testing  . Cancer Maternal Grandmother 47    dx. spinal cancer  . Stroke Maternal Grandfather   . Gout Paternal Grandmother   . Gout Paternal Grandfather   . Lung cancer Maternal Uncle     d. 70-72; +smoker  . Diabetes Paternal Aunt   . Diabetes Paternal Uncle   . Breast cancer Cousin     maternal 1st cousin dx. unspecified age  . Lung cancer Cousin     maternal 1st cousin dx. lung cancer; +smoker  . Lupus Cousin   . Breast cancer Cousin     paternal 1st cousin dx. 65s  . Breast cancer Cousin     paternal 1st cousin dx. 98s-30s  . Cancer Cousin     paternal 1st cousin dx. oral cancer with metastasis, also "lost a leg to cancer"; dx. 26s; +EtOH abuse  . Breast cancer Cousin     paternal 1st cousin, once-removed dx.  at unspecified age  the patient's father died at the age of 13 from complications of diabetes. He had a history of lymphoma. The patient's mother is currently living at age 55. The patient's brother died in an automobile accident. The patient has a twin sister, Varney Biles. She has a history of bilateral breast cancers and did undergo genetic testing in November 2012, with no BRCA1 or 2 mutation found  GYNECOLOGIC HISTORY:  No LMP recorded. Patient is postmenopausal. Menarche age 26, first live birth age 20. The patient is GX P2. She stopped having periods approximately 1997. She did not take hormone replacement. She took oral contraceptives remotely for approximately 3 years, with no complications.  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 She is divorced and lives at home with her sister Joycelyn Schmid, with their mother, and with 2 nephews, Rush Landmark, who is not employed,  and Corene Cornea, who is disabled. The patient has 3 grandchildren. She is not a church attender    ADVANCED DIRECTIVES: Not in place. At the initial clinic visit  the patient was given the appropriate forms to complete and notarize at her discretion.  HEALTH MAINTENANCE: Social History  Substance Use Topics  . Smoking status: Former Smoker -- 1.00 packs/day for 55 years    Types: Cigarettes    Quit date: 12/24/2015  . Smokeless tobacco: Never Used  . Alcohol Use: 0.0 oz/week    0 Standard drinks or equivalent per week     Comment: 04/16/2016 "1/2 bottle wine per month" previously - not much now     Colonoscopy: never  PAP: 2010  Bone density: remote  Lipid panel:  No Known Allergies  Current Outpatient Prescriptions  Medication Sig Dispense Refill  . Artificial Tear Ointment (DRY EYES OP) Apply 1 drop to eye daily as needed (for dry eyes).     . Calcium-Phosphorus-Vitamin D (CITRACAL +D3 PO) Take 2 tablets by mouth 2 (two) times daily.     . cyclobenzaprine (FLEXERIL) 10 MG tablet TAKE ONE TABLET BY MOUTH THREE TIMES DAILY AS NEEDED FOR MUSCLE SPASM 30 tablet 0  . gemfibrozil (LOPID) 600 MG tablet Take 1 tablet (600 mg total) by mouth 2 (two) times daily before a meal. 180 tablet 0  . hyaluronate sodium (RADIAPLEXRX) GEL Apply 1 application topically 2 (two) times daily. Apply to breast treated area after rad tx and bedtime    . ibuprofen (ADVIL,MOTRIN) 200 MG tablet Take 400 mg by mouth 2 (two) times daily as needed for moderate pain.    Marland Kitchen losartan-hydrochlorothiazide (HYZAAR) 50-12.5 MG per tablet Take 1 tablet by mouth daily. 90 tablet 1  . Multiple Vitamins-Minerals (MULTIVITAMIN WITH MINERALS) tablet Take 1 tablet by mouth daily. Reported on 03/20/8098    . non-metallic deodorant Jethro Poling) MISC Apply 1 application topically daily as needed.    . pantoprazole (PROTONIX) 40 MG tablet Take 1 tablet (40 mg total) by mouth daily. (Patient taking differently: Take 40 mg by mouth daily as needed  (for acid reflux or heartburn). ) 30 tablet 0  . Wound Dressings (SONAFINE) Apply 1 application topically 2 (two) times daily.     No current facility-administered medications for this visit.    OBJECTIVE: middle-aged white woman   Filed Vitals:   05/20/16 1444  BP: 133/76  Pulse: 81  Temp: 98.4 F (36.9 C)  Resp: 18     Body mass index is 32.07 kg/(m^2).  ECOG FS:1 - Symptomatic but completely ambulatory  Sclerae unicteric, EOMs intact Oropharynx clear and moist No cervical or supraclavicular adenopathy Lungs no rales or rhonchi Heart regular rate and rhythm Abd soft, nontender, positive bowel sounds MSK no focal spinal tenderness, no upper extremity lymphedema Neuro: nonfocal, well oriented, appropriate affect Breasts: Status post bilateral mastectomies. There is no evidence of dehiscence or swelling. On the other hand there is significant erythema bilaterally secondary to the radiation, with significant dry desquamation left greater than right. Both axillae are benign.   LAB RESULTS:  CMP     Component Value Date/Time   NA 144 05/20/2016 1425   NA 141 01/26/2016 0430   K 4.3 05/20/2016 1425   K 4.5 01/26/2016 0430   CL 112* 01/26/2016 0430   CO2 27 05/20/2016 1425   CO2 25 01/26/2016 0430   GLUCOSE 98 05/20/2016 1425   GLUCOSE 137* 01/26/2016 0430   BUN 12.2 05/20/2016 1425   BUN 8 01/26/2016 0430   CREATININE 0.9 05/20/2016 1425   CREATININE 0.77 01/26/2016 0430   CALCIUM 9.7 05/20/2016 1425   CALCIUM 8.4* 01/26/2016 0430   PROT 7.0 05/20/2016 1425   PROT 7.1 09/06/2015 1219   ALBUMIN 3.6 05/20/2016 1425   ALBUMIN 4.0 09/06/2015 1219   AST 19 05/20/2016 1425   AST 20 09/06/2015 1219   ALT 26 05/20/2016 1425   ALT 21 09/06/2015 1219   ALKPHOS 87 05/20/2016 1425   ALKPHOS 67 09/06/2015 1219   BILITOT 0.30 05/20/2016 1425   BILITOT 0.4 09/06/2015 1219   GFRNONAA >60 01/26/2016 0430   GFRAA >60 01/26/2016 0430    INo results found for: SPEP,  UPEP  Lab Results  Component Value Date   WBC 5.6 05/20/2016   NEUTROABS 4.0 05/20/2016   HGB 12.6 05/20/2016   HCT 37.9 05/20/2016   MCV 83.9 05/20/2016   PLT 307 05/20/2016      Chemistry      Component Value Date/Time   NA 144 05/20/2016 1425   NA 141 01/26/2016 0430   K 4.3 05/20/2016 1425   K 4.5 01/26/2016 0430   CL 112* 01/26/2016 0430   CO2 27 05/20/2016 1425   CO2 25 01/26/2016 0430   BUN 12.2 05/20/2016 1425   BUN 8 01/26/2016 0430   CREATININE 0.9 05/20/2016 1425   CREATININE 0.77 01/26/2016 0430      Component Value Date/Time   CALCIUM 9.7 05/20/2016 1425   CALCIUM 8.4* 01/26/2016 0430   ALKPHOS 87 05/20/2016 1425   ALKPHOS 67 09/06/2015 1219   AST 19 05/20/2016 1425   AST 20 09/06/2015 1219   ALT 26 05/20/2016 1425   ALT 21 09/06/2015 1219   BILITOT 0.30 05/20/2016 1425   BILITOT 0.4 09/06/2015 1219       No results found for: LABCA2  No components found for: GNFAO130  No results for input(s): INR in the last 168 hours.  Urinalysis    Component Value Date/Time   COLORURINE YELLOW 01/17/2016 0910   APPEARANCEUR CLEAR 01/17/2016 0910   LABSPEC 1.022 01/17/2016 0910   PHURINE 6.0 01/17/2016 0910   GLUCOSEU NEGATIVE 01/17/2016 0910   HGBUR NEGATIVE 01/17/2016 0910   BILIRUBINUR NEGATIVE 01/17/2016 0910   KETONESUR NEGATIVE 01/17/2016 0910   PROTEINUR NEGATIVE 01/17/2016 0910   NITRITE NEGATIVE 01/17/2016 0910   LEUKOCYTESUR TRACE* 01/17/2016 0910    STUDIES: No results found.  ASSESSMENT: 70 y.o. BRCA negative [place] woman  (1) status post bilateral breast biopsies and left axillary lymph  node biopsy 12/12/2015, showing  (a) on the Right, a clinical mT1c N1, stage IIA, estrogen and progesterone receptor positive, HER-2 not amplified, with an MIB-1 of 20%  (b) on the left a clinical mT2 pN1, stage IIB invasive ductal carcinoma, grade 2 over 3, estrogen and progesterone receptor positive, HER-2 not amplified, with MIB-1 between 5 and  40%  (c) chest CT and bone scan 01/04/2016 showed no evidence of metastatic disease. They did suggest a large esophageal diverticulum  (2) Status post right mastectomy with sentinel lymph node sampling and left modified radical mastectomy 01/25/2016  (a) right side: pT2, pN1, stage IIB invasive ductal carcinoma, grade 2, with negative margins  (b) left side: pT3 pN1, stage IIIA invasive ductal carcinoma, grade 2, with negative margins  (c) both cancers were estrogen and progesterone receptor positive, HER-2 nonamplified, with MIB-1 between 5 and 20%   (3) Mammaprint on both breast cancers came back "low risk" predicting a 5 year distant metastasis free survival in the 96% range   (4) no chemotherapy and no right-sided axillary lymph node dissection recommended at conference 02/21/2016  (5) adjuvant radiation completed 05/06/2016   (6) anti-estrogens to follow radiation  (7) genetics testing 04/26/2016 through the [facility] through [employer], MD) found no deleterious mutations in ATM, BARD1, BRCA1, BRCA2, BRIP1, CDH1, CHEK2, FANCC, MLH1, MSH2, MSH6, NBN, PALB2, PMS2, PTEN, RAD51C, RAD51D, TP53, and XRCC2. This panel also includes deletion/duplication analysis (without sequencing) for one gene, EPCAM.  PLAN: Kristen Howe has completed local treatment for her breast cancers. She is still recovering from the radiation and S very unsettled because of the itching and discomfort she is still experiencing. Hopefully that will clear shortly. I am prescribing some Atarax for her. I have warned her not to drive if she is can it take this medication.  She will then be ready to start systemic therapy. This will consist of anastrozole for 5 years. Today we discussed the possible toxicities, side effects and complications of this agent. We also discussed possible cost issues.  I think it would be prudent not to start until she has recovered a little bit more from her  radiation treatments. We ended up the sighting that July 4 would be a good time to start assay would be easy for her to remember.  She is going to see me again in September. If she tolerates anastrozole well the plan will be to continue that for a total of 5 years minimum.  She knows to call for any problems that may develop before her next visit here.  Chauncey Cruel, MD   05/20/2016 3:18 PM Medical Oncology and Hematology [facility] [address] [place], [place] [address] Tel. [phone]    Fax. [phone]

## 2016-05-27 ENCOUNTER — Ambulatory Visit
Admission: RE | Admit: 2016-05-27 | Discharge: 2016-05-27 | Disposition: A | Discharge status: Home / Self Care | Payer: Medicare Other | Source: Ambulatory Visit | Attending: Radiation Oncology | Admitting: Radiation Oncology

## 2016-05-27 ENCOUNTER — Encounter: Payer: Self-pay | Admitting: Radiation Oncology

## 2016-05-27 VITALS — BP 144/81 | HR 76 | Temp 98.5°F | Ht 60.0 in | Wt 164.0 lb

## 2016-05-27 DIAGNOSIS — C50212 Malignant neoplasm of upper-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 breast: Secondary | ICD-10-CM

## 2016-05-27 DIAGNOSIS — C50012 Malignant neoplasm of nipple and areola, left female breast: Secondary | ICD-10-CM

## 2016-05-27 DIAGNOSIS — C50411 Malignant neoplasm of upper-outer quadrant of right female breast: Secondary | ICD-10-CM

## 2016-05-27 DIAGNOSIS — Z170 Estrogen receptor positive status [ER+]: Secondary | ICD-10-CM | POA: Insufficient documentation

## 2016-05-27 DIAGNOSIS — C50011 Malignant neoplasm of nipple and areola, right female breast: Secondary | ICD-10-CM

## 2016-05-27 NOTE — Progress Notes (Addendum)
Kristen Howe here for reassessment s/p xrt to her bilateral right and left chest wall. Note erythema with dryness and occassional itching at this time.  Given Sonafine to use BID.  She was applying Silvadene to this intact area, therefore, educated that it should be used for moist, non-intact skin and not on dry skin.    Will start Arimidex on July 4th.

## 2016-05-28 NOTE — Progress Notes (Signed)
Radiation Oncology         [phone] ________________________________  Name: Kristen Howe MRN: [id number]  Date: 05/27/2016  DOB: 08-25-1946  Follow-Up Visit Note  CC: Mauricio Po, FNP  Magrinat, Virgie Dad, MD  Diagnosis:   Bilateral breast cancer.    ICD-9-CM ICD-10-CM   1. Bilateral malignant neoplasm involving both nipple and areola in female ([facility]) 174.0 C50.011     C50.012   2. Primary cancer of 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 breast ([facility]) 174.2 C50.212   3. Breast cancer of upper-outer quadrant of right female breast ([facility]) 174.4 C50.411     Interval Since Last Radiation:  3  weeks   03/21/16-05/06/16: 60.4 Gy total dose, with 50.4 Gy to bilateral chest walls and a 10 Gy boost to each mastectomy scar.  Narrative:  The patient returns today for routine follow-up. Of note during treatment the patient did struggle with cutaneous candida of her right mastectomy site and mild dry desquamation. She was treated with topical nystatin, and sonafine. On review of systems, the patient states she's doing great. She is glad to be done with radiation and is going to start Arimidex per Dr. Jana Hakim in July. She denies any skin concerns, fevers, chills, shortness of breath, or chest pain. No other complaints are noted.                               ALLERGIES:  has No Known Allergies.  Meds: Current Outpatient Prescriptions  Medication Sig Dispense Refill  . Artificial Tear Ointment (DRY EYES OP) Apply 1 drop to eye daily as needed (for dry eyes).     . Calcium-Phosphorus-Vitamin D (CITRACAL +D3 PO) Take 2 tablets by mouth 2 (two) times daily.     . cyclobenzaprine (FLEXERIL) 10 MG tablet TAKE ONE TABLET BY MOUTH THREE TIMES DAILY AS NEEDED FOR MUSCLE SPASM 30 tablet 0  . gemfibrozil (LOPID) 600 MG tablet Take 1 tablet (600 mg total) by mouth 2 (two) times daily before a meal. 180 tablet 0  . hydrOXYzine (ATARAX/VISTARIL) 10 MG tablet Take 1 tablet (10 mg total) by mouth 3 (three) times  daily as needed. 30 tablet 0  . ibuprofen (ADVIL,MOTRIN) 200 MG tablet Take 400 mg by mouth 2 (two) times daily as needed for moderate pain.    Marland Kitchen losartan-hydrochlorothiazide (HYZAAR) 50-12.5 MG per tablet Take 1 tablet by mouth daily. 90 tablet 1  . Multiple Vitamins-Minerals (MULTIVITAMIN WITH MINERALS) tablet Take 1 tablet by mouth daily. Reported on [id number]    . non-metallic deodorant Jethro Poling) MISC Apply 1 application topically daily as needed.    . pantoprazole (PROTONIX) 40 MG tablet Take 1 tablet (40 mg total) by mouth daily. (Patient taking differently: Take 40 mg by mouth daily as needed (for acid reflux or heartburn). ) 30 tablet 0  . Wound Dressings (SONAFINE) Apply 1 application topically 2 (two) times daily.    Marland Kitchen anastrozole (ARIMIDEX) 1 MG tablet Take 1 tablet (1 mg total) by mouth daily. (Patient not taking: Reported on 05/27/2016) 90 tablet 4   No current facility-administered medications for this encounter.    Physical Findings:  height is 5' (1.524 m) and weight is 164 lb (74.39 kg). Her temperature is 98.5 F (36.9 C). Her blood pressure is 144/81 and her pulse is 76.   In general this is a well appearing caucasian female in no acute distress. She's alert and oriented x4 and appropriate throughout  the examination. Cardiopulmonary assessment is negative for acute distress and she exhibits normal effort. Her bilateral mastectomy sites and chest walls are evaluated. Her right chest wall no longer has changes concerning for candida. The remaining tissues are negative for edema, or desquamation.     Lab Findings: Lab Results  Component Value Date   WBC 5.6 05/20/2016   WBC 7.8 01/26/2016   HGB 12.6 05/20/2016   HGB 10.0* 01/26/2016   HCT 37.9 05/20/2016   HCT 31.5* 01/26/2016   PLT 307 05/20/2016   PLT 267 01/26/2016    Lab Results  Component Value Date   NA 144 05/20/2016   NA 141 01/26/2016   K 4.3 05/20/2016   K 4.5 01/26/2016   CHLORIDE 109 05/20/2016   CO2 27  05/20/2016   CO2 25 01/26/2016   GLUCOSE 98 05/20/2016   GLUCOSE 137* 01/26/2016   BUN 12.2 05/20/2016   BUN 8 01/26/2016   CREATININE 0.9 05/20/2016   CREATININE 0.77 01/26/2016   BILITOT 0.30 05/20/2016   BILITOT 0.4 09/06/2015   ALKPHOS 87 05/20/2016   ALKPHOS 67 09/06/2015   AST 19 05/20/2016   AST 20 09/06/2015   ALT 26 05/20/2016   ALT 21 09/06/2015   PROT 7.0 05/20/2016   PROT 7.1 09/06/2015   ALBUMIN 3.6 05/20/2016   ALBUMIN 4.0 09/06/2015   CALCIUM 9.7 05/20/2016   CALCIUM 8.4* 01/26/2016   ANIONGAP 7 05/20/2016   ANIONGAP 4* 01/26/2016    Radiographic Findings: No results found.  Impression/Plan: 1. ER/PR positive bilateral breast cancer; Right- Stage IIB (pT2, pN1) Invasive Ductal Carcinoma, Grade 2, Left- Stage IIA (pT3, pN1) Invasive Ductal Carcinoma, Grade 2. The patient has completed her radiotherapy and has done well during treatment. Her skin is intact and healing well. We discussed the rationale for keeping [place] informed of questions or concerns regarding her radiation care moving forward. She will continue in survivorship care and with Dr. Jana Hakim.  2. Survivorship. The patient will meet with Chestine Spore, NP for survivorship evaluation in July 2017.       Carola Rhine, PAC

## 2016-05-30 ENCOUNTER — Encounter: Payer: Self-pay | Admitting: Gastroenterology

## 2016-05-31 ENCOUNTER — Ambulatory Visit: Payer: Medicare Other | Admitting: Gastroenterology

## 2016-06-11 NOTE — Progress Notes (Signed)
  Radiation Oncology         [phone] ________________________________  Name: Kristen Howe MRN: [id number]  Date: 05/06/2016  DOB: 1945-12-29  End of Treatment Note  Diagnosis:   Bilateral breast cancer     Indication for treatment:  Curative       Radiation treatment dates:   03/21/2016 through 05/06/2016  Site/dose:   The patient initially received a dose of 50.4 Gy in 28 fractions to the chest wall and supraclavicular region bilaterally.  This was delivered using a 3-D conformal, 4 field technique. The patient then received a boost to the mastectomy scar. This delivered an additional 10 Gy in 5 fractions using an en face electron field. The total dose to each chest wall/supraclavicular region was 60.4 Gy consisting of 2 separate target regions.  Narrative: The patient tolerated radiation treatment relatively well.   The patient had some expected skin irritation as she progressed during treatment. Moist desquamation was present at the end of treatment.  Plan: The patient has completed radiation treatment. The patient will return to radiation oncology clinic for routine followup in one month. I advised the patient to call or return sooner if they have any questions or concerns related to their recovery or treatment. ________________________________  Jodelle Gross, M.D., Ph.D.

## 2016-06-26 ENCOUNTER — Other Ambulatory Visit: Payer: Self-pay

## 2016-06-26 DIAGNOSIS — I10 Essential (primary) hypertension: Secondary | ICD-10-CM

## 2016-06-26 DIAGNOSIS — E781 Pure hyperglyceridemia: Secondary | ICD-10-CM

## 2016-06-26 MED ORDER — LOSARTAN POTASSIUM-HCTZ 50-12.5 MG PO TABS: 1.0000 | ORAL_TABLET | Freq: Every day | ORAL | Status: DC

## 2016-06-26 MED ORDER — PANTOPRAZOLE SODIUM 40 MG PO TBEC: 40.0000 mg | DELAYED_RELEASE_TABLET | Freq: Every day | ORAL | Status: DC

## 2016-06-26 MED ORDER — GEMFIBROZIL 600 MG PO TABS
600.0000 mg | ORAL_TABLET | Freq: Two times a day (BID) | ORAL | Status: DC
Start: 2016-06-26 — End: 2016-07-24

## 2016-07-09 ENCOUNTER — Encounter: Payer: Medicare Other | Admitting: Nurse Practitioner

## 2016-07-09 NOTE — Progress Notes (Signed)
CLINIC:  Survivorship   REASON FOR VISIT:  Routine follow-up post-treatment for a recent history of breast cancer.  BRIEF ONCOLOGIC HISTORY:    Primary cancer of 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 breast ([place])   12/14/2015 Initial Diagnosis    Primary cancer of 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 breast ([place])      Bilateral breast cancer ([place])   12/12/2015 Initial Biopsy    LEFT breast (11:30) biopsy: IDC, grade 2. ER+ (100%), PR+ (70%), Ki67 20%, HER2 neg (ratio 1.59).  LEFT axilla LN (+) metastatic carcinoma.  LEFT retroareolar biopsy: Ductal carcinoma. ER+ (100%), PR+ (60%), Ki67 5%, HER2 neg (ratio 1.73).      12/12/2015 Initial Biopsy    RIGHT breast (9:30) biopsy: IDC, grade 2. ER+ (100%), PR+ (80%), Ki67 20%, HER2 neg (ratio 1.59).      12/12/2015 Initial Diagnosis    Bilateral breast cancer ([place])     01/04/2016 Imaging    Bone scan: No evidence of metastatic disease .     01/04/2016 Imaging    CT chest: Bilateral breast masses, suspicious for breast carcinomas. Mild bilateral axillary lymphadenopathy, suspicious for metastatic disease. No other sites of metastatic diease in thorax.      01/25/2016 Surgery    Bilateral mastectomies with SLNB Barry Dienes).  LEFT modified radical mastectomy: IDC & DCIS, grade 2, 6.5cm, LVI (+), 1/15 LNs (+) for metastatic carcinoma. Negative margins. ER+ (90%), PR+ (70%), Ki67 20%, HER2 neg (ratio 1.48).   pT3, pN1a: Stage IIIA     01/25/2016 Surgery    Bilateral mastectomies with SLNB Barry Dienes). RIGHT simple mastectomy: IDC spanning 2.1 cm, grade 2, separate focus DCIS 0.5 cm. 1/4 LNs (+) for metatatic carcinoma.  Negative margins. HER2 repeated & remains neg. (ratio 1.20).   pT2, pN1a: Stage IIB     01/25/2016 Miscellaneous    Mammaprint (LEFT breast): Low-risk (+0.221), Luminal-type (+0.81)     01/25/2016 Miscellaneous    Mammaprint (RIGHT breast): Low-risk (+0.226), Luminal-type (+1.427).      02/21/2016 Miscellaneous    Tumor board discussion:  No chemotherapy and no (R)-sided lymph node dissection.      03/21/2016 - 05/06/2016 Radiation Therapy    Adjuvant radiation Lisbeth Renshaw). BILAT chest wall & supraclavicular regions: 50.4 Gy in 28 fractions.  BILAT scar boost: 10 Gy in 5 fractions.      04/16/2016 Procedure    Genetic testing: Normal. Genes analyzed: ATM, BARD1, BRCA1, BRCA2, BRIP1, CDH1, CHEK2, FANCC, MLH1, MSH2, MSH6, NBN, PALB2, PMS2, PTEN, RAD51C, RAD51D, TP53, and XRCC2     06/2016 -  Anti-estrogen oral therapy    Anastrazole 1 mg daily. Planned duration of treatment: 5 years Therapist, nutritional)       INTERVAL HISTORY:  Ms. Nicolson presents to the [facility] today for our initial meeting to review her survivorship care plan detailing her treatment course for breast cancer, as well as monitoring long-term side effects of that treatment, education regarding health maintenance, screening, and overall wellness and health promotion.     Overall, Ms. Regner reports feeling quite well since completing her radiation therapy approximately 2 months ago.  She continues to have some itching to her upper chest/neck area where she is healing from radiation; she has been applying vitamin E lotion to the area; she has also tried hydrocortisone cream, which did not help her very much.  She has some fatigue, but this is improving.  She has some chest wall numbness at her mastectomy sites which is uncomfortable for her  at night, which is affecting her ability to sleep well; "the skin feels very tight and it is uncomfortable when I try to go to sleep."  She has started the anastrazole and is tolerating it well thus far; denies any hot flashes or worsening arthralgias.  She is here with her sister today, who is also a breast cancer survivor.    REVIEW OF SYSTEMS:  Review of Systems  Constitutional: Positive for malaise/fatigue.       (+) fatigue  Eyes: Negative for blurred vision.  Respiratory: Negative for cough and shortness of breath.     Cardiovascular: Positive for leg swelling.       Some feet swelling   Gastrointestinal: Negative for constipation, diarrhea, nausea and vomiting.  Genitourinary: Negative for dysuria.  Musculoskeletal: Negative for joint pain and myalgias.       She does have a history of arthritis, but her pain is not worsened since starting anastrazole.   Skin: Positive for itching.       Itching to upper chest/neck area (healing radiation sites)  Neurological: Negative for dizziness and headaches.  Psychiatric/Behavioral: The patient has insomnia.   GU: Denies vaginal bleeding, discharge, or dryness.  Breast/Chest wall: Denies any new new chest wall ularity, masses, tenderness, nipple changes, or nipple discharge.    A 14-point review of systems was completed and was negative, except as noted above.   ONCOLOGY TREATMENT TEAM:  1. Surgeon:  Dr. Durenda Guthrie at [facility] 2. Medical Oncologist: Dr. Jana Hakim 3. Radiation Oncologist: Dr. Lisbeth Renshaw    PAST MEDICAL/SURGICAL HISTORY:  Past Medical History:  Diagnosis Date  . Anemia    as a child  . Arthritis    "lower back" (01/25/2016)  . Bilateral breast cancer ([place])    Archie Endo 01/25/2016  . GERD (gastroesophageal reflux disease)   . Hyperlipidemia   . Hypertension   . Leg cramps    takes Flexeril  . Migraine    "maybe monthly" (01/25/2016)  . Pneumonia ~ 2005 X 1  . Primary cancer of 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 breast ([place]) 12/14/2015  . Rheumatic fever 1950s   Past Surgical History:  Procedure Laterality Date  . BREAST BIOPSY Bilateral 2016  . CATARACT EXTRACTION W/ INTRAOCULAR LENS  IMPLANT, BILATERAL Bilateral 05/2015-06/2015  . CESAREAN SECTION  1974; 1976  . EYE MUSCLE SURGERY Bilateral 1950s   for cross eyes as a child  . HERNIA REPAIR    . INSERTION OF MESH N/A 09/12/2015   Procedure: INSERTION OF MESH;  Surgeon: Erroll Luna, MD;  Location: [place];  Service: General;  Laterality: N/A;  . MASTECTOMY COMPLETE / SIMPLE W/  SENTINEL NODE BIOPSY Right 01/25/2016  . MASTECTOMY MODIFIED RADICAL Left 01/25/2016  . MASTECTOMY W/ SENTINEL NODE BIOPSY Bilateral 01/25/2016   Procedure: RIGHT MASTECTOMY WITH SENTINEL RIGHT LYMPH NODE BIOPSY, LEFT MODIFIED RADICAL MASTECTOMY;  Surgeon: Stark Klein, MD;  Location: [facility];  Service: General;  Laterality: Bilateral;  . TONSILLECTOMY  1950s  . UMBILICAL HERNIA REPAIR N/A 09/12/2015   Procedure: REPAIR OF INCISIONAL AND UMBILICAL HERNIA;  Surgeon: Erroll Luna, MD;  Location: [place];  Service: General;  Laterality: N/A;     ALLERGIES:  No Known Allergies   CURRENT MEDICATIONS:  Outpatient Encounter Prescriptions as of 07/10/2016  Medication Sig  . anastrozole (ARIMIDEX) 1 MG tablet Take 1 tablet (1 mg total) by mouth daily. (Patient not taking: Reported on 05/27/2016)  . Artificial Tear Ointment (DRY EYES OP) Apply 1 drop to eye  daily as needed (for dry eyes).   . Calcium-Phosphorus-Vitamin D (CITRACAL +D3 PO) Take 2 tablets by mouth 2 (two) times daily.   . cyclobenzaprine (FLEXERIL) 10 MG tablet TAKE ONE TABLET BY MOUTH THREE TIMES DAILY AS NEEDED FOR MUSCLE SPASM  . gemfibrozil (LOPID) 600 MG tablet Take 1 tablet (600 mg total) by mouth 2 (two) times daily before a meal.  . hydrOXYzine (ATARAX/VISTARIL) 10 MG tablet Take 1 tablet (10 mg total) by mouth 3 (three) times daily as needed.  Marland Kitchen ibuprofen (ADVIL,MOTRIN) 200 MG tablet Take 400 mg by mouth 2 (two) times daily as needed for moderate pain.  Marland Kitchen losartan-hydrochlorothiazide (HYZAAR) 50-12.5 MG tablet Take 1 tablet by mouth daily.  . Multiple Vitamins-Minerals (MULTIVITAMIN WITH MINERALS) tablet Take 1 tablet by mouth daily. Reported on 07/21/7618  . non-metallic deodorant Jethro Poling) MISC Apply 1 application topically daily as needed.  . pantoprazole (PROTONIX) 40 MG tablet Take 1 tablet (40 mg total) by mouth daily.  . Wound Dressings (SONAFINE) Apply 1 application topically 2 (two) times daily.   No facility-administered  encounter medications on file as of 07/10/2016.      ONCOLOGIC FAMILY HISTORY:  Family History  Problem Relation Age of Onset  . Healthy Mother   . Other Mother     +"throat polyps"; +smoker  . Lymphoma Father     d. 74  . Diabetes Father   . Heart Problems Father   . Other Father     "tumors on his kidneys"  . Breast cancer Sister     dx. metachronous bilateral breast cancers at 56 and 34; - BRCA1/2 testing  . Cancer Maternal Grandmother 42    dx. spinal cancer  . Stroke Maternal Grandfather   . Gout Paternal Grandmother   . Gout Paternal Grandfather   . Lung cancer Maternal Uncle     d. 70-72; +smoker  . Diabetes Paternal Aunt   . Diabetes Paternal Uncle   . Breast cancer Cousin     maternal 1st cousin dx. unspecified age  . Lung cancer Cousin     maternal 1st cousin dx. lung cancer; +smoker  . Lupus Cousin   . Breast cancer Cousin     paternal 1st cousin dx. 86s  . Breast cancer Cousin     paternal 1st cousin dx. 68s-30s  . Cancer Cousin     paternal 1st cousin dx. oral cancer with metastasis, also "lost a leg to cancer"; dx. 41s; +EtOH abuse  . Breast cancer Cousin     paternal 1st cousin, once-removed dx. at unspecified age     GENETIC COUNSELING/TESTING: 04/16/16-Genetic testing: Normal. Genes analyzed: ATM, BARD1, BRCA1, BRCA2, BRIP1, CDH1, CHEK2, FANCC, MLH1, MSH2, MSH6, NBN, PALB2, PMS2, PTEN, RAD51C, RAD51D, TP53, and XRCC2   SOCIAL HISTORY:  Chizuko Trine is divorced and lives with her mother and 2 nephews in [place], [place].  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.  She is a former smoker; quit in 12/2015.  She denies any current tobacco, alcohol, or illicit drug use.     PHYSICAL EXAMINATION:  Vital Signs:   Vitals:   07/10/16 0932  BP: (!) 146/80  Pulse: 83  Resp: 19  Temp: 98.8 F (37.1 C)   Filed Weights   07/10/16 0932  Weight: 168 lb 6.4 oz (76.4 kg)    General: Well-nourished,  well-appearing female in no acute distress.  She is accompanied by her sister today.   HEENT:  Head is normocephalic.  Pupils equal and reactive to light and accomodation. Conjunctivae clear without exudate.  Sclerae anicteric. Oral mucosa is pink, moist.  Oropharynx is pink without lesions or erythema.  Lymph: No cervical, supraclavicular, or infraclavicular lymphadenopathy noted on palpation.  Cardiovascular: Regular rate and rhythm.Marland Kitchen Respiratory: Clear to auscultation bilaterally. Chest expansion symmetric; breathing non-labored.  GI: Abdomen soft and round; non-tender, non-distended. Bowel sounds normoactive. GU: Deferred.  Neuro: No focal deficits. Steady gait.  Psych: Mood and affect normal and appropriate for situation.  Extremities: No edema. Skin: Warm and dry. Mild erythema to upper chest with scant excoriation likely r/t scratching; no obvious desquamation.   LABORATORY DATA:  None for this visit.  DIAGNOSTIC IMAGING:  None for this visit.      ASSESSMENT AND PLAN:  Ms.. Liston is a pleasant 70 y.o. female with bilateral breast cancer including Stage IIIA left breast invasive ductal carcinoma, ER+/PR+/HER2-, & Stage IIB right breast invasive ductal carcinoma, ER+/PR+/HER2-, both diagnosed in 11/2015; treated with bilateral mastectomy, adjuvant radiation therapy, and anti-estrogen therapy with anastrazole beginning in 06/2016.  She presents to the Survivorship Clinic for our initial meeting and routine follow-up post-completion of treatment for breast cancer.    1. Bilateral breast cancer (Stage IIIA right breast & Stage IIB left breast):  Ms. Bragdon is continuing to recover from definitive treatment for breast cancer. She has some chest wall skin "tightness" and numbness, which I reassured her was normal at this point in her recovery.  I encouraged her to do more stretching and to massage the scar tissue, as this may provide some relief.  We discussed that numbness is completely  normal and she may or may not regain full sensation in her chest wall.  For surveillance, she will follow-up with her medical oncologist, Dr. Jana Hakim in 08/2016 with history and physical exam per protocol.  She will continue her anti-estrogen therapy with anastrazole as prescribed by Dr. Jana Hakim. Thus far, she is tolerating the medication well. She was instructed to make Dr. Jana Hakim or myself aware if she begins to experience any worsening side effects of the medication and I could see her back in clinic to help manage those side effects, as needed.  Today, a comprehensive survivorship care plan and treatment summary was reviewed with the patient today detailing her breast cancer diagnosis, treatment course, potential late/long-term effects of treatment, appropriate follow-up care with recommendations for the future, and patient education resources.  A copy of this summary, along with a letter will be sent to the patient's primary care provider via mail/fax/In Basket message after today's visit.    2. 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  I encouraged her to liberally apply her lotion with vitamin E; she could also try vitamin E oil which some patients find helpful in relieving the dry skin associated with healing skin that has been irradiated.  She could also try some OTC Benadryl, but she shouldn't take the Benadryl at the same time as the Atarax (previously prescribed by Dr. Jana Hakim); she did not find the Atrarax very helpful, so the Benadryl may not provide her much benefit either.  Likely keeping the skin hydrated/moist will be best as she continues to heal from radiation therapy.    3. Bone health:  Given Ms. Lembcke's age, history of breast cancer, and her current treatment regimen including anti-estrogen therapy with anastrazole, she is at risk for bone demineralization.  We do not have access to any recent DEXA  scan results. Therefore, I have placed  orders today so that we may have a baseline assessment of her bone density.  In the meantime, she was encouraged to increase her consumption of foods rich in calcium, as well as increase her weight-bearing activities.  She was given education on specific activities to promote bone health.  4. Cancer screening:  Due to Ms. Tirado's history and her age, she should receive screening for skin cancers, colon cancer, and gynecologic cancers.  The information and recommendations are listed on the patient's comprehensive care plan/treatment summary and were reviewed in detail with the patient.    5. Health maintenance and wellness promotion: Ms. Michelotti was encouraged to consume 5-7 servings of fruits and vegetables per day. We reviewed the "Nutrition Rainbow" handout, as well as the handout about "Nutrition for Breast Cancer Survivors."  She was also encouraged to engage in moderate to vigorous exercise for 30 minutes per day most days of the week. We discussed the [employer] fitness program, which is designed for cancer survivors to help them become more physically fit after cancer treatments.  She was commended on her efforts to abstain from cigarette smoking and encouraged to keep up the good work.     6. Support services/counseling: It is not uncommon for this period of the patient's cancer care trajectory to be one of many emotions and stressors.  We discussed an opportunity for her to participate in the next session of [facility] ("Finding Your New Normal") support group series designed for patients after they have completed treatment.   Ms. Ijames was encouraged to take advantage of our many other support services programs, support groups, and/or counseling in coping with her new life as a cancer survivor after completing anti-cancer treatment.  She was offered support today through active listening and expressive supportive counseling.  She was given information regarding our available services and  encouraged to contact me with any questions or for help enrolling in any of our support group/programs.    Dispo:   -Return to cancer center to see Dr. Jana Hakim in 08/2016.  -DEXA scan is due for baseline evaluation for aromatase inhibitor therapy; orders placed today.  -She is welcome to return back to the Survivorship Clinic at any time; no additional follow-up needed at this time.  -Consider referral back to survivorship as a long-term survivor for continued surveillance  A total of 60 minutes of face-to-face time was spent with this patient with greater than 50% of that time in counseling and care-coordination.   Mike Craze, NP Survivorship Program [facility] [phone]    Note: PRIMARY CARE PROVIDER Mauricio Po, [place] [phone]

## 2016-07-10 ENCOUNTER — Telehealth: Payer: Self-pay | Admitting: Adult Health

## 2016-07-10 ENCOUNTER — Encounter: Payer: Self-pay | Admitting: Adult Health

## 2016-07-10 ENCOUNTER — Ambulatory Visit (HOSPITAL_BASED_OUTPATIENT_CLINIC_OR_DEPARTMENT_OTHER): Payer: Medicare Other | Admitting: Adult Health

## 2016-07-10 VITALS — BP 146/80 | HR 83 | Temp 98.8°F | Resp 19 | Wt 168.4 lb

## 2016-07-10 DIAGNOSIS — C50912 Malignant neoplasm of unspecified site of left female breast: Principal | ICD-10-CM

## 2016-07-10 DIAGNOSIS — L299 Pruritus, unspecified: Secondary | ICD-10-CM

## 2016-07-10 DIAGNOSIS — C50212 Malignant neoplasm of upper-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 breast: Secondary | ICD-10-CM | POA: Diagnosis not present

## 2016-07-10 DIAGNOSIS — C50911 Malignant neoplasm of unspecified site of right female breast: Secondary | ICD-10-CM

## 2016-07-10 DIAGNOSIS — Z780 Asymptomatic menopausal state: Secondary | ICD-10-CM

## 2016-07-10 DIAGNOSIS — Z79811 Long term (current) use of aromatase inhibitors: Secondary | ICD-10-CM

## 2016-07-10 DIAGNOSIS — C50411 Malignant neoplasm of upper-outer quadrant of right female breast: Secondary | ICD-10-CM

## 2016-07-10 NOTE — Telephone Encounter (Signed)
appt made and avs printed °

## 2016-07-24 ENCOUNTER — Other Ambulatory Visit: Payer: Self-pay | Admitting: Family

## 2016-07-24 DIAGNOSIS — E781 Pure hyperglyceridemia: Secondary | ICD-10-CM

## 2016-07-24 DIAGNOSIS — I10 Essential (primary) hypertension: Secondary | ICD-10-CM

## 2016-07-26 ENCOUNTER — Ambulatory Visit
Admission: RE | Admit: 2016-07-26 | Discharge: 2016-07-26 | Disposition: A | Discharge status: Home / Self Care | Payer: Medicare Other | Source: Ambulatory Visit | Attending: Adult Health | Admitting: Adult Health

## 2016-07-26 DIAGNOSIS — Z780 Asymptomatic menopausal state: Secondary | ICD-10-CM | POA: Diagnosis not present

## 2016-07-26 DIAGNOSIS — M810 Age-related osteoporosis without current pathological fracture: Secondary | ICD-10-CM | POA: Diagnosis not present

## 2016-07-26 DIAGNOSIS — Z79811 Long term (current) use of aromatase inhibitors: Secondary | ICD-10-CM

## 2016-07-26 DIAGNOSIS — C50912 Malignant neoplasm of unspecified site of left female breast: Principal | ICD-10-CM

## 2016-07-26 DIAGNOSIS — C50911 Malignant neoplasm of unspecified site of right female breast: Secondary | ICD-10-CM

## 2016-07-31 ENCOUNTER — Encounter: Payer: Self-pay | Admitting: Gastroenterology

## 2016-07-31 ENCOUNTER — Ambulatory Visit (INDEPENDENT_AMBULATORY_CARE_PROVIDER_SITE_OTHER): Payer: Medicare Other | Admitting: Gastroenterology

## 2016-07-31 VITALS — BP 112/74 | HR 88 | Ht 63.0 in | Wt 162.5 lb

## 2016-07-31 DIAGNOSIS — Z1211 Encounter for screening for malignant neoplasm of colon: Secondary | ICD-10-CM | POA: Diagnosis not present

## 2016-07-31 DIAGNOSIS — K219 Gastro-esophageal reflux disease without esophagitis: Secondary | ICD-10-CM

## 2016-07-31 DIAGNOSIS — Z1212 Encounter for screening for malignant neoplasm of rectum: Secondary | ICD-10-CM | POA: Diagnosis not present

## 2016-07-31 MED ORDER — NA SULFATE-K SULFATE-MG SULF 17.5-3.13-1.6 GM/177ML PO SOLN: 1.0000 | Freq: Once | ORAL | 0 refills | Status: AC

## 2016-07-31 MED ORDER — PANTOPRAZOLE SODIUM 40 MG PO TBEC: 40.0000 mg | DELAYED_RELEASE_TABLET | Freq: Two times a day (BID) | ORAL | 11 refills | Status: DC

## 2016-07-31 NOTE — Progress Notes (Signed)
    History of Present Illness: This is a 70 year old female referred by Magrinat, Virgie Dad, MD for the evaluation of GERD and CRC screening. She has been treated for bilateral breast cancer with bilateral mastectomy, radiation therapy and anti-estrogen therapy. She relates a long history of GERD. She takes pantoprazole 40 mg daily with an adequate symptom control. She frequently has evening and nocturnal breakthrough symptoms. She is not previously had EGD or colonoscopy. Denies weight loss, abdominal pain, constipation, diarrhea, change in stool caliber, melena, hematochezia, nausea, vomiting, dysphagia, chest pain.   Review of Systems: Pertinent positive and negative review of systems were noted in the above HPI section. All other review of systems were otherwise negative.  Current Medications, Allergies, Past Medical History, Past Surgical History, Family History and Social History were reviewed in [employer] record.  Physical Exam: General: Well developed, well nourished, no acute distress Head: Normocephalic and atraumatic Eyes:  sclerae anicteric, EOMI Ears: Normal auditory acuity Mouth: No deformity or lesions Neck: Supple, no masses or thyromegaly Lungs: Clear throughout to auscultation Heart: Regular rate and rhythm; no murmurs, rubs or bruits Abdomen: Soft, non tender and non distended. No masses, hepatosplenomegaly or hernias noted. Normal Bowel sounds Rectal: deferred to colonoscopy Musculoskeletal: Symmetrical with no gross deformities  Skin: No lesions on visible extremities Pulses:  Normal pulses noted Extremities: No clubbing, cyanosis, edema or deformities noted Neurological: Alert oriented x 4, grossly nonfocal Cervical Nodes:  No significant cervical adenopathy Inguinal Nodes: No significant inguinal adenopathy Psychological:  Alert and cooperative. Normal mood and affect  Assessment and Recommendations:  1. Chronic GERD with inadequate  symptom control. Intensify antireflux measures. Increase pantoprazole to 40 mg twice daily. Schedule EGD to evaluate for esophagitis, Barrett's. The risks (including bleeding, perforation, infection, missed lesions, medication reactions and possible hospitalization or surgery if complications occur), benefits, and alternatives to endoscopy with possible biopsy and possible dilation were discussed with the patient and they consent to proceed.   2. CRC screening, average risk. Schedule colonoscopy. The risks (including bleeding, perforation, infection, missed lesions, medication reactions and possible hospitalization or surgery if complications occur), benefits, and alternatives to colonoscopy with possible biopsy and possible polypectomy were discussed with the patient and they consent to proceed.    cc: Chauncey Cruel, MD [address] [place], [place] [address]

## 2016-07-31 NOTE — Patient Instructions (Signed)
Increase your protonix 40 mg to one tablet by mouth twice daily. A new prescription has been sent to your pharmacy.   Patient advised to avoid spicy, acidic, citrus, chocolate, mints, fruit and fruit juices.  Limit the intake of caffeine, alcohol and Soda.  Don't exercise too soon after eating.  Don't lie down within 3-4 hours of eating.  Elevate the head of your bed.  You have been scheduled for an endoscopy and colonoscopy. Please follow the written instructions given to you at your visit today. Please pick up your prep supplies at the pharmacy within the next 1-3 days. If you use inhalers (even only as needed), please bring them with you on the day of your procedure. Your physician has requested that you go to www.startemmi.com and enter the access code given to you at your visit today. This web site gives a general overview about your procedure. However, you should still follow specific instructions given to you by our office regarding your preparation for the procedure.  Thank you for choosing me and [facility].  [name]. Dagoberto Ligas., MD., [facility] Regal

## 2016-08-21 ENCOUNTER — Other Ambulatory Visit (HOSPITAL_BASED_OUTPATIENT_CLINIC_OR_DEPARTMENT_OTHER): Payer: Medicare Other

## 2016-08-21 DIAGNOSIS — C50411 Malignant neoplasm of upper-outer quadrant of right female breast: Secondary | ICD-10-CM | POA: Diagnosis not present

## 2016-08-21 DIAGNOSIS — C50212 Malignant neoplasm of upper-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 breast: Secondary | ICD-10-CM

## 2016-08-21 DIAGNOSIS — Z720 Tobacco use: Secondary | ICD-10-CM

## 2016-08-21 LAB — COMPREHENSIVE METABOLIC PANEL
ALBUMIN: 3.4 g/dL — AB (ref 3.5–5.0)
ALK PHOS: 98 U/L (ref 40–150)
ALT: 16 U/L (ref 0–55)
AST: 17 U/L (ref 5–34)
Anion Gap: 11 mEq/L (ref 3–11)
BILIRUBIN TOTAL: 0.32 mg/dL (ref 0.20–1.20)
BUN: 19.3 mg/dL (ref 7.0–26.0)
CO2: 26 mEq/L (ref 22–29)
Calcium: 10.6 mg/dL — ABNORMAL HIGH (ref 8.4–10.4)
Chloride: 106 mEq/L (ref 98–109)
Creatinine: 0.9 mg/dL (ref 0.6–1.1)
EGFR: 64 mL/min/{1.73_m2} — ABNORMAL LOW (ref >90)
GLUCOSE: 169 mg/dL — AB (ref 70–140)
Potassium: 4.1 mEq/L (ref 3.5–5.1)
SODIUM: 143 meq/L (ref 136–145)
TOTAL PROTEIN: 7.7 g/dL (ref 6.4–8.3)

## 2016-08-21 LAB — CBC WITH DIFFERENTIAL/PLATELET
BASO%: 0.7 % (ref 0.0–2.0)
Basophils Absolute: 0 10*3/uL (ref 0.0–0.1)
EOS ABS: 0.3 10*3/uL (ref 0.0–0.5)
EOS%: 5.1 % (ref 0.0–7.0)
HCT: 37 % (ref 34.8–46.6)
HEMOGLOBIN: 12.3 g/dL (ref 11.6–15.9)
LYMPH%: 12.3 % — ABNORMAL LOW (ref 14.0–49.7)
MCH: 28.2 pg (ref 25.1–34.0)
MCHC: 33.2 g/dL (ref 31.5–36.0)
MCV: 85 fL (ref 79.5–101.0)
MONO#: 0.4 10*3/uL (ref 0.1–0.9)
MONO%: 7.6 % (ref 0.0–14.0)
NEUT%: 74.3 % (ref 38.4–76.8)
NEUTROS ABS: 3.9 10*3/uL (ref 1.5–6.5)
Platelets: 370 10*3/uL (ref 145–400)
RBC: 4.35 10*6/uL (ref 3.70–5.45)
RDW: 14.2 % (ref 11.2–14.5)
WBC: 5.3 10*3/uL (ref 3.9–10.3)
lymph#: 0.7 10*3/uL — ABNORMAL LOW (ref 0.9–3.3)

## 2016-08-28 ENCOUNTER — Telehealth: Payer: Self-pay | Admitting: Oncology

## 2016-08-28 ENCOUNTER — Ambulatory Visit (HOSPITAL_BASED_OUTPATIENT_CLINIC_OR_DEPARTMENT_OTHER): Payer: Medicare Other | Admitting: Oncology

## 2016-08-28 VITALS — BP 137/69 | HR 89 | Temp 98.1°F | Resp 18 | Ht 63.0 in | Wt 163.2 lb

## 2016-08-28 DIAGNOSIS — C773 Secondary and unspecified malignant neoplasm of axilla and upper limb lymph nodes: Secondary | ICD-10-CM

## 2016-08-28 DIAGNOSIS — F39 Unspecified mood [affective] disorder: Secondary | ICD-10-CM

## 2016-08-28 DIAGNOSIS — M810 Age-related osteoporosis without current pathological fracture: Secondary | ICD-10-CM | POA: Diagnosis not present

## 2016-08-28 DIAGNOSIS — C50412 Malignant neoplasm of upper-outer quadrant of left female breast: Secondary | ICD-10-CM

## 2016-08-28 DIAGNOSIS — K219 Gastro-esophageal reflux disease without esophagitis: Secondary | ICD-10-CM

## 2016-08-28 DIAGNOSIS — C50411 Malignant neoplasm of upper-outer quadrant of right female breast: Secondary | ICD-10-CM

## 2016-08-28 DIAGNOSIS — C50212 Malignant neoplasm of upper-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 breast: Secondary | ICD-10-CM

## 2016-08-28 MED ORDER — VENLAFAXINE HCL ER 37.5 MG PO CP24: 37.5000 mg | ORAL_CAPSULE | Freq: Every day | ORAL | 12 refills | Status: DC

## 2016-08-28 MED ORDER — METOCLOPRAMIDE HCL 5 MG PO TABS: 5.0000 mg | ORAL_TABLET | Freq: Three times a day (TID) | ORAL | 4 refills | Status: DC

## 2016-08-28 NOTE — Telephone Encounter (Signed)
appt made and avs printed °

## 2016-08-28 NOTE — Progress Notes (Signed)
[place]  [phone] [phone]     ID: Kristen Howe DOB: 05-26-1946  MR#: [id number]  JTT#:[id number]  Patient Care Team: Golden Circle, FNP as PCP - General (Family Medicine) Chauncey Cruel, MD as Consulting Physician (Oncology) Kyung Rudd, MD as Consulting Physician (Radiation Oncology) Stark Klein, MD as Consulting Physician (General Surgery) PCP: Mauricio Po, FNP GYN: OTHER MD:  CHIEF COMPLAINT: bilateral breast cancer  CURRENT TREATMENT: Anastrozole, denosumab/Prolia   BREAST CANCER HISTORY: From the original intake note:  "Kristen Howe" herself palpated a mass in her right breast sometime in November 2016. She brought it to Dr. Purcell Nails attention on 11/20/2015 and he set her up for bilateral diagnostic mammography with tomography and I lateral breast [facility]. The breast density was category B. In the right breast upper outer quadrant there where 2 nodules, at the 9:00 position (which by ultrasound was spiculated and measured 1.7 cm) and at the 11:00 position (which by ultrasound measured 0.7 cm. The right axilla showed a grossly abnormal lymph node measuring 1.4 cm with complete effacement of the hilum.there were several other small lymph nodes with cortical thickening in the lower right axilla as well.  Biopsy of the right breast mass on 12/12/2015 showed ([id number]) and invasive ductal carcinoma, grade 2, estrogen receptor 100% positive, progesterone receptor 80% positive, both with strong staining intensity, with an MIB-1 of 20%, and HER-2 nonamplified, with a signals ratio of 1.33 and number per cell 3.25.  In the left breast mammographically there was a multifocal mass centered at the 12:00 location with suspicious calcifications. The processes measured 6.7 cm altogether. A dilated duct extended to the nipple from this area, containing microcalcifications. Ultrasonography of the left  breast found a multifocal fragmented mass superiorly, the main component being hypoechoic and irregular and tolerable than wide, measuring 2.1 cm.superior to that, there was a second mass measuring 1.9 cm and there were other suspicious solid nodules in the adjacent parenchyma. There was also a separate area of microcalcifications containing a suspicious nodule at the 1:00 position. This measured 0.8 cm. The ultrasound confirmed a dilated duct extending from the superior process to the nipple. An alteration of the left axilla showed a grossly abnormal lymph node measuring up to 1 cm and a second indeterminant lymph node.  Biopsy of the left breast 11:30 o'clock massshowed invasive ductal carcinoma, grade 2, estrogen receptor 100% positive, progesterone receptor 70% positive, with an MIB-1 of 20%, and no HER-2 amplification, the signals ratio being 1.59 and the number per cell 3.90. A second area in the breast described as retroareolar showed ductal carcinoma, possibly in situ. This was also estrogen receptor positive at 100%, progesterone receptor positive at 60%, with an MIB-1 of 5%, and HER-2 negative, with a signals ratio of 1.73 and number per cell 3.55. Biopsy of the left breast lymph node was positiveand the prognostic panel there was very similar to all the others, namely estrogen receptor 100% positive, progesterone receptor 40% positive, with an MIB-1 of 40%, and HER-2 no amplification, with a signals ratio of 1.52 and number per cell 3.85.  The patient's subsequent history is as detailed below.  INTERVAL HISTORY: Kristen Howe returns today for follow-up of her estrogen receptor positive breast cancers, accompanied by her sister. After completing her radiation and recovering from that, she started anastrozole on 06/20/2016. She has not had problems with hot flashes, insomnia, or weight gain, but she finds her mood is now more  labile. She cries easily. He feels bad psychologically. This is not all the time but  it does come on her at times. This is a new problem. She also has more vaginal dryness than before. She obtains a drug at a very good price.   REVIEW OF SYSTEMS: Kristen Howe is not exercising. She has continuing issues with reflux. Otherwise a detailed review of systems today was stable   PAST MEDICAL HISTORY: Past Medical History:  Diagnosis Date  . Anemia    as a child  . Arthritis    "lower back" (01/25/2016)  . Bilateral breast cancer ([place])    Archie Endo 01/25/2016  . GERD (gastroesophageal reflux disease)   . Hyperlipidemia   . Hypertension   . Leg cramps    takes Flexeril  . Migraine    "maybe monthly" (01/25/2016)  . Pneumonia ~ 2005 X 1  . Primary cancer of 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 breast ([facility]) 12/14/2015  . Rheumatic fever 1950s    PAST SURGICAL HISTORY: Past Surgical History:  Procedure Laterality Date  . BREAST BIOPSY Bilateral 2016  . CATARACT EXTRACTION W/ INTRAOCULAR LENS  IMPLANT, BILATERAL Bilateral 05/2015-06/2015  . CESAREAN SECTION  1974; 1976  . EYE MUSCLE SURGERY Bilateral 1950s   for cross eyes as a child  . INSERTION OF MESH N/A 09/12/2015   Procedure: INSERTION OF MESH;  Surgeon: Erroll Luna, MD;  Location: [facility];  Service: General;  Laterality: N/A;  . MASTECTOMY COMPLETE / SIMPLE W/ SENTINEL NODE BIOPSY Right 01/25/2016  . MASTECTOMY MODIFIED RADICAL Left 01/25/2016  . MASTECTOMY W/ SENTINEL NODE BIOPSY Bilateral 01/25/2016   Procedure: RIGHT MASTECTOMY WITH SENTINEL RIGHT LYMPH NODE BIOPSY, LEFT MODIFIED RADICAL MASTECTOMY;  Surgeon: Stark Klein, MD;  Location: [facility];  Service: General;  Laterality: Bilateral;  . TONSILLECTOMY  1950s  . UMBILICAL HERNIA REPAIR N/A 09/12/2015   Procedure: REPAIR OF INCISIONAL AND UMBILICAL HERNIA;  Surgeon: Erroll Luna, MD;  Location: [facility];  Service: General;  Laterality: N/A;    FAMILY HISTORY Family History  Problem Relation Age of Onset  . Healthy Mother   . Other Mother     +"throat polyps"; +smoker  . Lymphoma  Father     d. 54  . Diabetes Father   . Heart Problems Father   . Other Father     "tumors on his kidneys"  . Breast cancer Sister     dx. metachronous bilateral breast cancers at 86 and 72; - BRCA1/2 testing  . Cancer Maternal Grandmother 73    dx. spinal cancer  . Stroke Maternal Grandfather   . Gout Paternal Grandmother   . Gout Paternal Grandfather   . Lung cancer Maternal Uncle     d. 70-72; +smoker  . Diabetes Paternal Aunt   . Diabetes Paternal Uncle   . Breast cancer Cousin     maternal 1st cousin dx. unspecified age  . Lung cancer Cousin     maternal 1st cousin dx. lung cancer; +smoker  . Lupus Cousin   . Breast cancer Cousin     paternal 1st cousin dx. 19s  . Breast cancer Cousin     paternal 1st cousin dx. 75s-30s  . Cancer Cousin     paternal 1st cousin dx. oral cancer with metastasis, also "lost a leg to cancer"; dx. 20s; +EtOH abuse  . Breast cancer Cousin     paternal 1st cousin, once-removed dx. at unspecified age  the patient's father died at the age of 61 from  complications of diabetes. He had a history of lymphoma. The patient's mother is currently living at age 24. The patient's brother died in an automobile accident. The patient has a twin sister, Varney Biles. She has a history of bilateral breast cancers and did undergo genetic testing in November 2012, with no BRCA1 or 2 mutation found  GYNECOLOGIC HISTORY:  No LMP recorded. Patient is postmenopausal. Menarche age 42, first live birth age 67. The patient is GX P2. She stopped having periods approximately 1997. She did not take hormone replacement. She took oral contraceptives remotely for approximately 3 years, with no complications.  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 She is divorced and lives at home with her sister Joycelyn Schmid, with their mother, and with 2 nephews, Rush Landmark, who is not employed, and Corene Cornea, who is disabled. The patient has 3  grandchildren. She is not a church attender    ADVANCED DIRECTIVES: Not in place. At the initial clinic visit  the patient was given the appropriate forms to complete and notarize at her discretion.  HEALTH MAINTENANCE: Social History  Substance Use Topics  . Smoking status: Former Smoker    Packs/day: 1.00    Years: 55.00    Types: Cigarettes    Quit date: 12/24/2015  . Smokeless tobacco: Never Used  . Alcohol use No     Comment: 07/31/16 pt states she dosn't drink anymore,04/16/2016 "1/2 bottle wine per month" previously - not much now     Colonoscopy: never  PAP: 2010  Bone density: remote  Lipid panel:  No Known Allergies  Current Outpatient Prescriptions  Medication Sig Dispense Refill  . anastrozole (ARIMIDEX) 1 MG tablet Take 1 tablet (1 mg total) by mouth daily. 90 tablet 4  . Artificial Tear Ointment (DRY EYES OP) Apply 1 drop to eye daily as needed (for dry eyes).     . Calcium-Phosphorus-Vitamin D (CITRACAL +D3 PO) Take 2 tablets by mouth 2 (two) times daily.     . cyclobenzaprine (FLEXERIL) 10 MG tablet TAKE ONE TABLET BY MOUTH THREE TIMES DAILY AS NEEDED FOR MUSCLE SPASM 30 tablet 0  . gemfibrozil (LOPID) 600 MG tablet Take 1 tablet (600 mg total) by mouth 2 (two) times daily before a meal. Yearly physical w/labs are due must have appt for future refills 60 tablet 0  . ibuprofen (ADVIL,MOTRIN) 200 MG tablet Take 400 mg by mouth 2 (two) times daily as needed for moderate pain.    Marland Kitchen losartan-hydrochlorothiazide (HYZAAR) 50-12.5 MG tablet Take 1 tablet by mouth daily. Yearly physical w/labs are due must see Marya Amsler for refills 30 tablet 0  . metoCLOPramide (REGLAN) 5 MG tablet Take 1 tablet (5 mg total) by mouth 3 (three) times daily before meals. 90 tablet 4  . Multiple Vitamins-Minerals (MULTIVITAMIN WITH MINERALS) tablet Take 1 tablet by mouth daily. Reported on 12/20/2015    . pantoprazole (PROTONIX) 40 MG tablet Take 1 tablet (40 mg total) by mouth 2 (two) times daily before a  meal. 60 tablet 11  . venlafaxine XR (EFFEXOR-XR) 37.5 MG 24 hr capsule Take 1 capsule (37.5 mg total) by mouth daily with breakfast. 30 capsule 12   No current facility-administered medications for this visit.     OBJECTIVE: middle-aged white woman  Who appears stated age 43:   08/28/16 1017  BP: 137/69  Pulse: 89  Resp: 18  Temp: 98.1 F (36.7 C)  Body mass index is 28.91 kg/m.    ECOG FS:1 - Symptomatic but completely ambulatory  Sclerae unicteric, pupils round and equal Oropharynx clear and moist-- no thrush or other lesions No cervical or supraclavicular adenopathy Lungs no rales or rhonchi Heart regular rate and rhythm Abd soft, obese, nontender, positive bowel sounds MSK no focal spinal tenderness, no upper extremity lymphedema Neuro: nonfocal, well oriented, appropriate affect Breasts: Status post bilateral mastectomies. There is no evidence of chest wall recurrence. Both axillae are benign.  LAB RESULTS:  CMP     Component Value Date/Time   NA 143 08/21/2016 1048   K 4.1 08/21/2016 1048   CL 112 (H) 01/26/2016 0430   CO2 26 08/21/2016 1048   GLUCOSE 169 (H) 08/21/2016 1048   BUN 19.3 08/21/2016 1048   CREATININE 0.9 08/21/2016 1048   CALCIUM 10.6 (H) 08/21/2016 1048   PROT 7.7 08/21/2016 1048   ALBUMIN 3.4 (L) 08/21/2016 1048   AST 17 08/21/2016 1048   ALT 16 08/21/2016 1048   ALKPHOS 98 08/21/2016 1048   BILITOT 0.32 08/21/2016 1048   GFRNONAA >60 01/26/2016 0430   GFRAA >60 01/26/2016 0430    INo results found for: SPEP, UPEP  Lab Results  Component Value Date   WBC 5.3 08/21/2016   NEUTROABS 3.9 08/21/2016   HGB 12.3 08/21/2016   HCT 37.0 08/21/2016   MCV 85.0 08/21/2016   PLT 370 08/21/2016      Chemistry      Component Value Date/Time   NA 143 08/21/2016 1048   K 4.1 08/21/2016 1048   CL 112 (H) 01/26/2016 0430   CO2 26 08/21/2016 1048   BUN 19.3 08/21/2016 1048   CREATININE 0.9 08/21/2016 1048      Component Value Date/Time    CALCIUM 10.6 (H) 08/21/2016 1048   ALKPHOS 98 08/21/2016 1048   AST 17 08/21/2016 1048   ALT 16 08/21/2016 1048   BILITOT 0.32 08/21/2016 1048       No results found for: LABCA2  No components found for: LABCA125  No results for input(s): INR in the last 168 hours.  Urinalysis    Component Value Date/Time   COLORURINE YELLOW 01/17/2016 0910   APPEARANCEUR CLEAR 01/17/2016 0910   LABSPEC 1.022 01/17/2016 0910   PHURINE 6.0 01/17/2016 0910   GLUCOSEU NEGATIVE 01/17/2016 0910   HGBUR NEGATIVE 01/17/2016 0910   BILIRUBINUR NEGATIVE 01/17/2016 0910   KETONESUR NEGATIVE 01/17/2016 0910   PROTEINUR NEGATIVE 01/17/2016 0910   NITRITE NEGATIVE 01/17/2016 0910   LEUKOCYTESUR TRACE (A) 01/17/2016 0910    STUDIES: EXAM: DUAL X-RAY ABSORPTIOMETRY (DXA) FOR BONE MINERAL DENSITY  IMPRESSION: Referring Physician:  Holley Bouche  PATIENT: Name: Kristen Howe, Kristen Howe Patient ID: [id number] Birth Date: 1946/03/18 Height: 60.5 in. Sex: Female Measured: 07/26/2016 Weight: 161.0 lbs. Indications: Anastrazole, Breast Cancer History, Caucasian, Estrogen Deficient, Family Hist. (Parent hip fracture), Family History of Osteoporosis, Pantoprazole, Postmenopausal Fractures: Ankle Treatments: Calcium (E943.0), Vitamin D (E933.5)  ASSESSMENT: The BMD measured at AP Spine L1-L3 is 0.871 g/cm2 with a T-score of -2.5. This patient is considered osteoporotic according to [facility]) criteria. L- 4 was excluded due to degenerative changes.   ASSESSMENT: 70 y.o. BRCA negative [place] woman  (1) status post bilateral breast biopsies and left axillary lymph node biopsy 12/12/2015, showing  (a) on the Right upper-outer quadrant, a clinical mT1c N1, stage IIA, estrogen and progesterone receptor positive, HER-2 not amplified, with an MIB-1 of 20%  (b) on the left upper  inner quadrant a clinical mT2 pN1, stage IIB invasive ductal carcinoma, grade 2 over 3, estrogen and  progesterone receptor positive, HER-2 not amplified, with MIB-1 between 5 and 40%  (c) chest CT and bone scan 01/04/2016 showed no evidence of metastatic disease. They did suggest a large esophageal diverticulum  (2) Status post right mastectomy with sentinel lymph node sampling and left modified radical mastectomy 01/25/2016  (a) right side: pT2, pN1, stage IIB invasive ductal carcinoma, grade 2, with negative margins  (b) left side: pT3 pN1, stage IIIA invasive ductal carcinoma, grade 2, with negative margins  (c) both cancers were estrogen and progesterone receptor positive, HER-2 nonamplified, with MIB-1 between 5 and 20%   (3) Mammaprint on both breast cancers came back "low risk" predicting a 5 year distant metastasis free survival in the 96% range   (4) no chemotherapy and no right-sided axillary lymph node dissection recommended at conference 02/21/2016  (5) adjuvant radiation completed 05/06/2016   (6) anastrozole started 06/18/2016  (a) bone density 07/26/2016 shows osteoporosis with a T score of -2.5.  (7) genetics testing 04/26/2016 through the [facility] through [employer], MD) found no deleterious mutations in ATM, BARD1, BRCA1, BRCA2, BRIP1, CDH1, CHEK2, FANCC, MLH1, MSH2, MSH6, NBN, PALB2, PMS2, PTEN, RAD51C, RAD51D, TP53, and XRCC2. This panel also includes deletion/duplication analysis (without sequencing) for one gene, EPCAM.  PLAN: Kristen Howe is tolerating the anastrozole generally well except for problems with mood. I think she would benefit from venlafaxine and I wrote her for a very low dose. If after 2 or 3 weeks she finds that it hasn't really made any difference she will double the dose. If there is any problem with that of course she will call.  She does have a history of reflux. She is taking Protonix which is helping but not taking care of the problem. I went ahead and added metoclopramide 5 mg today so she could take that  before meals as needed. I'm hopeful that we'll take care of the problem for her. She is aware that it may cause diarrhea.  We discussed her osteoporosis. We went into the difference between Fosamax, zolendronate, and denosumab. Her sister is already on denosumab so Kristen Howe finds that the most acceptable alternative. I have set her up to start receiving the Prolia next week and of course she will repeat that every 6 months.  She will see me again 3 months from now. If everything is stable at that time we will brought in the follow-up interval.  Chauncey Cruel, MD   08/28/2016 10:40 AM Medical Oncology and Hematology [facility], [place] [address] Tel. [phone]    Fax. [phone]

## 2016-09-03 ENCOUNTER — Ambulatory Visit (INDEPENDENT_AMBULATORY_CARE_PROVIDER_SITE_OTHER): Payer: Medicare Other | Admitting: Family

## 2016-09-03 ENCOUNTER — Encounter: Payer: Self-pay | Admitting: Family

## 2016-09-03 VITALS — BP 140/84 | HR 62 | Temp 98.3°F | Resp 16 | Ht 63.0 in | Wt 165.0 lb

## 2016-09-03 DIAGNOSIS — E663 Overweight: Secondary | ICD-10-CM | POA: Insufficient documentation

## 2016-09-03 DIAGNOSIS — R7301 Impaired fasting glucose: Secondary | ICD-10-CM | POA: Diagnosis not present

## 2016-09-03 DIAGNOSIS — E785 Hyperlipidemia, unspecified: Secondary | ICD-10-CM | POA: Insufficient documentation

## 2016-09-03 DIAGNOSIS — E781 Pure hyperglyceridemia: Secondary | ICD-10-CM

## 2016-09-03 DIAGNOSIS — Z0000 Encounter for general adult medical examination without abnormal findings: Secondary | ICD-10-CM | POA: Diagnosis not present

## 2016-09-03 DIAGNOSIS — I10 Essential (primary) hypertension: Secondary | ICD-10-CM

## 2016-09-03 DIAGNOSIS — C50411 Malignant neoplasm of upper-outer quadrant of right female breast: Secondary | ICD-10-CM

## 2016-09-03 MED ORDER — LOSARTAN POTASSIUM-HCTZ 50-12.5 MG PO TABS: 1.0000 | ORAL_TABLET | Freq: Every day | ORAL | 1 refills | Status: DC

## 2016-09-03 MED ORDER — GEMFIBROZIL 600 MG PO TABS: 600.0000 mg | ORAL_TABLET | Freq: Two times a day (BID) | ORAL | 1 refills | Status: DC

## 2016-09-03 NOTE — Assessment & Plan Note (Signed)
Breast cancer currently managed by oncology and appears stable status post mastectomy and maintained on Arimidex. Continue to monitor with treatment with changes per oncology.

## 2016-09-03 NOTE — Assessment & Plan Note (Signed)
Previously noted to have elevated fasting blood sugars. Obtain A1c to rule out diabetes.

## 2016-09-03 NOTE — Patient Instructions (Addendum)
Thank you for choosing [facility].  SUMMARY AND INSTRUCTIONS:  Mychart login : [email or web address]   Medication:  Your prescription(s) have been submitted to your pharmacy or been printed and provided for you. Please take as directed and contact our office if you believe you are having problem(s) with the medication(s) or have any questions.  Labs:  Please stop by the lab on the lower level of the building for your blood work. Your results will be released to [place] (or called to you) after review, usually within 72 hours after test completion. If any changes need to be made, you will be notified at that same time.  1.) The lab is open from 7:30am to 5:30 pm Monday-Friday 2.) No appointment is necessary 3.) Fasting (if needed) is 6-8 hours after food and drink; black coffee and water are okay   Imaging / Radiology:  Please stop by radiology on the basement level of the building for your x-rays. Your results will be released to [place] (or called to you) after review, usually within 72 hours after test completion. If any treatments or changes are necessary, you will be notified at that same time.  Referrals:  Referrals have been made during this visit. You should expect to hear back from our schedulers in about 7-10 days in regards to establishing an appointment with the specialists we discussed.   Follow up:  If your symptoms worsen or fail to improve, please contact our office for further instruction, or in case of emergency go directly to the emergency room at the closest medical facility.   Health Maintenance, Female Adopting a healthy lifestyle and getting preventive care can go a long way to promote health and wellness. Talk with your health care provider about what schedule of regular examinations is right for you. This is a good chance for you to check in with your provider about disease prevention and staying healthy. In between checkups, there are plenty of things you can  do on your own. Experts have done a lot of research about which lifestyle changes and preventive measures are most likely to keep you healthy. Ask your health care provider for more information. WEIGHT AND DIET  Eat a healthy diet  Be sure to include plenty of vegetables, fruits, low-fat dairy products, and lean protein.  Do not eat a lot of foods high in solid fats, added sugars, or salt.  Get regular exercise. This is one of the most important things you can do for your health.  Most adults should exercise for at least 150 minutes each week. The exercise should increase your heart rate and make you sweat (moderate-intensity exercise).  Most adults should also do strengthening exercises at least twice a week. This is in addition to the moderate-intensity exercise.  Maintain a healthy weight  Body mass index (BMI) is a measurement that can be used to identify possible weight problems. It estimates body fat based on height and weight. Your health care provider can help determine your BMI and help you achieve or maintain a healthy weight.  For females 21 years of age and older:   A BMI below 18.5 is considered underweight.  A BMI of 18.5 to 24.9 is normal.  A BMI of 25 to 29.9 is considered overweight.  A BMI of 30 and above is considered obese.  Watch levels of cholesterol and blood lipids  You should start having your blood tested for lipids and cholesterol at 70 years of age, then have this test every 5  years.  You may need to have your cholesterol levels checked more often if:  Your lipid or cholesterol levels are high.  You are older than 71 years of age.  You are at high risk for heart disease.  CANCER SCREENING   Lung Cancer  Lung cancer screening is recommended for adults 107-30 years old who are at high risk for lung cancer because of a history of smoking.  A yearly low-dose CT scan of the lungs is recommended for people who:  Currently smoke.  Have quit  within the past 15 years.  Have at least a 30-pack-year history of smoking. A pack year is smoking an average of one pack of cigarettes a day for 1 year.  Yearly screening should continue until it has been 15 years since you quit.  Yearly screening should stop if you develop a health problem that would prevent you from having lung cancer treatment.  Breast Cancer  Practice breast self-awareness. This means understanding how your breasts normally appear and feel.  It also means doing regular breast self-exams. Let your health care provider know about any changes, no matter how small.  If you are in your 20s or 30s, you should have a clinical breast exam (CBE) by a health care provider every 1-3 years as part of a regular health exam.  If you are 50 or older, have a CBE every year. Also consider having a breast X-ray (mammogram) every year.  If you have a family history of breast cancer, talk to your health care provider about genetic screening.  If you are at high risk for breast cancer, talk to your health care provider about having an MRI and a mammogram every year.  Breast cancer gene (BRCA) assessment is recommended for women who have family members with BRCA-related cancers. BRCA-related cancers include:  Breast.  Ovarian.  Tubal.  Peritoneal cancers.  Results of the assessment will determine the need for genetic counseling and BRCA1 and BRCA2 testing. Cervical Cancer Your health care provider may recommend that you be screened regularly for cancer of the pelvic organs (ovaries, uterus, and vagina). This screening involves a pelvic examination, including checking for microscopic changes to the surface of your cervix (Pap test). You may be encouraged to have this screening done every 3 years, beginning at age 51.  For women ages 57-65, health care providers may recommend pelvic exams and Pap testing every 3 years, or they may recommend the Pap and pelvic exam, combined with  testing for human papilloma virus (HPV), every 5 years. Some types of HPV increase your risk of cervical cancer. Testing for HPV may also be done on women of any age with unclear Pap test results.  Other health care providers may not recommend any screening for nonpregnant women who are considered low risk for pelvic cancer and who do not have symptoms. Ask your health care provider if a screening pelvic exam is right for you.  If you have had past treatment for cervical cancer or a condition that could lead to cancer, you need Pap tests and screening for cancer for at least 20 years after your treatment. If Pap tests have been discontinued, your risk factors (such as having a new sexual partner) need to be reassessed to determine if screening should resume. Some women have medical problems that increase the chance of getting cervical cancer. In these cases, your health care provider may recommend more frequent screening and Pap tests. Colorectal Cancer  This type of cancer can  be detected and often prevented.  Routine colorectal cancer screening usually begins at 70 years of age and continues through 70 years of age.  Your health care provider may recommend screening at an earlier age if you have risk factors for colon cancer.  Your health care provider may also recommend using home test kits to check for hidden blood in the stool.  A small camera at the end of a tube can be used to examine your colon directly (sigmoidoscopy or colonoscopy). This is done to check for the earliest forms of colorectal cancer.  Routine screening usually begins at age 82.  Direct examination of the colon should be repeated every 5-10 years through 70 years of age. However, you may need to be screened more often if early forms of precancerous polyps or small growths are found. Skin Cancer  Check your skin from head to toe regularly.  Tell your health care provider about any new moles or changes in moles,  especially if there is a change in a mole's shape or color.  Also tell your health care provider if you have a mole that is larger than the size of a pencil eraser.  Always use sunscreen. Apply sunscreen liberally and repeatedly throughout the day.  Protect yourself by wearing long sleeves, pants, a wide-brimmed hat, and sunglasses whenever you are outside. HEART DISEASE, DIABETES, AND HIGH BLOOD PRESSURE   High blood pressure causes heart disease and increases the risk of stroke. High blood pressure is more likely to develop in:  People who have blood pressure in the high end of the normal range (130-139/85-89 mm Hg).  People who are overweight or obese.  People who are African American.  If you are 31-84 years of age, have your blood pressure checked every 3-5 years. If you are 55 years of age or older, have your blood pressure checked every year. You should have your blood pressure measured twice--once when you are at a hospital or clinic, and once when you are not at a hospital or clinic. Record the average of the two measurements. To check your blood pressure when you are not at a hospital or clinic, you can use:  An automated blood pressure machine at a pharmacy.  A home blood pressure monitor.  If you are between 3 years and 64 years old, ask your health care provider if you should take aspirin to prevent strokes.  Have regular diabetes screenings. This involves taking a blood sample to check your fasting blood sugar level.  If you are at a normal weight and have a low risk for diabetes, have this test once every three years after 70 years of age.  If you are overweight and have a high risk for diabetes, consider being tested at a younger age or more often. PREVENTING INFECTION  Hepatitis B  If you have a higher risk for hepatitis B, you should be screened for this virus. You are considered at high risk for hepatitis B if:  You were born in a country where hepatitis B is  common. Ask your health care provider which countries are considered high risk.  Your parents were born in a high-risk country, and you have not been immunized against hepatitis B (hepatitis B vaccine).  You have HIV or AIDS.  You use needles to inject street drugs.  You live with someone who has hepatitis B.  You have had sex with someone who has hepatitis B.  You get hemodialysis treatment.  You take certain  medicines for conditions, including cancer, organ transplantation, and autoimmune conditions. Hepatitis C  Blood testing is recommended for:  Everyone born from [birth year] through 1965.  Anyone with known risk factors for hepatitis C. Sexually transmitted infections (STIs)  You should be screened for sexually transmitted infections (STIs) including gonorrhea and chlamydia if:  You are sexually active and are younger than 70 years of age.  You are older than 70 years of age and your health care provider tells you that you are at risk for this type of infection.  Your sexual activity has changed since you were last screened and you are at an increased risk for chlamydia or gonorrhea. Ask your health care provider if you are at risk.  If you do not have HIV, but are at risk, it may be recommended that you take a prescription medicine daily to prevent HIV infection. This is called pre-exposure prophylaxis (PrEP). You are considered at risk if:  You are sexually active and do not regularly use condoms or know the HIV status of your partner(s).  You take drugs by injection.  You are sexually active with a partner who has HIV. Talk with your health care provider about whether you are at high risk of being infected with HIV. If you choose to begin PrEP, you should first be tested for HIV. You should then be tested every 3 months for as long as you are taking PrEP.  PREGNANCY   If you are premenopausal and you may become pregnant, ask your health care provider about preconception  counseling.  If you may become pregnant, take 400 to 800 micrograms (mcg) of folic acid every day.  If you want to prevent pregnancy, talk to your health care provider about birth control (contraception). OSTEOPOROSIS AND MENOPAUSE   Osteoporosis is a disease in which the bones lose minerals and strength with aging. This can result in serious bone fractures. Your risk for osteoporosis can be identified using a bone density scan.  If you are 79 years of age or older, or if you are at risk for osteoporosis and fractures, ask your health care provider if you should be screened.  Ask your health care provider whether you should take a calcium or vitamin D supplement to lower your risk for osteoporosis.  Menopause may have certain physical symptoms and risks.  Hormone replacement therapy may reduce some of these symptoms and risks. Talk to your health care provider about whether hormone replacement therapy is right for you.  HOME CARE INSTRUCTIONS   Schedule regular health, dental, and eye exams.  Stay current with your immunizations.   Do not use any tobacco products including cigarettes, chewing tobacco, or electronic cigarettes.  If you are pregnant, do not drink alcohol.  If you are breastfeeding, limit how much and how often you drink alcohol.  Limit alcohol intake to no more than 1 drink per day for nonpregnant women. One drink equals 12 ounces of beer, 5 ounces of wine, or 1 ounces of hard liquor.  Do not use street drugs.  Do not share needles.  Ask your health care provider for help if you need support or information about quitting drugs.  Tell your health care provider if you often feel depressed.  Tell your health care provider if you have ever been abused or do not feel safe at home.   This information is not intended to replace advice given to you by your health care provider. Make sure you discuss any questions you have  with your health care provider.   Document  Released: 06/17/2011 Document Revised: 12/23/2014 Document Reviewed: 11/03/2013 Elsevier Interactive Patient Education 2016 [place] Maintenance  Topic Date Due  . COLONOSCOPY  09/03/1996  . ZOSTAVAX  08/18/2017 (Originally 09/03/2006)  . Hepatitis C Screening  08/18/2017 (Originally 31-Oct-1946)  . PNA vac Low Risk Adult (1 of 2 - PCV13) 08/18/2017 (Originally 09/04/2011)  . INFLUENZA VACCINE  08/30/2017 (Originally 07/16/2016)  . TETANUS/TDAP  08/30/2017 (Originally 09/03/1965)  . MAMMOGRAM  12/11/2017  . DEXA SCAN  Completed   Advance Directive Advance directives are the legal documents that allow you to make choices about your health care and medical treatment if you cannot speak for yourself. Advance directives are a way for you to communicate your wishes to family, friends, and health care providers. The specified people can then convey your decisions about end-of-life care to avoid confusion if you should become unable to communicate. Ideally, the process of discussing and writing advance directives should happen over time rather than making decisions all at once. Advance directives can be modified as your situation changes, and you can change your mind at any time, even after you have signed the advance directives. Each state has its own laws regarding advance directives. You may want to check with your health care provider, attorney, or state representative about the law in your state. Below are some examples of advance directives. LIVING WILL A living will is a set of instructions documenting your wishes about medical care when you cannot care for yourself. It is used if you become: Terminally ill. Incapacitated. Unable to communicate. Unable to make decisions. Items to consider in your living will include: The use or non-use of life-sustaining equipment, such as dialysis machines and breathing machines (ventilators). A do not resuscitate (DNR) order, which is the  instruction not to use cardiopulmonary resuscitation (CPR) if breathing or heartbeat stops. Tube feeding. Withholding of food and fluids. Comfort (palliative) care when the goal becomes comfort rather than a cure. Organ and tissue donation. A living will does not give instructions about distribution of your money and property if you should pass away. It is advisable to seek the expert advice of a lawyer in drawing up a will regarding your possessions. Decisions about taxes, beneficiaries, and asset distribution will be legally binding. This process can relieve your family and friends of any burdens surrounding disputes or questions that may come up about the allocation of your assets. DO NOT RESUSCITATE (DNR) A do not resuscitate (DNR) order is a request to not have CPR in the event that your heart stops beating or you stop breathing. Unless given other instructions, a health care provider will try to help any patient whose heart has stopped or who has stopped breathing.  HEALTH CARE PROXY AND DURABLE POWER OF ATTORNEY FOR HEALTH CARE A health care proxy is a person (agent) appointed to make medical decisions for you if you cannot. Generally, people choose someone they know well and trust to represent their preferences when they can no longer do so. You should be sure to ask this person for agreement to act as your agent. An agent may have to exercise judgment in the event of a medical decision for which your wishes are not known. The durable power of attorney for health care is the legal document that names your health care proxy. Once written, it should be: Signed. Notarized. Dated. Copied. Witnessed. Incorporated into your medical record. You may also want to  appoint someone to manage your financial affairs if you cannot. This is called a durable power of attorney for finances. It is a separate legal document from the durable power of attorney for health care. You may choose the same person or  someone different from your health care proxy to act as your agent in financial matters.   This information is not intended to replace advice given to you by your health care provider. Make sure you discuss any questions you have with your health care provider.   Document Released: 03/10/2008 Document Revised: 12/07/2013 Document Reviewed: 04/21/2013 Elsevier Interactive Patient Education [employer].

## 2016-09-03 NOTE — Assessment & Plan Note (Signed)
Hypertension appears adequately controlled with current regimen and no adverse effects. Denies worse headache of life or symptoms of end organ damage. Continue current dosage of losartan-hydrochlorothiazide. Continue monitor blood pressure at home. Follow sodium diet.

## 2016-09-03 NOTE — Progress Notes (Addendum)
Subjective:    Patient ID: Kristen Howe, female    DOB: 1946/04/05, 70 y.o.   MRN: [id number]  Chief Complaint  Patient presents with  . CPE    not fasting    HPI:  Kristen Howe is a 70 y.o. female who presents today for a Medicare Annual Wellness/Physical exam.    1) Health Maintenance -   Diet - Averages about 3 meals per day consisting of a regular diet; Caffeine 1-2 cups per day; Occasional fast/processed foods  Exercise - Walking daily about 30 minutes  2) Preventative Exams / Immunizations:  Dental -- Due for exam  Vision -- Up to date.    Health Maintenance  Topic Date Due  . COLONOSCOPY  09/03/1996  . ZOSTAVAX  08/18/2017 (Originally 09/03/2006)  . Hepatitis C Screening  08/18/2017 (Originally [birth year])  . PNA vac Low Risk Adult (1 of 2 - PCV13) 08/18/2017 (Originally 09/04/2011)  . INFLUENZA VACCINE  08/30/2017 (Originally 07/16/2016)  . TETANUS/TDAP  08/30/2017 (Originally 09/03/1965)  . MAMMOGRAM  12/11/2017  . DEXA SCAN  Completed    There is no immunization history on file for this patient.  RISK FACTORS  Tobacco History  Smoking Status  . Former Smoker  . Packs/day: 1.00  . Years: 0.00  . Types: Cigarettes  . Quit date: 12/24/2015  Smokeless Tobacco  . Never Used     Cardiac risk factors: advanced age (older than 52 for men, 85 for women), dyslipidemia and hypertension.  Depression Screen  Depression screen [facility] 2/9 09/03/2016  Decreased Interest 0  Down, Depressed, Hopeless 0  PHQ - 2 Score 0     Activities of Daily Living In your present state of health, do you have any difficulty performing the following activities?:  Driving? No Managing money?  No Feeding yourself? No Getting from bed to chair? No Climbing a flight of stairs? No Preparing food and eating?: No Bathing or showering? No Getting dressed: No Getting to the toilet? No Using the toilet: No Moving around from place to place: No In the past year have you fallen  or had a near fall?:No   Home Safety Has smoke detector and wears seat belts. No excess sun exposure. Are there smokers in your home (other than you)?  Yes Do you feel safe at home?  Yes   Hearing Difficulties: No Do you often ask people to speak up or repeat themselves? No Do you experience ringing or noises in your ears? No  Do you have difficulty understanding soft or whispered voices? No    Cognitive Testing  Alert? Yes   Normal Appearance? Yes  Oriented to person? Yes  Place? Yes   Time? Yes  Recall of three objects?  Yes  Can perform simple calculations? Yes  Displays appropriate judgment? Yes  Can read the correct time from a watch face? Yes  Do you feel that you have a problem with memory? No  Do you often misplace items? No   Advanced Directives have been discussed with the patient? Yes   Current Physicians/Providers and Suppliers  1. Terri Piedra, FNP - Internal Medicine 2. Lurline Del, MD - Oncology 3. Kyung Rudd, MD - Radiation Oncology  Indicate any recent Medical Services you may have received from other than [facility] providers in the past year (date may be approximate).  All answers were reviewed with the patient and necessary referrals were made:  Mauricio Po, [place]   09/03/2016    No Known Allergies   Outpatient Medications  Prior to Visit  Medication Sig Dispense Refill  . anastrozole (ARIMIDEX) 1 MG tablet Take 1 tablet (1 mg total) by mouth daily. 90 tablet 4  . Artificial Tear Ointment (DRY EYES OP) Apply 1 drop to eye daily as needed (for dry eyes).     . Calcium-Phosphorus-Vitamin D (CITRACAL +D3 PO) Take 2 tablets by mouth 2 (two) times daily.     . cyclobenzaprine (FLEXERIL) 10 MG tablet TAKE ONE TABLET BY MOUTH THREE TIMES DAILY AS NEEDED FOR MUSCLE SPASM 30 tablet 0  . ibuprofen (ADVIL,MOTRIN) 200 MG tablet Take 400 mg by mouth 2 (two) times daily as needed for moderate pain.    Marland Kitchen metoCLOPramide (REGLAN) 5 MG tablet Take 1 tablet (5 mg total)  by mouth 3 (three) times daily before meals. 90 tablet 4  . Multiple Vitamins-Minerals (MULTIVITAMIN WITH MINERALS) tablet Take 1 tablet by mouth daily. Reported on 12/20/2015    . pantoprazole (PROTONIX) 40 MG tablet Take 1 tablet (40 mg total) by mouth 2 (two) times daily before a meal. 60 tablet 11  . venlafaxine XR (EFFEXOR-XR) 37.5 MG 24 hr capsule Take 1 capsule (37.5 mg total) by mouth daily with breakfast. 30 capsule 12  . gemfibrozil (LOPID) 600 MG tablet Take 1 tablet (600 mg total) by mouth 2 (two) times daily before a meal. Yearly physical w/labs are due must have appt for future refills 60 tablet 0  . losartan-hydrochlorothiazide (HYZAAR) 50-12.5 MG tablet Take 1 tablet by mouth daily. Yearly physical w/labs are due must see Marya Amsler for refills 30 tablet 0   No facility-administered medications prior to visit.      Past Medical History:  Diagnosis Date  . Anemia    as a child  . Arthritis    "lower back" (01/25/2016)  . Bilateral breast cancer ([place])    Archie Endo 01/25/2016  . GERD (gastroesophageal reflux disease)   . Hyperlipidemia   . Hypertension   . Leg cramps    takes Flexeril  . Migraine    "maybe monthly" (01/25/2016)  . Pneumonia ~ 2005 X 1  . Primary cancer of 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 breast ([place]) 12/14/2015  . Rheumatic fever 1950s     Past Surgical History:  Procedure Laterality Date  . BREAST BIOPSY Bilateral 2016  . CATARACT EXTRACTION W/ INTRAOCULAR LENS  IMPLANT, BILATERAL Bilateral 05/2015-06/2015  . CESAREAN SECTION  1974; 1976  . EYE MUSCLE SURGERY Bilateral 1950s   for cross eyes as a child  . INSERTION OF MESH N/A 09/12/2015   Procedure: INSERTION OF MESH;  Surgeon: Erroll Luna, MD;  Location: [place];  Service: General;  Laterality: N/A;  . MASTECTOMY COMPLETE / SIMPLE W/ SENTINEL NODE BIOPSY Right 01/25/2016  . MASTECTOMY MODIFIED RADICAL Left 01/25/2016  . MASTECTOMY W/ SENTINEL NODE BIOPSY Bilateral 01/25/2016   Procedure: RIGHT MASTECTOMY WITH  SENTINEL RIGHT LYMPH NODE BIOPSY, LEFT MODIFIED RADICAL MASTECTOMY;  Surgeon: Stark Klein, MD;  Location: [place];  Service: General;  Laterality: Bilateral;  . TONSILLECTOMY  1950s  . UMBILICAL HERNIA REPAIR N/A 09/12/2015   Procedure: REPAIR OF INCISIONAL AND UMBILICAL HERNIA;  Surgeon: Erroll Luna, MD;  Location: [place] [facility];  Service: General;  Laterality: N/A;     Family History  Problem Relation Age of Onset  . Healthy Mother   . Other Mother     +"throat polyps"; +smoker  . Lymphoma Father     d. 59  . Diabetes Father   . Heart Problems Father   .  Other Father     "tumors on his kidneys"  . Breast cancer Sister     dx. metachronous bilateral breast cancers at 14 and 76; - BRCA1/2 testing  . Cancer Maternal Grandmother 29    dx. spinal cancer  . Stroke Maternal Grandfather   . Gout Paternal Grandmother   . Gout Paternal Grandfather   . Lung cancer Maternal Uncle     d. 70-72; +smoker  . Diabetes Paternal Aunt   . Diabetes Paternal Uncle   . Breast cancer Cousin     maternal 1st cousin dx. unspecified age  . Lung cancer Cousin     maternal 1st cousin dx. lung cancer; +smoker  . Lupus Cousin   . Breast cancer Cousin     paternal 1st cousin dx. 5s  . Breast cancer Cousin     paternal 1st cousin dx. 63s-30s  . Cancer Cousin     paternal 1st cousin dx. oral cancer with metastasis, also "lost a leg to cancer"; dx. 75s; +EtOH abuse  . Breast cancer Cousin     paternal 1st cousin, once-removed dx. at unspecified age     Social History   Social History  . Marital status: Divorced    Spouse name: N/A  . Number of children: 2  . Years of education: 12   Occupational History  . retired    Social History Main Topics  . Smoking status: Former Smoker    Packs/day: 1.00    Years: 0.00    Types: Cigarettes    Quit date: 12/24/2015  . Smokeless tobacco: Never Used  . Alcohol use No     Comment: 07/31/16 pt states she dosn't drink anymore,04/16/2016 "1/2 bottle wine per  month" previously - not much now  . Drug use: No  . Sexual activity: No   Other Topics Concern  . Not on file   Social History Narrative   Fun: Internet; games, talk to people   Denies religious beliefs effecting health care.   Denies abuse and feels safe at home.      Review of Systems  Constitutional: Denies fever, chills, fatigue, or significant weight gain/loss. HENT: Head: Denies headache or neck pain Ears: Denies changes in hearing, ringing in ears, earache, drainage Nose: Denies discharge, stuffiness, itching, nosebleed, sinus pain Throat: Denies sore throat, hoarseness, dry mouth, sores, thrush Eyes: Denies loss/changes in vision, pain, redness, blurry/double vision, flashing lights Cardiovascular: Denies chest pain/discomfort, tightness, palpitations, shortness of breath with activity, difficulty lying down, swelling, sudden awakening with shortness of breath Respiratory: Denies shortness of breath, cough, sputum production, wheezing Gastrointestinal: Denies dysphasia, heartburn, change in appetite, nausea, change in bowel habits, rectal bleeding, constipation, diarrhea, yellow skin or eyes Genitourinary: Denies frequency, urgency, burning/pain, blood in urine, incontinence, change in urinary strength. Musculoskeletal: Denies muscle/joint pain, stiffness, back pain, redness or swelling of joints, trauma Skin: Denies rashes, lumps, itching, dryness, color changes, or hair/nail changes Neurological: Denies dizziness, fainting, seizures, weakness, numbness, tingling, tremor Psychiatric - Denies nervousness, stress, depression or memory loss Endocrine: Denies heat or cold intolerance, sweating, frequent urination, excessive thirst, changes in appetite Hematologic: Denies ease of bruising or bleeding    Objective:     BP 140/84 (BP Location: Left Arm, Patient Position: Sitting, Cuff Size: Normal)   Pulse 62   Temp 98.3 F (36.8 C) (Oral)   Resp 16   Ht '5\' 3"'$  (1.6 m)   Wt  165 lb (74.8 kg)   SpO2 94%   BMI 29.23 kg/m  Nursing note and vital signs reviewed.  Physical Exam  Constitutional: She is oriented to person, place, and time. She appears well-developed and well-nourished. No distress.  Cardiovascular: Normal rate, regular rhythm, normal heart sounds and intact distal pulses.   Pulmonary/Chest: Effort normal and breath sounds normal.  Neurological: She is alert and oriented to person, place, and time.  Skin: Skin is warm and dry.  Psychiatric: She has a normal mood and affect. Her behavior is normal. Judgment and thought content normal.       Assessment & Plan:   During the course of the visit the patient was educated and counseled about appropriate screening and preventive services including:    Pneumococcal vaccine   Influenza vaccine  Td vaccine  Colorectal cancer screening  Diabetes screening  Nutrition counseling   Diet review for nutrition referral? Yes ____  Not Indicated _X___   Patient Instructions (the written plan) was given to the patient.  Medicare Attestation I have personally reviewed: The patient's medical and social history Their use of alcohol, tobacco or illicit drugs Their current medications and supplements The patient's functional ability including ADLs,fall risks, home safety risks, cognitive, and hearing and visual impairment Diet and physical activities Evidence for depression or mood disorders  The patient's weight, height, BMI,  have been recorded in the chart.  I have made referrals, counseling, and provided education to the patient based on review of the above and I have provided the patient with a written personalized care plan for preventive services.     Problem List Items Addressed This Visit      Cardiovascular and Mediastinum   Essential hypertension    Hypertension appears adequately controlled with current regimen and no adverse effects. Denies worse headache of life or symptoms of end organ  damage. Continue current dosage of losartan-hydrochlorothiazide. Continue monitor blood pressure at home. Follow sodium diet.      Relevant Medications   losartan-hydrochlorothiazide (HYZAAR) 50-12.5 MG tablet   gemfibrozil (LOPID) 600 MG tablet     Endocrine   Elevated fasting blood sugar    Previously noted to have elevated fasting blood sugars. Obtain A1c to rule out diabetes.      Relevant Orders   Hemoglobin A1c     Other   Breast cancer of upper-outer quadrant of right female breast (HCC)    Breast cancer currently managed by oncology and appears stable status post mastectomy and maintained on Arimidex. Continue to monitor with treatment with changes per oncology.       Hypertriglyceridemia    Currently maintained on medication due for lipid profile. Lab work ordered. Continue current dosage of gemfibrozil pending lipid profile results.      Relevant Medications   losartan-hydrochlorothiazide (HYZAAR) 50-12.5 MG tablet   gemfibrozil (LOPID) 600 MG tablet   Other Relevant Orders   Lipid Profile   Medicare annual wellness visit, subsequent - Primary    Reviewed and updated patient's medical, surgical, family and social history. Medications and allergies were also reviewed. Basic screenings for depression, activities of daily living, hearing, cognition and safety were performed. Provider list was updated and health plan was provided to the patient.   1) Anticipatory Guidance: Discussed importance of wearing a seatbelt while driving and not texting while driving; changing batteries in smoke detector at least once annually; wearing suntan lotion when outside; eating a balanced and moderate diet; getting physical activity at least 30 minutes per day.  2) Immunizations / Screenings / Labs:  Declines tetanus, influenza, pneumococcal,  and Zostavax vaccinations. All other immunizations are up-to-date per recommendations. Due for a colon cancer screening with colonoscopy scheduled for  the end of the week. Due for a dental exam encouraged to be completed independently. All other screenings are up-to-date per recommendations. Obtain lipid profile.   Overall well exam with risk factors for cardiovascular disease including hypertriglyceridemia and essential hypertension. Chronic conditions appear adequately controlled with current medication regimens. Recommend weight loss of 5-10% of current body weight through nutrition and physical activity. Continue other healthy lifestyle behaviors and choices. Follow-up prevention exam in 1 year. Follow-up office visit pending blood work and for chronic conditions.       Overweight (BMI 25.0-29.9)    BMI of 29. Recommend weight loss of 5-10% of current body weight. Recommend increasing physical activity to 30 minutes of moderate level activity daily. Encourage nutritional intake that focuses on nutrient dense foods and is moderate, varied, and balanced and is low in saturated fats and processed/sugary foods. Continue to monitor.         Other Visit Diagnoses   None.      I have changed Ms. Lefeber's losartan-hydrochlorothiazide and gemfibrozil. I am also having her maintain her Calcium-Phosphorus-Vitamin D (CITRACAL +D3 PO), multivitamin with minerals, Artificial Tear Ointment (DRY EYES OP), ibuprofen, cyclobenzaprine, anastrozole, pantoprazole, metoCLOPramide, and venlafaxine XR.   Meds ordered this encounter  Medications  . losartan-hydrochlorothiazide (HYZAAR) 50-12.5 MG tablet    Sig: Take 1 tablet by mouth daily.    Dispense:  90 tablet    Refill:  1    Order Specific Question:   Supervising Provider    Answer:   Pricilla Holm A [[id number]]  . gemfibrozil (LOPID) 600 MG tablet    Sig: Take 1 tablet (600 mg total) by mouth 2 (two) times daily before a meal.    Dispense:  180 tablet    Refill:  1    Order Specific Question:   Supervising Provider    Answer:   Pricilla Holm A [[id number]]     Follow-up: Return if  symptoms worsen or fail to improve.   Mauricio Po, FNP

## 2016-09-03 NOTE — Assessment & Plan Note (Signed)
BMI of 29. Recommend weight loss of 5-10% of current body weight. Recommend increasing physical activity to 30 minutes of moderate level activity daily. Encourage nutritional intake that focuses on nutrient dense foods and is moderate, varied, and balanced and is low in saturated fats and processed/sugary foods. Continue to monitor.

## 2016-09-03 NOTE — Assessment & Plan Note (Signed)
Currently maintained on medication due for lipid profile. Lab work ordered. Continue current dosage of gemfibrozil pending lipid profile results.

## 2016-09-03 NOTE — Assessment & Plan Note (Addendum)
Reviewed and updated patient's medical, surgical, family and social history. Medications and allergies were also reviewed. Basic screenings for depression, activities of daily living, hearing, cognition and safety were performed. Provider list was updated and health plan was provided to the patient.   1) Anticipatory Guidance: Discussed importance of wearing a seatbelt while driving and not texting while driving; changing batteries in smoke detector at least once annually; wearing suntan lotion when outside; eating a balanced and moderate diet; getting physical activity at least 30 minutes per day.  2) Immunizations / Screenings / Labs:  Declines tetanus, influenza, pneumococcal, and Zostavax vaccinations. All other immunizations are up-to-date per recommendations. Due for a colon cancer screening with colonoscopy scheduled for the end of the week. Due for a dental exam encouraged to be completed independently. All other screenings are up-to-date per recommendations. Obtain lipid profile.   Overall well exam with risk factors for cardiovascular disease including hypertriglyceridemia and essential hypertension. Chronic conditions appear adequately controlled with current medication regimens. Recommend weight loss of 5-10% of current body weight through nutrition and physical activity. Continue other healthy lifestyle behaviors and choices. Follow-up prevention exam in 1 year. Follow-up office visit pending blood work and for chronic conditions.

## 2016-09-04 ENCOUNTER — Ambulatory Visit (HOSPITAL_BASED_OUTPATIENT_CLINIC_OR_DEPARTMENT_OTHER): Payer: Medicare Other

## 2016-09-04 ENCOUNTER — Other Ambulatory Visit (HOSPITAL_BASED_OUTPATIENT_CLINIC_OR_DEPARTMENT_OTHER): Payer: Medicare Other

## 2016-09-04 VITALS — BP 140/70 | HR 66 | Temp 98.4°F | Resp 18

## 2016-09-04 DIAGNOSIS — Z720 Tobacco use: Secondary | ICD-10-CM

## 2016-09-04 DIAGNOSIS — C50411 Malignant neoplasm of upper-outer quadrant of right female breast: Secondary | ICD-10-CM | POA: Diagnosis not present

## 2016-09-04 DIAGNOSIS — M810 Age-related osteoporosis without current pathological fracture: Secondary | ICD-10-CM

## 2016-09-04 LAB — CBC WITH DIFFERENTIAL/PLATELET
BASO%: 0.6 % (ref 0.0–2.0)
BASOS ABS: 0 10*3/uL (ref 0.0–0.1)
EOS%: 5.8 % (ref 0.0–7.0)
Eosinophils Absolute: 0.3 10*3/uL (ref 0.0–0.5)
HEMATOCRIT: 36.5 % (ref 34.8–46.6)
HGB: 12.1 g/dL (ref 11.6–15.9)
LYMPH#: 0.8 10*3/uL — AB (ref 0.9–3.3)
LYMPH%: 15.3 % (ref 14.0–49.7)
MCH: 28.3 pg (ref 25.1–34.0)
MCHC: 33.2 g/dL (ref 31.5–36.0)
MCV: 85.3 fL (ref 79.5–101.0)
MONO#: 0.4 10*3/uL (ref 0.1–0.9)
MONO%: 7.2 % (ref 0.0–14.0)
NEUT#: 3.7 10*3/uL (ref 1.5–6.5)
NEUT%: 71.1 % (ref 38.4–76.8)
PLATELETS: 311 10*3/uL (ref 145–400)
RBC: 4.28 10*6/uL (ref 3.70–5.45)
RDW: 14.1 % (ref 11.2–14.5)
WBC: 5.2 10*3/uL (ref 3.9–10.3)

## 2016-09-04 LAB — COMPREHENSIVE METABOLIC PANEL
ALT: 27 U/L (ref 0–55)
ANION GAP: 10 meq/L (ref 3–11)
AST: 25 U/L (ref 5–34)
Albumin: 3.5 g/dL (ref 3.5–5.0)
Alkaline Phosphatase: 104 U/L (ref 40–150)
BUN: 15.7 mg/dL (ref 7.0–26.0)
CALCIUM: 10.2 mg/dL (ref 8.4–10.4)
CHLORIDE: 108 meq/L (ref 98–109)
CO2: 22 meq/L (ref 22–29)
CREATININE: 1 mg/dL (ref 0.6–1.1)
EGFR: 59 mL/min/{1.73_m2} — AB (ref >90)
Glucose: 158 mg/dl — ABNORMAL HIGH (ref 70–140)
POTASSIUM: 3.9 meq/L (ref 3.5–5.1)
Sodium: 141 mEq/L (ref 136–145)
Total Bilirubin: 0.35 mg/dL (ref 0.20–1.20)
Total Protein: 7.6 g/dL (ref 6.4–8.3)

## 2016-09-04 MED ORDER — DENOSUMAB 60 MG/ML ~~LOC~~ SOLN
60.0000 mg | Freq: Once | SUBCUTANEOUS | Status: AC
  Administered 2016-09-04: 60 mg via SUBCUTANEOUS
  Filled 2016-09-04: qty 1

## 2016-09-04 NOTE — Patient Instructions (Signed)
Denosumab injection  What is this medicine?  DENOSUMAB (den oh sue mab) slows bone breakdown. Prolia is used to treat osteoporosis in women after menopause and in men. Xgeva is used to prevent bone fractures and other bone problems caused by cancer bone metastases. Xgeva is also used to treat giant cell tumor of the bone.  This medicine may be used for other purposes; ask your health care provider or pharmacist if you have questions.  What should I tell my health care provider before I take this medicine?  They need to know if you have any of these conditions:  -dental disease  -eczema  -infection or history of infections  -kidney disease or on dialysis  -low blood calcium or vitamin D  -malabsorption syndrome  -scheduled to have surgery or tooth extraction  -taking medicine that contains denosumab  -thyroid or parathyroid disease  -an unusual reaction to denosumab, other medicines, foods, dyes, or preservatives  -pregnant or trying to get pregnant  -breast-feeding  How should I use this medicine?  This medicine is for injection under the skin. It is given by a health care professional in a hospital or clinic setting.  If you are getting Prolia, a special MedGuide will be given to you by the pharmacist with each prescription and refill. Be sure to read this information carefully each time.  For Prolia, talk to your pediatrician regarding the use of this medicine in children. Special care may be needed. For Xgeva, talk to your pediatrician regarding the use of this medicine in children. While this drug may be prescribed for children as young as 13 years for selected conditions, precautions do apply.  Overdosage: If you think you have taken too much of this medicine contact a poison control center or emergency room at once.  NOTE: This medicine is only for you. Do not share this medicine with others.  What if I miss a dose?  It is important not to miss your dose. Call your doctor or health care professional if you are  unable to keep an appointment.  What may interact with this medicine?  Do not take this medicine with any of the following medications:  -other medicines containing denosumab  This medicine may also interact with the following medications:  -medicines that suppress the immune system  -medicines that treat cancer  -steroid medicines like prednisone or cortisone  This list may not describe all possible interactions. Give your health care provider a list of all the medicines, herbs, non-prescription drugs, or dietary supplements you use. Also tell them if you smoke, drink alcohol, or use illegal drugs. Some items may interact with your medicine.  What should I watch for while using this medicine?  Visit your doctor or health care professional for regular checks on your progress. Your doctor or health care professional may order blood tests and other tests to see how you are doing.  Call your doctor or health care professional if you get a cold or other infection while receiving this medicine. Do not treat yourself. This medicine may decrease your body's ability to fight infection.  You should make sure you get enough calcium and vitamin D while you are taking this medicine, unless your doctor tells you not to. Discuss the foods you eat and the vitamins you take with your health care professional.  See your dentist regularly. Brush and floss your teeth as directed. Before you have any dental work done, tell your dentist you are receiving this medicine.  Do   not become pregnant while taking this medicine or for 5 months after stopping it. Women should inform their doctor if they wish to become pregnant or think they might be pregnant. There is a potential for serious side effects to an unborn child. Talk to your health care professional or pharmacist for more information.  What side effects may I notice from receiving this medicine?  Side effects that you should report to your doctor or health care professional as soon as  possible:  -allergic reactions like skin rash, itching or hives, swelling of the face, lips, or tongue  -breathing problems  -chest pain  -fast, irregular heartbeat  -feeling faint or lightheaded, falls  -fever, chills, or any other sign of infection  -muscle spasms, tightening, or twitches  -numbness or tingling  -skin blisters or bumps, or is dry, peels, or red  -slow healing or unexplained pain in the mouth or jaw  -unusual bleeding or bruising  Side effects that usually do not require medical attention (Report these to your doctor or health care professional if they continue or are bothersome.):  -muscle pain  -stomach upset, gas  This list may not describe all possible side effects. Call your doctor for medical advice about side effects. You may report side effects to FDA at 1-800-FDA-1088.  Where should I keep my medicine?  This medicine is only given in a clinic, doctor's office, or other health care setting and will not be stored at home.  NOTE: This sheet is a summary. It may not cover all possible information. If you have questions about this medicine, talk to your doctor, pharmacist, or health care provider.      2016, [employer] Standard. (2012-06-01 12:37:47)

## 2016-09-06 ENCOUNTER — Encounter: Payer: Self-pay | Admitting: Gastroenterology

## 2016-09-06 ENCOUNTER — Ambulatory Visit (AMBULATORY_SURGERY_CENTER): Payer: Medicare Other | Admitting: Gastroenterology

## 2016-09-06 VITALS — BP 143/74 | HR 66 | Temp 100.0°F | Resp 13 | Ht 63.0 in | Wt 162.0 lb

## 2016-09-06 DIAGNOSIS — K621 Rectal polyp: Secondary | ICD-10-CM | POA: Diagnosis not present

## 2016-09-06 DIAGNOSIS — K219 Gastro-esophageal reflux disease without esophagitis: Secondary | ICD-10-CM

## 2016-09-06 DIAGNOSIS — Z1211 Encounter for screening for malignant neoplasm of colon: Secondary | ICD-10-CM

## 2016-09-06 DIAGNOSIS — D122 Benign neoplasm of ascending colon: Secondary | ICD-10-CM | POA: Diagnosis not present

## 2016-09-06 DIAGNOSIS — D120 Benign neoplasm of cecum: Secondary | ICD-10-CM

## 2016-09-06 MED ORDER — SODIUM CHLORIDE 0.9 % IV SOLN: 500.0000 mL | INTRAVENOUS | Status: DC

## 2016-09-06 NOTE — Progress Notes (Signed)
Pt has had bilateral breast cancer.  States it is ok to do IV and BP on left side of body

## 2016-09-06 NOTE — Op Note (Signed)
[place] Patient Name: Kristen Howe Procedure Date: 09/06/2016 9:48 AM MRN: [id number] Endoscopist: Ladene Artist , MD Age: 70 Referring MD:  Date of Birth: [birth year] Gender: Female Account #: [id number] Procedure:                Colonoscopy Indications:              Screening for colorectal malignant neoplasm, This                            is the patient's first colonoscopy Medicines:                Monitored Anesthesia Care Procedure:                Pre-Anesthesia Assessment:                           - Prior to the procedure, a History and Physical                            was performed, and patient medications and                            allergies were reviewed. The patient's tolerance of                            previous anesthesia was also reviewed. The risks                            and benefits of the procedure and the sedation                            options and risks were discussed with the patient.                            All questions were answered, and informed consent                            was obtained. Prior Anticoagulants: The patient has                            taken no previous anticoagulant or antiplatelet                            agents. ASA Grade Assessment: II - A patient with                            mild systemic disease. After reviewing the risks                            and benefits, the patient was deemed in                            satisfactory condition to undergo the procedure.  After obtaining informed consent, the colonoscope                            was passed under direct vision. Throughout the                            procedure, the patient's blood pressure, pulse, and                            oxygen saturations were monitored continuously. The                            Model PCF-H190DL [id number]) scope was introduced                            through the anus and  advanced to the the cecum,                            identified by appendiceal orifice and ileocecal                            valve. The ileocecal valve, appendiceal orifice,                            and rectum were photographed. The quality of the                            bowel preparation was good. The colonoscopy was                            performed without difficulty. The patient tolerated                            the procedure well. Scope In: 10:01:03 AM Scope Out: 10:24:16 AM Scope Withdrawal Time: 0 hours 20 minutes 8 seconds  Total Procedure Duration: 0 hours 23 minutes 13 seconds  Findings:                 A 25 mm polyp was found in the proximal ascending                            colon. The polyp was sessile. It was located                            partially on top of a fold and the majority of the                            polyp was behind the fold. visualization was                            challenging. Unable to visualize or position to use                            lift  on the proximal aspect of the polyp. The polyp                            was removed with a piecemeal technique using a hot                            snare. Resection and retrieval were complete. Area                            was tattooed with an injection of 2 mL of Spot                            (carbon black) both proximally and distally to the                            polypectomy site.                           Two sessile polyps were found in the rectum,                            ascending colon and mid ascending colon. The polyps                            were 5 to 7 mm in size. These polyps were removed                            with a cold snare. Resection and retrieval were                            complete.                           Multiple large-mouthed diverticula were found in                            the sigmoid colon and descending colon. There was                             no evidence of diverticular bleeding.                           Scattered medium-mouthed diverticula were found in                            the transverse colon. There was no evidence of                            diverticular bleeding.                           The exam was otherwise without abnormality on  direct and retroflexion views. Complications:            No immediate complications. Estimated blood loss:                            None. Estimated Blood Loss:     Estimated blood loss: none. Impression:               - One 25 mm polyp in the proximal ascending colon,                            removed piecemeal using a hot snare. Resected and                            retrieved.                           - Two 5 to 7 mm polyps in the rectum, in the                            ascending colon and in the mid ascending colon,                            removed with a cold snare. Resected and retrieved.                           - Moderate diverticulosis in the sigmoid colon and                            in the descending colon.                           - Mild diverticulosis in the transverse colon.                           - The examination was otherwise normal on direct                            and retroflexion views. Recommendation:           - Repeat colonoscopy in 6 months to review the                            polypectomy site.                           - Patient has a contact number available for                            emergencies. The signs and symptoms of potential                            delayed complications were discussed with the                            patient. Return to normal  activities tomorrow.                            Written discharge instructions were provided to the                            patient.                           - Resume previous diet.                           - Continue present  medications.                           - No aspirin, ibuprofen, naproxen, or other                            non-steroidal anti-inflammatory drugs for 2 weeks                            after polyp removal.                           - Await pathology results. Ladene Artist, MD 09/06/2016 10:47:54 AM This report has been signed electronically.

## 2016-09-06 NOTE — Patient Instructions (Addendum)
YOU HAD AN ENDOSCOPIC PROCEDURE TODAY AT [facility]:   Refer to the procedure report that was given to you for any specific questions about what was found during the examination.  If the procedure report does not answer your questions, please call your gastroenterologist to clarify.  If you requested that your care partner not be given the details of your procedure findings, then the procedure report has been included in a sealed envelope for you to review at your convenience later.  YOU SHOULD EXPECT: Some feelings of bloating in the abdomen. Passage of more gas than usual.  Walking can help get rid of the air that was put into your GI tract during the procedure and reduce the bloating. If you had a lower endoscopy (such as a colonoscopy or flexible sigmoidoscopy) you may notice spotting of blood in your stool or on the toilet paper. If you underwent a bowel prep for your procedure, you may not have a normal bowel movement for a few days.  Please Note:  You might notice some irritation and congestion in your nose or some drainage.  This is from the oxygen used during your procedure.  There is no need for concern and it should clear up in a day or so.  SYMPTOMS TO REPORT IMMEDIATELY:   Following lower endoscopy (colonoscopy or flexible sigmoidoscopy):  Excessive amounts of blood in the stool  Significant tenderness or worsening of abdominal pains  Swelling of the abdomen that is new, acute  Fever of 100F or higher   Following upper endoscopy (EGD)  Vomiting of blood or coffee ground material  New chest pain or pain under the shoulder blades  Painful or persistently difficult swallowing  New shortness of breath  Fever of 100F or higher  Black, tarry-looking stools  For urgent or emergent issues, a gastroenterologist can be reached at any hour by calling [phone].   DIET:  We do recommend a small meal at first, but then you may proceed to your regular diet.  Drink  plenty of fluids but you should avoid alcoholic beverages for 24 hours.  ACTIVITY:  You should plan to take it easy for the rest of today and you should NOT DRIVE or use heavy machinery until tomorrow (because of the sedation medicines used during the test).    FOLLOW UP: Our staff will call the number listed on your records the next business day following your procedure to check on you and address any questions or concerns that you may have regarding the information given to you following your procedure. If we do not reach you, we will leave a message.  However, if you are feeling well and you are not experiencing any problems, there is no need to return our call.  We will assume that you have returned to your regular daily activities without incident.  If any biopsies were taken you will be contacted by phone or by letter within the next 1-3 weeks.  Please call us at [phone] if you have not heard about the biopsies in 3 weeks.    SIGNATURES/CONFIDENTIALITY: You and/or your care partner have signed paperwork which will be entered into your electronic medical record.  These signatures attest to the fact that that the information above on your After Visit Summary has been reviewed and is understood.  Full responsibility of the confidentiality of this discharge information lies with you and/or your care-partner.   Handouts were given to your care partner on polyps, diverticulosis,  and a high fiber diet with liberal fluid intake. No aspirin, aspirin products,  ibuprofen, naproxen, advil, motrin, aleve, or other non-steroidal anti-inflammatory drugs for 14 days after polyp removal. You may resume your other current medications today. Await biopsy results. Dr. Lynne Leader nurse from the office will call you to set up appointment for esophageal manometry and an esophagram Please call if any questions or concerns.

## 2016-09-06 NOTE — Progress Notes (Signed)
Called to room to assist during endoscopic procedure.  Patient ID and intended procedure confirmed with present staff. Received instructions for my participation in the procedure from the performing physician.  

## 2016-09-06 NOTE — Progress Notes (Signed)
A/ox3 pleased with MAC, report to Annette RN 

## 2016-09-06 NOTE — Progress Notes (Signed)
No problems noted in the recovery room. maw 

## 2016-09-06 NOTE — Op Note (Signed)
[place] Patient Name: Kristen Howe Procedure Date: 09/06/2016 10:26 AM MRN: [id number] Endoscopist: Ladene Artist , MD Age: 70 Referring MD:  Date of Birth: November 29, 1946 Gender: Female Account #: [id number] Procedure:                Upper GI endoscopy Indications:              Suspected gastro-esophageal reflux disease Medicines:                Monitored Anesthesia Care Procedure:                Pre-Anesthesia Assessment:                           - Prior to the procedure, a History and Physical                            was performed, and patient medications and                            allergies were reviewed. The patient's tolerance of                            previous anesthesia was also reviewed. The risks                            and benefits of the procedure and the sedation                            options and risks were discussed with the patient.                            All questions were answered, and informed consent                            was obtained. Prior Anticoagulants: The patient has                            taken no previous anticoagulant or antiplatelet                            agents. ASA Grade Assessment: II - A patient with                            mild systemic disease. After reviewing the risks                            and benefits, the patient was deemed in                            satisfactory condition to undergo the procedure.                           After obtaining informed consent, the endoscope was  passed under direct vision. Throughout the                            procedure, the patient's blood pressure, pulse, and                            oxygen saturations were monitored continuously. The                            Model GIF-HQ190 [id number]) scope was introduced                            through the mouth, and advanced to the second part                            of  duodenum. The upper GI endoscopy was                            accomplished without difficulty. The patient                            tolerated the procedure well. Scope In: Scope Out: Findings:                 A non-bleeding diverticulum with a large opening                            (about 5 cm) and no stigmata of recent bleeding was                            found in the mid esophagus.                           The lumen of the middle third of the esophagus was                            moderately dilated.                           The exam of the esophagus was otherwise normal.                            Z-line at 38 cm                           The entire examined stomach was normal.                           The duodenal bulb and second portion of the                            duodenum were normal. Complications:            No immediate complications. Estimated Blood Loss:     Estimated blood loss: none. Impression:               -  Diverticulum in the mid esophagus.                           - Dilation in the middle third of the esophagus.                           - Normal stomach.                           - Normal duodenal bulb and second portion of the                            duodenum.                           - No specimens collected. Recommendation:           - Patient has a contact number available for                            emergencies. The signs and symptoms of potential                            delayed complications were discussed with the                            patient. Return to normal activities tomorrow.                            Written discharge instructions were provided to the                            patient.                           - Resume previous diet.                           - Continue present medications.                           - Perform ambulatory esophageal manometry at the                            next available  appointment after barium esophagram.                           - Perform an esophagram at the next available                            appointment. Ladene Artist, MD 09/06/2016 10:53:22 AM This report has been signed electronically.

## 2016-09-09 ENCOUNTER — Telehealth: Payer: Self-pay | Admitting: *Deleted

## 2016-09-09 NOTE — Telephone Encounter (Signed)
  Follow up Call-  Call back number 09/06/2016  Post procedure Call Back phone  # [phone]  Permission to leave phone message Yes  Some recent data might be hidden     Patient questions:  Do you have a fever, pain , or abdominal swelling? No. Pain Score  0 *  Have you tolerated food without any problems? Yes.    Have you been able to return to your normal activities? Yes.    Do you have any questions about your discharge instructions: Diet   No. Medications  No. Follow up visit  No.  Do you have questions or concerns about your Care? No.  Actions: * If pain score is 4 or above: No action needed, pain <4.

## 2016-09-20 ENCOUNTER — Other Ambulatory Visit (INDEPENDENT_AMBULATORY_CARE_PROVIDER_SITE_OTHER): Payer: Medicare Other

## 2016-09-20 ENCOUNTER — Encounter: Payer: Self-pay | Admitting: Gastroenterology

## 2016-09-20 DIAGNOSIS — C50411 Malignant neoplasm of upper-outer quadrant of right female breast: Secondary | ICD-10-CM

## 2016-09-20 DIAGNOSIS — R7989 Other specified abnormal findings of blood chemistry: Secondary | ICD-10-CM | POA: Diagnosis not present

## 2016-09-20 DIAGNOSIS — E781 Pure hyperglyceridemia: Secondary | ICD-10-CM

## 2016-09-20 DIAGNOSIS — R7301 Impaired fasting glucose: Secondary | ICD-10-CM | POA: Diagnosis not present

## 2016-09-20 DIAGNOSIS — Z720 Tobacco use: Secondary | ICD-10-CM | POA: Diagnosis not present

## 2016-09-20 LAB — LIPID PANEL
CHOLESTEROL: 196 mg/dL (ref 0–200)
HDL: 45 mg/dL (ref >39.00)
LDL Cholesterol: 128 mg/dL — ABNORMAL HIGH (ref 0–99)
NONHDL: 151.21
Total CHOL/HDL Ratio: 4
Triglycerides: 114 mg/dL (ref 0.0–149.0)
VLDL: 22.8 mg/dL (ref 0.0–40.0)

## 2016-09-20 LAB — HEMOGLOBIN A1C: HEMOGLOBIN A1C: 6.8 % — AB (ref 4.6–6.5)

## 2016-09-26 ENCOUNTER — Encounter: Payer: Self-pay | Admitting: Family

## 2016-10-25 ENCOUNTER — Encounter (HOSPITAL_COMMUNITY): Payer: Self-pay

## 2016-11-20 ENCOUNTER — Other Ambulatory Visit: Payer: Self-pay | Admitting: Family

## 2016-12-04 ENCOUNTER — Ambulatory Visit (HOSPITAL_BASED_OUTPATIENT_CLINIC_OR_DEPARTMENT_OTHER): Payer: Medicare Other | Admitting: Oncology

## 2016-12-04 ENCOUNTER — Other Ambulatory Visit (HOSPITAL_BASED_OUTPATIENT_CLINIC_OR_DEPARTMENT_OTHER): Payer: Medicare Other

## 2016-12-04 VITALS — BP 142/67 | HR 78 | Temp 98.4°F | Resp 18 | Ht 63.0 in | Wt 166.3 lb

## 2016-12-04 DIAGNOSIS — C50412 Malignant neoplasm of upper-outer quadrant of left female breast: Secondary | ICD-10-CM | POA: Diagnosis not present

## 2016-12-04 DIAGNOSIS — F329 Major depressive disorder, single episode, unspecified: Secondary | ICD-10-CM

## 2016-12-04 DIAGNOSIS — C773 Secondary and unspecified malignant neoplasm of axilla and upper limb lymph nodes: Secondary | ICD-10-CM

## 2016-12-04 DIAGNOSIS — C50212 Malignant neoplasm of upper-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 breast: Secondary | ICD-10-CM

## 2016-12-04 DIAGNOSIS — C50411 Malignant neoplasm of upper-outer quadrant of right female breast: Secondary | ICD-10-CM

## 2016-12-04 DIAGNOSIS — M810 Age-related osteoporosis without current pathological fracture: Secondary | ICD-10-CM | POA: Diagnosis not present

## 2016-12-04 DIAGNOSIS — K219 Gastro-esophageal reflux disease without esophagitis: Secondary | ICD-10-CM

## 2016-12-04 DIAGNOSIS — Z170 Estrogen receptor positive status [ER+]: Secondary | ICD-10-CM

## 2016-12-04 LAB — COMPREHENSIVE METABOLIC PANEL
ALBUMIN: 3.8 g/dL (ref 3.5–5.0)
ALK PHOS: 67 U/L (ref 40–150)
ALT: 22 U/L (ref 0–55)
AST: 22 U/L (ref 5–34)
Anion Gap: 9 mEq/L (ref 3–11)
BILIRUBIN TOTAL: 0.35 mg/dL (ref 0.20–1.20)
BUN: 14.9 mg/dL (ref 7.0–26.0)
CO2: 26 mEq/L (ref 22–29)
CREATININE: 0.8 mg/dL (ref 0.6–1.1)
Calcium: 9.4 mg/dL (ref 8.4–10.4)
Chloride: 107 mEq/L (ref 98–109)
EGFR: 70 mL/min/{1.73_m2} — ABNORMAL LOW (ref >90)
GLUCOSE: 102 mg/dL (ref 70–140)
POTASSIUM: 3.5 meq/L (ref 3.5–5.1)
SODIUM: 141 meq/L (ref 136–145)
TOTAL PROTEIN: 7.6 g/dL (ref 6.4–8.3)

## 2016-12-04 LAB — CBC WITH DIFFERENTIAL/PLATELET
BASO%: 0.4 % (ref 0.0–2.0)
Basophils Absolute: 0 10*3/uL (ref 0.0–0.1)
EOS%: 2 % (ref 0.0–7.0)
Eosinophils Absolute: 0.1 10*3/uL (ref 0.0–0.5)
HCT: 38 % (ref 34.8–46.6)
HEMOGLOBIN: 12.6 g/dL (ref 11.6–15.9)
LYMPH#: 0.9 10*3/uL (ref 0.9–3.3)
LYMPH%: 16.9 % (ref 14.0–49.7)
MCH: 28.6 pg (ref 25.1–34.0)
MCHC: 33.2 g/dL (ref 31.5–36.0)
MCV: 86.2 fL (ref 79.5–101.0)
MONO#: 0.5 10*3/uL (ref 0.1–0.9)
MONO%: 8.3 % (ref 0.0–14.0)
NEUT%: 72.4 % (ref 38.4–76.8)
NEUTROS ABS: 4 10*3/uL (ref 1.5–6.5)
Platelets: 313 10*3/uL (ref 145–400)
RBC: 4.41 10*6/uL (ref 3.70–5.45)
RDW: 14.4 % (ref 11.2–14.5)
WBC: 5.5 10*3/uL (ref 3.9–10.3)

## 2016-12-04 NOTE — Progress Notes (Signed)
[place]  Telephone:(336) [phone] Fax:(336) [phone]     ID: Kristen Howe DOB: [birth year]  MR#: [id number]  YCX#:[id number]  Patient Care Team: Golden Circle, FNP as PCP - General (Family Medicine) Chauncey Cruel, MD as Consulting Physician (Oncology) Kyung Rudd, MD as Consulting Physician (Radiation Oncology) Stark Klein, MD as Consulting Physician (General Surgery) PCP: Mauricio Po, FNP GYN: OTHER MD:  CHIEF COMPLAINT: bilateral breast cancer  CURRENT TREATMENT: Anastrozole, denosumab/Prolia   BREAST CANCER HISTORY: From the original intake note:  "Kristen Howe" herself palpated a mass in her right breast sometime in November 2016. She brought it to Dr. Purcell Nails attention on 11/20/2015 and he set her up for bilateral diagnostic mammography with tomography and I lateral breast [facility] 29 2015. The breast density was category B. In the right breast upper outer quadrant there where 2 nodules, at the 9:00 position (which by ultrasound was spiculated and measured 1.7 cm) and at the 11:00 position (which by ultrasound measured 0.7 cm. The right axilla showed a grossly abnormal lymph node measuring 1.4 cm with complete effacement of the hilum.there were several other small lymph nodes with cortical thickening in the lower right axilla as well.  Biopsy of the right breast mass on 12/12/2015 showed ([id number]) and invasive ductal carcinoma, grade 2, estrogen receptor 100% positive, progesterone receptor 80% positive, both with strong staining intensity, with an MIB-1 of 20%, and HER-2 nonamplified, with a signals ratio of 1.33 and number per cell 3.25.  In the left breast mammographically there was a multifocal mass centered at the 12:00 location with suspicious calcifications. The processes measured 6.7 cm altogether. A dilated duct extended to the nipple from this area, containing microcalcifications. Ultrasonography of the left  breast found a multifocal fragmented mass superiorly, the main component being hypoechoic and irregular and tolerable than wide, measuring 2.1 cm.superior to that, there was a second mass measuring 1.9 cm and there were other suspicious solid nodules in the adjacent parenchyma. There was also a separate area of microcalcifications containing a suspicious nodule at the 1:00 position. This measured 0.8 cm. The ultrasound confirmed a dilated duct extending from the superior process to the nipple. An alteration of the left axilla showed a grossly abnormal lymph node measuring up to 1 cm and a second indeterminant lymph node.  Biopsy of the left breast 11:30 o'clock massshowed invasive ductal carcinoma, grade 2, estrogen receptor 100% positive, progesterone receptor 70% positive, with an MIB-1 of 20%, and no HER-2 amplification, the signals ratio being 1.59 and the number per cell 3.90. A second area in the breast described as retroareolar showed ductal carcinoma, possibly in situ. This was also estrogen receptor positive at 100%, progesterone receptor positive at 60%, with an MIB-1 of 5%, and HER-2 negative, with a signals ratio of 1.73 and number per cell 3.55. Biopsy of the left breast lymph node was positiveand the prognostic panel there was very similar to all the others, namely estrogen receptor 100% positive, progesterone receptor 40% positive, with an MIB-1 of 40%, and HER-2 no amplification, with a signals ratio of 1.52 and number per cell 3.85.  The patient's subsequent history is as detailed below.  INTERVAL HISTORY: Kristen Howe returns today for follow-up of her breast cancers, accompanied by her sister. Kristen Howe  Is tolerating anasrozoe well. Hot flashes and vaginal dryness are not a major issue. She never developed the arthralgias or myalgias that many patients can experience on this medication. She obtains it at a  good price.  The patient's mother died witrhin the last 3 weeks. She fell, fractured her pelvis,  and developed pneumonia. She was assisted by Hospice but the family is not happy with the help they received.  REVIEW OF SYSTEMS: Kristen Howe is doing a little walking for exercise. She gets SOB climbing starirs but gets to the top w/o stopping. A detailed ROS today was otherwise stable  PAST MEDICAL HISTORY: Past Medical History:  Diagnosis Date  . Anemia    as a child  . Anxiety   . Arthritis    "lower back" (01/25/2016)  . Bilateral breast cancer ([place])    Archie Endo 01/25/2016  . Depression   . GERD (gastroesophageal reflux disease)   . Hyperlipidemia   . Hypertension   . Leg cramps    takes Flexeril  . Migraine    "maybe monthly" (01/25/2016)  . Osteoporosis   . Pneumonia ~ 2005 X 1  . Primary cancer of 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 breast ([place]) 12/14/2015  . Rheumatic fever 1950s    PAST SURGICAL HISTORY: Past Surgical History:  Procedure Laterality Date  . BREAST BIOPSY Bilateral 2016  . CATARACT EXTRACTION W/ INTRAOCULAR LENS  IMPLANT, BILATERAL Bilateral 05/2015-06/2015  . CESAREAN SECTION  1974; 1976  . EYE MUSCLE SURGERY Bilateral 1950s   for cross eyes as a child  . INSERTION OF MESH N/A 09/12/2015   Procedure: INSERTION OF MESH;  Surgeon: Erroll Luna, MD;  Location: [facility];  Service: General;  Laterality: N/A;  . MASTECTOMY COMPLETE / SIMPLE W/ SENTINEL NODE BIOPSY Right 01/25/2016  . MASTECTOMY MODIFIED RADICAL Left 01/25/2016  . MASTECTOMY W/ SENTINEL NODE BIOPSY Bilateral 01/25/2016   Procedure: RIGHT MASTECTOMY WITH SENTINEL RIGHT LYMPH NODE BIOPSY, LEFT MODIFIED RADICAL MASTECTOMY;  Surgeon: Stark Klein, MD;  Location: [facility];  Service: General;  Laterality: Bilateral;  . TONSILLECTOMY  1950s  . UMBILICAL HERNIA REPAIR N/A 09/12/2015   Procedure: REPAIR OF INCISIONAL AND UMBILICAL HERNIA;  Surgeon: Erroll Luna, MD;  Location: [facility];  Service: General;  Laterality: N/A;    FAMILY HISTORY Family History  Problem Relation Age of Onset  . Healthy Mother   . Other Mother      +"throat polyps"; +smoker  . Lymphoma Father     d. 71  . Diabetes Father   . Heart Problems Father   . Other Father     "tumors on his kidneys"  . Breast cancer Sister     dx. metachronous bilateral breast cancers at 67 and 18; - BRCA1/2 testing  . Cancer Maternal Grandmother 21    dx. spinal cancer  . Stroke Maternal Grandfather   . Gout Paternal Grandmother   . Gout Paternal Grandfather   . Lung cancer Maternal Uncle     d. 70-72; +smoker  . Diabetes Paternal Aunt   . Diabetes Paternal Uncle   . Breast cancer Cousin     maternal 1st cousin dx. unspecified age  . Lung cancer Cousin     maternal 1st cousin dx. lung cancer; +smoker  . Lupus Cousin   . Breast cancer Cousin     paternal 1st cousin dx. 76s  . Breast cancer Cousin     paternal 1st cousin dx. 31s-30s  . Cancer Cousin     paternal 1st cousin dx. oral cancer with metastasis, also "lost a leg to cancer"; dx. 58s; +EtOH abuse  . Breast cancer Cousin     paternal 1st cousin, once-removed dx. at unspecified age  .  Colon cancer Neg Hx   . Esophageal cancer Neg Hx   . Rectal cancer Neg Hx   . Stomach cancer Neg Hx   the patient's father died at the age of 49 from complications of diabetes. He had a history of lymphoma. The patient's mother is currently living at age 18. The patient's brother died in an automobile accident. The patient has a twin sister,  -Animator. She has a history of bilateral breast cancers and did undergo genetic testing in November 2012, with no BRCA1 or 2 mutation found  GYNECOLOGIC HISTORY:  No LMP recorded. Patient is postmenopausal. Menarche age 67, first live birth age 68. The patient is GX P2. She stopped having periods approximately 1997. She did not take hormone replacement. She took oral contraceptives remotely for approximately 3 years, with no complications.  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 She is divorced  and lives at home with her sister Joycelyn Schmid, with their mother, and with 2 nephews, Rush Landmark, who is not employed, and Corene Cornea, who is disabled. The patient has 3 grandchildren. She is not a church attender    ADVANCED DIRECTIVES: Not in place. At the initial clinic visit  the patient was given the appropriate forms to complete and notarize at her discretion.  HEALTH MAINTENANCE: Social History  Substance Use Topics  . Smoking status: Former Smoker    Packs/day: 1.00    Years: 0.00    Types: Cigarettes    Quit date: 12/24/2015  . Smokeless tobacco: Never Used  . Alcohol use No     Comment: 07/31/16 pt states she dosn't drink anymore,04/16/2016 "1/2 bottle wine per month" previously - not much now     Colonoscopy: 09/06/2016  PAP: 2010  Bone density: remote  Lipid panel:  No Known Allergies  Current Outpatient Prescriptions  Medication Sig Dispense Refill  . anastrozole (ARIMIDEX) 1 MG tablet Take 1 tablet (1 mg total) by mouth daily. 90 tablet 4  . Artificial Tear Ointment (DRY EYES OP) Apply 1 drop to eye daily as needed (for dry eyes).     . Calcium-Phosphorus-Vitamin D (CITRACAL +D3 PO) Take 2 tablets by mouth 2 (two) times daily.     . cyclobenzaprine (FLEXERIL) 10 MG tablet TAKE ONE TABLET BY MOUTH THREE TIMES DAILY AS NEEDED FOR MUSCLE SPASM 30 tablet 0  . gemfibrozil (LOPID) 600 MG tablet Take 1 tablet (600 mg total) by mouth 2 (two) times daily before a meal. 180 tablet 1  . ibuprofen (ADVIL,MOTRIN) 200 MG tablet Take 400 mg by mouth 2 (two) times daily as needed for moderate pain.    Marland Kitchen losartan-hydrochlorothiazide (HYZAAR) 50-12.5 MG tablet Take 1 tablet by mouth daily. 90 tablet 1  . metoCLOPramide (REGLAN) 5 MG tablet Take 1 tablet (5 mg total) by mouth 3 (three) times daily before meals. 90 tablet 4  . Multiple Vitamins-Minerals (MULTIVITAMIN WITH MINERALS) tablet Take 1 tablet by mouth daily. Reported on 12/20/2015    . pantoprazole (PROTONIX) 40 MG tablet Take 1 tablet (40 mg  total) by mouth 2 (two) times daily before a meal. 60 tablet 11  . venlafaxine XR (EFFEXOR-XR) 37.5 MG 24 hr capsule Take 1 capsule (37.5 mg total) by mouth daily with breakfast. 30 capsule 12   Current Facility-Administered Medications  Medication Dose Route Frequency Provider Last Rate Last Dose  . 0.9 %  sodium chloride infusion  500 mL Intravenous Continuous Norberto Sorenson  Sindy Guadeloupe, MD        OBJECTIVE: middle-aged white woman in no acute distress Vitals:   12/04/16 1317  BP: (!) 142/67  Pulse: 78  Resp: 18  Temp: 98.4 F (36.9 C)     Body mass index is 29.46 kg/m.    ECOG FS:1 - Symptomatic but completely ambulatory  Sclerae unicteric, EOMs intact Oropharynx clear and moist No cervical or supraclavicular adenopathy Lungs no rales or rhonchi Heart regular rate and rhythm Abd soft, obese, nontender, positive bowel sounds MSK no focal spinal tenderness, no upper extremity lymphedema Neuro: nonfocal, well oriented, appropriate affect Breasts: s/p bilateral mastectom,ies; no evidence of local recurrence   Breasts: Status post bilateral mastectomies. There is no evidence of chest wall recurrence. Both axillae are benign.  LAB RESULTS:  CMP     Component Value Date/Time   NA 141 12/04/2016 1300   K 3.5 12/04/2016 1300   CL 112 (H) 01/26/2016 0430   CO2 26 12/04/2016 1300   GLUCOSE 102 12/04/2016 1300   BUN 14.9 12/04/2016 1300   CREATININE 0.8 12/04/2016 1300   CALCIUM 9.4 12/04/2016 1300   PROT 7.6 12/04/2016 1300   ALBUMIN 3.8 12/04/2016 1300   AST 22 12/04/2016 1300   ALT 22 12/04/2016 1300   ALKPHOS 67 12/04/2016 1300   BILITOT 0.35 12/04/2016 1300   GFRNONAA >60 01/26/2016 0430   GFRAA >60 01/26/2016 0430    INo results found for: SPEP, UPEP  Lab Results  Component Value Date   WBC 5.5 12/04/2016   NEUTROABS 4.0 12/04/2016   HGB 12.6 12/04/2016   HCT 38.0 12/04/2016   MCV 86.2 12/04/2016   PLT 313 12/04/2016      Chemistry      Component Value Date/Time    NA 141 12/04/2016 1300   K 3.5 12/04/2016 1300   CL 112 (H) 01/26/2016 0430   CO2 26 12/04/2016 1300   BUN 14.9 12/04/2016 1300   CREATININE 0.8 12/04/2016 1300      Component Value Date/Time   CALCIUM 9.4 12/04/2016 1300   ALKPHOS 67 12/04/2016 1300   AST 22 12/04/2016 1300   ALT 22 12/04/2016 1300   BILITOT 0.35 12/04/2016 1300       No results found for: LABCA2  No components found for: LABCA125  No results for input(s): INR in the last 168 hours.  Urinalysis    Component Value Date/Time   COLORURINE YELLOW 01/17/2016 0910   APPEARANCEUR CLEAR 01/17/2016 0910   LABSPEC 1.022 01/17/2016 0910   PHURINE 6.0 01/17/2016 0910   GLUCOSEU NEGATIVE 01/17/2016 0910   HGBUR NEGATIVE 01/17/2016 0910   BILIRUBINUR NEGATIVE 01/17/2016 0910   KETONESUR NEGATIVE 01/17/2016 0910   PROTEINUR NEGATIVE 01/17/2016 0910   NITRITE NEGATIVE 01/17/2016 0910   LEUKOCYTESUR TRACE (A) 01/17/2016 0910    STUDIES: No results found.  ASSESSMENT: 70 y.o. BRCA negative [place] woman  (1) status post bilateral breast biopsies and left axillary lymph node biopsy 12/12/2015, showing  (a) on the Right upper-outer quadrant, a clinical mT1c N1, stage IIA, estrogen and progesterone receptor positive, HER-2 not amplified, with an MIB-1 of 20%  (b) on the left upper inner quadrant a clinical mT2 pN1, stage IIB invasive ductal carcinoma, grade 2 over 3, estrogen and progesterone receptor positive, HER-2 not amplified, with MIB-1 between 5 and 40%  (c) chest CT and bone scan 01/04/2016 showed no evidence of metastatic disease. They did suggest a large esophageal diverticulum  (2) Status post right mastectomy with  sentinel lymph node sampling and left modified radical mastectomy 01/25/2016  (a) right side: pT2, pN1, stage IIB invasive ductal carcinoma, grade 2, with negative margins  (b) left side: pT3 pN1, stage IIIA invasive ductal carcinoma, grade 2, with negative margins  (c) both cancers were  estrogen and progesterone receptor positive, HER-2 nonamplified, with MIB-1 between 5 and 20%   (3) Mammaprint on both breast cancers came back "low risk" predicting a 5 year distant metastasis free survival in the 96% range   (4) no chemotherapy and no right-sided axillary lymph node dissection recommended at conference 02/21/2016  (5) adjuvant radiation completed 05/06/2016   (6) anastrozole started 06/18/2016  (a) bone density 07/26/2016 shows osteoporosis with a T score of -2.5.  (b)  denosumab/Prolia started0/202017  (7) genetics testing 04/26/2016 through the [place] [facility] through [employer], MD) found no deleterious mutations in ATM, BARD1, BRCA1, BRCA2, BRIP1, CDH1, CHEK2, FANCC, MLH1, MSH2, MSH6, NBN, PALB2, PMS2, PTEN, RAD51C, RAD51D, TP53, and XRCC2. This panel also includes deletion/duplication analysis (without sequencing) for one gene, EPCAM.  PLAN: Kristen Howe is tolerating the anastrozole generally well except for problems with mood. I think she would benefit from venlafaxine and I wrote her for a very low dose. If after 2 or 3 weeks she finds that it hasn't really made any difference she will double the dose. If there is any problem with that of course she will call.  She does have a history of reflux. She is taking Protonix which is helping but not taking care of the problem. I went ahead and added metoclopramide 5 mg today so she could take that before meals as needed. I'm hopeful that we'll take care of the problem for her. She is aware that it may cause diarrhea.  We discussed her osteoporosis. We went into the difference between Fosamax, zolendronate, and denosumab. Her sister is already on denosumab so Kristen Howe finds that the most acceptable alternative. I have set her up to start receiving the Prolia next week and of course she will repeat that every 6 months.  She will see me again 3 months from now. If everything is stable at that time we  will brought in the follow-up interval.  Chauncey Cruel, MD   12/04/2016 2:05 PM Medical Oncology and Hematology [facility] [place], [place] [address] Tel. [phone]    Fax. [phone]

## 2017-01-08 IMAGING — CT CT CHEST W/ CM
2 of 3 series · 15 of 36 positions shown, 18 images · IV contrast (OMNIPAQUE)
Comparison: None.

CLINICAL DATA: Newly diagnosed right upper outer quadrant breast
carcinoma. Left axillary lymphadenopathy.

EXAM:
CT CHEST WITH CONTRAST
TECHNIQUE: Multidetector CT imaging of the chest was performed during
intravenous contrast administration.
CONTRAST:  75mL OMNIPAQUE IOHEXOL 300 MG/ML  SOLN

[Series 2: chest with st · axial · 0.67mm/px · z∈[-270,-30]mm · 12 of 58 slices shown, 15 images]
[im 5/58  mediastinal]
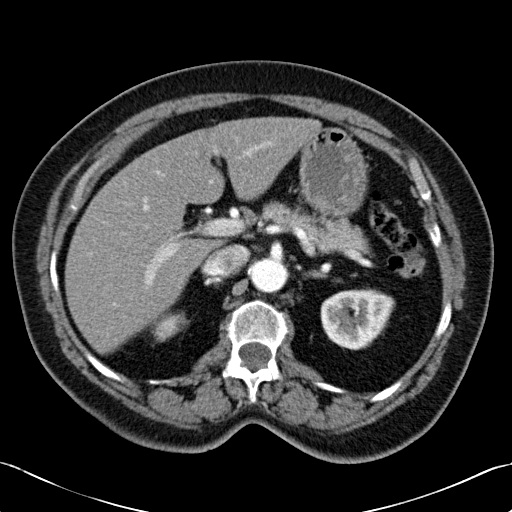
[im 5/58  lung]
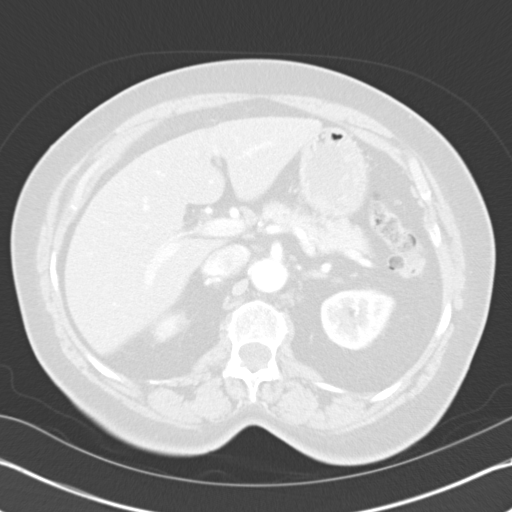
[im 9/58  lung]
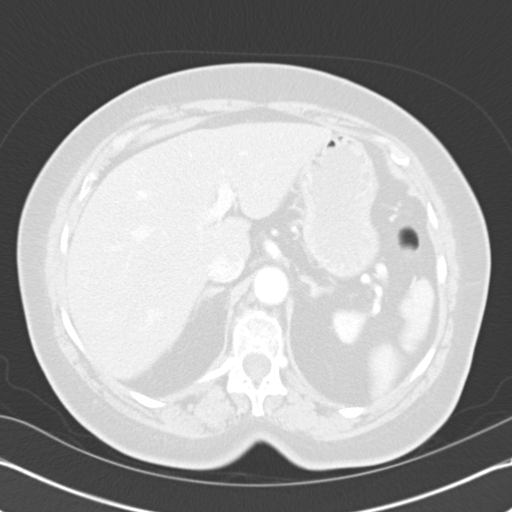
[im 13/58  lung]
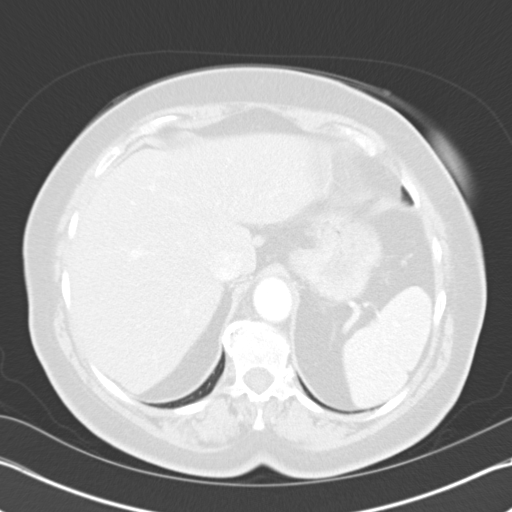
[im 17/58  lung]
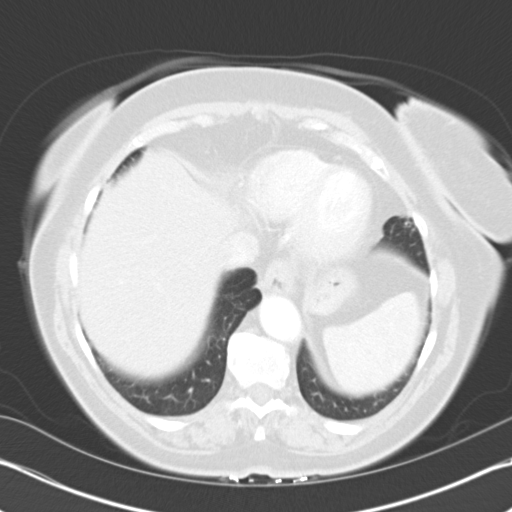
[im 22/58  mediastinal]
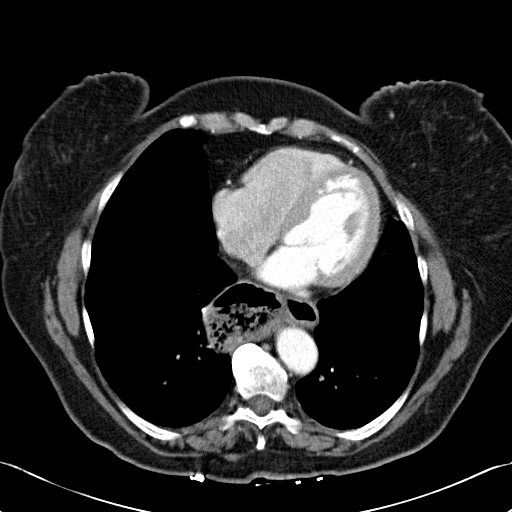
[im 22/58  lung]
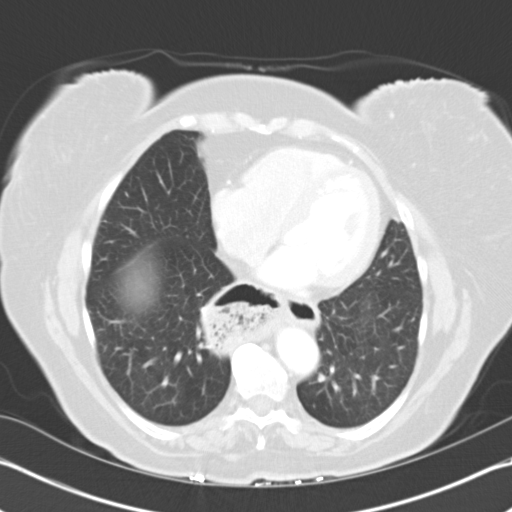
[im 26/58  lung]
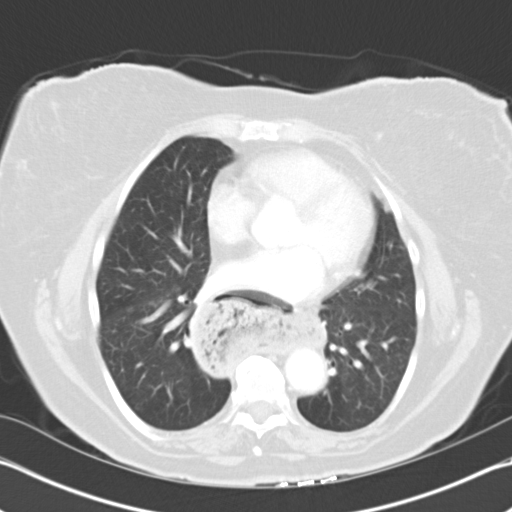
[im 32/58  lung]
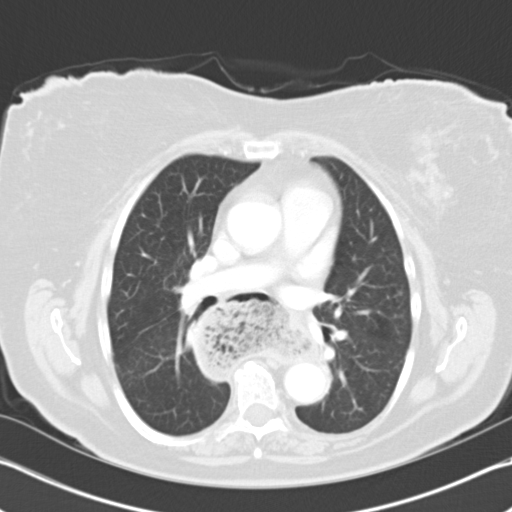
[im 36/58  lung]
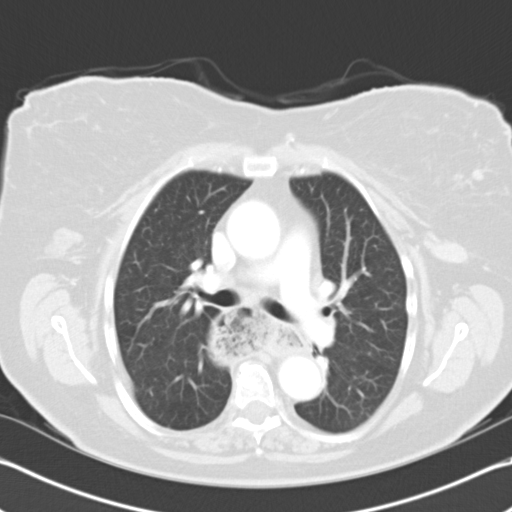
[im 41/58  mediastinal]
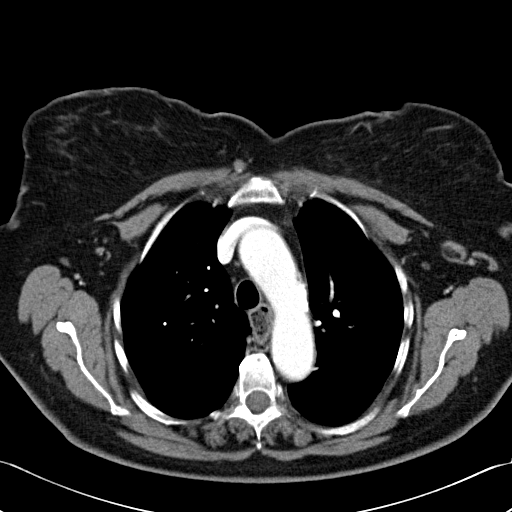
[im 41/58  lung]
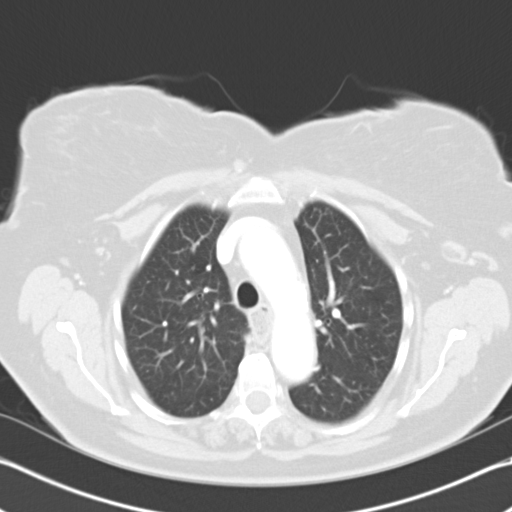
[im 45/58  lung]
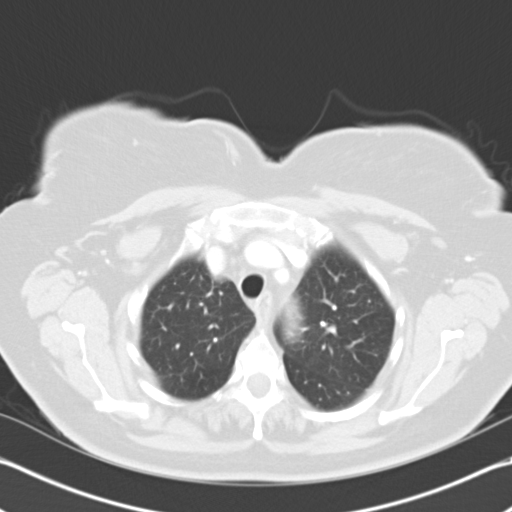
[im 49/58  lung]
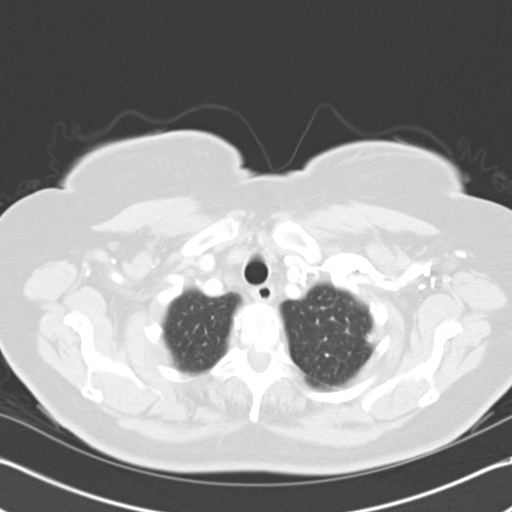
[im 53/58  lung]
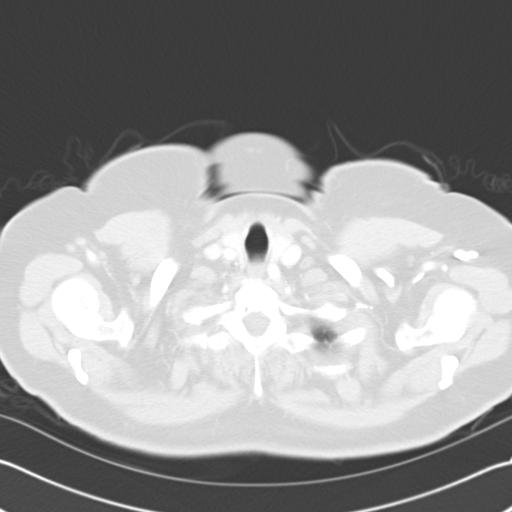

[Series 602: <mpr thick range> · coronal · 0.67mm/px · 3 of 93 slices shown]
[im 19/93  lung]
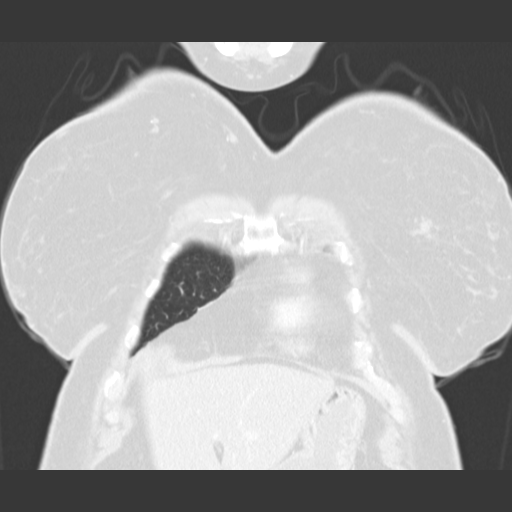
[im 37/93  lung]
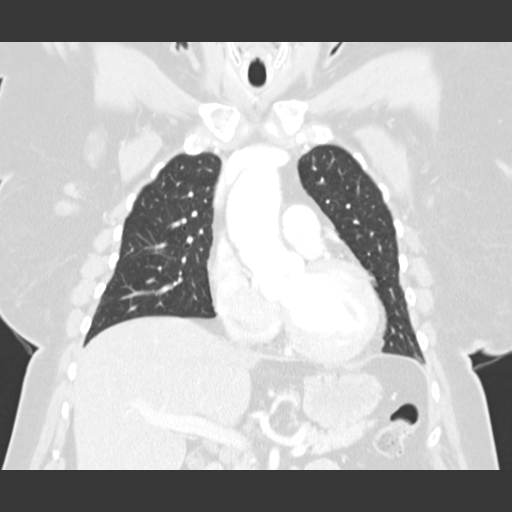
[im 56/93  lung]
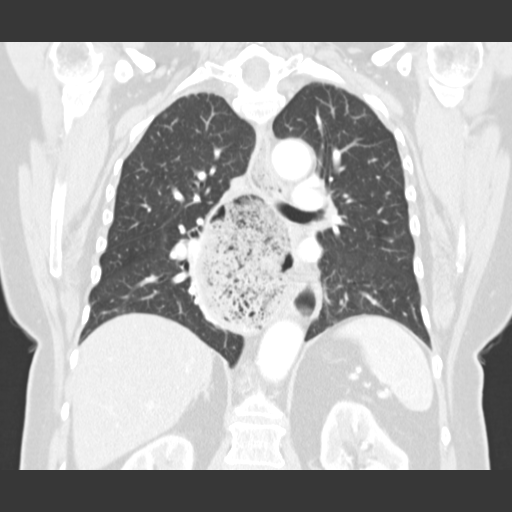

[15 of 36 positions shown; findings below may reference images not displayed]

FINDINGS: Mediastinum/Lymph Nodes: No hilar or mediastinal lymphadenopathy
identified. Mild bilateral axillary lymphadenopathy is seen. Largest
lymph node in the left axilla measures 15 mm in short axis on image
21/series 2. Largest lymph node in the right axilla measures 1.3 cm
on image 23/series 2.

A large collection containing gas and solid food stuff is seen in
the middle mediastinum just below the carina which appears
contiguous with the lumen of the lower thoracic esophagus which is
displaced to the left. This does not appear to originate from the
esophageal hiatus, and is suspicious for a giant esophageal
diverticulum containing food stuff, or less likely an enteric
duplication cyst. This measures 9.8 x 7.4 cm.

Lungs/Pleura: No pulmonary mass, infiltrate, or effusion.

Upper abdomen: No acute findings. Adrenal glands and visualized
portion of liver are unremarkable.

Musculoskeletal: A multi lobular mass is seen within the central
left breast which measures approximately 2.4 x 5.2 cm on image
26/series 2. There is also a irregular solid mass in the lateral
right breast measuring 1.9 x 1.5 cm on image 35.
IMPRESSION: Bilateral breast masses, suspicious for bilateral breast carcinomas.

Mild bilateral axillary lymphadenopathy, suspicious for metastatic
disease.

No other sites of metastatic disease identified within the thorax.

10 cm middle mediastinal lesion containing gas in solid food stuff,
which appears contiguous with the esophageal lumen. This is
suspicious for a giant esophageal diverticulum, or less likely a
enteric duplication cyst. Consider upper endoscopy or upper GI
series for further evaluation.

## 2017-01-08 IMAGING — NM NM BONE WHOLE BODY
2 series · 2 of 2 positions shown · non-contrast
Comparison: CT of the chest same day

CLINICAL DATA: Breast cancer, metastatic lymph nodes

EXAM:
NUCLEAR MEDICINE WHOLE BODY BONE SCAN
TECHNIQUE: Whole body anterior and posterior images were obtained approximately
3 hours after intravenous injection of radiopharmaceutical.
RADIOPHARMACEUTICALS:  26.0 mCi 5echnetium-KKm MDP IV

[Series 1: whole body · 2.66mm/px · 1 of 1 slices shown (1 of 2)]
[im 1/1]
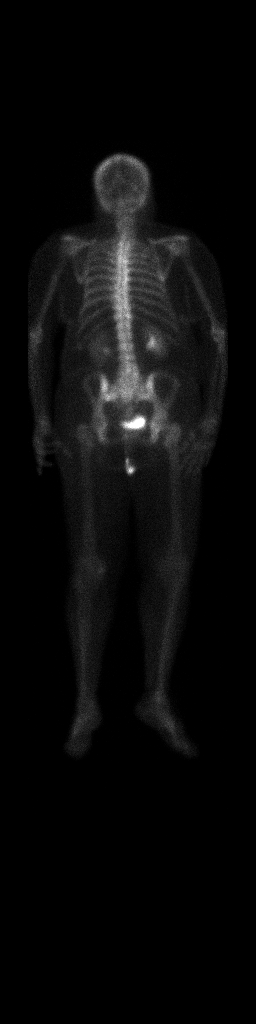

[Series 1: whole body · 2.66mm/px · 1 of 1 slices shown (2 of 2)]
[im 1/1]
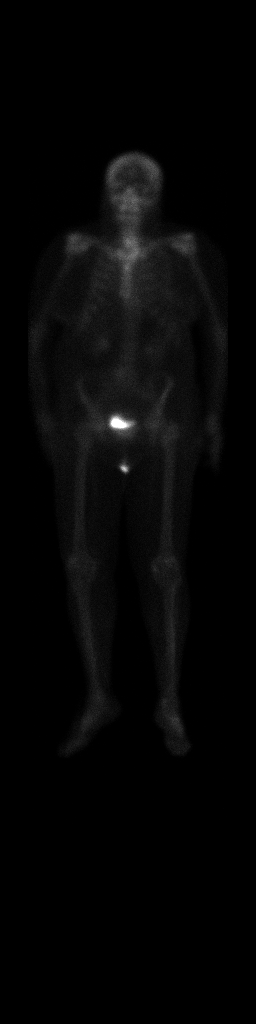

[2 of 2 positions shown; findings below may reference images not displayed]

FINDINGS: A whole-body bone scan was performed. There is homogeneous uptake of
the tracer within bony structures without abnormal foci of increased
activity to suggest metastatic disease. Normal bladder activity.
Probable urine contamination perineal region.
IMPRESSION: Homogeneous uptake of the tracer within bony structures without
evidence of metastatic disease.

## 2017-01-15 ENCOUNTER — Encounter: Payer: Self-pay | Admitting: Gastroenterology

## 2017-02-11 ENCOUNTER — Encounter: Payer: Self-pay | Admitting: Gastroenterology

## 2017-02-13 ENCOUNTER — Other Ambulatory Visit: Payer: Self-pay

## 2017-02-13 ENCOUNTER — Ambulatory Visit: Payer: Medicare Other

## 2017-02-13 VITALS — Ht 60.0 in | Wt 167.8 lb

## 2017-02-13 DIAGNOSIS — Z86010 Personal history of colonic polyps: Secondary | ICD-10-CM

## 2017-02-13 DIAGNOSIS — Q396 Congenital diverticulum of esophagus: Secondary | ICD-10-CM

## 2017-02-13 MED ORDER — NA SULFATE-K SULFATE-MG SULF 17.5-3.13-1.6 GM/177ML PO SOLN: 1.0000 | Freq: Once | ORAL | 0 refills | Status: AC

## 2017-02-13 NOTE — Progress Notes (Signed)
Patient has been scheduled for manometry and BS.  Manometry scheduled for 03/10/17 10:30 at [facility] and BS at [facility] on 02/18/17 1:30.    Left message for patient to call back  Patient is notified of all the all the appointment details.

## 2017-02-13 NOTE — Progress Notes (Signed)
Denies allergies to eggs or soy products. Denies complication of anesthesia or sedation. Denies use of weight loss medication. Denies use of O2.   Emmi instructions declined. Patient has already seen it.

## 2017-02-18 ENCOUNTER — Ambulatory Visit (HOSPITAL_COMMUNITY)
Admission: RE | Admit: 2017-02-18 | Discharge: 2017-02-18 | Disposition: A | Discharge status: Home / Self Care | Payer: Medicare Other | Source: Ambulatory Visit | Attending: Gastroenterology | Admitting: Gastroenterology

## 2017-02-18 DIAGNOSIS — Q396 Congenital diverticulum of esophagus: Secondary | ICD-10-CM

## 2017-02-18 DIAGNOSIS — R4702 Dysphasia: Secondary | ICD-10-CM | POA: Diagnosis not present

## 2017-02-26 ENCOUNTER — Encounter: Payer: Self-pay | Admitting: Gastroenterology

## 2017-02-26 ENCOUNTER — Ambulatory Visit (AMBULATORY_SURGERY_CENTER): Payer: Medicare Other | Admitting: Gastroenterology

## 2017-02-26 VITALS — BP 125/68 | HR 65 | Temp 98.9°F | Resp 14 | Ht 63.0 in | Wt 166.0 lb

## 2017-02-26 DIAGNOSIS — I10 Essential (primary) hypertension: Secondary | ICD-10-CM | POA: Diagnosis not present

## 2017-02-26 DIAGNOSIS — K635 Polyp of colon: Secondary | ICD-10-CM

## 2017-02-26 DIAGNOSIS — Z86010 Personal history of colonic polyps: Secondary | ICD-10-CM

## 2017-02-26 DIAGNOSIS — D122 Benign neoplasm of ascending colon: Secondary | ICD-10-CM

## 2017-02-26 DIAGNOSIS — D123 Benign neoplasm of transverse colon: Secondary | ICD-10-CM

## 2017-02-26 DIAGNOSIS — K219 Gastro-esophageal reflux disease without esophagitis: Secondary | ICD-10-CM | POA: Diagnosis not present

## 2017-02-26 MED ORDER — SODIUM CHLORIDE 0.9 % IV SOLN: 500.0000 mL | INTRAVENOUS | Status: DC

## 2017-02-26 NOTE — Op Note (Signed)
[place] Patient Name: Kristen Howe Procedure Date: 02/26/2017 8:01 AM MRN: [id number] Endoscopist: Ladene Artist , MD Age: 71 Referring MD:  Date of Birth: [birth year] Gender: Female Account #: [id number] Procedure:                Colonoscopy Indications:              Surveillance: Personal history of piecemeal removal                            of large sessile adenoma on last colonoscopy 6                            months ago Medicines:                Monitored Anesthesia Care Procedure:                Pre-Anesthesia Assessment:                           - Prior to the procedure, a History and Physical                            was performed, and patient medications and                            allergies were reviewed. The patient's tolerance of                            previous anesthesia was also reviewed. The risks                            and benefits of the procedure and the sedation                            options and risks were discussed with the patient.                            All questions were answered, and informed consent                            was obtained. Prior Anticoagulants: The patient has                            taken no previous anticoagulant or antiplatelet                            agents. ASA Grade Assessment: II - A patient with                            mild systemic disease. After reviewing the risks                            and benefits, the patient was deemed in  satisfactory condition to undergo the procedure.                           After obtaining informed consent, the colonoscope                            was passed under direct vision. Throughout the                            procedure, the patient's blood pressure, pulse, and                            oxygen saturations were monitored continuously. The                            Colonoscope was introduced through the anus and                             advanced to the the cecum, identified by                            appendiceal orifice and ileocecal valve. The                            ileocecal valve, appendiceal orifice, and rectum                            were photographed. The quality of the bowel                            preparation was adequate. The colonoscopy was                            performed without difficulty. The patient tolerated                            the procedure well. Scope In: 8:43:33 AM Scope Out: 8:57:29 AM Scope Withdrawal Time: 0 hours 10 minutes 57 seconds  Total Procedure Duration: 0 hours 13 minutes 56 seconds  Findings:                 The perianal and digital rectal examinations were                            normal.                           A 12 mm polyp was found in the ascending colon. The                            polyp was sessile. The polyp was removed with a hot                            snare. Resection and retrieval were complete.  A 6 mm polyp was found in the transverse colon. The                            polyp was sessile. The polyp was removed with a                            cold snare. Resection and retrieval were complete.                           Two tattoos were seen in the ascending colon. The                            tattoo sites appeared normal. Residual polyp                            described above located between the two tattoos.                           Multiple large-mouthed diverticula were found in                            the left colon. There was narrowing of the colon in                            association with the diverticular opening. There                            was evidence of diverticular spasm. There was no                            evidence of diverticular bleeding.                           Scattered medium-mouthed diverticula were found in                            the  transverse colon. There was no evidence of                            diverticular bleeding.                           The exam was otherwise without abnormality on                            direct and retroflexion views. Complications:            No immediate complications. Estimated blood loss:                            None. Estimated Blood Loss:     Estimated blood loss: none. Impression:               - One 12 mm polyp in the ascending colon, removed  with a hot snare. Resected and retrieved.                           - One 6 mm polyp in the transverse colon, removed                            with a cold snare. Resected and retrieved.                           - A tattoo was seen in the ascending colon. The                            tattoo site appeared normal.                           - Moderate diverticulosis in the left colon.                           - Mild diverticulosis in the transverse colon.                           - The examination was otherwise normal on direct                            and retroflexion views. Recommendation:           - Repeat colonoscopy in 3 years for surveillance.                           - Patient has a contact number available for                            emergencies. The signs and symptoms of potential                            delayed complications were discussed with the                            patient. Return to normal activities tomorrow.                            Written discharge instructions were provided to the                            patient.                           - Resume previous diet.                           - Continue present medications.                           - Await pathology results.                           -  No aspirin, ibuprofen, naproxen, or other                            non-steroidal anti-inflammatory drugs for 2 weeks                            after polyp  removal. Ladene Artist, MD 02/26/2017 9:04:40 AM This report has been signed electronically.

## 2017-02-26 NOTE — Progress Notes (Signed)
No problems noted in the recovery room. maw 

## 2017-02-26 NOTE — Patient Instructions (Signed)
YOU HAD AN ENDOSCOPIC PROCEDURE TODAY AT [facility]:   Refer to the procedure report that was given to you for any specific questions about what was found during the examination.  If the procedure report does not answer your questions, please call your gastroenterologist to clarify.  If you requested that your care partner not be given the details of your procedure findings, then the procedure report has been included in a sealed envelope for you to review at your convenience later.  YOU SHOULD EXPECT: Some feelings of bloating in the abdomen. Passage of more gas than usual.  Walking can help get rid of the air that was put into your GI tract during the procedure and reduce the bloating. If you had a lower endoscopy (such as a colonoscopy or flexible sigmoidoscopy) you may notice spotting of blood in your stool or on the toilet paper. If you underwent a bowel prep for your procedure, you may not have a normal bowel movement for a few days.  Please Note:  You might notice some irritation and congestion in your nose or some drainage.  This is from the oxygen used during your procedure.  There is no need for concern and it should clear up in a day or so.  SYMPTOMS TO REPORT IMMEDIATELY:   Following lower endoscopy (colonoscopy or flexible sigmoidoscopy):  Excessive amounts of blood in the stool  Significant tenderness or worsening of abdominal pains  Swelling of the abdomen that is new, acute  Fever of 100F or higher   For urgent or emergent issues, a gastroenterologist can be reached at any hour by calling [phone].   DIET:  We do recommend a small meal at first, but then you may proceed to your regular diet.  Drink plenty of fluids but you should avoid alcoholic beverages for 24 hours.  ACTIVITY:  You should plan to take it easy for the rest of today and you should NOT DRIVE or use heavy machinery until tomorrow (because of the sedation medicines used during the test).     FOLLOW UP: Our staff will call the number listed on your records the next business day following your procedure to check on you and address any questions or concerns that you may have regarding the information given to you following your procedure. If we do not reach you, we will leave a message.  However, if you are feeling well and you are not experiencing any problems, there is no need to return our call.  We will assume that you have returned to your regular daily activities without incident.  If any biopsies were taken you will be contacted by phone or by letter within the next 1-3 weeks.  Please call us at [phone] if you have not heard about the biopsies in 3 weeks.    SIGNATURES/CONFIDENTIALITY: You and/or your care partner have signed paperwork which will be entered into your electronic medical record.  These signatures attest to the fact that that the information above on your After Visit Summary has been reviewed and is understood.  Full responsibility of the confidentiality of this discharge information lies with you and/or your care-partner.   Handouts were given to your care partner on polyps, diverticulosis, and a high fiber diet with liberal fluid intake. No aspirin, aspirin products,  ibuprofen, naproxen, advil, motrin, aleve, or other non-steroidal anti-inflammatory drugs for 14 days after polyp removal. You may resume your other current medications today. Await biopsy results. Please call if  any questions or concerns.   

## 2017-02-26 NOTE — Progress Notes (Signed)
Report to PACU, RN, vss, BBS= Clear.  

## 2017-02-26 NOTE — Progress Notes (Signed)
Called to room to assist during endoscopic procedure.  Patient ID and intended procedure confirmed with present staff. Received instructions for my participation in the procedure from the performing physician.  

## 2017-02-27 ENCOUNTER — Telehealth: Payer: Self-pay | Admitting: *Deleted

## 2017-02-27 NOTE — Telephone Encounter (Signed)
  Follow up Call-  Call back number 02/26/2017 09/06/2016  Post procedure Call Back phone  # [phone]  Permission to leave phone message Yes Yes  Some recent data might be hidden     Patient questions:  Do you have a fever, pain , or abdominal swelling? No. Pain Score  0 *  Have you tolerated food without any problems? Yes.    Have you been able to return to your normal activities? Yes.    Do you have any questions about your discharge instructions: Diet   No. Medications  No. Follow up visit  No.  Do you have questions or concerns about your Care? No.  Actions: * If pain score is 4 or above: No action needed, pain <4.

## 2017-02-27 NOTE — Telephone Encounter (Signed)
No answer- will try call later today

## 2017-03-10 ENCOUNTER — Ambulatory Visit (HOSPITAL_COMMUNITY)
Admission: RE | Admit: 2017-03-10 | Discharge: 2017-03-10 | Disposition: A | Discharge status: Home / Self Care | Payer: Medicare Other | Source: Ambulatory Visit | Attending: Gastroenterology | Admitting: Gastroenterology

## 2017-03-10 ENCOUNTER — Encounter (HOSPITAL_COMMUNITY)
Admission: RE | Disposition: A | Discharge status: Home / Self Care | Payer: Self-pay | Source: Ambulatory Visit | Attending: Gastroenterology

## 2017-03-10 DIAGNOSIS — R1310 Dysphagia, unspecified: Secondary | ICD-10-CM | POA: Insufficient documentation

## 2017-03-10 DIAGNOSIS — R1319 Other dysphagia: Secondary | ICD-10-CM

## 2017-03-10 HISTORY — PX: ESOPHAGEAL MANOMETRY: SHX5429

## 2017-03-10 SURGERY — MANOMETRY, ESOPHAGUS

## 2017-03-10 MED ORDER — LIDOCAINE VISCOUS 2 % MT SOLN
OROMUCOSAL | Status: AC
  Filled 2017-03-10: qty 15

## 2017-03-10 SURGICAL SUPPLY — 2 items
FACESHIELD LNG OPTICON STERILE (SAFETY) IMPLANT
GLOVE BIO SURGEON STRL SZ8 (GLOVE) ×6 IMPLANT

## 2017-03-10 NOTE — Progress Notes (Signed)
Esophageal manometry done per protocol taking extra care in advancement of the catheter.  Pt tolerated well without complication or difficulty. Report to be sent to Dr. Lynne Leader office.

## 2017-03-10 NOTE — Progress Notes (Signed)
Talked with Dr. Fuller Plan regarding manometry intubation of probe and esophageal diverticulum. Dr. Fuller Plan is not concerned and feels there is no risk doing the manometry. Will not advance probe if feeling resistance and take extra care with insertion to be gentle.

## 2017-03-12 ENCOUNTER — Encounter (HOSPITAL_COMMUNITY): Payer: Self-pay | Admitting: Gastroenterology

## 2017-03-13 DIAGNOSIS — R1319 Other dysphagia: Secondary | ICD-10-CM

## 2017-03-13 DIAGNOSIS — R1310 Dysphagia, unspecified: Secondary | ICD-10-CM

## 2017-03-14 ENCOUNTER — Encounter: Payer: Self-pay | Admitting: Gastroenterology

## 2017-03-21 ENCOUNTER — Telehealth: Payer: Self-pay | Admitting: Family

## 2017-03-21 DIAGNOSIS — E781 Pure hyperglyceridemia: Secondary | ICD-10-CM

## 2017-03-21 DIAGNOSIS — I10 Essential (primary) hypertension: Secondary | ICD-10-CM

## 2017-03-21 MED ORDER — GEMFIBROZIL 600 MG PO TABS: 600.0000 mg | ORAL_TABLET | Freq: Two times a day (BID) | ORAL | 1 refills | Status: DC

## 2017-03-21 MED ORDER — CYCLOBENZAPRINE HCL 10 MG PO TABS: 10.0000 mg | ORAL_TABLET | Freq: Three times a day (TID) | ORAL | 0 refills | Status: DC | PRN

## 2017-03-21 MED ORDER — LOSARTAN POTASSIUM-HCTZ 50-12.5 MG PO TABS: 1.0000 | ORAL_TABLET | Freq: Every day | ORAL | 1 refills | Status: DC

## 2017-03-21 NOTE — Telephone Encounter (Signed)
Medications have been sent

## 2017-03-21 NOTE — Telephone Encounter (Signed)
Pt came by needing a refill on the following prescriptions:  losartan-hydrochlorothiazide,cyclobenzaprine and gemfibrozil sent to [facility] on [address].

## 2017-04-14 ENCOUNTER — Encounter: Payer: Self-pay | Admitting: Gastroenterology

## 2017-04-14 ENCOUNTER — Ambulatory Visit (INDEPENDENT_AMBULATORY_CARE_PROVIDER_SITE_OTHER): Payer: Medicare Other | Admitting: Gastroenterology

## 2017-04-14 VITALS — BP 114/72 | HR 80 | Ht 63.0 in | Wt 160.4 lb

## 2017-04-14 DIAGNOSIS — R1310 Dysphagia, unspecified: Secondary | ICD-10-CM

## 2017-04-14 DIAGNOSIS — Q396 Congenital diverticulum of esophagus: Secondary | ICD-10-CM | POA: Diagnosis not present

## 2017-04-14 DIAGNOSIS — R1319 Other dysphagia: Secondary | ICD-10-CM

## 2017-04-14 DIAGNOSIS — K224 Dyskinesia of esophagus: Secondary | ICD-10-CM

## 2017-04-14 NOTE — Patient Instructions (Signed)
Discontinue reglan.   You do not hear from [place] later on this week, then please call our office back.  Thank you for choosing me and [facility].  [name]. Dagoberto Ligas., MD., Marval Regal

## 2017-04-14 NOTE — Progress Notes (Signed)
    History of Present Illness: This is a 71 year old female with a large esophageal diverticulum on EGD and barium esophagram and an elevated LES residual pressure of 23.9 on manometry. She has dysphagia with most meals. Symptoms have not changed. We reviewed the results of her workup to date including EGD, barium esophagram and esophageal manometry  Current Medications, Allergies, Past Medical History, Past Surgical History, Family History and Social History were reviewed in [employer] record.  Physical Exam: General: Well developed, well nourished, no acute distress Head: Normocephalic and atraumatic Eyes:  sclerae anicteric, EOMI Ears: Normal auditory acuity Mouth: No deformity or lesions Lungs: Clear throughout to auscultation Heart: Regular rate and rhythm; no murmurs, rubs or bruits Abdomen: Soft, non tender and non distended. No masses, hepatosplenomegaly or hernias noted. Normal Bowel sounds Musculoskeletal: Symmetrical with no gross deformities  Pulses:  Normal pulses noted Extremities: No clubbing, cyanosis, edema or deformities noted Neurological: Alert oriented x 4, grossly nonfocal Psychological:  Alert and cooperative. Normal mood and affect  Assessment and Recommendations:  1. Elevated LES residual pressure with persistent dysphagia. Discussed option of a calcium channel blocker and she is agreeable to try this. I will this discuss further with her PCP to be sure there are no concerns. REV in 2 months.   I spent 15 minutes of face-to-face time with the patient. Greater than 50% of the time was spent counseling and coordinating care.

## 2017-04-15 ENCOUNTER — Telehealth: Payer: Self-pay

## 2017-04-15 MED ORDER — DILTIAZEM HCL 30 MG PO TABS: 30.0000 mg | ORAL_TABLET | Freq: Three times a day (TID) | ORAL | 3 refills | Status: DC

## 2017-04-15 NOTE — Telephone Encounter (Signed)
-----   Message from Ladene Artist, MD sent at 04/15/2017  4:22 PM EDT ----- Please let the pt know I communicated with Terri Piedra, her PCP, and we will start diltiazem 30 mg tid, taken 30-60 minutes ac.  She should watch for signs of lower BP and come in for a BP check 1-3 days after starting.    ----- Message ----- From: Golden Circle, FNP Sent: 04/15/2017   4:04 PM To: Ladene Artist, MD  Thank you the message. The only thing that we would need to watch is her blood pressure. We can make some adjustments to her regimen if we need to by decreasing her lisinopril-hctz or eliminating it.  Please let me know if I can be of further assistance. I appreciate your assistance in Ms. Reading's care.   Marya Amsler ----- Message ----- From: Ladene Artist, MD Sent: 04/15/2017   3:09 PM To: Golden Circle, FNP  Hi, I would like to start diltiazem on this patient for her esophageal motility disorder and I wanted your opinion since she is being treated for hypertension. Anything we need to change or monitor by adding diltiazem?   Thank you,  Lucio Edward

## 2017-04-15 NOTE — Telephone Encounter (Signed)
Patient notified of the recommendations. She will come in for BP check on 04/18/17 between 10-11

## 2017-05-02 ENCOUNTER — Other Ambulatory Visit: Payer: Self-pay | Admitting: *Deleted

## 2017-05-02 DIAGNOSIS — C50212 Malignant neoplasm of upper-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 breast: Secondary | ICD-10-CM

## 2017-05-02 DIAGNOSIS — Z170 Estrogen receptor positive status [ER+]: Principal | ICD-10-CM

## 2017-05-05 ENCOUNTER — Other Ambulatory Visit (HOSPITAL_BASED_OUTPATIENT_CLINIC_OR_DEPARTMENT_OTHER): Payer: Medicare Other

## 2017-05-05 ENCOUNTER — Ambulatory Visit (HOSPITAL_BASED_OUTPATIENT_CLINIC_OR_DEPARTMENT_OTHER): Payer: Medicare Other

## 2017-05-05 ENCOUNTER — Ambulatory Visit (HOSPITAL_BASED_OUTPATIENT_CLINIC_OR_DEPARTMENT_OTHER): Payer: Medicare Other | Admitting: Oncology

## 2017-05-05 VITALS — BP 122/65 | HR 67 | Temp 98.4°F | Resp 19 | Ht 63.0 in | Wt 161.5 lb

## 2017-05-05 DIAGNOSIS — Z170 Estrogen receptor positive status [ER+]: Principal | ICD-10-CM

## 2017-05-05 DIAGNOSIS — M810 Age-related osteoporosis without current pathological fracture: Secondary | ICD-10-CM | POA: Diagnosis not present

## 2017-05-05 DIAGNOSIS — C50412 Malignant neoplasm of upper-outer quadrant of left female breast: Secondary | ICD-10-CM

## 2017-05-05 DIAGNOSIS — C773 Secondary and unspecified malignant neoplasm of axilla and upper limb lymph nodes: Secondary | ICD-10-CM

## 2017-05-05 DIAGNOSIS — C50411 Malignant neoplasm of upper-outer quadrant of right female breast: Secondary | ICD-10-CM

## 2017-05-05 DIAGNOSIS — C50212 Malignant neoplasm of upper-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 breast: Secondary | ICD-10-CM

## 2017-05-05 LAB — CBC WITH DIFFERENTIAL/PLATELET
BASO%: 1.2 % (ref 0.0–2.0)
BASOS ABS: 0.1 10*3/uL (ref 0.0–0.1)
EOS ABS: 0.1 10*3/uL (ref 0.0–0.5)
EOS%: 2.7 % (ref 0.0–7.0)
HEMATOCRIT: 37.6 % (ref 34.8–46.6)
HEMOGLOBIN: 12.6 g/dL (ref 11.6–15.9)
LYMPH#: 0.9 10*3/uL (ref 0.9–3.3)
LYMPH%: 17.4 % (ref 14.0–49.7)
MCH: 28.9 pg (ref 25.1–34.0)
MCHC: 33.4 g/dL (ref 31.5–36.0)
MCV: 86.4 fL (ref 79.5–101.0)
MONO#: 0.4 10*3/uL (ref 0.1–0.9)
MONO%: 8.3 % (ref 0.0–14.0)
NEUT%: 70.4 % (ref 38.4–76.8)
NEUTROS ABS: 3.5 10*3/uL (ref 1.5–6.5)
Platelets: 293 10*3/uL (ref 145–400)
RBC: 4.35 10*6/uL (ref 3.70–5.45)
RDW: 14 % (ref 11.2–14.5)
WBC: 5 10*3/uL (ref 3.9–10.3)

## 2017-05-05 LAB — COMPREHENSIVE METABOLIC PANEL
ALT: 25 U/L (ref 0–55)
ANION GAP: 11 meq/L (ref 3–11)
AST: 19 U/L (ref 5–34)
Albumin: 3.7 g/dL (ref 3.5–5.0)
Alkaline Phosphatase: 87 U/L (ref 40–150)
BILIRUBIN TOTAL: 0.35 mg/dL (ref 0.20–1.20)
BUN: 15.5 mg/dL (ref 7.0–26.0)
CHLORIDE: 106 meq/L (ref 98–109)
CO2: 26 mEq/L (ref 22–29)
Calcium: 10.2 mg/dL (ref 8.4–10.4)
Creatinine: 1 mg/dL (ref 0.6–1.1)
EGFR: 54 mL/min/{1.73_m2} — AB (ref >90)
Glucose: 164 mg/dl — ABNORMAL HIGH (ref 70–140)
POTASSIUM: 4.2 meq/L (ref 3.5–5.1)
Sodium: 142 mEq/L (ref 136–145)
Total Protein: 7.4 g/dL (ref 6.4–8.3)

## 2017-05-05 MED ORDER — DENOSUMAB 60 MG/ML ~~LOC~~ SOLN
60.0000 mg | Freq: Once | SUBCUTANEOUS | Status: AC
  Administered 2017-05-05: 60 mg via SUBCUTANEOUS
  Filled 2017-05-05: qty 1

## 2017-05-05 MED ORDER — ANASTROZOLE 1 MG PO TABS: 1.0000 mg | ORAL_TABLET | Freq: Every day | ORAL | 4 refills | Status: DC

## 2017-05-05 NOTE — Patient Instructions (Signed)
Denosumab injection  What is this medicine?  DENOSUMAB (den oh sue mab) slows bone breakdown. Prolia is used to treat osteoporosis in women after menopause and in men. Xgeva is used to prevent bone fractures and other bone problems caused by cancer bone metastases. Xgeva is also used to treat giant cell tumor of the bone.  This medicine may be used for other purposes; ask your health care provider or pharmacist if you have questions.  What should I tell my health care provider before I take this medicine?  They need to know if you have any of these conditions:  -dental disease  -eczema  -infection or history of infections  -kidney disease or on dialysis  -low blood calcium or vitamin D  -malabsorption syndrome  -scheduled to have surgery or tooth extraction  -taking medicine that contains denosumab  -thyroid or parathyroid disease  -an unusual reaction to denosumab, other medicines, foods, dyes, or preservatives  -pregnant or trying to get pregnant  -breast-feeding  How should I use this medicine?  This medicine is for injection under the skin. It is given by a health care professional in a hospital or clinic setting.  If you are getting Prolia, a special MedGuide will be given to you by the pharmacist with each prescription and refill. Be sure to read this information carefully each time.  For Prolia, talk to your pediatrician regarding the use of this medicine in children. Special care may be needed. For Xgeva, talk to your pediatrician regarding the use of this medicine in children. While this drug may be prescribed for children as young as 13 years for selected conditions, precautions do apply.  Overdosage: If you think you have taken too much of this medicine contact a poison control center or emergency room at once.  NOTE: This medicine is only for you. Do not share this medicine with others.  What if I miss a dose?  It is important not to miss your dose. Call your doctor or health care professional if you are  unable to keep an appointment.  What may interact with this medicine?  Do not take this medicine with any of the following medications:  -other medicines containing denosumab  This medicine may also interact with the following medications:  -medicines that suppress the immune system  -medicines that treat cancer  -steroid medicines like prednisone or cortisone  This list may not describe all possible interactions. Give your health care provider a list of all the medicines, herbs, non-prescription drugs, or dietary supplements you use. Also tell them if you smoke, drink alcohol, or use illegal drugs. Some items may interact with your medicine.  What should I watch for while using this medicine?  Visit your doctor or health care professional for regular checks on your progress. Your doctor or health care professional may order blood tests and other tests to see how you are doing.  Call your doctor or health care professional if you get a cold or other infection while receiving this medicine. Do not treat yourself. This medicine may decrease your body's ability to fight infection.  You should make sure you get enough calcium and vitamin D while you are taking this medicine, unless your doctor tells you not to. Discuss the foods you eat and the vitamins you take with your health care professional.  See your dentist regularly. Brush and floss your teeth as directed. Before you have any dental work done, tell your dentist you are receiving this medicine.  Do   not become pregnant while taking this medicine or for 5 months after stopping it. Women should inform their doctor if they wish to become pregnant or think they might be pregnant. There is a potential for serious side effects to an unborn child. Talk to your health care professional or pharmacist for more information.  What side effects may I notice from receiving this medicine?  Side effects that you should report to your doctor or health care professional as soon as  possible:  -allergic reactions like skin rash, itching or hives, swelling of the face, lips, or tongue  -breathing problems  -chest pain  -fast, irregular heartbeat  -feeling faint or lightheaded, falls  -fever, chills, or any other sign of infection  -muscle spasms, tightening, or twitches  -numbness or tingling  -skin blisters or bumps, or is dry, peels, or red  -slow healing or unexplained pain in the mouth or jaw  -unusual bleeding or bruising  Side effects that usually do not require medical attention (Report these to your doctor or health care professional if they continue or are bothersome.):  -muscle pain  -stomach upset, gas  This list may not describe all possible side effects. Call your doctor for medical advice about side effects. You may report side effects to FDA at 1-800-FDA-1088.  Where should I keep my medicine?  This medicine is only given in a clinic, doctor's office, or other health care setting and will not be stored at home.  NOTE: This sheet is a summary. It may not cover all possible information. If you have questions about this medicine, talk to your doctor, pharmacist, or health care provider.      2016, [employer] Standard. (2012-06-01 12:37:47)

## 2017-05-05 NOTE — Progress Notes (Signed)
[place]  Telephone:(336) [phone] [phone]) [phone]     ID: Christinia Lambeth DOB: 09-07-1946  MR#: [id number]  OVF#:[id number]  Patient Care Team: Golden Circle, FNP as PCP - General (Family Medicine) Nakai Pollio, Virgie Dad, MD as Consulting Physician (Oncology) Kyung Rudd, MD as Consulting Physician (Radiation Oncology) Stark Klein, MD as Consulting Physician (General Surgery) PCP: Golden Circle, FNP GYN: OTHER MD:  CHIEF COMPLAINT: bilateral breast cancer  CURRENT TREATMENT: Anastrozole, denosumab/Prolia   BREAST CANCER HISTORY: From the original intake note:  "Vi" herself palpated a mass in her right breast sometime in November 2016. She brought it to Dr. Purcell Nails attention on 11/20/2015 and he set her up for bilateral diagnostic mammography with tomography and I lateral breast [facility]. The breast density was category B. In the right breast upper outer quadrant there where 2 nodules, at the 9:00 position (which by ultrasound was spiculated and measured 1.7 cm) and at the 11:00 position (which by ultrasound measured 0.7 cm. The right axilla showed a grossly abnormal lymph node measuring 1.4 cm with complete effacement of the hilum.there were several other small lymph nodes with cortical thickening in the lower right axilla as well.  Biopsy of the right breast mass on 12/12/2015 showed ([id number]) and invasive ductal carcinoma, grade 2, estrogen receptor 100% positive, progesterone receptor 80% positive, both with strong staining intensity, with an MIB-1 of 20%, and HER-2 nonamplified, with a signals ratio of 1.33 and number per cell 3.25.  In the left breast mammographically there was a multifocal mass centered at the 12:00 location with suspicious calcifications. The processes measured 6.7 cm altogether. A dilated duct extended to the nipple from this area, containing microcalcifications. Ultrasonography of the left  breast found a multifocal fragmented mass superiorly, the main component being hypoechoic and irregular and tolerable than wide, measuring 2.1 cm.superior to that, there was a second mass measuring 1.9 cm and there were other suspicious solid nodules in the adjacent parenchyma. There was also a separate area of microcalcifications containing a suspicious nodule at the 1:00 position. This measured 0.8 cm. The ultrasound confirmed a dilated duct extending from the superior process to the nipple. An alteration of the left axilla showed a grossly abnormal lymph node measuring up to 1 cm and a second indeterminant lymph node.  Biopsy of the left breast 11:30 o'clock massshowed invasive ductal carcinoma, grade 2, estrogen receptor 100% positive, progesterone receptor 70% positive, with an MIB-1 of 20%, and no HER-2 amplification, the signals ratio being 1.59 and the number per cell 3.90. A second area in the breast described as retroareolar showed ductal carcinoma, possibly in situ. This was also estrogen receptor positive at 100%, progesterone receptor positive at 60%, with an MIB-1 of 5%, and HER-2 negative, with a signals ratio of 1.73 and number per cell 3.55. Biopsy of the left breast lymph node was positiveand the prognostic panel there was very similar to all the others, namely estrogen receptor 100% positive, progesterone receptor 40% positive, with an MIB-1 of 40%, and HER-2 no amplification, with a signals ratio of 1.52 and number per cell 3.85.  The patient's subsequent history is as detailed below.  INTERVAL HISTORY: Vi returns today for follow-up of her estrogen receptor positive breast cancer. She continues on anastrozole, with good tolerance. Hot flashes are not a problem. She does complain of vaginal dryness issues. She obtains a drug at a good price.  By also received denosumab/Prolia last September. She  had no side effects from that but she is aware of  REVIEW OF SYSTEMS: Vi is doing "good".  She and her sister play a lot of video games. She has sometimes pain in the surgical sites, left greater than right. There has been no swelling or erythema. She is not interested in prostheses are reconstruction. A detailed review of systems today was otherwise stable  PAST MEDICAL HISTORY: Past Medical History:  Diagnosis Date  . Allergy   . Anemia    as a child  . Anxiety   . Arthritis    "lower back" (01/25/2016)  . Bilateral breast cancer ([place])    Archie Endo 01/25/2016  . Cataract   . Depression   . Esophageal diverticulum   . GERD (gastroesophageal reflux disease)   . Hyperlipidemia   . Hypertension   . Leg cramps    takes Flexeril  . Migraine    "maybe monthly" (01/25/2016)  . Osteoporosis   . Pneumonia ~ 2005 X 1  . Primary cancer of 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 breast ([place]) 12/14/2015  . Rheumatic fever 1950s    PAST SURGICAL HISTORY: Past Surgical History:  Procedure Laterality Date  . BREAST BIOPSY Bilateral 2016  . CATARACT EXTRACTION W/ INTRAOCULAR LENS  IMPLANT, BILATERAL Bilateral 05/2015-06/2015  . CESAREAN SECTION  1974; 1976  . COLONOSCOPY    . ESOPHAGEAL MANOMETRY N/A 03/10/2017   Procedure: ESOPHAGEAL MANOMETRY (EM);  Surgeon: Ladene Artist, MD;  Location[facility];  Service: Endoscopy;  Laterality: N/A;  . EYE MUSCLE SURGERY Bilateral 1950s   for cross eyes as a child  . INSERTION OF MESH N/A 09/12/2015   Procedure: INSERTION OF MESH;  Surgeon: Erroll Luna, MD;  Location: [facility];  Service: General;  Laterality: N/A;  . MASTECTOMY COMPLETE / SIMPLE W/ SENTINEL NODE BIOPSY Right 01/25/2016  . MASTECTOMY MODIFIED RADICAL Left 01/25/2016  . MASTECTOMY W/ SENTINEL NODE BIOPSY Bilateral 01/25/2016   Procedure: RIGHT MASTECTOMY WITH SENTINEL RIGHT LYMPH NODE BIOPSY, LEFT MODIFIED RADICAL MASTECTOMY;  Surgeon: Stark Klein, MD;  Location: [facility];  Service: General;  Laterality: Bilateral;  . TONSILLECTOMY  1950s  . UMBILICAL HERNIA REPAIR N/A 09/12/2015    Procedure: REPAIR OF INCISIONAL AND UMBILICAL HERNIA;  Surgeon: Erroll Luna, MD;  Location: [facility];  Service: General;  Laterality: N/A;    FAMILY HISTORY Family History  Problem Relation Age of Onset  . Healthy Mother   . Other Mother        +"throat polyps"; +smoker  . Lymphoma Father        d. 47  . Diabetes Father   . Heart Problems Father   . Other Father        "tumors on his kidneys"  . Breast cancer Sister        dx. metachronous bilateral breast cancers at 71 and 40; - BRCA1/2 testing  . Cancer Maternal Grandmother 21       dx. spinal cancer  . Stroke Maternal Grandfather   . Gout Paternal Grandmother   . Gout Paternal Grandfather   . Lung cancer Maternal Uncle        d. 70-72; +smoker  . Diabetes Paternal Aunt   . Diabetes Paternal Uncle   . Breast cancer Cousin        maternal 1st cousin dx. unspecified age  . Lung cancer Cousin        maternal 1st cousin dx. lung cancer; +smoker  . Lupus Cousin   . Breast cancer  Cousin        paternal 1st cousin dx. 76s  . Breast cancer Cousin        paternal 1st cousin dx. 47s-30s  . Cancer Cousin        paternal 1st cousin dx. oral cancer with metastasis, also "lost a leg to cancer"; dx. 75s; +EtOH abuse  . Breast cancer Cousin        paternal 1st cousin, once-removed dx. at unspecified age  . Colon cancer Neg Hx   . Esophageal cancer Neg Hx   . Rectal cancer Neg Hx   . Stomach cancer Neg Hx   the patient's father died at the age of 54 from complications of diabetes. He had a history of lymphoma. The patient's mother is currently living at age 77. The patient's brother died in an automobile accident. The patient has a twin sister,  -Animator. She has a history of bilateral breast cancers and did undergo genetic testing in November 2012, with no BRCA1 or 2 mutation found  GYNECOLOGIC HISTORY:  No LMP recorded. Patient is postmenopausal. Menarche age 30, first live birth age 47. The patient is GX P2. She  stopped having periods approximately 1997. She did not take hormone replacement. She took oral contraceptives remotely for approximately 3 years, with no complications.  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 She is divorced and lives at home with her sister Joycelyn Schmid, with their mother, and with 2 nephews, Rush Landmark, who is not employed, and Corene Cornea, who is disabled. The patient has 3 grandchildren. She is not a church attender    ADVANCED DIRECTIVES: Not in place. At the initial clinic visit  the patient was given the appropriate forms to complete and notarize at her discretion.  HEALTH MAINTENANCE: Social History  Substance Use Topics  . Smoking status: Former Smoker    Packs/day: 1.00    Years: 0.00    Types: Cigarettes    Quit date: 12/24/2015  . Smokeless tobacco: Never Used  . Alcohol use No     Comment: 07/31/16 pt states she dosn't drink anymore,04/16/2016 "1/2 bottle wine per month" previously - not much now     Colonoscopy: 09/06/2016  PAP: 2010  Bone density: remote  Lipid panel:  No Known Allergies  Current Outpatient Prescriptions  Medication Sig Dispense Refill  . anastrozole (ARIMIDEX) 1 MG tablet Take 1 tablet (1 mg total) by mouth daily. 90 tablet 4  . Artificial Tear Ointment (DRY EYES OP) Apply 1 drop to eye daily as needed (for dry eyes).     . Calcium-Phosphorus-Vitamin D (CITRACAL +D3 PO) Take 2 tablets by mouth 2 (two) times daily.     . cyclobenzaprine (FLEXERIL) 10 MG tablet Take 1 tablet (10 mg total) by mouth 3 (three) times daily as needed. for muscle spams 90 tablet 0  . diltiazem (CARDIZEM) 30 MG tablet Take 1 tablet (30 mg total) by mouth 3 (three) times daily. Take 30-60 minutes before a meal 90 tablet 3  . gemfibrozil (LOPID) 600 MG tablet Take 1 tablet (600 mg total) by mouth 2 (two) times daily before a meal. 180 tablet 1  . ibuprofen (ADVIL,MOTRIN) 200 MG tablet Take 400 mg by mouth 2 (two) times daily  as needed for moderate pain.    Marland Kitchen losartan-hydrochlorothiazide (HYZAAR) 50-12.5 MG tablet Take 1 tablet by mouth daily. 90 tablet 1  . metoCLOPramide (REGLAN) 5 MG tablet Take  1 tablet (5 mg total) by mouth 3 (three) times daily before meals. 90 tablet 4  . Multiple Vitamins-Minerals (MULTIVITAMIN WITH MINERALS) tablet Take 1 tablet by mouth daily. Reported on 12/20/2015    . pantoprazole (PROTONIX) 40 MG tablet Take 1 tablet (40 mg total) by mouth 2 (two) times daily before a meal. 60 tablet 11  . venlafaxine XR (EFFEXOR-XR) 37.5 MG 24 hr capsule Take 1 capsule (37.5 mg total) by mouth daily with breakfast. 30 capsule 12   Current Facility-Administered Medications  Medication Dose Route Frequency Provider Last Rate Last Dose  . 0.9 %  sodium chloride infusion  500 mL Intravenous Continuous Lucio Edward T, MD      . 0.9 %  sodium chloride infusion  500 mL Intravenous Continuous Ladene Artist, MD        OBJECTIVE: middle-aged white woman Who appears stated age  Vitals:   05/05/17 1054  BP: 122/65  Pulse: 67  Resp: 19  Temp: 98.4 F (36.9 C)     Body mass index is 28.61 kg/m.    ECOG FS:0 - Asymptomatic  Sclerae unicteric, pupils round and equal Oropharynx clear and moist No cervical or supraclavicular adenopathy Lungs no rales or rhonchi Heart regular rate and rhythm Abd soft, obese, nontender, positive bowel sounds MSK no focal spinal tenderness, no upper extremity lymphedema Neuro: nonfocal, well oriented, appropriate affect Breasts: Status post bilateral mastectomies. There is no evidence of local recurrence. Both axillae are benign.  LAB RESULTS:  CMP     Component Value Date/Time   NA 141 12/04/2016 1300   K 3.5 12/04/2016 1300   CL 112 (H) 01/26/2016 0430   CO2 26 12/04/2016 1300   GLUCOSE 102 12/04/2016 1300   BUN 14.9 12/04/2016 1300   CREATININE 0.8 12/04/2016 1300   CALCIUM 9.4 12/04/2016 1300   PROT 7.6 12/04/2016 1300   ALBUMIN 3.8 12/04/2016 1300   AST  22 12/04/2016 1300   ALT 22 12/04/2016 1300   ALKPHOS 67 12/04/2016 1300   BILITOT 0.35 12/04/2016 1300   GFRNONAA >60 01/26/2016 0430   GFRAA >60 01/26/2016 0430    INo results found for: SPEP, UPEP  Lab Results  Component Value Date   WBC 5.0 05/05/2017   NEUTROABS 3.5 05/05/2017   HGB 12.6 05/05/2017   HCT 37.6 05/05/2017   MCV 86.4 05/05/2017   PLT 293 05/05/2017      Chemistry      Component Value Date/Time   NA 141 12/04/2016 1300   K 3.5 12/04/2016 1300   CL 112 (H) 01/26/2016 0430   CO2 26 12/04/2016 1300   BUN 14.9 12/04/2016 1300   CREATININE 0.8 12/04/2016 1300      Component Value Date/Time   CALCIUM 9.4 12/04/2016 1300   ALKPHOS 67 12/04/2016 1300   AST 22 12/04/2016 1300   ALT 22 12/04/2016 1300   BILITOT 0.35 12/04/2016 1300       No results found for: LABCA2  No components found for: LABCA125  No results for input(s): INR in the last 168 hours.  Urinalysis    Component Value Date/Time   COLORURINE YELLOW 01/17/2016 0910   APPEARANCEUR CLEAR 01/17/2016 0910   LABSPEC 1.022 01/17/2016 0910   PHURINE 6.0 01/17/2016 0910   GLUCOSEU NEGATIVE 01/17/2016 0910   HGBUR NEGATIVE 01/17/2016 0910   BILIRUBINUR NEGATIVE 01/17/2016 0910   KETONESUR NEGATIVE 01/17/2016 0910   PROTEINUR NEGATIVE 01/17/2016 0910   NITRITE NEGATIVE 01/17/2016 0910   LEUKOCYTESUR TRACE (A)  01/17/2016 0910    STUDIES: Barium swallow 02/18/2017 showed a stable large mid esophageal diverticulum, followed by GI  ASSESSMENT: 71 y.o. BRCA negative [place] woman  (1) status post bilateral breast biopsies and left axillary lymph node biopsy 12/12/2015, showing  (a) on the Right upper-outer quadrant, a clinical mT1c N1, stage IIA, estrogen and progesterone receptor positive, HER-2 not amplified, with an MIB-1 of 20%  (b) on the left upper inner quadrant a clinical mT2 pN1, stage IIB invasive ductal carcinoma, grade 2 over 3, estrogen and progesterone receptor positive,  HER-2 not amplified, with MIB-1 between 5 and 40%  (c) chest CT and bone scan 01/04/2016 showed no evidence of metastatic disease. They did suggest a large esophageal diverticulum  (2) Status post right mastectomy with sentinel lymph node sampling and left modified radical mastectomy 01/25/2016  (a) right side: pT2, pN1, stage IIB invasive ductal carcinoma, grade 2, with negative margins  (b) left side: pT3 pN1, stage IIIA invasive ductal carcinoma, grade 2, with negative margins  (c) both cancers were estrogen and progesterone receptor positive, HER-2 nonamplified, with MIB-1 between 5 and 20%   (3) Mammaprint on both breast cancers came back "low risk" predicting a 5 year distant metastasis free survival in the 96% range   (4) no chemotherapy and no right-sided axillary lymph node dissection recommended at conference 02/21/2016  (5) adjuvant radiation completed 05/06/2016   (6) anastrozole started 06/18/2016  (a) bone density 07/26/2016 shows osteoporosis with a T score of -2.5.  (b)  denosumab/Prolia started 09/04/2016  (7) genetics testing 04/26/2016 through the [facility] through [employer], MD) found no deleterious mutations in ATM, BARD1, BRCA1, BRCA2, BRIP1, CDH1, CHEK2, FANCC, MLH1, MSH2, MSH6, NBN, PALB2, PMS2, PTEN, RAD51C, RAD51D, TP53, and XRCC2. This panel also includes deletion/duplication analysis (without sequencing) for one gene, EPCAM.  PLAN: Vi is now a little over a year out from definitive surgery for her breast cancer with no evidence of disease recurrence. This is favorable.  She is tolerating the anastrozole well. The plan is to continue that for a total of 5 years.  She also did well with the denosumab/Prolia. She will receive a dose today and again in 6 months. She will receive another dose a year from now and after that we will repeat her bone density to assess for response  I think she would benefit from referral  to our intimacy and pelvic health program and I have placed that for her  Otherwise she will return to see me in one year. She knows to call for any problems that may develop before that visit.  Chauncey Cruel, MD   05/05/2017 10:57 AM Medical Oncology and Hematology [facility] [address] [place], [place] [address] Tel. [phone]    Fax. [phone]

## 2017-05-18 ENCOUNTER — Other Ambulatory Visit: Payer: Self-pay | Admitting: Family

## 2017-09-14 ENCOUNTER — Other Ambulatory Visit: Payer: Self-pay | Admitting: Family

## 2017-09-14 ENCOUNTER — Other Ambulatory Visit: Payer: Self-pay | Admitting: Gastroenterology

## 2017-09-14 DIAGNOSIS — I10 Essential (primary) hypertension: Secondary | ICD-10-CM

## 2017-09-25 ENCOUNTER — Ambulatory Visit: Payer: Medicare Other | Admitting: Family

## 2017-09-27 ENCOUNTER — Other Ambulatory Visit: Payer: Self-pay | Admitting: Oncology

## 2017-09-29 ENCOUNTER — Ambulatory Visit (INDEPENDENT_AMBULATORY_CARE_PROVIDER_SITE_OTHER)
Admission: RE | Admit: 2017-09-29 | Discharge: 2017-09-29 | Disposition: A | Discharge status: Home / Self Care | Payer: Medicare Other | Source: Ambulatory Visit | Attending: Nurse Practitioner | Admitting: Nurse Practitioner

## 2017-09-29 ENCOUNTER — Ambulatory Visit
Admission: RE | Admit: 2017-09-29 | Discharge: 2017-09-29 | Disposition: A | Discharge status: Home / Self Care | Payer: Medicare Other | Source: Ambulatory Visit | Attending: Nurse Practitioner | Admitting: Nurse Practitioner

## 2017-09-29 ENCOUNTER — Ambulatory Visit (INDEPENDENT_AMBULATORY_CARE_PROVIDER_SITE_OTHER): Payer: Medicare Other | Admitting: Nurse Practitioner

## 2017-09-29 ENCOUNTER — Encounter: Payer: Self-pay | Admitting: Nurse Practitioner

## 2017-09-29 VITALS — BP 120/74 | HR 71 | Temp 98.6°F | Resp 16 | Ht 63.0 in | Wt 170.0 lb

## 2017-09-29 DIAGNOSIS — F321 Major depressive disorder, single episode, moderate: Secondary | ICD-10-CM | POA: Diagnosis not present

## 2017-09-29 DIAGNOSIS — M25562 Pain in left knee: Secondary | ICD-10-CM | POA: Diagnosis not present

## 2017-09-29 DIAGNOSIS — I10 Essential (primary) hypertension: Secondary | ICD-10-CM

## 2017-09-29 DIAGNOSIS — F419 Anxiety disorder, unspecified: Secondary | ICD-10-CM

## 2017-09-29 DIAGNOSIS — M25561 Pain in right knee: Secondary | ICD-10-CM

## 2017-09-29 DIAGNOSIS — M179 Osteoarthritis of knee, unspecified: Secondary | ICD-10-CM | POA: Diagnosis not present

## 2017-09-29 MED ORDER — VENLAFAXINE HCL ER 37.5 MG PO CP24: 37.5000 mg | ORAL_CAPSULE | Freq: Every day | ORAL | 2 refills | Status: DC

## 2017-09-29 MED ORDER — LOSARTAN POTASSIUM-HCTZ 50-12.5 MG PO TABS: 1.0000 | ORAL_TABLET | Freq: Every day | ORAL | 1 refills | Status: DC

## 2017-09-29 NOTE — Progress Notes (Signed)
Subjective:    Patient ID: Kristen Howe, female    DOB: 1946-08-27, 71 y.o.   MRN: [id number]  HPI  Kristen Howe is a 71 year old female who presents today to establish care. She is transferring to me from another provider in the same clinic. She c/o bilateral knee pain and would like her blood pressure and depression medications refilled.  Hypertension- Shed like a refill on her hyzaar. She does not routinely check her blood pressure at home. She denies any headaches, chest pain, shortness of breath.  BP Readings from Last 3 Encounters:  10/03/17 120/76  09/29/17 120/74  05/05/17 122/65   Depression, anxiety- She was placed on effexor by her oncologist. She feels okay during the day, but sad at night. She denies any thoughts of harming herself or others. She'd like to remain on the same dose of effexor. Shes not interested in couseling. OQH4-76 GAD7-9  Knee pain- Bilateral. The pain began about 1 month ago. She denies injuries. No swelling, redness. The Pain is worse at the end of the day after walking all day. She has tried tylenol which provides some relief. Denies past knee pain.  Review of Systems  See HPI  Past Medical History:  Diagnosis Date  . Allergy   . Anemia    as a child  . Anxiety   . Arthritis    "lower back" (01/25/2016)  . Bilateral breast cancer ([place])    Archie Endo 01/25/2016  . Cataract   . Depression   . Esophageal diverticulum   . GERD (gastroesophageal reflux disease)   . Hyperlipidemia   . Hypertension   . Leg cramps    takes Flexeril  . Migraine    "maybe monthly" (01/25/2016)  . Osteoporosis   . Pneumonia ~ 2005 X 1  . Primary cancer of 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 breast ([place]) 12/14/2015  . Rheumatic fever 1950s     Social History   Social History  . Marital status: Divorced    Spouse name: N/A  . Number of children: 2  . Years of education: 12   Occupational History  . retired    Social History Main Topics  . Smoking status:  Former Smoker    Packs/day: 1.00    Years: 0.00    Types: Cigarettes    Quit date: 12/24/2015  . Smokeless tobacco: Never Used  . Alcohol use No     Comment: 07/31/16 pt states she dosn't drink anymore,04/16/2016 "1/2 bottle wine per month" previously - not much now  . Drug use: No  . Sexual activity: No   Other Topics Concern  . Not on file   Social History Narrative   Lives at home with sister and nephews.   Fun: Internet; games, talk to people, video games   Denies religious beliefs effecting health care.   Denies abuse and feels safe at home.     Past Surgical History:  Procedure Laterality Date  . BREAST BIOPSY Bilateral 2016  . CATARACT EXTRACTION W/ INTRAOCULAR LENS  IMPLANT, BILATERAL Bilateral 05/2015-06/2015  . CESAREAN SECTION  1974; 1976  . COLONOSCOPY    . ESOPHAGEAL MANOMETRY N/A 03/10/2017   Procedure: ESOPHAGEAL MANOMETRY (EM);  Surgeon: Ladene Artist, MD;  [facility];  Service: Endoscopy;  Laterality: N/A;  . EYE MUSCLE SURGERY Bilateral 1950s   for cross eyes as a child  . INSERTION OF MESH N/A 09/12/2015   Procedure: INSERTION OF MESH;  Surgeon: Erroll Luna, MD;  [facility];  Service: General;  Laterality: N/A;  . MASTECTOMY COMPLETE / SIMPLE W/ SENTINEL NODE BIOPSY Right 01/25/2016  . MASTECTOMY MODIFIED RADICAL Left 01/25/2016  . MASTECTOMY W/ SENTINEL NODE BIOPSY Bilateral 01/25/2016   Procedure: RIGHT MASTECTOMY WITH SENTINEL RIGHT LYMPH NODE BIOPSY, LEFT MODIFIED RADICAL MASTECTOMY;  Surgeon: Stark Klein, MD;  Location: [facility];  Service: General;  Laterality: Bilateral;  . TONSILLECTOMY  1950s  . UMBILICAL HERNIA REPAIR N/A 09/12/2015   Procedure: REPAIR OF INCISIONAL AND UMBILICAL HERNIA;  Surgeon: Erroll Luna, MD;  Location: [facility];  Service: General;  Laterality: N/A;    Family History  Problem Relation Age of Onset  . Healthy Mother   . Other Mother        +"throat polyps"; +smoker  . Lymphoma Father        d. 17  . Diabetes Father    . Heart Problems Father   . Other Father        "tumors on his kidneys"  . Breast cancer Sister        dx. metachronous bilateral breast cancers at 74 and 85; - BRCA1/2 testing  . Cancer Maternal Grandmother 20       dx. spinal cancer  . Stroke Maternal Grandfather   . Gout Paternal Grandmother   . Gout Paternal Grandfather   . Lung cancer Maternal Uncle        d. 70-72; +smoker  . Diabetes Paternal Aunt   . Diabetes Paternal Uncle   . Breast cancer Cousin        maternal 1st cousin dx. unspecified age  . Lung cancer Cousin        maternal 1st cousin dx. lung cancer; +smoker  . Lupus Cousin   . Breast cancer Cousin        paternal 1st cousin dx. 30s  . Breast cancer Cousin        paternal 1st cousin dx. 79s-30s  . Cancer Cousin        paternal 1st cousin dx. oral cancer with metastasis, also "lost a leg to cancer"; dx. 10s; +EtOH abuse  . Breast cancer Cousin        paternal 1st cousin, once-removed dx. at unspecified age  . Colon cancer Neg Hx   . Esophageal cancer Neg Hx   . Rectal cancer Neg Hx   . Stomach cancer Neg Hx     No Known Allergies  Current Outpatient Prescriptions on File Prior to Visit  Medication Sig Dispense Refill  . anastrozole (ARIMIDEX) 1 MG tablet Take 1 tablet (1 mg total) by mouth daily. 90 tablet 4  . Artificial Tear Ointment (DRY EYES OP) Apply 1 drop to eye daily as needed (for dry eyes).     . Calcium-Phosphorus-Vitamin D (CITRACAL +D3 PO) Take 2 tablets by mouth 2 (two) times daily.     . cyclobenzaprine (FLEXERIL) 10 MG tablet TAKE 1 TABLET BY MOUTH THREE TIMES DAILY AS NEEDED FOR MUSCLE SPASM 90 tablet 0  . diltiazem (CARDIZEM) 30 MG tablet Take 1 tablet (30 mg total) by mouth 3 (three) times daily. Take 30-60 minutes before a meal 90 tablet 3  . gemfibrozil (LOPID) 600 MG tablet Take 1 tablet (600 mg total) by mouth 2 (two) times daily before a meal. 180 tablet 1  . ibuprofen (ADVIL,MOTRIN) 200 MG tablet Take 400 mg by mouth 2 (two) times  daily as needed for moderate pain.    . Multiple Vitamins-Minerals (MULTIVITAMIN WITH MINERALS) tablet Take 1  tablet by mouth daily. Reported on 12/20/2015    . pantoprazole (PROTONIX) 40 MG tablet TAKE ONE TABLET BY MOUTH TWICE DAILY BEFORE MEAL(S) 60 tablet 1   Current Facility-Administered Medications on File Prior to Visit  Medication Dose Route Frequency Provider Last Rate Last Dose  . 0.9 %  sodium chloride infusion  500 mL Intravenous Continuous Lucio Edward T, MD      . 0.9 %  sodium chloride infusion  500 mL Intravenous Continuous Ladene Artist, MD        BP 120/74 (BP Location: Left Arm, Patient Position: Sitting, Cuff Size: Large)   Pulse 71   Temp 98.6 F (37 C) (Oral)   Resp 16   Ht 5' 3"  (1.6 m)   Wt 170 lb (77.1 kg)   SpO2 98%   BMI 30.11 kg/m       Objective:   Physical Exam  Constitutional: She is oriented to person, place, and time. She appears well-developed and well-nourished.  Cardiovascular: Normal rate, regular rhythm, normal heart sounds and intact distal pulses.   Pulmonary/Chest: Effort normal and breath sounds normal. No respiratory distress.  Musculoskeletal: Normal range of motion.       Right knee: She exhibits normal range of motion, no swelling and no deformity.       Left knee: She exhibits normal range of motion, no swelling and no deformity.  Neurological: She is alert and oriented to person, place, and time. She displays normal reflexes.  Skin: Skin is warm and dry.  Psychiatric: Judgment and thought content normal. She exhibits a depressed mood.    Assessment & Plan:  Knee pain-Suspect osteoarthritis.Bilateral knee x-rays ordered. Arthritis education provided. She will try tylenol and ice for now, and schedule a follow-up visit with Dr. Raeford Razor for possible joint injections.

## 2017-09-29 NOTE — Assessment & Plan Note (Addendum)
Maintained on effexor-xr 37.5 mg once daily. Refill sent. She declines counseling or medication adjustment although she continues to feel sad every night at bedtime. She reports a good mood during the daytime. She denies suicidality. She will let me know if she continues to feel anxious and depressed.

## 2017-09-29 NOTE — Patient Instructions (Addendum)
Please go downstairs for x-rays of your knees. You may continue tylenol as needed for pain. Schedule a follow-up appointment with Dr. Raeford Razor as discussed.  I have sent a refill of your losartan-hctz and effexor. Please let me know if your mood does not improve.   I'd like to see you back for your annual wellness exam at your convenience.  Thanks for letting me take care of you today :)  Arthritis Arthritis means joint pain. It can also mean joint disease. A joint is a place where bones come together. People who have arthritis may have:  Red joints.  Swollen joints.  Stiff joints.  Warm joints.  A fever.  A feeling of being sick.  Follow these instructions at home: Pay attention to any changes in your symptoms. Take these actions to help with your pain and swelling. Medicines  Take over-the-counter and prescription medicines only as told by your doctor.  Do not take aspirin for pain if your doctor says that you may have gout. Activity  Rest your joint if your doctor tells you to.  Avoid activities that make the pain worse.  Exercise your joint regularly as told by your doctor. Try doing exercises like: ? Swimming. ? Water aerobics. ? Biking. ? Walking. Joint Care   If your joint is swollen, keep it raised (elevated) if told by your doctor.  If your joint feels stiff in the morning, try taking a warm shower.  If you have diabetes, do not apply heat without asking your doctor.  If told, apply heat to the joint: ? Put a towel between the joint and the hot pack or heating pad. ? Leave the heat on the area for 20-30 minutes.  If told, apply ice to the joint: ? Put ice in a plastic bag. ? Place a towel between your skin and the bag. ? Leave the ice on for 20 minutes, 2-3 times per day.  Keep all follow-up visits as told by your doctor. Contact a doctor if:  The pain gets worse.  You have a fever. Get help right away if:  You have very bad pain in your  joint.  You have swelling in your joint.  Your joint is red.  Many joints become painful and swollen.  You have very bad back pain.  Your leg is very weak.  You cannot control your pee (urine) or poop (stool). This information is not intended to replace advice given to you by your health care provider. Make sure you discuss any questions you have with your health care provider. Document Released: 02/26/2010 Document Revised: 05/09/2016 Document Reviewed: 02/27/2015 Elsevier Interactive Patient Education  [employer].

## 2017-09-29 NOTE — Progress Notes (Signed)
Patient ID: Kristen Howe, female   DOB: November 18, 1946, 71 y.o.   MRN: [id number]

## 2017-09-29 NOTE — Assessment & Plan Note (Addendum)
BP maintained below goal with hyzaar 50-12.5mg  once daily. Refill sent. BMET ordered. She denies headaches, chest pain, shortness of breath. She plans to follow up for a wellness visit with Sharee Pimple and annual exam with me in the next three months and we will re-check her blood pressure at that time.

## 2017-09-30 ENCOUNTER — Telehealth: Payer: Self-pay | Admitting: Nurse Practitioner

## 2017-09-30 NOTE — Telephone Encounter (Signed)
Please call the patient to schedule wellness with Sharee Pimple and also a follow up with me for her blood pressure, depression and follow-up lab work.

## 2017-09-30 NOTE — Telephone Encounter (Signed)
Pt aware and will go down to the lab for labs when she is here for appointment with Raeford Razor.

## 2017-09-30 NOTE — Telephone Encounter (Signed)
I would like for her to stop by lab for BMET when she comes to see Dr Raeford Razor this week.

## 2017-10-03 ENCOUNTER — Encounter: Payer: Self-pay | Admitting: Family Medicine

## 2017-10-03 ENCOUNTER — Ambulatory Visit (INDEPENDENT_AMBULATORY_CARE_PROVIDER_SITE_OTHER): Payer: Medicare Other | Admitting: Family Medicine

## 2017-10-03 ENCOUNTER — Other Ambulatory Visit (INDEPENDENT_AMBULATORY_CARE_PROVIDER_SITE_OTHER): Payer: Medicare Other

## 2017-10-03 VITALS — BP 120/76 | HR 95 | Temp 99.2°F | Ht 63.0 in | Wt 167.0 lb

## 2017-10-03 DIAGNOSIS — R739 Hyperglycemia, unspecified: Secondary | ICD-10-CM

## 2017-10-03 DIAGNOSIS — M222X1 Patellofemoral disorders, right knee: Secondary | ICD-10-CM | POA: Diagnosis not present

## 2017-10-03 DIAGNOSIS — I10 Essential (primary) hypertension: Secondary | ICD-10-CM | POA: Diagnosis not present

## 2017-10-03 DIAGNOSIS — M222X2 Patellofemoral disorders, left knee: Secondary | ICD-10-CM

## 2017-10-03 LAB — BASIC METABOLIC PANEL
BUN: 18 mg/dL (ref 6–23)
CALCIUM: 10.2 mg/dL (ref 8.4–10.5)
CO2: 27 mEq/L (ref 19–32)
Chloride: 100 mEq/L (ref 96–112)
Creatinine, Ser: 1 mg/dL (ref 0.40–1.20)
GFR: 58.08 mL/min — AB (ref >60.00)
Glucose, Bld: 222 mg/dL — ABNORMAL HIGH (ref 70–99)
Potassium: 3.6 mEq/L (ref 3.5–5.1)
SODIUM: 139 meq/L (ref 135–145)

## 2017-10-03 NOTE — Patient Instructions (Addendum)
Thank you for coming in,   Please try the exercises. If you don't have improvement then follow-up with me and we can try injection therapy versus physical therapy.   Please feel free to call with any questions or concerns at any time, at [phone]. --Dr. Raeford Razor

## 2017-10-03 NOTE — Assessment & Plan Note (Signed)
Symptoms are likely biomechanical she has mild arthritic changes within her imaging. Has weakness with hip abduction which contributes. - Counseled on home exercise therapy. - provided Pennsaid  - If no improvement consider injection versus physical therapy

## 2017-10-03 NOTE — Progress Notes (Addendum)
Kristen Howe - 71 y.o. female MRN [id number]  Date of birth: Nov 12, 1946  SUBJECTIVE:  Including CC & ROS.  Chief Complaint  Patient presents with  . Knee Pain    Bilateral. The pain began about 1 month ago. She denies injuries.The Pain is worse at the end of the day after walking all day.    Kristen Howe is a 71 year old female is presenting with acute anterior bilateral knee pain. The pain has been occurring for one month with no inciting event. The pain is worse with getting up from a seated position. Has not had any previous injury or surgery on the knees. Has not tried any medications today. Has not had any prior injection therapy. The pain is mild to moderate in nature. The pain is an achy in nature. There is no radicular symptoms. Pain is localized to the anterior knee. Pain is worse with getting up from a seated position and going up and down stairs. She is retired.   Independent review of the right knee x-ray from 10/15 shows minimal medial joint space narrowing and some mild patellofemoral arthritis. In a review of the left knee x-ray from 10/15 shows mild joint space narrowing of the medial compartment. Left joint space narrowing seems to be worse than right.   Review of Systems  Musculoskeletal: Positive for arthralgias. Negative for gait problem and joint swelling.  Skin: Negative for color change.  Neurological: Negative for weakness and numbness.    HISTORY: Past Medical, Surgical, Social, and Family History Reviewed & Updated per EMR.   Pertinent Historical Findings include:  Past Medical History:  Diagnosis Date  . Allergy   . Anemia    as a child  . Anxiety   . Arthritis    "lower back" (01/25/2016)  . Bilateral breast cancer ([facility])    [facility] 01/25/2016  . Cataract   . Depression   . Esophageal diverticulum   . GERD (gastroesophageal reflux disease)   . Hyperlipidemia   . Hypertension   . Leg cramps    takes Flexeril  . Migraine    "maybe monthly" (01/25/2016)  .  Osteoporosis   . Pneumonia ~ 2005 X 1  . Primary cancer of 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 breast ([facility]) 12/14/2015  . Rheumatic fever 1950s    Past Surgical History:  Procedure Laterality Date  . BREAST BIOPSY Bilateral 2016  . CATARACT EXTRACTION W/ INTRAOCULAR LENS  IMPLANT, BILATERAL Bilateral 05/2015-06/2015  . CESAREAN SECTION  1974; 1976  . COLONOSCOPY    . ESOPHAGEAL MANOMETRY N/A 03/10/2017   Procedure: ESOPHAGEAL MANOMETRY (EM);  Surgeon: Ladene Artist, MD;  Location: [facility];  Service: Endoscopy;  Laterality: N/A;  . EYE MUSCLE SURGERY Bilateral 1950s   for cross eyes as a child  . INSERTION OF MESH N/A 09/12/2015   Procedure: INSERTION OF MESH;  Surgeon: Erroll Luna, MD;  Location: [facility];  Service: General;  Laterality: N/A;  . MASTECTOMY COMPLETE / SIMPLE W/ SENTINEL NODE BIOPSY Right 01/25/2016  . MASTECTOMY MODIFIED RADICAL Left 01/25/2016  . MASTECTOMY W/ SENTINEL NODE BIOPSY Bilateral 01/25/2016   Procedure: RIGHT MASTECTOMY WITH SENTINEL RIGHT LYMPH NODE BIOPSY, LEFT MODIFIED RADICAL MASTECTOMY;  Surgeon: Stark Klein, MD;  Location: [facility];  Service: General;  Laterality: Bilateral;  . TONSILLECTOMY  1950s  . UMBILICAL HERNIA REPAIR N/A 09/12/2015   Procedure: REPAIR OF INCISIONAL AND UMBILICAL HERNIA;  Surgeon: Erroll Luna, MD;  Location: [facility];  Service: General;  Laterality: N/A;  No Known Allergies  Family History  Problem Relation Age of Onset  . Healthy Mother   . Other Mother        +"throat polyps"; +smoker  . Lymphoma Father        d. 83  . Diabetes Father   . Heart Problems Father   . Other Father        "tumors on his kidneys"  . Breast cancer Sister        dx. metachronous bilateral breast cancers at 39 and 86; - BRCA1/2 testing  . Cancer Maternal Grandmother 6       dx. spinal cancer  . Stroke Maternal Grandfather   . Gout Paternal Grandmother   . Gout Paternal Grandfather   . Lung cancer Maternal Uncle        d. 70-72; +smoker    . Diabetes Paternal Aunt   . Diabetes Paternal Uncle   . Breast cancer Cousin        maternal 1st cousin dx. unspecified age  . Lung cancer Cousin        maternal 1st cousin dx. lung cancer; +smoker  . Lupus Cousin   . Breast cancer Cousin        paternal 1st cousin dx. 77s  . Breast cancer Cousin        paternal 1st cousin dx. 80s-30s  . Cancer Cousin        paternal 1st cousin dx. oral cancer with metastasis, also "lost a leg to cancer"; dx. 3s; +EtOH abuse  . Breast cancer Cousin        paternal 1st cousin, once-removed dx. at unspecified age  . Colon cancer Neg Hx   . Esophageal cancer Neg Hx   . Rectal cancer Neg Hx   . Stomach cancer Neg Hx      Social History   Social History  . Marital status: Divorced    Spouse name: N/A  . Number of children: 2  . Years of education: 12   Occupational History  . retired    Social History Main Topics  . Smoking status: Former Smoker    Packs/day: 1.00    Years: 0.00    Types: Cigarettes    Quit date: 12/24/2015  . Smokeless tobacco: Never Used  . Alcohol use No     Comment: 07/31/16 pt states she dosn't drink anymore,04/16/2016 "1/2 bottle wine per month" previously - not much now  . Drug use: No  . Sexual activity: No   Other Topics Concern  . Not on file   Social History Narrative   Lives at home with sister and nephews.   Fun: Internet; games, talk to people, video games   Denies religious beliefs effecting health care.   Denies abuse and feels safe at home.      PHYSICAL EXAM:  VS: BP 120/76 (BP Location: Left Arm, Patient Position: Sitting, Cuff Size: Large)   Pulse 95   Temp 99.2 F (37.3 C) (Oral)   Ht _0  (1.6 m)   Wt 167 lb (75.8 kg)   SpO2 97%   BMI 29.58 kg/m  Physical Exam Gen: NAD, alert, cooperative with exam, well-appearing ENT: normal lips, normal nasal mucosa,  Eye: normal EOM, normal conjunctiva and lids CV:  no edema, +2 pedal pulses   Resp: no accessory muscle use, non-labored,   Skin: no rashes, no areas of induration  Neuro: normal tone, normal sensation to touch Psych:  normal insight, alert and oriented MSK:  Left  and right knee: Normal to inspection with no erythema or effusion or obvious bony abnormalities. Palpation normal with no warmth, patellar tenderness, or condyle tenderness. Tender to palpation over the medial joint line of the right knee. No tenderness to palpation over the medial lateral joint line of the left knee. ROM full in flexion and extension and lower leg rotation. Ligaments with solid consistent endpoints including LCL, MCL. Negative Mcmurray's Non painful patellar compression. Patellar glide without crepitus. Patellar and quadriceps tendons unremarkable. Hamstring and quadriceps strength is normal.  Weakness with hip abduction strength bilaterally. Neurovascular intact  ASSESSMENT & PLAN:   Patellofemoral pain syndrome of both knees Symptoms are likely biomechanical she has mild arthritic changes within her imaging. Has weakness with hip abduction which contributes. - Counseled on home exercise therapy. - provided Pennsaid  - If no improvement consider injection versus physical therapy

## 2017-10-07 ENCOUNTER — Other Ambulatory Visit (INDEPENDENT_AMBULATORY_CARE_PROVIDER_SITE_OTHER): Payer: Medicare Other

## 2017-10-07 ENCOUNTER — Other Ambulatory Visit: Payer: Self-pay | Admitting: Nurse Practitioner

## 2017-10-07 DIAGNOSIS — R739 Hyperglycemia, unspecified: Secondary | ICD-10-CM | POA: Diagnosis not present

## 2017-10-07 DIAGNOSIS — E119 Type 2 diabetes mellitus without complications: Secondary | ICD-10-CM

## 2017-10-07 LAB — HEMOGLOBIN A1C: HEMOGLOBIN A1C: 7.4 % — AB (ref 4.6–6.5)

## 2017-10-07 MED ORDER — METFORMIN HCL 500 MG PO TABS: 500.0000 mg | ORAL_TABLET | Freq: Two times a day (BID) | ORAL | 3 refills | Status: DC

## 2017-10-07 NOTE — Progress Notes (Unsigned)
A1c is reflective of type 2 diabetes. I would like for her to start on metformin 500mg  twice daily. I have sent this prescription to her pharmacy. This medication can cause some gastrointestinal upset, including diarrhea, for some patients. She can let me know if she experiences this problem. I'd like to see her back in three months for a diabetes follow up and we can recheck her A1c at that time.

## 2017-11-03 ENCOUNTER — Other Ambulatory Visit: Payer: Self-pay | Admitting: *Deleted

## 2017-11-03 DIAGNOSIS — C50012 Malignant neoplasm of nipple and areola, left female breast: Principal | ICD-10-CM

## 2017-11-03 DIAGNOSIS — C50011 Malignant neoplasm of nipple and areola, right female breast: Secondary | ICD-10-CM

## 2017-11-04 ENCOUNTER — Other Ambulatory Visit: Payer: Self-pay | Admitting: Oncology

## 2017-11-04 ENCOUNTER — Ambulatory Visit (HOSPITAL_BASED_OUTPATIENT_CLINIC_OR_DEPARTMENT_OTHER): Payer: Medicare Other

## 2017-11-04 ENCOUNTER — Other Ambulatory Visit (HOSPITAL_BASED_OUTPATIENT_CLINIC_OR_DEPARTMENT_OTHER): Payer: Medicare Other

## 2017-11-04 VITALS — BP 120/65 | HR 68 | Temp 98.4°F | Resp 18

## 2017-11-04 DIAGNOSIS — C773 Secondary and unspecified malignant neoplasm of axilla and upper limb lymph nodes: Secondary | ICD-10-CM

## 2017-11-04 DIAGNOSIS — C50411 Malignant neoplasm of upper-outer quadrant of right female breast: Secondary | ICD-10-CM

## 2017-11-04 DIAGNOSIS — C50412 Malignant neoplasm of upper-outer quadrant of left female breast: Secondary | ICD-10-CM | POA: Diagnosis not present

## 2017-11-04 DIAGNOSIS — C50012 Malignant neoplasm of nipple and areola, left female breast: Principal | ICD-10-CM

## 2017-11-04 DIAGNOSIS — C50011 Malignant neoplasm of nipple and areola, right female breast: Secondary | ICD-10-CM

## 2017-11-04 DIAGNOSIS — M810 Age-related osteoporosis without current pathological fracture: Secondary | ICD-10-CM | POA: Diagnosis not present

## 2017-11-04 LAB — CBC WITH DIFFERENTIAL/PLATELET
BASO%: 0.8 % (ref 0.0–2.0)
Basophils Absolute: 0.1 10*3/uL (ref 0.0–0.1)
EOS ABS: 0.3 10*3/uL (ref 0.0–0.5)
EOS%: 4.8 % (ref 0.0–7.0)
HCT: 37.9 % (ref 34.8–46.6)
HEMOGLOBIN: 12.8 g/dL (ref 11.6–15.9)
LYMPH%: 17.3 % (ref 14.0–49.7)
MCH: 28.7 pg (ref 25.1–34.0)
MCHC: 33.6 g/dL (ref 31.5–36.0)
MCV: 85.2 fL (ref 79.5–101.0)
MONO#: 0.6 10*3/uL (ref 0.1–0.9)
MONO%: 9.7 % (ref 0.0–14.0)
NEUT%: 67.4 % (ref 38.4–76.8)
NEUTROS ABS: 4 10*3/uL (ref 1.5–6.5)
Platelets: 321 10*3/uL (ref 145–400)
RBC: 4.45 10*6/uL (ref 3.70–5.45)
RDW: 13.9 % (ref 11.2–14.5)
WBC: 6 10*3/uL (ref 3.9–10.3)
lymph#: 1 10*3/uL (ref 0.9–3.3)

## 2017-11-04 LAB — COMPREHENSIVE METABOLIC PANEL
ALBUMIN: 3.9 g/dL (ref 3.5–5.0)
ALK PHOS: 81 U/L (ref 40–150)
ALT: 37 U/L (ref 0–55)
ANION GAP: 11 meq/L (ref 3–11)
AST: 29 U/L (ref 5–34)
BUN: 20.5 mg/dL (ref 7.0–26.0)
CALCIUM: 10.7 mg/dL — AB (ref 8.4–10.4)
CHLORIDE: 104 meq/L (ref 98–109)
CO2: 26 mEq/L (ref 22–29)
CREATININE: 0.9 mg/dL (ref 0.6–1.1)
EGFR: >60 mL/min/{1.73_m2} (ref >60)
Glucose: 72 mg/dl (ref 70–140)
Potassium: 3.9 mEq/L (ref 3.5–5.1)
Sodium: 141 mEq/L (ref 136–145)
Total Bilirubin: 0.34 mg/dL (ref 0.20–1.20)
Total Protein: 8 g/dL (ref 6.4–8.3)

## 2017-11-04 MED ORDER — DENOSUMAB 60 MG/ML ~~LOC~~ SOLN
60.0000 mg | Freq: Once | SUBCUTANEOUS | Status: AC
  Administered 2017-11-04: 60 mg via SUBCUTANEOUS
  Filled 2017-11-04: qty 1

## 2017-11-04 NOTE — Patient Instructions (Signed)
Denosumab injection  What is this medicine?  DENOSUMAB (den oh sue mab) slows bone breakdown. Prolia is used to treat osteoporosis in women after menopause and in men. Xgeva is used to prevent bone fractures and other bone problems caused by cancer bone metastases. Xgeva is also used to treat giant cell tumor of the bone.  This medicine may be used for other purposes; ask your health care provider or pharmacist if you have questions.  What should I tell my health care provider before I take this medicine?  They need to know if you have any of these conditions:  -dental disease  -eczema  -infection or history of infections  -kidney disease or on dialysis  -low blood calcium or vitamin D  -malabsorption syndrome  -scheduled to have surgery or tooth extraction  -taking medicine that contains denosumab  -thyroid or parathyroid disease  -an unusual reaction to denosumab, other medicines, foods, dyes, or preservatives  -pregnant or trying to get pregnant  -breast-feeding  How should I use this medicine?  This medicine is for injection under the skin. It is given by a health care professional in a hospital or clinic setting.  If you are getting Prolia, a special MedGuide will be given to you by the pharmacist with each prescription and refill. Be sure to read this information carefully each time.  For Prolia, talk to your pediatrician regarding the use of this medicine in children. Special care may be needed. For Xgeva, talk to your pediatrician regarding the use of this medicine in children. While this drug may be prescribed for children as young as 13 years for selected conditions, precautions do apply.  Overdosage: If you think you have taken too much of this medicine contact a poison control center or emergency room at once.  NOTE: This medicine is only for you. Do not share this medicine with others.  What if I miss a dose?  It is important not to miss your dose. Call your doctor or health care professional if you are  unable to keep an appointment.  What may interact with this medicine?  Do not take this medicine with any of the following medications:  -other medicines containing denosumab  This medicine may also interact with the following medications:  -medicines that suppress the immune system  -medicines that treat cancer  -steroid medicines like prednisone or cortisone  This list may not describe all possible interactions. Give your health care provider a list of all the medicines, herbs, non-prescription drugs, or dietary supplements you use. Also tell them if you smoke, drink alcohol, or use illegal drugs. Some items may interact with your medicine.  What should I watch for while using this medicine?  Visit your doctor or health care professional for regular checks on your progress. Your doctor or health care professional may order blood tests and other tests to see how you are doing.  Call your doctor or health care professional if you get a cold or other infection while receiving this medicine. Do not treat yourself. This medicine may decrease your body's ability to fight infection.  You should make sure you get enough calcium and vitamin D while you are taking this medicine, unless your doctor tells you not to. Discuss the foods you eat and the vitamins you take with your health care professional.  See your dentist regularly. Brush and floss your teeth as directed. Before you have any dental work done, tell your dentist you are receiving this medicine.  Do   not become pregnant while taking this medicine or for 5 months after stopping it. Women should inform their doctor if they wish to become pregnant or think they might be pregnant. There is a potential for serious side effects to an unborn child. Talk to your health care professional or pharmacist for more information.  What side effects may I notice from receiving this medicine?  Side effects that you should report to your doctor or health care professional as soon as  possible:  -allergic reactions like skin rash, itching or hives, swelling of the face, lips, or tongue  -breathing problems  -chest pain  -fast, irregular heartbeat  -feeling faint or lightheaded, falls  -fever, chills, or any other sign of infection  -muscle spasms, tightening, or twitches  -numbness or tingling  -skin blisters or bumps, or is dry, peels, or red  -slow healing or unexplained pain in the mouth or jaw  -unusual bleeding or bruising  Side effects that usually do not require medical attention (Report these to your doctor or health care professional if they continue or are bothersome.):  -muscle pain  -stomach upset, gas  This list may not describe all possible side effects. Call your doctor for medical advice about side effects. You may report side effects to FDA at 1-800-FDA-1088.  Where should I keep my medicine?  This medicine is only given in a clinic, doctor's office, or other health care setting and will not be stored at home.  NOTE: This sheet is a summary. It may not cover all possible information. If you have questions about this medicine, talk to your doctor, pharmacist, or health care provider.      2016, [employer] Standard. (2012-06-01 12:37:47)

## 2017-11-12 NOTE — Progress Notes (Signed)
Subjective:   Kristen Howe is a 71 y.o. female who presents for Medicare Annual (Subsequent) preventive examination.  Review of Systems:  No ROS.  Medicare Wellness Visit. Additional risk factors are reflected in the social history.  Cardiac Risk Factors include: advanced age (>40mn, >>61women);diabetes mellitus;hypertension Sleep patterns: feels rested on waking, does not get up to void, gets up 1 times nightly to void and sleeps 6-7 hours nightly.    Home Safety/Smoke Alarms: Feels safe in home. Smoke alarms in place.  Living environment; residence and Firearm Safety: 2-story house, no firearms. Lives with family, no needs for DME, good support system Seat Belt Safety/Bike Helmet: Wears seat belt.     Objective:     Vitals: BP 122/72   Pulse 71   Temp 98.4 F (36.9 C) (Oral)   Wt 159 lb 8 oz (72.3 kg)   SpO2 93%   BMI 28.25 kg/m   Body mass index is 28.25 kg/m.   Tobacco Social History   Tobacco Use  Smoking Status Former Smoker  . Packs/day: 1.00  . Years: 0.00  . Pack years: 0.00  . Types: Cigarettes  . Last attempt to quit: 12/24/2015  . Years since quitting: 1.8  Smokeless Tobacco Never Used     Counseling given: Not Answered   Past Medical History:  Diagnosis Date  . Allergy   . Anemia    as a child  . Anxiety   . Arthritis    "lower back" (01/25/2016)  . [place] ([facility]    /[facility]/08/2016  . Cataract   . Depression   . Esophageal diverticulum   . GERD (gastroesophageal reflux disease)   . Hyperlipidemia   . Hypertension   . Leg cramps    takes Flexeril  . Migraine    "maybe monthly" (01/25/2016)  . Osteoporosis   . Pneumonia ~ 2005 X 1  . 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 12/14/2015  . Rheumatic fever 1950s   Past Surgical History:  Procedure Laterality Date  . BREAST BIOPSY Bilateral 2016  . CATARACT EXTRACTION W/ INTRAOCULAR LENS  IMPLANT, BILATERAL Bilateral 05/2015-06/2015  . CESAREAN  SECTION  1974; 1976  . COLONOSCOPY    . ESOPHAGEAL MANOMETRY N/A 03/10/2017   Procedure: ESOPHAGEAL MANOMETRY (EM);  Surgeon: MLadene Artist MD;  [facility];  Service: Endoscopy;  Laterality: N/A;  . EYE MUSCLE SURGERY Bilateral 1950s   for cross eyes as a child  . INSERTION OF MESH N/A 09/12/2015   Procedure: INSERTION OF MESH;  Surgeon: TErroll Luna MD;  Location[facility]  Service: General;  Laterality: N/A;  . MASTECTOMY COMPLETE / SIMPLE W/ SENTINEL NODE BIOPSY Right 01/25/2016  . MASTECTOMY MODIFIED RADICAL Left 01/25/2016  . MASTECTOMY W/ SENTINEL NODE BIOPSY Bilateral 01/25/2016   Procedure: RIGHT MASTECTOMY WITH SENTINEL RIGHT LYMPH NODE BIOPSY, LEFT MODIFIED RADICAL MASTECTOMY;  Surgeon: FStark Klein MD;  [place] [facility]  Service: General;  Laterality: Bilateral;  . TONSILLECTOMY  1950s  . UMBILICAL HERNIA REPAIR N/A 09/12/2015   Procedure: REPAIR OF INCISIONAL AND UMBILICAL HERNIA;  Surgeon: TErroll Luna MD;  [facility];  Service: General;  Laterality: N/A;   Family History  Problem Relation Age of Onset  . Healthy Mother   . Other Mother        +"throat polyps"; +smoker  . Lymphoma Father        d. 782 . Diabetes Father   . Heart Problems  Father   . Other Father        "tumors on his kidneys"  . Breast cancer Sister        dx. metachronous bilateral breast cancers at 54 and 32; - BRCA1/2 testing  . Cancer Maternal Grandmother 30       dx. spinal cancer  . Stroke Maternal Grandfather   . Gout Paternal Grandmother   . Gout Paternal Grandfather   . Lung cancer Maternal Uncle        d. 70-72; +smoker  . Diabetes Paternal Aunt   . Diabetes Paternal Uncle   . Breast cancer Cousin        maternal 1st cousin dx. unspecified age  . Lung cancer Cousin        maternal 1st cousin dx. lung cancer; +smoker  . Lupus Cousin   . Breast cancer Cousin        paternal 1st cousin dx. 67s  . Breast cancer Cousin        paternal 1st cousin dx. 59s-30s  . Cancer  Cousin        paternal 1st cousin dx. oral cancer with metastasis, also "lost a leg to cancer"; dx. 31s; +EtOH abuse  . Breast cancer Cousin        paternal 1st cousin, once-removed dx. at unspecified age  . Colon cancer Neg Hx   . Esophageal cancer Neg Hx   . Rectal cancer Neg Hx   . Stomach cancer Neg Hx    Social History   Substance and Sexual Activity  Sexual Activity No    Outpatient Encounter Medications as of 11/13/2017  Medication Sig  . anastrozole (ARIMIDEX) 1 MG tablet Take 1 tablet (1 mg total) by mouth daily.  . Artificial Tear Ointment (DRY EYES OP) Apply 1 drop to eye daily as needed (for dry eyes).   . Calcium-Phosphorus-Vitamin D (CITRACAL +D3 PO) Take 2 tablets by mouth 2 (two) times daily.   . cyclobenzaprine (FLEXERIL) 10 MG tablet Take 1 tablet (10 mg total) by mouth 3 (three) times daily as needed. for muscle spams  . diltiazem (CARDIZEM) 30 MG tablet Take 1 tablet (30 mg total) by mouth 3 (three) times daily. Take 30-60 minutes before a meal  . gemfibrozil (LOPID) 600 MG tablet Take 1 tablet (600 mg total) by mouth 2 (two) times daily before a meal.  . ibuprofen (ADVIL,MOTRIN) 200 MG tablet Take 400 mg by mouth 2 (two) times daily as needed for moderate pain.  Marland Kitchen losartan-hydrochlorothiazide (HYZAAR) 50-12.5 MG tablet Take 1 tablet by mouth daily.  . metFORMIN (GLUCOPHAGE) 500 MG tablet Take 1 tablet (500 mg total) by mouth 2 (two) times daily with a meal.  . Multiple Vitamins-Minerals (MULTIVITAMIN WITH MINERALS) tablet Take 1 tablet by mouth daily. Reported on 12/20/2015  . pantoprazole (PROTONIX) 40 MG tablet TAKE ONE TABLET BY MOUTH TWICE DAILY BEFORE MEAL(S)  . venlafaxine XR (EFFEXOR-XR) 75 MG 24 hr capsule Take 1 capsule (75 mg total) by mouth daily with breakfast.  . [DISCONTINUED] cyclobenzaprine (FLEXERIL) 10 MG tablet TAKE 1 TABLET BY MOUTH THREE TIMES DAILY AS NEEDED FOR MUSCLE SPASM  . [DISCONTINUED] venlafaxine XR (EFFEXOR-XR) 37.5 MG 24 hr capsule Take  1 capsule (37.5 mg total) by mouth daily with breakfast.  . glucose blood test strip Use as instructed   Facility-Administered Encounter Medications as of 11/13/2017  Medication  . 0.9 %  sodium chloride infusion  . 0.9 %  sodium chloride infusion    Activities of  Daily Living In your present state of health, do you have any difficulty performing the following activities: 11/13/2017  Hearing? N  Vision? N  Difficulty concentrating or making decisions? N  Walking or climbing stairs? N  Dressing or bathing? N  Doing errands, shopping? N  Preparing Food and eating ? N  Using the Toilet? N  In the past six months, have you accidently leaked urine? N  Do you have problems with loss of bowel control? N  Managing your Medications? N  Managing your Finances? N  Housekeeping or managing your Housekeeping? N  Some recent data might be hidden    Patient Care Team: Lance Sell, NP as PCP - General (Nurse Practitioner) Magrinat, Virgie Dad, MD as Consulting Physician (Oncology) Kyung Rudd, MD as Consulting Physician (Radiation Oncology) Stark Klein, MD as Consulting Physician (General Surgery)    Assessment:    Physical assessment deferred to PCP.  Exercise Activities and Dietary recommendations Current Exercise Habits: Home exercise routine, Type of exercise: walking, Time (Minutes): 40, Frequency (Times/Week): 2, Weekly Exercise (Minutes/Week): 80, Intensity: Mild, Exercise limited by: None identified;orthopedic condition(s)  Diet (meal preparation, eat out, water intake, caffeinated beverages, dairy products, fruits and vegetables): in general, a "healthy" diet  , well balanced, eats a variety of fruits and vegetables daily, limits salt, fat/cholesterol, sugar, caffeine, drinks 6-8 glasses of water daily.  Reviewed heart healthy and diabetic diet, Diet education was attached to patient's AVS. Relevant patient education assigned to patient using Emmi.  Goals    . Patient  Stated     Be as active and as healthy as possible. Continue to eat healthy and exercise. Work towards getting my diabetes under control.       Fall Risk Fall Risk  11/13/2017 09/03/2016 05/27/2016 03/11/2016  Falls in the past year? No No No No   Depression Screen PHQ 2/9 Scores 11/13/2017 09/03/2016 05/27/2016  PHQ - 2 Score 2 0 1  PHQ- 9 Score 4 - -     Cognitive Function MMSE - Mini Mental State Exam 11/13/2017  Not completed: Refused       Ad8 score reviewed for issues:  Issues making decisions: no  Less interest in hobbies / activities: no  Repeats questions, stories (family complaining): no  Trouble using ordinary gadgets (microwave, computer, phone):no  Forgets the month or year: no  Mismanaging finances: no  Remembering appts: no  Daily problems with thinking and/or memory: yes Ad8 score is= 1  There is no immunization history on file for this patient. Screening Tests Health Maintenance  Topic Date Due  . Hepatitis C Screening  01/14/1946  . FOOT EXAM  09/03/1956  . OPHTHALMOLOGY EXAM  09/03/1956  . INFLUENZA VACCINE  03/15/2018 (Originally 07/16/2017)  . TETANUS/TDAP  09/29/2018 (Originally 09/03/1965)  . PNA vac Low Risk Adult (1 of 2 - PCV13) 09/29/2018 (Originally 09/04/2011)  . MAMMOGRAM  12/11/2017  . HEMOGLOBIN A1C  04/07/2018  . COLONOSCOPY  02/27/2020  . DEXA SCAN  Completed      Plan:  Continue doing brain stimulating activities (puzzles, reading, adult coloring books, staying active) to keep memory sharp.   Continue to eat heart healthy diet (full of fruits, vegetables, whole grains, lean protein, water--limit salt, fat, and sugar intake) and increase physical activity as tolerated.  I have personally reviewed and noted the following in the patient's chart:   . Medical and social history . Use of alcohol, tobacco or illicit drugs  . Current medications and supplements .  Functional ability and status . Nutritional status . Physical  activity . Advanced directives . List of other physicians . Vitals . Screenings to include cognitive, depression, and falls . Referrals and appointments  In addition, I have reviewed and discussed with patient certain preventive protocols, quality metrics, and best practice recommendations. A written personalized care plan for preventive services as well as general preventive health recommendations were provided to patient.     Michiel Cowboy, RN  11/13/2017

## 2017-11-13 ENCOUNTER — Encounter: Payer: Self-pay | Admitting: Nurse Practitioner

## 2017-11-13 ENCOUNTER — Ambulatory Visit (INDEPENDENT_AMBULATORY_CARE_PROVIDER_SITE_OTHER): Payer: Medicare Other | Admitting: Nurse Practitioner

## 2017-11-13 VITALS — BP 122/72 | HR 71 | Temp 98.4°F | Wt 159.5 lb

## 2017-11-13 DIAGNOSIS — E119 Type 2 diabetes mellitus without complications: Secondary | ICD-10-CM | POA: Diagnosis not present

## 2017-11-13 DIAGNOSIS — M62838 Other muscle spasm: Secondary | ICD-10-CM

## 2017-11-13 DIAGNOSIS — F419 Anxiety disorder, unspecified: Secondary | ICD-10-CM

## 2017-11-13 DIAGNOSIS — Z0000 Encounter for general adult medical examination without abnormal findings: Secondary | ICD-10-CM

## 2017-11-13 DIAGNOSIS — F321 Major depressive disorder, single episode, moderate: Secondary | ICD-10-CM | POA: Diagnosis not present

## 2017-11-13 MED ORDER — CYCLOBENZAPRINE HCL 10 MG PO TABS: 10.0000 mg | ORAL_TABLET | Freq: Three times a day (TID) | ORAL | 3 refills | Status: DC | PRN

## 2017-11-13 MED ORDER — VENLAFAXINE HCL ER 75 MG PO CP24: 75.0000 mg | ORAL_CAPSULE | Freq: Every day | ORAL | 3 refills | Status: DC

## 2017-11-13 MED ORDER — GLUCOSE BLOOD VI STRP: ORAL_STRIP | 12 refills | Status: DC

## 2017-11-13 NOTE — Patient Instructions (Addendum)
Please schedule an appointment with your eye doctor for an annual diabetic eye exam.  I have placed a referral to diabetes eduction. Our office will call you to schedule this appointment. You should hear from our office in 7-10 days.  I have sent a new refill of your effexor for 75 mg daily. Id like to see you back in about 1 month to see how you are doing. We will also recheck your A1c at that time. Your goal at home is to keep fasting blood sugars (empty stomach) below 160.  You can purchase debrox over the counter for your ear wax.    Diabetes Mellitus and Food It is important for you to manage your blood sugar (glucose) level. Your blood glucose level can be greatly affected by what you eat. Eating healthier foods in the appropriate amounts throughout the day at about the same time each day will help you control your blood glucose level. It can also help slow or prevent worsening of your diabetes mellitus. Healthy eating may even help you improve the level of your blood pressure and reach or maintain a healthy weight. General recommendations for healthful eating and cooking habits include:  Eating meals and snacks regularly. Avoid going long periods of time without eating to lose weight.  Eating a diet that consists mainly of plant-based foods, such as fruits, vegetables, nuts, legumes, and whole grains.  Using low-heat cooking methods, such as baking, instead of high-heat cooking methods, such as deep frying.  Work with your dietitian to make sure you understand how to use the Nutrition Facts information on food labels. How can food affect me? Carbohydrates Carbohydrates affect your blood glucose level more than any other type of food. Your dietitian will help you determine how many carbohydrates to eat at each meal and teach you how to count carbohydrates. Counting carbohydrates is important to keep your blood glucose at a healthy level, especially if you are using insulin or taking  certain medicines for diabetes mellitus. Alcohol Alcohol can cause sudden decreases in blood glucose (hypoglycemia), especially if you use insulin or take certain medicines for diabetes mellitus. Hypoglycemia can be a life-threatening condition. Symptoms of hypoglycemia (sleepiness, dizziness, and disorientation) are similar to symptoms of having too much alcohol. If your health care provider has given you approval to drink alcohol, do so in moderation and use the following guidelines:  Women should not have more than one drink per day, and men should not have more than two drinks per day. One drink is equal to: ? 12 oz of beer. ? 5 oz of wine. ? 1 oz of hard liquor.  Do not drink on an empty stomach.  Keep yourself hydrated. Have water, diet soda, or unsweetened iced tea.  Regular soda, juice, and other mixers might contain a lot of carbohydrates and should be counted.  What foods are not recommended? As you make food choices, it is important to remember that all foods are not the same. Some foods have fewer nutrients per serving than other foods, even though they might have the same number of calories or carbohydrates. It is difficult to get your body what it needs when you eat foods with fewer nutrients. Examples of foods that you should avoid that are high in calories and carbohydrates but low in nutrients include:  Trans fats (most processed foods list trans fats on the Nutrition Facts label).  Regular soda.  Juice.  Candy.  Sweets, such as cake, pie, doughnuts, and cookies.  Fried foods.  What foods can I eat? Eat nutrient-rich foods, which will nourish your body and keep you healthy. The food you should eat also will depend on several factors, including:  The calories you need.  The medicines you take.  Your weight.  Your blood glucose level.  Your blood pressure level.  Your cholesterol level.  You should eat a variety of foods, including:  Protein. ? Lean  cuts of meat. ? Proteins low in saturated fats, such as fish, egg whites, and beans. Avoid processed meats.  Fruits and vegetables. ? Fruits and vegetables that may help control blood glucose levels, such as apples, mangoes, and yams.  Dairy products. ? Choose fat-free or low-fat dairy products, such as milk, yogurt, and cheese.  Grains, bread, pasta, and rice. ? Choose whole grain products, such as multigrain bread, whole oats, and brown rice. These foods may help control blood pressure.  Fats. ? Foods containing healthful fats, such as nuts, avocado, olive oil, canola oil, and fish.  Does everyone with diabetes mellitus have the same meal plan? Because every person with diabetes mellitus is different, there is not one meal plan that works for everyone. It is very important that you meet with a dietitian who will help you create a meal plan that is just right for you. This information is not intended to replace advice given to you by your health care provider. Make sure you discuss any questions you have with your health care provider. Document Released: 08/29/2005 Document Revised: 05/09/2016 Document Reviewed: 10/29/2013 Elsevier Interactive Patient Education  2017 [place] doing brain stimulating activities (puzzles, reading, adult coloring books, staying active) to keep memory sharp.   Continue to eat heart healthy diet (full of fruits, vegetables, whole grains, lean protein, water--limit salt, fat, and sugar intake) and increase physical activity as tolerated.   Kristen Howe , Thank you for taking time to come for your Medicare Wellness Visit. I appreciate your ongoing commitment to your health goals. Please review the following plan we discussed and let me know if I can assist you in the future.   These are the goals we discussed: Goals    . Patient Stated     Be as active and as healthy as possible. Continue to eat healthy and exercise. Work towards getting my  diabetes under control.        This is a list of the screening recommended for you and due dates:  Health Maintenance  Topic Date Due  .  Hepatitis C: One time screening is recommended by Center for Disease Control  (CDC) for  adults born from [birth year] through 1965.   [birth year]  . Complete foot exam   09/03/1956  . Eye exam for diabetics  09/03/1956  . Flu Shot  03/15/2018*  . Tetanus Vaccine  09/29/2018*  . Pneumonia vaccines (1 of 2 - PCV13) 09/29/2018*  . Mammogram  12/11/2017  . Hemoglobin A1C  04/07/2018  . Colon Cancer Screening  02/27/2020  . DEXA scan (bone density measurement)  Completed  *Topic was postponed. The date shown is not the original due date.

## 2017-11-13 NOTE — Progress Notes (Signed)
Subjective:    Patient ID: Kristen Howe, female    DOB: Sep 02, 1946, 71 y.o.   MRN: [id number]  HPI  Kristen Howe is a 71 yo female who presents today for routine follow up of chronic conditions.  Diabetes- started on metformin twice daily on about 1 month ago. She has been tolerating the medication well without any GI upset. She denies polyuria, polyphagia, polydipsia. She has noticed her legs have not been aching since she started taking the metormin.  Lab Results  Component Value Date   HGBA1C 7.4 (H) 10/07/2017    Review of Systems  Constitutional: Negative.  Negative for activity change and appetite change.  HENT: Negative for dental problem and voice change.   Eyes: Negative for visual disturbance.  Respiratory: Positive for cough. Negative for shortness of breath.   Cardiovascular: Negative for chest pain and palpitations.  Gastrointestinal: Negative for constipation and diarrhea.  Endocrine: Negative for cold intolerance and heat intolerance.  Genitourinary: Negative for difficulty urinating and hematuria.  Musculoskeletal: Negative for arthralgias and myalgias.  Skin: Negative for rash.  Allergic/Immunologic: Negative for environmental allergies and food allergies.  Neurological: Negative for speech difficulty and headaches.  Hematological: Does not bruise/bleed easily.  Psychiatric/Behavioral:       Positive for depression or anxiety.   Depression screen [facility] 2/9 11/13/2017 09/03/2016 05/27/2016  Decreased Interest 1 0 1  Down, Depressed, Hopeless 1 0 0  PHQ - 2 Score 2 0 1  Altered sleeping 0 - -  Tired, decreased energy 1 - -  Change in appetite 0 - -  Feeling bad or failure about yourself  1 - -  Trouble concentrating 0 - -  Moving slowly or fidgety/restless 0 - -  Suicidal thoughts 0 - -  PHQ-9 Score 4 - -  Difficult doing work/chores Not difficult at all - -       Objective:   Physical Exam  BP 122/72   Pulse 71   Temp 98.4 F (36.9 C) (Oral)   Wt  159 lb 8 oz (72.3 kg)   SpO2 93%   BMI 28.25 kg/m   General Appearance:    Alert, cooperative, no distress, appears stated age  Head:    Normocephalic, without obvious abnormality, atraumatic  Eyes:    PERRL, conjunctiva/corneas clear, EOM's intact right eye, EOMs left eye abnormal due to amblyopia  Ears:    Normal TM's and external ear canals, both ears: copious cerumen in right ear with partial obstruction of TM but no impaction  Nose:   Nares normal, septum midline, mucosa normal, no drainage    or sinus tenderness  Throat:   Lips, mucosa, and tongue normal; teeth and gums normal  Neck:   Supple, symmetrical, trachea midline, no adenopathy;    thyroid:  no enlargement/tenderness/nodules  Back:     Symmetric, no curvature, ROM normal, no CVA tenderness  Lungs:     Clear to auscultation bilaterally, respirations unlabored  Chest Wall:    No tenderness or deformity    Heart:    Regular rate and rhythm, S1 and S2 normal, no murmur       Abdomen:     Soft, non-tender, bowel sounds active all four quadrants,    no masses, no organomegaly        Extremities:   Extremities normal, atraumatic, no cyanosis or edema  Pulses:   2+ and symmetric all extremities  Skin:   Skin color, texture, turgor normal, no rashes, superficial burn to dorsum  of right hand   Lymph nodes:   Cervical, supraclavicular nodes normal  Neurologic:   CNII-XII intact, normal strength, sensation and reflexes    Throughout Psychiatric: Normal mood, behavior, judgement      Diabetic Foot Exam - Simple   Simple Foot Form Visual Inspection No deformities, no ulcerations, no other skin breakdown bilaterally:  Yes Sensation Testing Intact to touch and monofilament testing bilaterally:  Yes Pulse Check Posterior Tibialis and Dorsalis pulse intact bilaterally:  Yes Comments Dry flaky skin                      Assessment & Plan:  RTC in 1 month to follow up on diabetes, a1c, effexor increase.  Muscle  spasm - cyclobenzaprine (FLEXERIL) 10 MG tablet; Take 1 tablet (10 mg total) by mouth 3 (three) times daily as needed. for muscle spams  Dispense: 30 tablet; Refill: 3 Pt request prn for occasional muscle spasms. Instructions given to take lowest dose for shortest possible time and discussed side effects including dizziness, drowsiness.

## 2017-11-15 NOTE — Assessment & Plan Note (Signed)
Will increase effexor dosage, shell retun in 1 month for follow up. - venlafaxine XR (EFFEXOR-XR) 75 MG 24 hr capsule; Take 1 capsule (75 mg total) by mouth daily with breakfast.  Dispense: 30 capsule; Refill: 3

## 2017-11-15 NOTE — Assessment & Plan Note (Addendum)
-   glucose blood test strip; Use as instructed  Dispense: 50 each; Refill: 12 - Amb Referral to Nutrition and Diabetic E - [facility] DIABETES FOOT EXAM; Future  Glucometer given with teaching today. Discussed S/s hypoglycemia Annual dilated eye exam recommended. RTC in 1 month for follow up A1c

## 2017-11-15 NOTE — Progress Notes (Signed)
Medical screening examination/treatment/procedure(s) were performed by the Wellness Coach, RN. As primary care provider I was immediately available for consulation/collaboration. I agree with above documentation. Persais Ethridge, NP  

## 2017-12-03 ENCOUNTER — Encounter: Payer: Medicare Other | Attending: Nurse Practitioner | Admitting: *Deleted

## 2017-12-03 DIAGNOSIS — Z713 Dietary counseling and surveillance: Secondary | ICD-10-CM | POA: Insufficient documentation

## 2017-12-03 DIAGNOSIS — E119 Type 2 diabetes mellitus without complications: Secondary | ICD-10-CM

## 2017-12-04 NOTE — Patient Instructions (Signed)
Plan:  Aim for 2-3 Carb Choices per meal (30-45 grams)   Aim for 0-1 Carbs per snack if hungry  Include protein in moderation with your meals and snacks Consider reading food labels for Total Carbohydrate  of foods Consider  increasing your activity level daily as tolerated Consider checking BG at alternate times per day a couple of times a week to keep track of how your BG are being managed Consider taking medication as directed by MD

## 2017-12-04 NOTE — Progress Notes (Signed)
Diabetes Self-Management Education  Visit Type: First/Initial  Appt. Start Time: 1100 Appt. End Time: 1200  12/04/2017  Ms. Kristen Howe, identified by name and date of birth, is a 71 y.o. female with a diagnosis of Diabetes: Type 2. She is here with her twin sister, Shawn Route, who lives with her and who participated in the visit. Patient just got her meter a couple of weeks ago and has tested a couple of times with results of 193 and 138 mg/dl. She is primarily interested in learning about what foods she can eat with this new diagnosis of diabetes. Weight loss of almost 15 pounds in past 6 weeks noted. She states she has cut out regular sodas and sweet tea, while also cutting out fried foods.   ASSESSMENT  Height 5' (1.524 m), weight 156 lb 1.6 oz (70.8 kg). Body mass index is 30.49 kg/m.  Diabetes Self-Management Education - 12/03/17 1108      Visit Information   Visit Type  First/Initial      Initial Visit   Diabetes Type  Type 2    Are you currently following a meal plan?  No    Are you taking your medications as prescribed?  Yes    Date Diagnosed  10/2017      Health Coping   How would you rate your overall health?  Good      Psychosocial Assessment   Patient Belief/Attitude about Diabetes  Motivated to manage diabetes    Self-care barriers  None    Self-management support  Family    Other persons present  Patient;Family Member twin sister is here too    Patient Concerns  Nutrition/Meal planning;Weight Control    Special Needs  None    Learning Readiness  Change in progress    How often do you need to have someone help you when you read instructions, pamphlets, or other written materials from your doctor or pharmacy?  1 - Never    What is the last grade level you completed in school?  12      Pre-Education Assessment   Patient understands the diabetes disease and treatment process.  Needs Instruction    Patient understands incorporating nutritional management into  lifestyle.  Needs Instruction    Patient undertands incorporating physical activity into lifestyle.  Needs Instruction    Patient understands using medications safely.  Needs Instruction    Patient understands monitoring blood glucose, interpreting and using results  Needs Instruction    Patient understands prevention, detection, and treatment of acute complications.  Needs Instruction    Patient understands prevention, detection, and treatment of chronic complications.  Needs Instruction    Patient understands how to develop strategies to address psychosocial issues.  Needs Instruction    Patient understands how to develop strategies to promote health/change behavior.  Needs Instruction      Complications   Last HgB A1C per patient/outside source  7.4 %    How often do you check your blood sugar?  3-4 times / week    Fasting Blood glucose range (mg/dL)  180-200    Postprandial Blood glucose range (mg/dL)  130-179    Number of hypoglycemic episodes per month  0    Have you had a dilated eye exam in the past 12 months?  No    Have you had a dental exam in the past 12 months?  No    Are you checking your feet?  Yes    How many days per week  are you checking your feet?  7      Dietary Intake   Breakfast  multigrain English muffin or pumpernickel toast x 2 with butter    Lunch  fresh fruit OR oatmeal OR previously sandwich, burger or chicken wings OR sub    Snack (afternoon)  used to eat cake, chips or doughnuts before diagnosis    Dinner  lean meat, whole grain rice, vegetable OR spaghetti OR stew OR steak with french fries    Beverage(s)  coffee with Splenda and half and half, water      Exercise   Exercise Type  Light (walking / raking leaves) walking with shopping and takes stairs at home    How many days per week to you exercise?  3    How many minutes per day do you exercise?  30    Total minutes per week of exercise  90      Patient Education   Previous Diabetes Education  No     Disease state   Definition of diabetes, type 1 and 2, and the diagnosis of diabetes    Nutrition management   Role of diet in the treatment of diabetes and the relationship between the three main macronutrients and blood glucose level;Food label reading, portion sizes and measuring food.;Carbohydrate counting;Reviewed blood glucose goals for pre and post meals and how to evaluate the patients' food intake on their blood glucose level.    Physical activity and exercise   Role of exercise on diabetes management, blood pressure control and cardiac health.    Monitoring  Purpose and frequency of SMBG.;Identified appropriate SMBG and/or A1C goals.    Chronic complications  Relationship between chronic complications and blood glucose control      Individualized Goals (developed by patient)   Nutrition  Follow meal plan discussed    Physical Activity  Exercise 3-5 times per week    Medications  take my medication as prescribed    Monitoring   test blood glucose pre and post meals as discussed 1-2 times a week as long as results are within target ranges      Post-Education Assessment   Patient understands the diabetes disease and treatment process.  Demonstrates understanding / competency    Patient understands incorporating nutritional management into lifestyle.  Demonstrates understanding / competency    Patient understands monitoring blood glucose, interpreting and using results  Demonstrates understanding / competency      Outcomes   Expected Outcomes  Demonstrated interest in learning. Expect positive outcomes    Future DMSE  PRN    Program Status  Completed      Individualized Plan for Diabetes Self-Management Training:   Learning Objective:  Patient will have a greater understanding of diabetes self-management. Patient education plan is to attend individual and/or group sessions per assessed needs and concerns.   Plan:  Patient Instructions  Plan:  Aim for 2-3 Carb Choices per meal  (30-45 grams)   Aim for 0-1 Carbs per snack if hungry  Include protein in moderation with your meals and snacks Consider reading food labels for Total Carbohydrate  of foods Consider  increasing your activity level daily as tolerated Consider checking BG at alternate times per day a couple of times a week to keep track of how your BG are being managed Consider taking medication as directed by MD  Expected Outcomes:  Demonstrated interest in learning. Expect positive outcomes  Education material provided: Living Well with Diabetes, Meal plan card and Carbohydrate  counting sheet  If problems or questions, patient to contact team via:  Phone  Future DSME appointment: PRN

## 2017-12-11 ENCOUNTER — Other Ambulatory Visit: Payer: Self-pay | Admitting: Gastroenterology

## 2017-12-18 ENCOUNTER — Encounter: Payer: Self-pay | Admitting: Nurse Practitioner

## 2017-12-18 ENCOUNTER — Ambulatory Visit (INDEPENDENT_AMBULATORY_CARE_PROVIDER_SITE_OTHER): Payer: Medicare Other | Admitting: Nurse Practitioner

## 2017-12-18 ENCOUNTER — Other Ambulatory Visit: Payer: Self-pay | Admitting: Gastroenterology

## 2017-12-18 DIAGNOSIS — F321 Major depressive disorder, single episode, moderate: Secondary | ICD-10-CM | POA: Diagnosis not present

## 2017-12-18 DIAGNOSIS — E119 Type 2 diabetes mellitus without complications: Secondary | ICD-10-CM | POA: Diagnosis not present

## 2017-12-18 DIAGNOSIS — F419 Anxiety disorder, unspecified: Secondary | ICD-10-CM | POA: Diagnosis not present

## 2017-12-18 LAB — POCT GLYCOSYLATED HEMOGLOBIN (HGB A1C): Hemoglobin A1C: 6.2

## 2017-12-18 MED ORDER — GLUCOSE BLOOD VI STRP: ORAL_STRIP | 12 refills | Status: DC

## 2017-12-18 NOTE — Assessment & Plan Note (Signed)
Symptoms are well controlled with effexor 75 daily, continue

## 2017-12-18 NOTE — Assessment & Plan Note (Signed)
a1c stable on current dosage of metformin. Continue. Discussed blood sugar parameters at home. Medications ordered: - glucose blood test strip; Use as instructed  Dispense: 100 each; Refill: 12 Diagnostic testing ordered: - POCT HgB A1C RTC in 6 months for diabetes follow up

## 2017-12-18 NOTE — Progress Notes (Signed)
Subjective:    Patient ID: Kristen Howe, female    DOB: 1946-10-25, 72 y.o.   MRN: [id number]  HPI Ms Samad presents today for a follow up of diabetes and depression  Anxiety and depression- effexor dosage was increased to 75 once daily on 11/29 visit for uncontrolled anxiety and depression. She says she has been tolerating the increased dosage well and feels like her anxiety and depression and well controlled now She denies drowsiness, weakness,  Insomnia.  GAD 7 : Generalized Anxiety Score 12/18/2017  Nervous, Anxious, on Edge 0  Control/stop worrying 0  Worry too much - different things 0  Trouble relaxing 0  Restless 0  Easily annoyed or irritable 0  Afraid - awful might happen 0  Total GAD 7 Score 0    Depression screen [facility] 2/9 12/18/2017 12/03/2017 11/13/2017  Decreased Interest 0 1 1  Down, Depressed, Hopeless _0 PHQ - 2 Score _1 Altered sleeping 0 - 0  Tired, decreased energy 0 - 1  Change in appetite 0 - 0  Feeling bad or failure about yourself  0 - 1  Trouble concentrating 0 - 0  Moving slowly or fidgety/restless 0 - 0  Suicidal thoughts 0 - 0  PHQ-9 Score 1 - 4  Difficult doing work/chores - - Not difficult at all    Diabetes- diagnosed with elevated A1c on 10/23 and started on metformin 500 twice daily, Reports daily medication compliance without adverse medication effects. Glucometer was given at last visit and she has been checking home blood sugar readings  Every few days with reported readings ranging from 120s-140s Denies tremor, diaphoresis, polyuria, polydipsia, polyphagia. She saw diabetes nutrition educator since our last visit and felt she learned a lot about how to eat a healthy diabetic and what foods to avoid, shes been trying to stick with this diet.  Lab Results  Component Value Date   HGBA1C 6.2 12/18/2017    Review of Systems  See HPI  Past Medical History:  Diagnosis Date  . Allergy   . Anemia    as a child  . Anxiety     . Arthritis    "lower back" (01/25/2016)  . Bilateral breast cancer ([place])    Archie Endo 01/25/2016  . Cataract   . Depression   . Esophageal diverticulum   . GERD (gastroesophageal reflux disease)   . Hyperlipidemia   . Hypertension   . Leg cramps    takes Flexeril  . Migraine    "maybe monthly" (01/25/2016)  . Osteoporosis   . Pneumonia ~ 2005 X 1  . Primary cancer of 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 breast ([place]) 12/14/2015  . Rheumatic fever 1950s     Social History   Socioeconomic History  . Marital status: Divorced    Spouse name: Not on file  . Number of children: 2  . Years of education: 4  . Highest education level: Not on file  Social Needs  . Financial resource strain: Not hard at all  . Food insecurity - worry: Never true  . Food insecurity - inability: Never true  . Transportation needs - medical: No  . Transportation needs - non-medical: No  Occupational History  . Occupation: retired  Tobacco Use  . Smoking status: Former Smoker    Packs/day: 1.00    Years: 0.00    Pack years: 0.00    Types: Cigarettes    Last attempt to quit: 12/24/2015    Years  since quitting: 1.9  . Smokeless tobacco: Never Used  Substance and Sexual Activity  . Alcohol use: No    Alcohol/week: 0.0 oz    Comment: 07/31/16 pt states she dosn't drink anymore,04/16/2016 "1/2 bottle wine per month" previously - not much now  . Drug use: No  . Sexual activity: No  Other Topics Concern  . Not on file  Social History Narrative   Lives at home with sister and nephews.   Fun: Internet; games, talk to people, video games   Denies religious beliefs effecting health care.   Denies abuse and feels safe at home.     Past Surgical History:  Procedure Laterality Date  . BREAST BIOPSY Bilateral 2016  . CATARACT EXTRACTION W/ INTRAOCULAR LENS  IMPLANT, BILATERAL Bilateral 05/2015-06/2015  . CESAREAN SECTION  1974; 1976  . COLONOSCOPY    . ESOPHAGEAL MANOMETRY N/A 03/10/2017   Procedure: ESOPHAGEAL  MANOMETRY (EM);  Surgeon: Ladene Artist, MD;  Location: [facility];  Service: Endoscopy;  Laterality: N/A;  . EYE MUSCLE SURGERY Bilateral 1950s   for cross eyes as a child  . INSERTION OF MESH N/A 09/12/2015   Procedure: INSERTION OF MESH;  Surgeon: Erroll Luna, MD;  Location: [facility];  Service: General;  Laterality: N/A;  . MASTECTOMY COMPLETE / SIMPLE W/ SENTINEL NODE BIOPSY Right 01/25/2016  . MASTECTOMY MODIFIED RADICAL Left 01/25/2016  . MASTECTOMY W/ SENTINEL NODE BIOPSY Bilateral 01/25/2016   Procedure: RIGHT MASTECTOMY WITH SENTINEL RIGHT LYMPH NODE BIOPSY, LEFT MODIFIED RADICAL MASTECTOMY;  Surgeon: Stark Klein, MD;  Location: [facility];  Service: General;  Laterality: Bilateral;  . TONSILLECTOMY  1950s  . UMBILICAL HERNIA REPAIR N/A 09/12/2015   Procedure: REPAIR OF INCISIONAL AND UMBILICAL HERNIA;  Surgeon: Erroll Luna, MD;  Location: [facility];  Service: General;  Laterality: N/A;    Family History  Problem Relation Age of Onset  . Healthy Mother   . Other Mother        +"throat polyps"; +smoker  . Lymphoma Father        d. 34  . Diabetes Father   . Heart Problems Father   . Other Father        "tumors on his kidneys"  . Breast cancer Sister        dx. metachronous bilateral breast cancers at 55 and 29; - BRCA1/2 testing  . Cancer Maternal Grandmother 44       dx. spinal cancer  . Stroke Maternal Grandfather   . Gout Paternal Grandmother   . Gout Paternal Grandfather   . Lung cancer Maternal Uncle        d. 70-72; +smoker  . Diabetes Paternal Aunt   . Diabetes Paternal Uncle   . Breast cancer Cousin        maternal 1st cousin dx. unspecified age  . Lung cancer Cousin        maternal 1st cousin dx. lung cancer; +smoker  . Lupus Cousin   . Breast cancer Cousin        paternal 1st cousin dx. 38s  . Breast cancer Cousin        paternal 1st cousin dx. 29s-30s  . Cancer Cousin        paternal 1st cousin dx. oral cancer with metastasis, also "lost a leg to cancer"; dx.  46s; +EtOH abuse  . Breast cancer Cousin        paternal 1st cousin, once-removed dx. at unspecified age  . Colon cancer Neg Hx   .  Esophageal cancer Neg Hx   . Rectal cancer Neg Hx   . Stomach cancer Neg Hx     No Known Allergies  Current Outpatient Medications on File Prior to Visit  Medication Sig Dispense Refill  . anastrozole (ARIMIDEX) 1 MG tablet Take 1 tablet (1 mg total) by mouth daily. 90 tablet 4  . Artificial Tear Ointment (DRY EYES OP) Apply 1 drop to eye daily as needed (for dry eyes).     . Calcium-Phosphorus-Vitamin D (CITRACAL +D3 PO) Take 2 tablets by mouth 2 (two) times daily.     . cyclobenzaprine (FLEXERIL) 10 MG tablet Take 1 tablet (10 mg total) by mouth 3 (three) times daily as needed. for muscle spams 30 tablet 3  . diltiazem (CARDIZEM) 30 MG tablet TAKE 1 TABLET BY MOUTH THREE TIMES DAILY. TAKE 30-60 MINUTES BEFORE A MEAL 90 tablet 0  . gemfibrozil (LOPID) 600 MG tablet Take 1 tablet (600 mg total) by mouth 2 (two) times daily before a meal. 180 tablet 1  . ibuprofen (ADVIL,MOTRIN) 200 MG tablet Take 400 mg by mouth 2 (two) times daily as needed for moderate pain.    Marland Kitchen losartan-hydrochlorothiazide (HYZAAR) 50-12.5 MG tablet Take 1 tablet by mouth daily. 90 tablet 1  . metFORMIN (GLUCOPHAGE) 500 MG tablet Take 1 tablet (500 mg total) by mouth 2 (two) times daily with a meal. 180 tablet 3  . Multiple Vitamins-Minerals (MULTIVITAMIN WITH MINERALS) tablet Take 1 tablet by mouth daily. Reported on 12/20/2015    . pantoprazole (PROTONIX) 40 MG tablet TAKE ONE TABLET BY MOUTH TWICE DAILY BEFORE MEAL(S) 60 tablet 1  . venlafaxine XR (EFFEXOR-XR) 75 MG 24 hr capsule Take 1 capsule (75 mg total) by mouth daily with breakfast. 30 capsule 3   Current Facility-Administered Medications on File Prior to Visit  Medication Dose Route Frequency Provider Last Rate Last Dose  . 0.9 %  sodium chloride infusion  500 mL Intravenous Continuous Lucio Edward T, MD      . 0.9 %  sodium  chloride infusion  500 mL Intravenous Continuous Ladene Artist, MD        BP 122/78 (BP Location: Left Arm, Patient Position: Sitting, Cuff Size: Large)   Pulse 67   Temp 98.4 F (36.9 C) (Oral)   Resp 16   Ht 5' (1.524 m)   Wt 156 lb (70.8 kg)   SpO2 95%   BMI 30.47 kg/m       Objective:   Physical Exam  Constitutional: She is oriented to person, place, and time. She appears well-developed and well-nourished. No distress.  HENT:  Head: Normocephalic and atraumatic.  Cardiovascular: Normal rate, regular rhythm, normal heart sounds and intact distal pulses.  Pulmonary/Chest: Effort normal. No respiratory distress.  Neurological: She is alert and oriented to person, place, and time. Coordination normal.  Skin: Skin is warm and dry.  Psychiatric: She has a normal mood and affect. Judgment and thought content normal.      Assessment & Plan:  RTC in 6 months for routine follow up of chronic conditions

## 2017-12-18 NOTE — Patient Instructions (Addendum)
Keep up the great work on your diet and good blood sugar readings. Remember our goal is to keep your A1c below 7%, which means daily blood sugar readings below 150.   I'd like to see you back in about 6 months, or sooner if you need me.  It was nice to see you. Thanks for letting me take care of you today :)

## 2017-12-27 ENCOUNTER — Other Ambulatory Visit: Payer: Self-pay | Admitting: Family

## 2017-12-27 DIAGNOSIS — E781 Pure hyperglyceridemia: Secondary | ICD-10-CM

## 2017-12-30 ENCOUNTER — Other Ambulatory Visit: Payer: Self-pay | Admitting: Family

## 2017-12-30 DIAGNOSIS — E781 Pure hyperglyceridemia: Secondary | ICD-10-CM

## 2018-01-05 ENCOUNTER — Other Ambulatory Visit: Payer: Self-pay

## 2018-01-05 DIAGNOSIS — E781 Pure hyperglyceridemia: Secondary | ICD-10-CM

## 2018-01-05 MED ORDER — GEMFIBROZIL 600 MG PO TABS: 600.0000 mg | ORAL_TABLET | Freq: Two times a day (BID) | ORAL | 0 refills | Status: DC

## 2018-01-08 ENCOUNTER — Ambulatory Visit: Payer: Medicare Other | Admitting: Nurse Practitioner

## 2018-01-13 ENCOUNTER — Ambulatory Visit: Payer: Medicare Other | Admitting: Nurse Practitioner

## 2018-03-04 ENCOUNTER — Other Ambulatory Visit: Payer: Self-pay

## 2018-03-04 ENCOUNTER — Other Ambulatory Visit: Payer: Self-pay | Admitting: Gastroenterology

## 2018-03-04 MED ORDER — PANTOPRAZOLE SODIUM 40 MG PO TBEC: DELAYED_RELEASE_TABLET | ORAL | 0 refills | Status: DC

## 2018-03-16 ENCOUNTER — Other Ambulatory Visit: Payer: Self-pay | Admitting: Nurse Practitioner

## 2018-03-16 DIAGNOSIS — F419 Anxiety disorder, unspecified: Secondary | ICD-10-CM

## 2018-03-16 DIAGNOSIS — F321 Major depressive disorder, single episode, moderate: Secondary | ICD-10-CM

## 2018-03-23 ENCOUNTER — Ambulatory Visit: Payer: Self-pay | Admitting: *Deleted

## 2018-03-23 ENCOUNTER — Encounter: Payer: Self-pay | Admitting: Family

## 2018-03-23 ENCOUNTER — Ambulatory Visit (INDEPENDENT_AMBULATORY_CARE_PROVIDER_SITE_OTHER): Payer: Medicare Other | Admitting: Family

## 2018-03-23 DIAGNOSIS — R1310 Dysphagia, unspecified: Secondary | ICD-10-CM

## 2018-03-23 DIAGNOSIS — I10 Essential (primary) hypertension: Secondary | ICD-10-CM

## 2018-03-23 DIAGNOSIS — F419 Anxiety disorder, unspecified: Secondary | ICD-10-CM

## 2018-03-23 DIAGNOSIS — F321 Major depressive disorder, single episode, moderate: Secondary | ICD-10-CM

## 2018-03-23 DIAGNOSIS — E119 Type 2 diabetes mellitus without complications: Secondary | ICD-10-CM | POA: Diagnosis not present

## 2018-03-23 DIAGNOSIS — R1319 Other dysphagia: Secondary | ICD-10-CM

## 2018-03-23 MED ORDER — VENLAFAXINE HCL ER 75 MG PO CP24: 75.0000 mg | ORAL_CAPSULE | Freq: Every day | ORAL | 1 refills | Status: DC

## 2018-03-23 MED ORDER — LOSARTAN POTASSIUM-HCTZ 50-12.5 MG PO TABS: 1.0000 | ORAL_TABLET | Freq: Every day | ORAL | 1 refills | Status: DC

## 2018-03-23 MED ORDER — METFORMIN HCL 500 MG PO TABS: 500.0000 mg | ORAL_TABLET | Freq: Two times a day (BID) | ORAL | 1 refills | Status: DC

## 2018-03-23 MED ORDER — PANTOPRAZOLE SODIUM 40 MG PO TBEC: DELAYED_RELEASE_TABLET | ORAL | 2 refills | Status: DC

## 2018-03-23 NOTE — Telephone Encounter (Signed)
FYI

## 2018-03-23 NOTE — Telephone Encounter (Signed)
Pt called because she has been having dizziness, sweating, nausea and vomiting. This has been going on for about 4 days now. She states she thinks she is from taking venlafaxine. She stated she had been off this medication for about 5 days now because the pharmacy did not have the order.  Her last cbg was last night and it  was 138. Per protocol, Primary Care at [facility], notified and spoke with [place].  Per Noralee Space, office visit made to have the patient assessed. The patient's provider is not in today.  Pt advised to call back with any worsening symptoms. She voiced understanding.  Reason for Disposition . Taking prescription medication that could cause nausea (e.g., narcotics/opiates, antibiotics, OCPs, many others)  Answer Assessment - Initial Assessment Questions 1. NAUSEA SEVERITY: "How bad is the nausea?" (e.g., mild, moderate, severe; dehydration, weight loss)   - MILD: loss of appetite without change in eating habits   - MODERATE: decreased oral intake without significant weight loss, dehydration, or malnutrition   - SEVERE: inadequate caloric or fluid intake, significant weight loss, symptoms of dehydration     moderate 2. ONSET: "When did the nausea begin?"     Started a couple of days ago 3. VOMITING: "Any vomiting?" If so, ask: "How many times today?"     Yes, just comes on . Vomited 4 times yesterday 4. RECURRENT SYMPTOM: "Have you had nausea before?" If so, ask: "When was the last time?" "What happened that time?"     no 5. CAUSE: "What do you think is causing the nausea?"     The venlafaxin 6. PREGNANCY: "Is there any chance you are pregnant?" (e.g., unprotected intercourse, missed birth control pill, broken condom)     no  Protocols used: NAUSEA-A-AH

## 2018-03-23 NOTE — Progress Notes (Signed)
Kristen Howe is a 72 y.o. female with the following history as recorded in EpicCare:  Patient Active Problem List   Diagnosis Date Noted  . Diabetes mellitus ([place]) 10/07/2017  . Patellofemoral pain syndrome of both knees 10/03/2017  . Anxiety 09/29/2017  . Current moderate episode of major depressive disorder ([place]) 09/29/2017  . Esophageal diverticulum 04/14/2017  . Esophageal motility disorder 04/14/2017  . Esophageal dysphagia   . Hypertriglyceridemia 09/03/2016  . Medicare annual wellness visit, subsequent 09/03/2016  . Overweight (BMI 25.0-29.9) 09/03/2016  . Osteoporosis 08/28/2016  . Bilateral breast cancer ([place]) 01/25/2016  . Tobacco abuse 12/22/2015  . Malignant neoplasm of upper-inner quadrant of left breast in female, estrogen receptor positive ([place]) 12/14/2015  . Lump in female breast 11/20/2015  . Umbilical hernia without obstruction and without gangrene 07/28/2015  . Essential hypertension 07/28/2015  . Seborrheic keratosis 07/28/2015    Current Outpatient Medications  Medication Sig Dispense Refill  . anastrozole (ARIMIDEX) 1 MG tablet Take 1 tablet (1 mg total) by mouth daily. 90 tablet 4  . Artificial Tear Ointment (DRY EYES OP) Apply 1 drop to eye daily as needed (for dry eyes).     . Calcium-Phosphorus-Vitamin D (CITRACAL +D3 PO) Take 2 tablets by mouth 2 (two) times daily.     . cyclobenzaprine (FLEXERIL) 10 MG tablet Take 1 tablet (10 mg total) by mouth 3 (three) times daily as needed. for muscle spams 30 tablet 3  . gemfibrozil (LOPID) 600 MG tablet Take 1 tablet (600 mg total) by mouth 2 (two) times daily before a meal. 180 tablet 0  . glucose blood test strip Use as instructed 100 each 12  . ibuprofen (ADVIL,MOTRIN) 200 MG tablet Take 400 mg by mouth 2 (two) times daily as needed for moderate pain.    Marland Kitchen losartan-hydrochlorothiazide (HYZAAR) 50-12.5 MG tablet Take 1 tablet by mouth daily. 90 tablet 1  . metFORMIN (GLUCOPHAGE) 500 MG tablet Take 1 tablet (500  mg total) by mouth 2 (two) times daily with a meal. 180 tablet 1  . Multiple Vitamins-Minerals (MULTIVITAMIN WITH MINERALS) tablet Take 1 tablet by mouth daily. Reported on 12/20/2015    . pantoprazole (PROTONIX) 40 MG tablet TAKE 1 TABLET BY MOUTH TWICE DAILY BEFORE MEAL(S) 60 tablet 2  . venlafaxine XR (EFFEXOR-XR) 75 MG 24 hr capsule Take 1 capsule (75 mg total) by mouth daily with breakfast. 90 capsule 1   Current Facility-Administered Medications  Medication Dose Route Frequency Provider Last Rate Last Dose  . 0.9 %  sodium chloride infusion  500 mL Intravenous Continuous Lucio Edward T, MD      . 0.9 %  sodium chloride infusion  500 mL Intravenous Continuous Ladene Artist, MD        Allergies: Patient has no known allergies.  Past Medical History:  Diagnosis Date  . Allergy   . Anemia    as a child  . Anxiety   . Arthritis    "lower back" (01/25/2016)  . Bilateral breast cancer ([place])    Archie Endo 01/25/2016  . Cataract   . Depression   . Esophageal diverticulum   . GERD (gastroesophageal reflux disease)   . Hyperlipidemia   . Hypertension   . Leg cramps    takes Flexeril  . Migraine    "maybe monthly" (01/25/2016)  . Osteoporosis   . Pneumonia ~ 2005 X 1  . Primary cancer of 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 breast ([place]) 12/14/2015  . Rheumatic fever 1950s  Past Surgical History:  Procedure Laterality Date  . BREAST BIOPSY Bilateral 2016  . CATARACT EXTRACTION W/ INTRAOCULAR LENS  IMPLANT, BILATERAL Bilateral 05/2015-06/2015  . CESAREAN SECTION  1974; 1976  . COLONOSCOPY    . ESOPHAGEAL MANOMETRY N/A 03/10/2017   Procedure: ESOPHAGEAL MANOMETRY (EM);  Surgeon: Ladene Artist, MD;  [facility]: [facility];  Service: Endoscopy;  Laterality: N/A;  . EYE MUSCLE SURGERY Bilateral 1950s   for cross eyes as a child  . INSERTION OF MESH N/A 09/12/2015   Procedure: INSERTION OF MESH;  Surgeon: Erroll Luna, MD;  Location: [facility];  Service: General;  Laterality: N/A;  .  MASTECTOMY COMPLETE / SIMPLE W/ SENTINEL NODE BIOPSY Right 01/25/2016  . MASTECTOMY MODIFIED RADICAL Left 01/25/2016  . MASTECTOMY W/ SENTINEL NODE BIOPSY Bilateral 01/25/2016   Procedure: RIGHT MASTECTOMY WITH SENTINEL RIGHT LYMPH NODE BIOPSY, LEFT MODIFIED RADICAL MASTECTOMY;  Surgeon: Stark Klein, MD;  Location: [facility];  Service: General;  Laterality: Bilateral;  . TONSILLECTOMY  1950s  . UMBILICAL HERNIA REPAIR N/A 09/12/2015   Procedure: REPAIR OF INCISIONAL AND UMBILICAL HERNIA;  Surgeon: Erroll Luna, MD;  Location: [facility];  Service: General;  Laterality: N/A;    Family History  Problem Relation Age of Onset  . Healthy Mother   . Other Mother        +"throat polyps"; +smoker  . Lymphoma Father        d. 34  . Diabetes Father   . Heart Problems Father   . Other Father        "tumors on his kidneys"  . Breast cancer Sister        dx. metachronous bilateral breast cancers at 5 and 86; - BRCA1/2 testing  . Cancer Maternal Grandmother 64       dx. spinal cancer  . Stroke Maternal Grandfather   . Gout Paternal Grandmother   . Gout Paternal Grandfather   . Lung cancer Maternal Uncle        d. 70-72; +smoker  . Diabetes Paternal Aunt   . Diabetes Paternal Uncle   . Breast cancer Cousin        maternal 1st cousin dx. unspecified age  . Lung cancer Cousin        maternal 1st cousin dx. lung cancer; +smoker  . Lupus Cousin   . Breast cancer Cousin        paternal 1st cousin dx. 59s  . Breast cancer Cousin        paternal 1st cousin dx. 38s-30s  . Cancer Cousin        paternal 1st cousin dx. oral cancer with metastasis, also "lost a leg to cancer"; dx. 17s; +EtOH abuse  . Breast cancer Cousin        paternal 1st cousin, once-removed dx. at unspecified age  . Colon cancer Neg Hx   . Esophageal cancer Neg Hx   . Rectal cancer Neg Hx   . Stomach cancer Neg Hx     Social History   Tobacco Use  . Smoking status: Former Smoker    Packs/day: 1.00    Years: 0.00    Pack years:  0.00    Types: Cigarettes    Last attempt to quit: 12/24/2015    Years since quitting: 2.2  . Smokeless tobacco: Never Used  Substance Use Topics  . Alcohol use: No    Alcohol/week: 0.0 oz    Comment: 07/31/16 pt states she dosn't drink anymore,04/16/2016 "1/2 bottle wine per month" previously -  not much now    Subjective:  Patient presents with concerns for medication reaction; she notes she has not been able to get her Effexor refilled and has been off the medication x 5 days with no taper; feeling dizzy, nauseous and light-headed; notes that her appetite has been down; denies any chest pain, shortness of breath or blurred vision; notes that she feels like she has the flu and is "just out of it."  Objective:  Vitals:   03/23/18 1049  BP: 128/78  Pulse: 72  Temp: 98.8 F (37.1 C)  TempSrc: Oral  SpO2: 93%  Weight: 152 lb 1.3 oz (69 kg)  Height: 5' (1.524 m)    General: Well developed, well nourished, in no acute distress  Skin : Warm and dry.  Head: Normocephalic and atraumatic  Eyes: Sclera and conjunctiva clear; pupils round and reactive to light; extraocular movements intact  Ears: External normal; canals clear; tympanic membranes normal  Oropharynx: Pink, supple. No suspicious lesions  Neck: Supple without thyromegaly, adenopathy  Lungs: Respirations unlabored; clear to auscultation bilaterally without wheeze, rales, rhonchi  CVS exam: normal rate and regular rhythm.  Abdomen: Soft; nontender; nondistended; normoactive bowel sounds; no masses or hepatosplenomegaly  Musculoskeletal: No deformities; no active joint inflammation  Extremities: No edema, cyanosis, clubbing  Vessels: Symmetric bilaterally  Neurologic: Alert and oriented; speech intact; face symmetrical; moves all extremities well; CNII-XII intact without focal deficit  Assessment:  1. Anxiety   2. Current moderate episode of major depressive disorder, unspecified whether recurrent ([place])   3. Essential hypertension    4. Esophageal dysphagia   5. Type 2 diabetes mellitus without complication, without long-term current use of insulin ([place])     Plan:   1. & 2. Suspect symptoms are withdrawal from Effexor; refill updated; stressed to patient to call if she ever can't get a medication refilled when she was told it would be sent to the pharmacy; she will call back in 48 hours if symptoms still present; 3. Stable; refill updated; 4. Stable; refill updated; will need magnesium checked with next set of labs; 5. Stable; last Hgba1c at 6.2; plan to re-check at her CPE in July; did not feel up to labs today;   No follow-ups on file.  No orders of the defined types were placed in this encounter.   Requested Prescriptions   Signed Prescriptions Disp Refills  . metFORMIN (GLUCOPHAGE) 500 MG tablet 180 tablet 1    Sig: Take 1 tablet (500 mg total) by mouth 2 (two) times daily with a meal.  . pantoprazole (PROTONIX) 40 MG tablet 60 tablet 2    Sig: TAKE 1 TABLET BY MOUTH TWICE DAILY BEFORE MEAL(S)  . losartan-hydrochlorothiazide (HYZAAR) 50-12.5 MG tablet 90 tablet 1    Sig: Take 1 tablet by mouth daily.  Marland Kitchen venlafaxine XR (EFFEXOR-XR) 75 MG 24 hr capsule 90 capsule 1    Sig: Take 1 capsule (75 mg total) by mouth daily with breakfast.

## 2018-05-04 ENCOUNTER — Other Ambulatory Visit: Payer: Self-pay | Admitting: *Deleted

## 2018-05-04 DIAGNOSIS — Z170 Estrogen receptor positive status [ER+]: Principal | ICD-10-CM

## 2018-05-04 DIAGNOSIS — C50212 Malignant neoplasm of upper-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 breast: Secondary | ICD-10-CM

## 2018-05-04 NOTE — Progress Notes (Signed)
[place]  [phone] [phone]) [phone]     ID: Kristen Howe DOB: 1946/06/25  MR#: [id number]  QIW#:[id number]  Patient Care Team: Lance Sell, NP as PCP - General (Nurse Practitioner) Zaviyar Rahal, Virgie Dad, MD as Consulting Physician (Oncology) Kyung Rudd, MD as Consulting Physician (Radiation Oncology) Stark Klein, MD as Consulting Physician (General Surgery) PCP: Lance Sell, NP GYN: OTHER MD:  CHIEF COMPLAINT: bilateral breast cancer  CURRENT TREATMENT: Anastrozole, denosumab/Prolia   BREAST CANCER HISTORY: From the original intake note:  "Kristen Howe" herself palpated a mass in her right breast sometime in November 2016. She brought it to Dr. Purcell Nails attention on 11/20/2015 and he set her up for bilateral diagnostic mammography with tomography and I lateral breast [facility]. The breast density was category B. In the right breast upper outer quadrant there where 2 nodules, at the 9:00 position (which by ultrasound was spiculated and measured 1.7 cm) and at the 11:00 position (which by ultrasound measured 0.7 cm. The right axilla showed a grossly abnormal lymph node measuring 1.4 cm with complete effacement of the hilum.there were several other small lymph nodes with cortical thickening in the lower right axilla as well.  Biopsy of the right breast mass on 12/12/2015 showed ([id number]) and invasive ductal carcinoma, grade 2, estrogen receptor 100% positive, progesterone receptor 80% positive, both with strong staining intensity, with an MIB-1 of 20%, and HER-2 nonamplified, with a signals ratio of 1.33 and number per cell 3.25.  In the left breast mammographically there was a multifocal mass centered at the 12:00 location with suspicious calcifications. The processes measured 6.7 cm altogether. A dilated duct extended to the nipple from this area, containing microcalcifications. Ultrasonography of  the left breast found a multifocal fragmented mass superiorly, the main component being hypoechoic and irregular and tolerable than wide, measuring 2.1 cm.superior to that, there was a second mass measuring 1.9 cm and there were other suspicious solid nodules in the adjacent parenchyma. There was also a separate area of microcalcifications containing a suspicious nodule at the 1:00 position. This measured 0.8 cm. The ultrasound confirmed a dilated duct extending from the superior process to the nipple. An alteration of the left axilla showed a grossly abnormal lymph node measuring up to 1 cm and a second indeterminant lymph node.  Biopsy of the left breast 11:30 o'clock massshowed invasive ductal carcinoma, grade 2, estrogen receptor 100% positive, progesterone receptor 70% positive, with an MIB-1 of 20%, and no HER-2 amplification, the signals ratio being 1.59 and the number per cell 3.90. A second area in the breast described as retroareolar showed ductal carcinoma, possibly in situ. This was also estrogen receptor positive at 100%, progesterone receptor positive at 60%, with an MIB-1 of 5%, and HER-2 negative, with a signals ratio of 1.73 and number per cell 3.55. Biopsy of the left breast lymph node was positiveand the prognostic panel there was very similar to all the others, namely estrogen receptor 100% positive, progesterone receptor 40% positive, with an MIB-1 of 40%, and HER-2 no amplification, with a signals ratio of 1.52 and number per cell 3.85.  The patient's subsequent history is as detailed below.  INTERVAL HISTORY: Kristen Howe returns today for follow-up of her estrogen receptor positive breast cancer accompanied by her sister. She continues on anastrozole, with good tolerance. She denies issues with hot flashes. There has been no change in her issue with vaginal dryness.  She also receives denosumab/ Prolia every 6  months, with a dose due today. She tolerates this well with no complications.     REVIEW OF SYSTEMS: Kristen Howe reports that she climbs the stairs for exercise. At home is her sister and 2 nephews. She denies unusual headaches, visual changes, nausea, vomiting, or dizziness. There has been no unusual cough, phlegm production, or pleurisy. This been no change in bowel or bladder habits. She denies unexplained fatigue or unexplained weight loss, bleeding, rash, or fever. A detailed review of systems was otherwise stable.    PAST MEDICAL HISTORY: Past Medical History:  Diagnosis Date  . Allergy   . Anemia    as a child  . Anxiety   . Arthritis    "lower back" (01/25/2016)  . Bilateral breast cancer ([place])    [facility] Endo 01/25/2016  . Cataract   . Depression   . Esophageal diverticulum   . GERD (gastroesophageal reflux disease)   . Hyperlipidemia   . Hypertension   . Leg cramps    takes Flexeril  . Migraine    "maybe monthly" (01/25/2016)  . Osteoporosis   . Pneumonia ~ 2005 X 1  . Primary cancer of 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 breast ([place]) 12/14/2015  . Rheumatic fever 1950s    PAST SURGICAL HISTORY: Past Surgical History:  Procedure Laterality Date  . BREAST BIOPSY Bilateral 2016  . CATARACT EXTRACTION W/ INTRAOCULAR LENS  IMPLANT, BILATERAL Bilateral 05/2015-06/2015  . CESAREAN SECTION  1974; 1976  . COLONOSCOPY    . ESOPHAGEAL MANOMETRY N/A 03/10/2017   Procedure: ESOPHAGEAL MANOMETRY (EM);  Surgeon: Ladene Artist, MD;  Location: [facility];  Service: Endoscopy;  Laterality: N/A;  . EYE MUSCLE SURGERY Bilateral 1950s   for cross eyes as a child  . INSERTION OF MESH N/A 09/12/2015   Procedure: INSERTION OF MESH;  Surgeon: Erroll Luna, MD;  Location: [facility];  Service: General;  Laterality: N/A;  . MASTECTOMY COMPLETE / SIMPLE W/ SENTINEL NODE BIOPSY Right 01/25/2016  . MASTECTOMY MODIFIED RADICAL Left 01/25/2016  . MASTECTOMY W/ SENTINEL NODE BIOPSY Bilateral 01/25/2016   Procedure: RIGHT MASTECTOMY WITH SENTINEL RIGHT LYMPH NODE BIOPSY, LEFT MODIFIED RADICAL  MASTECTOMY;  Surgeon: Stark Klein, MD;  Location: [facility];  Service: General;  Laterality: Bilateral;  . TONSILLECTOMY  1950s  . UMBILICAL HERNIA REPAIR N/A 09/12/2015   Procedure: REPAIR OF INCISIONAL AND UMBILICAL HERNIA;  Surgeon: Erroll Luna, MD;  Location: [facility];  Service: General;  Laterality: N/A;    FAMILY HISTORY Family History  Problem Relation Age of Onset  . Healthy Mother   . Other Mother        +"throat polyps"; +smoker  . Lymphoma Father        d. 57  . Diabetes Father   . Heart Problems Father   . Other Father        "tumors on his kidneys"  . Breast cancer Sister        dx. metachronous bilateral breast cancers at 23 and 20; - BRCA1/2 testing  . Cancer Maternal Grandmother 42       dx. spinal cancer  . Stroke Maternal Grandfather   . Gout Paternal Grandmother   . Gout Paternal Grandfather   . Lung cancer Maternal Uncle        d. 70-72; +smoker  . Diabetes Paternal Aunt   . Diabetes Paternal Uncle   . Breast cancer Cousin        maternal 1st cousin dx. unspecified age  . Lung cancer Cousin  maternal 1st cousin dx. lung cancer; +smoker  . Lupus Cousin   . Breast cancer Cousin        paternal 1st cousin dx. 57s  . Breast cancer Cousin        paternal 1st cousin dx. 28s-30s  . Cancer Cousin        paternal 1st cousin dx. oral cancer with metastasis, also "lost a leg to cancer"; dx. 19s; +EtOH abuse  . Breast cancer Cousin        paternal 1st cousin, once-removed dx. at unspecified age  . Colon cancer Neg Hx   . Esophageal cancer Neg Hx   . Rectal cancer Neg Hx   . Stomach cancer Neg Hx   the patient's father died at the age of 55 from complications of diabetes. He had a history of lymphoma. The patient's mother is currently living at age 42. The patient's brother died in an automobile accident. The patient has a twin sister,  -Animator. She has a history of bilateral breast cancers and did undergo genetic testing in November 2012, with  no BRCA1 or 2 mutation found  GYNECOLOGIC HISTORY:  No LMP recorded. Patient is postmenopausal. Menarche age 36, first live birth age 61. The patient is GX P2. She stopped having periods approximately 1997. She did not take hormone replacement. She took oral contraceptives remotely for approximately 3 years, with no complications.  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 She is divorced and lives at home with her sister Joycelyn Schmid, with their mother, and with 2 nephews, Rush Landmark, who is not employed, and Corene Cornea, who is disabled. The patient has 3 grandchildren. She is not a church attender    ADVANCED DIRECTIVES: Not in place. At the initial clinic visit  the patient was given the appropriate forms to complete and notarize at her discretion.  HEALTH MAINTENANCE: Social History   Tobacco Use  . Smoking status: Former Smoker    Packs/day: 1.00    Years: 0.00    Pack years: 0.00    Types: Cigarettes    Last attempt to quit: 12/24/2015    Years since quitting: 2.3  . Smokeless tobacco: Never Used  Substance Use Topics  . Alcohol use: No    Alcohol/week: 0.0 oz    Comment: 07/31/16 pt states she dosn't drink anymore,04/16/2016 "1/2 bottle wine per month" previously - not much now  . Drug use: No     Colonoscopy: 09/06/2016  PAP: 2010  Bone density: 07/26/2016 showed a T score of -2.5 osteoporosis   Lipid panel:  No Known Allergies  Current Outpatient Medications  Medication Sig Dispense Refill  . anastrozole (ARIMIDEX) 1 MG tablet Take 1 tablet (1 mg total) by mouth daily. 90 tablet 4  . Artificial Tear Ointment (DRY EYES OP) Apply 1 drop to eye daily as needed (for dry eyes).     . Calcium-Phosphorus-Vitamin D (CITRACAL +D3 PO) Take 2 tablets by mouth 2 (two) times daily.     . cyclobenzaprine (FLEXERIL) 10 MG tablet Take 1 tablet (10 mg total) by mouth 3 (three) times daily as needed. for muscle spams 30 tablet 3  . gemfibrozil  (LOPID) 600 MG tablet Take 1 tablet (600 mg total) by mouth 2 (two) times daily before a meal. 180 tablet 0  . glucose blood test strip Use as instructed 100 each 12  . ibuprofen (ADVIL,MOTRIN) 200 MG tablet Take 400 mg  by mouth 2 (two) times daily as needed for moderate pain.    Marland Kitchen losartan-hydrochlorothiazide (HYZAAR) 50-12.5 MG tablet Take 1 tablet by mouth daily. 90 tablet 1  . metFORMIN (GLUCOPHAGE) 500 MG tablet Take 1 tablet (500 mg total) by mouth 2 (two) times daily with a meal. 180 tablet 1  . Multiple Vitamins-Minerals (MULTIVITAMIN WITH MINERALS) tablet Take 1 tablet by mouth daily. Reported on 12/20/2015    . pantoprazole (PROTONIX) 40 MG tablet TAKE 1 TABLET BY MOUTH TWICE DAILY BEFORE MEAL(S) 60 tablet 2  . venlafaxine XR (EFFEXOR-XR) 75 MG 24 hr capsule Take 1 capsule (75 mg total) by mouth daily with breakfast. 90 capsule 1   Current Facility-Administered Medications  Medication Dose Route Frequency Provider Last Rate Last Dose  . 0.9 %  sodium chloride infusion  500 mL Intravenous Continuous Lucio Edward T, MD      . 0.9 %  sodium chloride infusion  500 mL Intravenous Continuous Ladene Artist, MD        OBJECTIVE: middle-aged white woman in no acute distress  Vitals:   05/05/18 1320  BP: 122/67  Pulse: 65  Resp: 18  Temp: 98.7 F (37.1 C)  SpO2: 95%     Body mass index is 30.23 kg/m.    ECOG FS:0 - Asymptomatic  Sclerae unicteric, EOMs intact Oropharynx clear and moist No cervical or supraclavicular adenopathy Lungs no rales or rhonchi Heart regular rate and rhythm Abd soft, nontender, positive bowel sounds MSK mild kyphosis and scoliosis but no focal spinal tenderness, no upper extremity lymphedema Neuro: nonfocal, well oriented, appropriate affect Breasts: Status post bilateral mastectomies.  No evidence of chest wall recurrence.  Both axillae are benign.  LAB RESULTS:  CMP     Component Value Date/Time   NA 141 05/05/2018 1244   NA 141 11/04/2017  0952   K 4.4 05/05/2018 1244   K 3.9 11/04/2017 0952   CL 107 05/05/2018 1244   CO2 25 05/05/2018 1244   CO2 26 11/04/2017 0952   GLUCOSE 85 05/05/2018 1244   GLUCOSE 72 11/04/2017 0952   BUN 19 05/05/2018 1244   BUN 20.5 11/04/2017 0952   CREATININE 0.84 05/05/2018 1244   CREATININE 0.9 11/04/2017 0952   CALCIUM 10.1 05/05/2018 1244   CALCIUM 10.7 (H) 11/04/2017 0952   PROT 7.4 05/05/2018 1244   PROT 8.0 11/04/2017 0952   ALBUMIN 4.0 05/05/2018 1244   ALBUMIN 3.9 11/04/2017 0952   AST 16 05/05/2018 1244   AST 29 11/04/2017 0952   ALT 21 05/05/2018 1244   ALT 37 11/04/2017 0952   ALKPHOS 67 05/05/2018 1244   ALKPHOS 81 11/04/2017 0952   BILITOT 0.3 05/05/2018 1244   BILITOT 0.34 11/04/2017 0952   GFRNONAA >60 05/05/2018 1244   GFRAA >60 05/05/2018 1244    INo results found for: SPEP, UPEP  Lab Results  Component Value Date   WBC 5.8 05/05/2018   NEUTROABS 3.8 05/05/2018   HGB 12.2 05/05/2018   HCT 36.4 05/05/2018   MCV 86.4 05/05/2018   PLT 294 05/05/2018      Chemistry      Component Value Date/Time   NA 141 05/05/2018 1244   NA 141 11/04/2017 0952   K 4.4 05/05/2018 1244   K 3.9 11/04/2017 0952   CL 107 05/05/2018 1244   CO2 25 05/05/2018 1244   CO2 26 11/04/2017 0952   BUN 19 05/05/2018 1244   BUN 20.5 11/04/2017 0952   CREATININE 0.84 05/05/2018 1244  CREATININE 0.9 11/04/2017 0952      Component Value Date/Time   CALCIUM 10.1 05/05/2018 1244   CALCIUM 10.7 (H) 11/04/2017 0952   ALKPHOS 67 05/05/2018 1244   ALKPHOS 81 11/04/2017 0952   AST 16 05/05/2018 1244   AST 29 11/04/2017 0952   ALT 21 05/05/2018 1244   ALT 37 11/04/2017 0952   BILITOT 0.3 05/05/2018 1244   BILITOT 0.34 11/04/2017 0952       No results found for: LABCA2  No components found for: LABCA125  No results for input(s): INR in the last 168 hours.  Urinalysis    Component Value Date/Time   COLORURINE YELLOW 01/17/2016 0910   APPEARANCEUR CLEAR 01/17/2016 0910    LABSPEC 1.022 01/17/2016 0910   PHURINE 6.0 01/17/2016 0910   GLUCOSEU NEGATIVE 01/17/2016 0910   HGBUR NEGATIVE 01/17/2016 0910   BILIRUBINUR NEGATIVE 01/17/2016 0910   KETONESUR NEGATIVE 01/17/2016 0910   PROTEINUR NEGATIVE 01/17/2016 0910   NITRITE NEGATIVE 01/17/2016 0910   LEUKOCYTESUR TRACE (A) 01/17/2016 0910    STUDIES: No results found.   ASSESSMENT: 73 y.o. BRCA negative [place] woman  (1) status post bilateral breast biopsies and left axillary lymph node biopsy 12/12/2015, showing  (a) on the Right upper-outer quadrant, a clinical mT1c N1, stage IIA, estrogen and progesterone receptor positive, HER-2 not amplified, with an MIB-1 of 20%  (b) on the left upper inner quadrant a clinical mT2 pN1, stage IIB invasive ductal carcinoma, grade 2 over 3, estrogen and progesterone receptor positive, HER-2 not amplified, with MIB-1 between 5 and 40%  (c) chest CT and bone scan 01/04/2016 showed no evidence of metastatic disease. They did suggest a large esophageal diverticulum  (2) Status post right mastectomy with sentinel lymph node sampling and left modified radical mastectomy 01/25/2016  (a) right side: pT2, pN1, stage IIB invasive ductal carcinoma, grade 2, with negative margins  (b) left side: pT3 pN1, stage IIIA invasive ductal carcinoma, grade 2, with negative margins  (c) both cancers were estrogen and progesterone receptor positive, HER-2 nonamplified, with MIB-1 between 5 and 20%   (3) Mammaprint on both breast cancers came back "low risk" predicting a 5 year distant metastasis free survival in the 96% range   (4) no chemotherapy and no right-sided axillary lymph node dissection recommended at conference 02/21/2016  (5) adjuvant radiation completed 05/06/2016   (6) anastrozole started 06/18/2016  (a) bone density 07/26/2016 shows osteoporosis with a T score of -2.5.  (b)  denosumab/Prolia started 09/04/2016  (7) genetics testing 04/26/2016 through the [place] [facility] through [employer], MD) found no deleterious mutations in ATM, BARD1, BRCA1, BRCA2, BRIP1, CDH1, CHEK2, FANCC, MLH1, MSH2, MSH6, NBN, PALB2, PMS2, PTEN, RAD51C, RAD51D, TP53, and XRCC2. This panel also includes deletion/duplication analysis (without sequencing) for one gene, EPCAM.  PLAN: Kristen Howe is now just over 2 years out from definitive surgery for breast cancer with no evidence of disease recurrence.  This is very favorable.  She is tolerating anastrozole well and the plan will be to continue that for a total of 5 years.  She continues to receive Prolia every 6 months.  She tolerates that well.  We will repeat a bone density scan before she returns to see me in a year from now  She knows to call for any other issues that may develop before the next visit  Kobie Whidby, Virgie Dad, MD  05/05/18 1:49 PM Medical Oncology and Hematology [facility] [place],  [place] [address] Tel. [phone]    Fax. [phone]  This document serves as a record of services personally performed by Lurline Del, MD. It was created on his behalf by Sheron Nightingale, a trained medical scribe. The creation of this record is based on the scribe's personal observations and the provider's statements to them.   I have reviewed the above documentation for accuracy and completeness, and I agree with the above.

## 2018-05-05 ENCOUNTER — Inpatient Hospital Stay: Payer: Medicare Other

## 2018-05-05 ENCOUNTER — Inpatient Hospital Stay: Payer: Medicare Other | Attending: Oncology | Admitting: Oncology

## 2018-05-05 ENCOUNTER — Telehealth: Payer: Self-pay | Admitting: Oncology

## 2018-05-05 VITALS — BP 122/67 | HR 65 | Temp 98.7°F | Resp 18 | Ht 60.0 in | Wt 154.8 lb

## 2018-05-05 DIAGNOSIS — C50411 Malignant neoplasm of upper-outer quadrant of right female breast: Secondary | ICD-10-CM | POA: Diagnosis not present

## 2018-05-05 DIAGNOSIS — N898 Other specified noninflammatory disorders of vagina: Secondary | ICD-10-CM

## 2018-05-05 DIAGNOSIS — F419 Anxiety disorder, unspecified: Secondary | ICD-10-CM

## 2018-05-05 DIAGNOSIS — F321 Major depressive disorder, single episode, moderate: Secondary | ICD-10-CM

## 2018-05-05 DIAGNOSIS — C773 Secondary and unspecified malignant neoplasm of axilla and upper limb lymph nodes: Secondary | ICD-10-CM

## 2018-05-05 DIAGNOSIS — Z170 Estrogen receptor positive status [ER+]: Secondary | ICD-10-CM

## 2018-05-05 DIAGNOSIS — M810 Age-related osteoporosis without current pathological fracture: Secondary | ICD-10-CM

## 2018-05-05 DIAGNOSIS — C50212 Malignant neoplasm of upper-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 breast: Secondary | ICD-10-CM

## 2018-05-05 LAB — CMP (CANCER CENTER ONLY)
ALBUMIN: 4 g/dL (ref 3.5–5.0)
ALK PHOS: 67 U/L (ref 40–150)
ALT: 21 U/L (ref 0–55)
AST: 16 U/L (ref 5–34)
Anion gap: 9 (ref 3–11)
BUN: 19 mg/dL (ref 7–26)
CALCIUM: 10.1 mg/dL (ref 8.4–10.4)
CO2: 25 mmol/L (ref 22–29)
CREATININE: 0.84 mg/dL (ref 0.60–1.10)
Chloride: 107 mmol/L (ref 98–109)
GFR, Est AFR Am: >60 mL/min (ref >60)
GLUCOSE: 85 mg/dL (ref 70–140)
Potassium: 4.4 mmol/L (ref 3.5–5.1)
Sodium: 141 mmol/L (ref 136–145)
TOTAL PROTEIN: 7.4 g/dL (ref 6.4–8.3)
Total Bilirubin: 0.3 mg/dL (ref 0.2–1.2)

## 2018-05-05 LAB — CBC WITH DIFFERENTIAL (CANCER CENTER ONLY)
BASOS ABS: 0.1 10*3/uL (ref 0.0–0.1)
BASOS PCT: 1 %
EOS ABS: 0.2 10*3/uL (ref 0.0–0.5)
EOS PCT: 3 %
HCT: 36.4 % (ref 34.8–46.6)
Hemoglobin: 12.2 g/dL (ref 11.6–15.9)
LYMPHS PCT: 22 %
Lymphs Abs: 1.3 10*3/uL (ref 0.9–3.3)
MCH: 28.8 pg (ref 25.1–34.0)
MCHC: 33.4 g/dL (ref 31.5–36.0)
MCV: 86.4 fL (ref 79.5–101.0)
Monocytes Absolute: 0.5 10*3/uL (ref 0.1–0.9)
Monocytes Relative: 9 %
Neutro Abs: 3.8 10*3/uL (ref 1.5–6.5)
Neutrophils Relative %: 65 %
PLATELETS: 294 10*3/uL (ref 145–400)
RBC: 4.22 MIL/uL (ref 3.70–5.45)
RDW: 14.1 % (ref 11.2–14.5)
WBC Count: 5.8 10*3/uL (ref 3.9–10.3)

## 2018-05-05 MED ORDER — DENOSUMAB 60 MG/ML ~~LOC~~ SOLN: 60.0000 mg | Freq: Once | SUBCUTANEOUS | Status: DC

## 2018-05-05 MED ORDER — DENOSUMAB 60 MG/ML ~~LOC~~ SOSY
60.0000 mg | PREFILLED_SYRINGE | Freq: Once | SUBCUTANEOUS | Status: AC
  Administered 2018-05-05: 60 mg via SUBCUTANEOUS

## 2018-05-05 MED ORDER — VENLAFAXINE HCL ER 75 MG PO CP24: 75.0000 mg | ORAL_CAPSULE | Freq: Every day | ORAL | 1 refills | Status: DC

## 2018-05-05 MED ORDER — ANASTROZOLE 1 MG PO TABS: 1.0000 mg | ORAL_TABLET | Freq: Every day | ORAL | 4 refills | Status: DC

## 2018-05-05 NOTE — Patient Instructions (Signed)
Denosumab injection  What is this medicine?  DENOSUMAB (den oh sue mab) slows bone breakdown. Prolia is used to treat osteoporosis in women after menopause and in men. Xgeva is used to prevent bone fractures and other bone problems caused by cancer bone metastases. Xgeva is also used to treat giant cell tumor of the bone.  This medicine may be used for other purposes; ask your health care provider or pharmacist if you have questions.  What should I tell my health care provider before I take this medicine?  They need to know if you have any of these conditions:  -dental disease  -eczema  -infection or history of infections  -kidney disease or on dialysis  -low blood calcium or vitamin D  -malabsorption syndrome  -scheduled to have surgery or tooth extraction  -taking medicine that contains denosumab  -thyroid or parathyroid disease  -an unusual reaction to denosumab, other medicines, foods, dyes, or preservatives  -pregnant or trying to get pregnant  -breast-feeding  How should I use this medicine?  This medicine is for injection under the skin. It is given by a health care professional in a hospital or clinic setting.  If you are getting Prolia, a special MedGuide will be given to you by the pharmacist with each prescription and refill. Be sure to read this information carefully each time.  For Prolia, talk to your pediatrician regarding the use of this medicine in children. Special care may be needed. For Xgeva, talk to your pediatrician regarding the use of this medicine in children. While this drug may be prescribed for children as young as 13 years for selected conditions, precautions do apply.  Overdosage: If you think you have taken too much of this medicine contact a poison control center or emergency room at once.  NOTE: This medicine is only for you. Do not share this medicine with others.  What if I miss a dose?  It is important not to miss your dose. Call your doctor or health care professional if you are  unable to keep an appointment.  What may interact with this medicine?  Do not take this medicine with any of the following medications:  -other medicines containing denosumab  This medicine may also interact with the following medications:  -medicines that suppress the immune system  -medicines that treat cancer  -steroid medicines like prednisone or cortisone  This list may not describe all possible interactions. Give your health care provider a list of all the medicines, herbs, non-prescription drugs, or dietary supplements you use. Also tell them if you smoke, drink alcohol, or use illegal drugs. Some items may interact with your medicine.  What should I watch for while using this medicine?  Visit your doctor or health care professional for regular checks on your progress. Your doctor or health care professional may order blood tests and other tests to see how you are doing.  Call your doctor or health care professional if you get a cold or other infection while receiving this medicine. Do not treat yourself. This medicine may decrease your body's ability to fight infection.  You should make sure you get enough calcium and vitamin D while you are taking this medicine, unless your doctor tells you not to. Discuss the foods you eat and the vitamins you take with your health care professional.  See your dentist regularly. Brush and floss your teeth as directed. Before you have any dental work done, tell your dentist you are receiving this medicine.  Do   not become pregnant while taking this medicine or for 5 months after stopping it. Women should inform their doctor if they wish to become pregnant or think they might be pregnant. There is a potential for serious side effects to an unborn child. Talk to your health care professional or pharmacist for more information.  What side effects may I notice from receiving this medicine?  Side effects that you should report to your doctor or health care professional as soon as  possible:  -allergic reactions like skin rash, itching or hives, swelling of the face, lips, or tongue  -breathing problems  -chest pain  -fast, irregular heartbeat  -feeling faint or lightheaded, falls  -fever, chills, or any other sign of infection  -muscle spasms, tightening, or twitches  -numbness or tingling  -skin blisters or bumps, or is dry, peels, or red  -slow healing or unexplained pain in the mouth or jaw  -unusual bleeding or bruising  Side effects that usually do not require medical attention (Report these to your doctor or health care professional if they continue or are bothersome.):  -muscle pain  -stomach upset, gas  This list may not describe all possible side effects. Call your doctor for medical advice about side effects. You may report side effects to FDA at 1-800-FDA-1088.  Where should I keep my medicine?  This medicine is only given in a clinic, doctor's office, or other health care setting and will not be stored at home.  NOTE: This sheet is a summary. It may not cover all possible information. If you have questions about this medicine, talk to your doctor, pharmacist, or health care provider.      2016, [employer] Standard. (2012-06-01 12:37:47)

## 2018-05-05 NOTE — Telephone Encounter (Signed)
Gave patient AVS and calendar of upcoming November and may 2020 appointments.

## 2018-06-17 ENCOUNTER — Encounter: Payer: Self-pay | Admitting: Nurse Practitioner

## 2018-06-17 ENCOUNTER — Ambulatory Visit (INDEPENDENT_AMBULATORY_CARE_PROVIDER_SITE_OTHER): Payer: Medicare Other | Admitting: Nurse Practitioner

## 2018-06-17 ENCOUNTER — Other Ambulatory Visit (INDEPENDENT_AMBULATORY_CARE_PROVIDER_SITE_OTHER): Payer: Medicare Other

## 2018-06-17 VITALS — BP 108/64 | HR 64 | Temp 98.7°F | Resp 16 | Ht 60.0 in | Wt 155.8 lb

## 2018-06-17 DIAGNOSIS — I10 Essential (primary) hypertension: Secondary | ICD-10-CM | POA: Diagnosis not present

## 2018-06-17 DIAGNOSIS — E781 Pure hyperglyceridemia: Secondary | ICD-10-CM

## 2018-06-17 DIAGNOSIS — E119 Type 2 diabetes mellitus without complications: Secondary | ICD-10-CM | POA: Diagnosis not present

## 2018-06-17 LAB — LIPID PANEL
Cholesterol: 202 mg/dL — ABNORMAL HIGH (ref 0–200)
HDL: 37 mg/dL — ABNORMAL LOW (ref >39.00)
NonHDL: 164.76
TRIGLYCERIDES: 241 mg/dL — AB (ref 0.0–149.0)
Total CHOL/HDL Ratio: 5
VLDL: 48.2 mg/dL — ABNORMAL HIGH (ref 0.0–40.0)

## 2018-06-17 LAB — LDL CHOLESTEROL, DIRECT: Direct LDL: 137 mg/dL

## 2018-06-17 LAB — HEMOGLOBIN A1C: Hgb A1c MFr Bld: 6.4 % (ref 4.6–6.5)

## 2018-06-17 NOTE — Assessment & Plan Note (Signed)
Stable Continue current medications Continue to monitor 

## 2018-06-17 NOTE — Assessment & Plan Note (Signed)
Continue metformin at current dosage Update labs - Hemoglobin A1c; Future - HM DIABETES FOOT EXAM; Future- We discussed routine foot inspection at home, wearing supportive shoes, and avoiding going barefoot. - Ambulatory referral to Ophthalmology

## 2018-06-17 NOTE — Assessment & Plan Note (Signed)
Continue gemfibrozil Update labs F/U with further recommendations pending lab results She is not fasting, ate toast prior to appointment today - Lipid panel; Future

## 2018-06-17 NOTE — Progress Notes (Signed)
Name: Kristen Howe   MRN: [id number]    DOB: June 29, 1946   Date:06/17/2018       Progress Note  Subjective  Chief Complaint  Chief Complaint  Patient presents with  . Follow-up    diabetes    HPI Will also follow up on HTN, HLD.  Diabetes- maintained on metformin 500 BID Reports daily medication compliance without adverse medication effects. She routinely checks her blood sugars at home with recent readings in the 120s. Denies tremor, diaphoresis, polyuria, polydipsia.  Lab Results  Component Value Date   HGBA1C 6.2 12/18/2017    Hypertension -maintained on losartan-hctz 50-12.5 daily Reports daily medication compliance without adverse medication effects. Reports she does not routinely check her blood pressure readings at home. Denies headaches, vision changes, chest pain, shortness of breath, edema.  BP Readings from Last 3 Encounters:  06/17/18 108/64  05/05/18 122/67  03/23/18 128/78   Cholesterol- maintained on gemfibrozil 600 BID. She has been taking the gemfibrozil for about 5 years now, Reports daily medication compliance without adverse medication effects. She says she has not been on a statin or any other medication for her cholesterol in the past.  Lab Results  Component Value Date   CHOL 196 09/20/2016   HDL 45.00 09/20/2016   LDLCALC 128 (H) 09/20/2016   TRIG 114.0 09/20/2016   CHOLHDL 4 09/20/2016     Patient Active Problem List   Diagnosis Date Noted  . Diabetes mellitus ([place]) 10/07/2017  . Patellofemoral pain syndrome of both knees 10/03/2017  . Anxiety 09/29/2017  . Current moderate episode of major depressive disorder ([place]) 09/29/2017  . Esophageal diverticulum 04/14/2017  . Esophageal motility disorder 04/14/2017  . Esophageal dysphagia   . Hypertriglyceridemia 09/03/2016  . Medicare annual wellness visit, subsequent 09/03/2016  . Overweight (BMI 25.0-29.9) 09/03/2016  . Osteoporosis 08/28/2016  . Bilateral breast cancer ([place]) 01/25/2016   . Tobacco abuse 12/22/2015  . Malignant neoplasm of upper-inner quadrant of left breast in female, estrogen receptor positive ([place]) 12/14/2015  . Lump in female breast 11/20/2015  . Umbilical hernia without obstruction and without gangrene 07/28/2015  . Essential hypertension 07/28/2015  . Seborrheic keratosis 07/28/2015    Past Surgical History:  Procedure Laterality Date  . BREAST BIOPSY Bilateral 2016  . CATARACT EXTRACTION W/ INTRAOCULAR LENS  IMPLANT, BILATERAL Bilateral 05/2015-06/2015  . CESAREAN SECTION  1974; 1976  . COLONOSCOPY    . ESOPHAGEAL MANOMETRY N/A 03/10/2017   Procedure: ESOPHAGEAL MANOMETRY (EM);  Surgeon: Ladene Artist, MD;  Location: [facility];  Service: Endoscopy;  Laterality: N/A;  . EYE MUSCLE SURGERY Bilateral 1950s   for cross eyes as a child  . INSERTION OF MESH N/A 09/12/2015   Procedure: INSERTION OF MESH;  Surgeon: Erroll Luna, MD;  Location: [facility];  Service: General;  Laterality: N/A;  . MASTECTOMY COMPLETE / SIMPLE W/ SENTINEL NODE BIOPSY Right 01/25/2016  . MASTECTOMY MODIFIED RADICAL Left 01/25/2016  . MASTECTOMY W/ SENTINEL NODE BIOPSY Bilateral 01/25/2016   Procedure: RIGHT MASTECTOMY WITH SENTINEL RIGHT LYMPH NODE BIOPSY, LEFT MODIFIED RADICAL MASTECTOMY;  Surgeon: Stark Klein, MD;  Location: [facility];  Service: General;  Laterality: Bilateral;  . TONSILLECTOMY  1950s  . UMBILICAL HERNIA REPAIR N/A 09/12/2015   Procedure: REPAIR OF INCISIONAL AND UMBILICAL HERNIA;  Surgeon: Erroll Luna, MD;  Location: [facility];  Service: General;  Laterality: N/A;    Family History  Problem Relation Age of Onset  . Healthy Mother   . Other Mother        +"  throat polyps"; +smoker  . Lymphoma Father        d. 72  . Diabetes Father   . Heart Problems Father   . Other Father        "tumors on his kidneys"  . Breast cancer Sister        dx. metachronous bilateral breast cancers at 22 and 44; - BRCA1/2 testing  . Cancer Maternal Grandmother 93       dx. spinal  cancer  . Stroke Maternal Grandfather   . Gout Paternal Grandmother   . Gout Paternal Grandfather   . Lung cancer Maternal Uncle        d. 70-72; +smoker  . Diabetes Paternal Aunt   . Diabetes Paternal Uncle   . Breast cancer Cousin        maternal 1st cousin dx. unspecified age  . Lung cancer Cousin        maternal 1st cousin dx. lung cancer; +smoker  . Lupus Cousin   . Breast cancer Cousin        paternal 1st cousin dx. 81s  . Breast cancer Cousin        paternal 1st cousin dx. 70s-30s  . Cancer Cousin        paternal 1st cousin dx. oral cancer with metastasis, also "lost a leg to cancer"; dx. 74s; +EtOH abuse  . Breast cancer Cousin        paternal 1st cousin, once-removed dx. at unspecified age  . Colon cancer Neg Hx   . Esophageal cancer Neg Hx   . Rectal cancer Neg Hx   . Stomach cancer Neg Hx     Social History   Socioeconomic History  . Marital status: Divorced    Spouse name: Not on file  . Number of children: 2  . Years of education: 1  . Highest education level: Not on file  Occupational History  . Occupation: retired  Scientific laboratory technician  . Financial resource strain: Not hard at all  . Food insecurity:    Worry: Never true    Inability: Never true  . Transportation needs:    Medical: No    Non-medical: No  Tobacco Use  . Smoking status: Former Smoker    Packs/day: 1.00    Years: 0.00    Pack years: 0.00    Types: Cigarettes    Last attempt to quit: 12/24/2015    Years since quitting: 2.4  . Smokeless tobacco: Never Used  Substance and Sexual Activity  . Alcohol use: No    Alcohol/week: 0.0 oz    Comment: 07/31/16 pt states she dosn't drink anymore,04/16/2016 "1/2 bottle wine per month" previously - not much now  . Drug use: No  . Sexual activity: Never  Lifestyle  . Physical activity:    Days per week: Not on file    Minutes per session: Not on file  . Stress: Not on file  Relationships  . Social connections:    Talks on phone: Not on file    Gets  together: Not on file    Attends religious service: Not on file    Active member of club or organization: Not on file    Attends meetings of clubs or organizations: Not on file    Relationship status: Not on file  . Intimate partner violence:    Fear of current or ex partner: Not on file    Emotionally abused: Not on file    Physically abused: Not on file  Forced sexual activity: Not on file  Other Topics Concern  . Not on file  Social History Narrative   Lives at home with sister and nephews.   Fun: Internet; games, talk to people, video games   Denies religious beliefs effecting health care.   Denies abuse and feels safe at home.      Current Outpatient Medications:  .  anastrozole (ARIMIDEX) 1 MG tablet, Take 1 tablet (1 mg total) by mouth daily., Disp: 90 tablet, Rfl: 4 .  Artificial Tear Ointment (DRY EYES OP), Apply 1 drop to eye daily as needed (for dry eyes). , Disp: , Rfl:  .  Calcium-Phosphorus-Vitamin D (CITRACAL +D3 PO), Take 2 tablets by mouth 2 (two) times daily. , Disp: , Rfl:  .  cyclobenzaprine (FLEXERIL) 10 MG tablet, Take 1 tablet (10 mg total) by mouth 3 (three) times daily as needed. for muscle spams, Disp: 30 tablet, Rfl: 3 .  gemfibrozil (LOPID) 600 MG tablet, Take 1 tablet (600 mg total) by mouth 2 (two) times daily before a meal., Disp: 180 tablet, Rfl: 0 .  glucose blood test strip, Use as instructed, Disp: 100 each, Rfl: 12 .  ibuprofen (ADVIL,MOTRIN) 200 MG tablet, Take 400 mg by mouth 2 (two) times daily as needed for moderate pain., Disp: , Rfl:  .  losartan-hydrochlorothiazide (HYZAAR) 50-12.5 MG tablet, Take 1 tablet by mouth daily., Disp: 90 tablet, Rfl: 1 .  metFORMIN (GLUCOPHAGE) 500 MG tablet, Take 1 tablet (500 mg total) by mouth 2 (two) times daily with a meal., Disp: 180 tablet, Rfl: 1 .  Multiple Vitamins-Minerals (MULTIVITAMIN WITH MINERALS) tablet, Take 1 tablet by mouth daily. Reported on 12/20/2015, Disp: , Rfl:  .  pantoprazole (PROTONIX) 40  MG tablet, TAKE 1 TABLET BY MOUTH TWICE DAILY BEFORE MEAL(S), Disp: 60 tablet, Rfl: 2 .  venlafaxine XR (EFFEXOR-XR) 75 MG 24 hr capsule, Take 1 capsule (75 mg total) by mouth daily with breakfast., Disp: 90 capsule, Rfl: 1  No Known Allergies   ROS See HPI  Objective  Vitals:   06/17/18 0958  BP: 108/64  Pulse: 64  Resp: 16  Temp: 98.7 F (37.1 C)  TempSrc: Oral  SpO2: 95%  Weight: 155 lb 12.8 oz (70.7 kg)  Height: 5' (1.524 m)    Body mass index is 30.43 kg/m.  Physical Exam Vital signs reviewed. Constitutional: Patient appears well-developed and well-nourished. No distress.  HENT: Head: Normocephalic and atraumatic. Nose: Nose normal. Mouth/Throat: Oropharynx is clear and moist. No oropharyngeal exudate.  Eyes: Conjunctivae and EOM are normal. Pupils are equal, round, and reactive to light. No scleral icterus.  Neck: Normal range of motion. Neck supple.  Cardiovascular: Normal rate, regular rhythm and normal heart sounds. No BLE edema. Distal pulses intact. Pulmonary/Chest: Effort normal and breath sounds normal. No respiratory distress. Musculoskeletal: Normal range of motion,  No gross deformities Neurological:She is alert and oriented to person, place, and time. No cranial nerve deficit. Coordination, balance, strength, speech and gait are normal.  Skin: Skin is warm and dry. No rash noted. No erythema.  Psychiatric: Patient has a normal mood and affect. behavior is normal. Judgment and thought content normal.   Diabetic Foot Exam: Diabetic Foot Exam - Simple   Simple Foot Form Visual Inspection No deformities, no ulcerations, no other skin breakdown bilaterally:  Yes Sensation Testing See comments:  Yes Pulse Check Posterior Tibialis and Dorsalis pulse intact bilaterally:  Yes Comments Decreased sensation to monofilament testing bilaterally  PHQ2/9: Depression screen [facility] 2/9 12/18/2017 12/03/2017 11/13/2017 09/03/2016 05/27/2016  Decreased Interest 0 1  1 0 1  Down, Depressed, Hopeless _0 0 0  PHQ - 2 Score _1 0 1  Altered sleeping 0 - 0 - -  Tired, decreased energy 0 - 1 - -  Change in appetite 0 - 0 - -  Feeling bad or failure about yourself  0 - 1 - -  Trouble concentrating 0 - 0 - -  Moving slowly or fidgety/restless 0 - 0 - -  Suicidal thoughts 0 - 0 - -  PHQ-9 Score 1 - 4 - -  Difficult doing work/chores - - Not difficult at all - -     Assessment & Plan RTC in 6 months for F/U: DM maintenance-check A1c CBC, CMP WNL done 05/05/18 at oncology appoitment  -Reviewed Health Maintenance:  - Hemoglobin A1c; Future - HM DIABETES FOOT EXAM; Future

## 2018-06-17 NOTE — Patient Instructions (Signed)
Please head downstairs for lab work. If any of your test results are critically abnormal, you will be contacted right away. Otherwise, I will contact you within a week about your test results and any recommendations for abnormalities.  I have placed a referral to ophthalmology for diabetic eye exam. Our office will call you to schedule this appointment. You should hear from our office in 7-10 days.   Please return in about 6 months for routine follow up.

## 2018-06-19 ENCOUNTER — Other Ambulatory Visit: Payer: Self-pay | Admitting: Nurse Practitioner

## 2018-06-19 DIAGNOSIS — E781 Pure hyperglyceridemia: Secondary | ICD-10-CM

## 2018-06-19 DIAGNOSIS — E785 Hyperlipidemia, unspecified: Secondary | ICD-10-CM

## 2018-06-19 MED ORDER — ATORVASTATIN CALCIUM 10 MG PO TABS: 10.0000 mg | ORAL_TABLET | Freq: Every day | ORAL | 2 refills | Status: DC

## 2018-07-27 ENCOUNTER — Ambulatory Visit
Admission: RE | Admit: 2018-07-27 | Discharge: 2018-07-27 | Disposition: A | Discharge status: Home / Self Care | Payer: Medicare Other | Source: Ambulatory Visit | Attending: Oncology | Admitting: Oncology

## 2018-07-27 ENCOUNTER — Telehealth: Payer: Self-pay

## 2018-07-27 ENCOUNTER — Other Ambulatory Visit (INDEPENDENT_AMBULATORY_CARE_PROVIDER_SITE_OTHER): Payer: Medicare Other

## 2018-07-27 DIAGNOSIS — F419 Anxiety disorder, unspecified: Secondary | ICD-10-CM

## 2018-07-27 DIAGNOSIS — C50212 Malignant neoplasm of upper-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 breast: Secondary | ICD-10-CM

## 2018-07-27 DIAGNOSIS — Z780 Asymptomatic menopausal state: Secondary | ICD-10-CM | POA: Diagnosis not present

## 2018-07-27 DIAGNOSIS — E785 Hyperlipidemia, unspecified: Secondary | ICD-10-CM

## 2018-07-27 DIAGNOSIS — Z170 Estrogen receptor positive status [ER+]: Principal | ICD-10-CM

## 2018-07-27 DIAGNOSIS — M8589 Other specified disorders of bone density and structure, multiple sites: Secondary | ICD-10-CM | POA: Diagnosis not present

## 2018-07-27 DIAGNOSIS — F321 Major depressive disorder, single episode, moderate: Secondary | ICD-10-CM

## 2018-07-27 LAB — LIPID PANEL
CHOL/HDL RATIO: 3
Cholesterol: 112 mg/dL (ref 0–200)
HDL: 37.2 mg/dL — ABNORMAL LOW (ref >39.00)
LDL CALC: 46 mg/dL (ref 0–99)
NONHDL: 74.43
Triglycerides: 144 mg/dL (ref 0.0–149.0)
VLDL: 28.8 mg/dL (ref 0.0–40.0)

## 2018-07-27 MED ORDER — TELMISARTAN-HCTZ 40-12.5 MG PO TABS: 1.0000 | ORAL_TABLET | Freq: Every day | ORAL | 1 refills | Status: DC

## 2018-07-27 NOTE — Telephone Encounter (Signed)
I sent in Micardis HCT 40/12. 5 mg as alternative to Losartan HCT 50/12.5; would recommend nurse visit in about 2 weeks for blood pressure check.

## 2018-07-27 NOTE — Telephone Encounter (Signed)
Patient came in office asking for an alternative medication for losartan to be sent in to [facility] on [address]. States that [facility] never has this medication. Please advise.

## 2018-07-28 MED ORDER — ATORVASTATIN CALCIUM 10 MG PO TABS: 10.0000 mg | ORAL_TABLET | Freq: Every day | ORAL | 1 refills | Status: DC

## 2018-07-28 NOTE — Telephone Encounter (Signed)
Pt aware. Nurse visit has been set up for BP check in 2 weeks.

## 2018-08-11 ENCOUNTER — Telehealth: Payer: Self-pay | Admitting: *Deleted

## 2018-08-11 ENCOUNTER — Ambulatory Visit: Payer: Medicare Other | Admitting: *Deleted

## 2018-08-11 VITALS — BP 130/88 | HR 82

## 2018-08-11 DIAGNOSIS — I10 Essential (primary) hypertension: Secondary | ICD-10-CM

## 2018-08-11 MED ORDER — HYDROCHLOROTHIAZIDE 25 MG PO TABS: 25.0000 mg | ORAL_TABLET | Freq: Every day | ORAL | 1 refills | Status: DC

## 2018-08-11 MED ORDER — TELMISARTAN 40 MG PO TABS: 40.0000 mg | ORAL_TABLET | Freq: Every day | ORAL | 1 refills | Status: DC

## 2018-08-11 NOTE — Telephone Encounter (Signed)
Pt informed of below. See meds.   Marrian Salvage, FNP  Cresenciano Lick, [name]        Can you let her know they don't make Micardis 40/25- had to send in 2 separate prescriptions for her. Thanks-

## 2018-08-11 NOTE — Progress Notes (Addendum)
Patient here for nurse visit BP check. See vitals.   Please let her know they don't make Micardis HCT 40/25- will have to separate into 2 different prescriptions for her.

## 2018-08-11 NOTE — Addendum Note (Signed)
Addended by: Sherlene Shams on: 08/11/2018 10:40 AM   Modules accepted: Orders

## 2018-09-24 DIAGNOSIS — H04123 Dry eye syndrome of bilateral lacrimal glands: Secondary | ICD-10-CM | POA: Diagnosis not present

## 2018-09-24 DIAGNOSIS — H524 Presbyopia: Secondary | ICD-10-CM | POA: Diagnosis not present

## 2018-09-24 DIAGNOSIS — E119 Type 2 diabetes mellitus without complications: Secondary | ICD-10-CM | POA: Diagnosis not present

## 2018-09-24 LAB — HM DIABETES EYE EXAM

## 2018-09-25 ENCOUNTER — Encounter: Payer: Self-pay | Admitting: Nurse Practitioner

## 2018-09-29 ENCOUNTER — Encounter: Payer: Self-pay | Admitting: Nurse Practitioner

## 2018-10-09 ENCOUNTER — Other Ambulatory Visit: Payer: Self-pay | Admitting: Family

## 2018-10-26 ENCOUNTER — Other Ambulatory Visit: Payer: Self-pay | Admitting: *Deleted

## 2018-10-26 MED ORDER — PANTOPRAZOLE SODIUM 40 MG PO TBEC: DELAYED_RELEASE_TABLET | ORAL | 2 refills | Status: DC

## 2018-11-02 ENCOUNTER — Other Ambulatory Visit: Payer: Self-pay

## 2018-11-02 DIAGNOSIS — C50011 Malignant neoplasm of nipple and areola, right female breast: Secondary | ICD-10-CM

## 2018-11-02 DIAGNOSIS — C50012 Malignant neoplasm of nipple and areola, left female breast: Principal | ICD-10-CM

## 2018-11-03 ENCOUNTER — Encounter: Payer: Self-pay | Admitting: Adult Health

## 2018-11-03 ENCOUNTER — Inpatient Hospital Stay: Payer: Medicare Other

## 2018-11-03 ENCOUNTER — Inpatient Hospital Stay: Payer: Medicare Other | Attending: Oncology

## 2018-11-03 ENCOUNTER — Inpatient Hospital Stay (HOSPITAL_BASED_OUTPATIENT_CLINIC_OR_DEPARTMENT_OTHER): Payer: Medicare Other | Admitting: Adult Health

## 2018-11-03 ENCOUNTER — Telehealth: Payer: Self-pay | Admitting: Adult Health

## 2018-11-03 VITALS — BP 113/65 | HR 94 | Temp 97.7°F | Resp 18 | Ht 60.0 in | Wt 149.3 lb

## 2018-11-03 DIAGNOSIS — M810 Age-related osteoporosis without current pathological fracture: Secondary | ICD-10-CM

## 2018-11-03 DIAGNOSIS — R1011 Right upper quadrant pain: Secondary | ICD-10-CM

## 2018-11-03 DIAGNOSIS — C50411 Malignant neoplasm of upper-outer quadrant of right female breast: Secondary | ICD-10-CM

## 2018-11-03 DIAGNOSIS — Z170 Estrogen receptor positive status [ER+]: Secondary | ICD-10-CM

## 2018-11-03 DIAGNOSIS — N898 Other specified noninflammatory disorders of vagina: Secondary | ICD-10-CM

## 2018-11-03 DIAGNOSIS — C50212 Malignant neoplasm of upper-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 breast: Secondary | ICD-10-CM | POA: Insufficient documentation

## 2018-11-03 DIAGNOSIS — C773 Secondary and unspecified malignant neoplasm of axilla and upper limb lymph nodes: Secondary | ICD-10-CM | POA: Diagnosis not present

## 2018-11-03 DIAGNOSIS — Z87891 Personal history of nicotine dependence: Secondary | ICD-10-CM | POA: Diagnosis not present

## 2018-11-03 DIAGNOSIS — R109 Unspecified abdominal pain: Secondary | ICD-10-CM

## 2018-11-03 DIAGNOSIS — C50011 Malignant neoplasm of nipple and areola, right female breast: Secondary | ICD-10-CM

## 2018-11-03 DIAGNOSIS — C50012 Malignant neoplasm of nipple and areola, left female breast: Secondary | ICD-10-CM

## 2018-11-03 LAB — CMP (CANCER CENTER ONLY)
ALBUMIN: 4.1 g/dL (ref 3.5–5.0)
ALT: 18 U/L (ref 0–44)
ANION GAP: 11 (ref 5–15)
AST: 13 U/L — AB (ref 15–41)
Alkaline Phosphatase: 80 U/L (ref 38–126)
BILIRUBIN TOTAL: 0.5 mg/dL (ref 0.3–1.2)
BUN: 24 mg/dL — AB (ref 8–23)
CO2: 26 mmol/L (ref 22–32)
Calcium: 10.2 mg/dL (ref 8.9–10.3)
Chloride: 107 mmol/L (ref 98–111)
Creatinine: 1.01 mg/dL — ABNORMAL HIGH (ref 0.44–1.00)
GFR, Est AFR Am: >60 mL/min (ref >60)
GFR, Estimated: 54 mL/min — ABNORMAL LOW (ref >60)
GLUCOSE: 139 mg/dL — AB (ref 70–99)
Potassium: 4.3 mmol/L (ref 3.5–5.1)
Sodium: 144 mmol/L (ref 135–145)
TOTAL PROTEIN: 7.7 g/dL (ref 6.5–8.1)

## 2018-11-03 LAB — CBC WITH DIFFERENTIAL (CANCER CENTER ONLY)
Abs Immature Granulocytes: 0.02 10*3/uL (ref 0.00–0.07)
BASOS PCT: 1 %
Basophils Absolute: 0 10*3/uL (ref 0.0–0.1)
EOS ABS: 0.1 10*3/uL (ref 0.0–0.5)
EOS PCT: 1 %
HCT: 41.4 % (ref 36.0–46.0)
Hemoglobin: 13.1 g/dL (ref 12.0–15.0)
Immature Granulocytes: 0 %
Lymphocytes Relative: 11 %
Lymphs Abs: 1 10*3/uL (ref 0.7–4.0)
MCH: 27.9 pg (ref 26.0–34.0)
MCHC: 31.6 g/dL (ref 30.0–36.0)
MCV: 88.1 fL (ref 80.0–100.0)
MONO ABS: 0.5 10*3/uL (ref 0.1–1.0)
MONOS PCT: 6 %
Neutro Abs: 7.1 10*3/uL (ref 1.7–7.7)
Neutrophils Relative %: 81 %
PLATELETS: 316 10*3/uL (ref 150–400)
RBC: 4.7 MIL/uL (ref 3.87–5.11)
RDW: 14.1 % (ref 11.5–15.5)
WBC Count: 8.7 10*3/uL (ref 4.0–10.5)
nRBC: 0 % (ref 0.0–0.2)

## 2018-11-03 MED ORDER — DENOSUMAB 60 MG/ML ~~LOC~~ SOSY
60.0000 mg | PREFILLED_SYRINGE | Freq: Once | SUBCUTANEOUS | Status: AC
  Administered 2018-11-03: 60 mg via SUBCUTANEOUS

## 2018-11-03 NOTE — Progress Notes (Signed)
[place]  [phone] [phone]     ID: Kristen Howe DOB: 09-17-1946  MR#: [id number]  MHD#:[id number]  Patient Care Team: Lance Sell, NP as PCP - General (Nurse Practitioner) Magrinat, Virgie Dad, MD as Consulting Physician (Oncology) Kyung Rudd, MD as Consulting Physician (Radiation Oncology) Stark Klein, MD as Consulting Physician (General Surgery) PCP: Lance Sell, NP GYN: OTHER MD:  CHIEF COMPLAINT: bilateral breast cancer  CURRENT TREATMENT: Anastrozole, denosumab/Prolia   BREAST CANCER HISTORY: From the original intake note:  "Kristen Howe" herself palpated a mass in her right breast sometime in November 2016. She brought it to Dr. Purcell Nails attention on 11/20/2015 and he set her up for bilateral diagnostic mammography with tomography and I lateral breast [facility]. The breast density was category B. In the right breast upper outer quadrant there where 2 nodules, at the 9:00 position (which by ultrasound was spiculated and measured 1.7 cm) and at the 11:00 position (which by ultrasound measured 0.7 cm. The right axilla showed a grossly abnormal lymph node measuring 1.4 cm with complete effacement of the hilum.there were several other small lymph nodes with cortical thickening in the lower right axilla as well.  Biopsy of the right breast mass on 12/12/2015 showed ([id number]) and invasive ductal carcinoma, grade 2, estrogen receptor 100% positive, progesterone receptor 80% positive, both with strong staining intensity, with an MIB-1 of 20%, and HER-2 nonamplified, with a signals ratio of 1.33 and number per cell 3.25.  In the left breast mammographically there was a multifocal mass centered at the 12:00 location with suspicious calcifications. The processes measured 6.7 cm altogether. A dilated duct extended to the nipple from this area, containing microcalcifications. Ultrasonography of  the left breast found a multifocal fragmented mass superiorly, the main component being hypoechoic and irregular and tolerable than wide, measuring 2.1 cm.superior to that, there was a second mass measuring 1.9 cm and there were other suspicious solid nodules in the adjacent parenchyma. There was also a separate area of microcalcifications containing a suspicious nodule at the 1:00 position. This measured 0.8 cm. The ultrasound confirmed a dilated duct extending from the superior process to the nipple. An alteration of the left axilla showed a grossly abnormal lymph node measuring up to 1 cm and a second indeterminant lymph node.  Biopsy of the left breast 11:30 o'clock massshowed invasive ductal carcinoma, grade 2, estrogen receptor 100% positive, progesterone receptor 70% positive, with an MIB-1 of 20%, and no HER-2 amplification, the signals ratio being 1.59 and the number per cell 3.90. A second area in the breast described as retroareolar showed ductal carcinoma, possibly in situ. This was also estrogen receptor positive at 100%, progesterone receptor positive at 60%, with an MIB-1 of 5%, and HER-2 negative, with a signals ratio of 1.73 and number per cell 3.55. Biopsy of the left breast lymph node was positiveand the prognostic panel there was very similar to all the others, namely estrogen receptor 100% positive, progesterone receptor 40% positive, with an MIB-1 of 40%, and HER-2 no amplification, with a signals ratio of 1.52 and number per cell 3.85.  The patient's subsequent history is as detailed below.  INTERVAL HISTORY: Kristen Howe returns today for follow-up of her estrogen receptor positive breast cancer accompanied by her sister. She continues on anastrozole, with good tolerance. She denies issues with hot flashes. There has been no change in her issue with vaginal dryness.  She also receives denosumab/ Prolia every 6  months, with a dose due today. She tolerates this well with no complications. She  has top dentures and does not regularly see the dentist due to cost issues.  She denies any dental concerns today.  Since her last visit she underwent repeat bone density on 07/27/2018 showing osteopenia with a t score of -2.2 in the left hip (no change) and the spine was not measured.     REVIEW OF SYSTEMS: Kristen Howe is doing well today.  She says this is intermittent and noted mostly after meals.  She denies any nausea association, fever or chills.  She denies any other new pains.  She is without unusual headaches or vision changes.  She denies bowel/bladder issues, decreased appetite or unintentional weight loss.  She is without chest pain, palpitations, cough, or shortness of breath.  A detailed ROS was otherwise non contributory.    PAST MEDICAL HISTORY: Past Medical History:  Diagnosis Date  . Allergy   . Anemia    as a child  . Anxiety   . Arthritis    "lower back" (01/25/2016)  . Bilateral breast cancer ([place])    [facility] Endo 01/25/2016  . Cataract   . Depression   . Esophageal diverticulum   . GERD (gastroesophageal reflux disease)   . Hyperlipidemia   . Hypertension   . Leg cramps    takes Flexeril  . Migraine    "maybe monthly" (01/25/2016)  . Osteoporosis   . Pneumonia ~ 2005 X 1  . Primary cancer of 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 breast ([place]) 12/14/2015  . Rheumatic fever 1950s    PAST SURGICAL HISTORY: Past Surgical History:  Procedure Laterality Date  . BREAST BIOPSY Bilateral 2016  . CATARACT EXTRACTION W/ INTRAOCULAR LENS  IMPLANT, BILATERAL Bilateral 05/2015-06/2015  . CESAREAN SECTION  1974; 1976  . COLONOSCOPY    . ESOPHAGEAL MANOMETRY N/A 03/10/2017   Procedure: ESOPHAGEAL MANOMETRY (EM);  Surgeon: Ladene Artist, MD;  Location: [facility];  Service: Endoscopy;  Laterality: N/A;  . EYE MUSCLE SURGERY Bilateral 1950s   for cross eyes as a child  . INSERTION OF MESH N/A 09/12/2015   Procedure: INSERTION OF MESH;  Surgeon: Erroll Luna, MD;  Location: [facility];   Service: General;  Laterality: N/A;  . MASTECTOMY COMPLETE / SIMPLE W/ SENTINEL NODE BIOPSY Right 01/25/2016  . MASTECTOMY MODIFIED RADICAL Left 01/25/2016  . MASTECTOMY W/ SENTINEL NODE BIOPSY Bilateral 01/25/2016   Procedure: RIGHT MASTECTOMY WITH SENTINEL RIGHT LYMPH NODE BIOPSY, LEFT MODIFIED RADICAL MASTECTOMY;  Surgeon: Stark Klein, MD;  Location: [facility];  Service: General;  Laterality: Bilateral;  . TONSILLECTOMY  1950s  . UMBILICAL HERNIA REPAIR N/A 09/12/2015   Procedure: REPAIR OF INCISIONAL AND UMBILICAL HERNIA;  Surgeon: Erroll Luna, MD;  Location: [facility];  Service: General;  Laterality: N/A;    FAMILY HISTORY Family History  Problem Relation Age of Onset  . Healthy Mother   . Other Mother        +"throat polyps"; +smoker  . Lymphoma Father        d. 32  . Diabetes Father   . Heart Problems Father   . Other Father        "tumors on his kidneys"  . Breast cancer Sister        dx. metachronous bilateral breast cancers at 3 and 74; - BRCA1/2 testing  . Cancer Maternal Grandmother 74       dx. spinal cancer  . Stroke Maternal Grandfather   . Gout  Paternal Grandmother   . Gout Paternal Grandfather   . Lung cancer Maternal Uncle        d. 70-72; +smoker  . Diabetes Paternal Aunt   . Diabetes Paternal Uncle   . Breast cancer Cousin        maternal 1st cousin dx. unspecified age  . Lung cancer Cousin        maternal 1st cousin dx. lung cancer; +smoker  . Lupus Cousin   . Breast cancer Cousin        paternal 1st cousin dx. 23s  . Breast cancer Cousin        paternal 1st cousin dx. 97s-30s  . Cancer Cousin        paternal 1st cousin dx. oral cancer with metastasis, also "lost a leg to cancer"; dx. 45s; +EtOH abuse  . Breast cancer Cousin        paternal 1st cousin, once-removed dx. at unspecified age  . Colon cancer Neg Hx   . Esophageal cancer Neg Hx   . Rectal cancer Neg Hx   . Stomach cancer Neg Hx   the patient's father died at the age of 28 from complications of  diabetes. He had a history of lymphoma. The patient's mother is currently living at age 55. The patient's brother died in an automobile accident. The patient has a twin sister,  -Animator. She has a history of bilateral breast cancers and did undergo genetic testing in November 2012, with no BRCA1 or 2 mutation found  GYNECOLOGIC HISTORY:  No LMP recorded. Patient is postmenopausal. Menarche age 32, first live birth age 35. The patient is GX P2. She stopped having periods approximately 1997. She did not take hormone replacement. She took oral contraceptives remotely for approximately 3 years, with no complications.  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 She is divorced and lives at home with her sister Joycelyn Schmid, with their mother, and with 2 nephews, Rush Landmark, who is not employed, and Corene Cornea, who is disabled. The patient has 3 grandchildren. She is not a church attender    ADVANCED DIRECTIVES: Not in place. At the initial clinic visit  the patient was given the appropriate forms to complete and notarize at her discretion.  HEALTH MAINTENANCE: Social History   Tobacco Use  . Smoking status: Former Smoker    Packs/day: 1.00    Years: 0.00    Pack years: 0.00    Types: Cigarettes    Last attempt to quit: 12/24/2015    Years since quitting: 2.8  . Smokeless tobacco: Never Used  Substance Use Topics  . Alcohol use: No    Alcohol/week: 0.0 standard drinks    Comment: 07/31/16 pt states she dosn't drink anymore,04/16/2016 "1/2 bottle wine per month" previously - not much now  . Drug use: No     Colonoscopy: 09/06/2016  PAP: 2010  Bone density: 07/26/2016 showed a T score of -2.5 osteoporosis   Lipid panel:  No Known Allergies  Current Outpatient Medications  Medication Sig Dispense Refill  . anastrozole (ARIMIDEX) 1 MG tablet Take 1 tablet (1 mg total) by mouth daily. 90 tablet 4  . Artificial Tear Ointment (DRY EYES OP)  Apply 1 drop to eye daily as needed (for dry eyes).     Marland Kitchen atorvastatin (LIPITOR) 10 MG tablet Take 1 tablet (10 mg total) by mouth daily. 90 tablet 1  . Calcium-Phosphorus-Vitamin D (CITRACAL +D3  PO) Take 2 tablets by mouth 2 (two) times daily.     . cyclobenzaprine (FLEXERIL) 10 MG tablet Take 1 tablet (10 mg total) by mouth 3 (three) times daily as needed. for muscle spams 30 tablet 3  . glucose blood test strip Use as instructed 100 each 12  . hydrochlorothiazide (HYDRODIURIL) 25 MG tablet TAKE 1 TABLET BY MOUTH ONCE DAILY 30 tablet 1  . ibuprofen (ADVIL,MOTRIN) 200 MG tablet Take 400 mg by mouth 2 (two) times daily as needed for moderate pain.    . metFORMIN (GLUCOPHAGE) 500 MG tablet Take 1 tablet (500 mg total) by mouth 2 (two) times daily with a meal. 180 tablet 1  . Multiple Vitamins-Minerals (MULTIVITAMIN WITH MINERALS) tablet Take 1 tablet by mouth daily. Reported on 12/20/2015    . pantoprazole (PROTONIX) 40 MG tablet TAKE 1 TABLET BY MOUTH TWICE DAILY BEFORE MEAL(S) 60 tablet 2  . telmisartan (MICARDIS) 40 MG tablet TAKE 1 TABLET BY MOUTH ONCE DAILY 30 tablet 1  . venlafaxine XR (EFFEXOR-XR) 75 MG 24 hr capsule Take 1 capsule (75 mg total) by mouth daily with breakfast. 90 capsule 1   No current facility-administered medications for this visit.     OBJECTIVE:  Vitals:   11/03/18 1121  BP: 113/65  Pulse: 94  Resp: 18  Temp: 97.7 F (36.5 C)  SpO2: 94%     Body mass index is 29.16 kg/m.    ECOG FS:0 - Asymptomatic  GENERAL: Patient is a well appearing female in no acute distress HEENT:  Sclerae anicteric.  Oropharynx clear and moist. No ulcerations or evidence of oropharyngeal candidiasis. Neck is supple.  NODES:  No cervical, supraclavicular, or axillary lymphadenopathy palpated.  BREAST EXAM:  S/p bilateral mastectomies, no sign of local recurrence LUNGS:  Clear to auscultation bilaterally.  No wheezes or rhonchi. HEART:  Regular rate and rhythm. No murmur  appreciated. ABDOMEN:  Soft, nontender.  Positive, normoactive bowel sounds. No organomegaly palpated. MSK:  No focal spinal tenderness to palpation. Full range of motion bilaterally in the upper extremities. EXTREMITIES:  No peripheral edema.   SKIN:  Clear with no obvious rashes or skin changes. No nail dyscrasia. NEURO:  Nonfocal. Well oriented.  Appropriate affect.    LAB RESULTS:  CMP     Component Value Date/Time   NA 141 05/05/2018 1244   NA 141 11/04/2017 0952   K 4.4 05/05/2018 1244   K 3.9 11/04/2017 0952   CL 107 05/05/2018 1244   CO2 25 05/05/2018 1244   CO2 26 11/04/2017 0952   GLUCOSE 85 05/05/2018 1244   GLUCOSE 72 11/04/2017 0952   BUN 19 05/05/2018 1244   BUN 20.5 11/04/2017 0952   CREATININE 0.84 05/05/2018 1244   CREATININE 0.9 11/04/2017 0952   CALCIUM 10.1 05/05/2018 1244   CALCIUM 10.7 (H) 11/04/2017 0952   PROT 7.4 05/05/2018 1244   PROT 8.0 11/04/2017 0952   ALBUMIN 4.0 05/05/2018 1244   ALBUMIN 3.9 11/04/2017 0952   AST 16 05/05/2018 1244   AST 29 11/04/2017 0952   ALT 21 05/05/2018 1244   ALT 37 11/04/2017 0952   ALKPHOS 67 05/05/2018 1244   ALKPHOS 81 11/04/2017 0952   BILITOT 0.3 05/05/2018 1244   BILITOT 0.34 11/04/2017 0952   GFRNONAA >60 05/05/2018 1244   GFRAA >60 05/05/2018 1244    INo results found for: SPEP, UPEP  Lab Results  Component Value Date   WBC 8.7 11/03/2018   NEUTROABS 7.1 11/03/2018   HGB  13.1 11/03/2018   HCT 41.4 11/03/2018   MCV 88.1 11/03/2018   PLT 316 11/03/2018      Chemistry      Component Value Date/Time   NA 141 05/05/2018 1244   NA 141 11/04/2017 0952   K 4.4 05/05/2018 1244   K 3.9 11/04/2017 0952   CL 107 05/05/2018 1244   CO2 25 05/05/2018 1244   CO2 26 11/04/2017 0952   BUN 19 05/05/2018 1244   BUN 20.5 11/04/2017 0952   CREATININE 0.84 05/05/2018 1244   CREATININE 0.9 11/04/2017 0952      Component Value Date/Time   CALCIUM 10.1 05/05/2018 1244   CALCIUM 10.7 (H) 11/04/2017 0952    ALKPHOS 67 05/05/2018 1244   ALKPHOS 81 11/04/2017 0952   AST 16 05/05/2018 1244   AST 29 11/04/2017 0952   ALT 21 05/05/2018 1244   ALT 37 11/04/2017 0952   BILITOT 0.3 05/05/2018 1244   BILITOT 0.34 11/04/2017 0952       No results found for: LABCA2  No components found for: LABCA125  No results for input(s): INR in the last 168 hours.  Urinalysis    Component Value Date/Time   COLORURINE YELLOW 01/17/2016 0910   APPEARANCEUR CLEAR 01/17/2016 0910   LABSPEC 1.022 01/17/2016 0910   PHURINE 6.0 01/17/2016 0910   GLUCOSEU NEGATIVE 01/17/2016 0910   HGBUR NEGATIVE 01/17/2016 0910   BILIRUBINUR NEGATIVE 01/17/2016 0910   KETONESUR NEGATIVE 01/17/2016 0910   PROTEINUR NEGATIVE 01/17/2016 0910   NITRITE NEGATIVE 01/17/2016 0910   LEUKOCYTESUR TRACE (A) 01/17/2016 0910    STUDIES: No results found.   ASSESSMENT: 72 y.o. BRCA negative [place] woman  (1) status post bilateral breast biopsies and left axillary lymph node biopsy 12/12/2015, showing  (a) on the Right upper-outer quadrant, a clinical mT1c N1, stage IIA, estrogen and progesterone receptor positive, HER-2 not amplified, with an MIB-1 of 20%  (b) on the left upper inner quadrant a clinical mT2 pN1, stage IIB invasive ductal carcinoma, grade 2 over 3, estrogen and progesterone receptor positive, HER-2 not amplified, with MIB-1 between 5 and 40%  (c) chest CT and bone scan 01/04/2016 showed no evidence of metastatic disease. They did suggest a large esophageal diverticulum  (2) Status post right mastectomy with sentinel lymph node sampling and left modified radical mastectomy 01/25/2016  (a) right side: pT2, pN1, stage IIB invasive ductal carcinoma, grade 2, with negative margins  (b) left side: pT3 pN1, stage IIIA invasive ductal carcinoma, grade 2, with negative margins  (c) both cancers were estrogen and progesterone receptor positive, HER-2 nonamplified, with MIB-1 between 5 and 20%   (3) Mammaprint on both  breast cancers came back "low risk" predicting a 5 year distant metastasis free survival in the 96% range   (4) no chemotherapy and no right-sided axillary lymph node dissection recommended at conference 02/21/2016  (5) adjuvant radiation completed 05/06/2016   (6) anastrozole started 06/18/2016  (a) bone density 07/26/2016 shows osteoporosis with a T score of -2.5.  (b)  denosumab/Prolia started 09/04/2016  (7) genetics testing 04/26/2016 through the [facility] through [employer], MD) found no deleterious mutations in ATM, BARD1, BRCA1, BRCA2, BRIP1, CDH1, CHEK2, FANCC, MLH1, MSH2, MSH6, NBN, PALB2, PMS2, PTEN, RAD51C, RAD51D, TP53, and XRCC2. This panel also includes deletion/duplication analysis (without sequencing) for one gene, EPCAM.  PLAN:  Jaclynne is doing well today. Her labs are stable, other than a mild increase in her kidney function for which I recommended  increased fluid intake (she agrees).  She will continue anastrozole and prolia.    Her abdominal issues are interesting.  I am not sure what is the cause.  I have ordered a RUQ ultrasound to evaluate the liver and gall bladder.  This should be done within the next week.    We discussed her lack of dental care.  We reviewed the risk of osteonecrosis of the jaw with the Prolia and she knows to let us know if she develops any dental issues.  I reviewed new or persistent symptoms that she should let us know about.  She understands this.  Kristen Howe and I talked about healthy diet, exercise today.  She will return in 6 months for f/u with Dr. Jana Hakim.  She knows to call for any other issues that may develop before the next visit  A total of (30) minutes of face-to-face time was spent with this patient with greater than 50% of that time in counseling and care-coordination.   Wilber Bihari, NP  11/03/18 11:47 AM Medical Oncology and Hematology [facility] [place],  [address] Tel. [phone]    Fax. [phone]

## 2018-11-03 NOTE — Patient Instructions (Signed)

## 2018-11-03 NOTE — Telephone Encounter (Signed)
Per 11/19 no los 

## 2018-11-04 ENCOUNTER — Other Ambulatory Visit (INDEPENDENT_AMBULATORY_CARE_PROVIDER_SITE_OTHER): Payer: Medicare Other

## 2018-11-04 ENCOUNTER — Ambulatory Visit: Payer: Self-pay | Admitting: *Deleted

## 2018-11-04 ENCOUNTER — Ambulatory Visit (INDEPENDENT_AMBULATORY_CARE_PROVIDER_SITE_OTHER): Payer: Medicare Other | Admitting: Nurse Practitioner

## 2018-11-04 ENCOUNTER — Encounter: Payer: Self-pay | Admitting: Nurse Practitioner

## 2018-11-04 VITALS — BP 100/70 | HR 92 | Temp 99.2°F | Ht 60.0 in | Wt 148.0 lb

## 2018-11-04 DIAGNOSIS — R112 Nausea with vomiting, unspecified: Secondary | ICD-10-CM

## 2018-11-04 DIAGNOSIS — R197 Diarrhea, unspecified: Secondary | ICD-10-CM

## 2018-11-04 LAB — COMPREHENSIVE METABOLIC PANEL
ALK PHOS: 72 U/L (ref 39–117)
ALT: 14 U/L (ref 0–35)
AST: 12 U/L (ref 0–37)
Albumin: 4.5 g/dL (ref 3.5–5.2)
BUN: 23 mg/dL (ref 6–23)
CHLORIDE: 106 meq/L (ref 96–112)
CO2: 25 meq/L (ref 19–32)
Calcium: 9.2 mg/dL (ref 8.4–10.5)
Creatinine, Ser: 0.86 mg/dL (ref 0.40–1.20)
GFR: 68.91 mL/min (ref >60.00)
GLUCOSE: 115 mg/dL — AB (ref 70–99)
POTASSIUM: 3.9 meq/L (ref 3.5–5.1)
SODIUM: 140 meq/L (ref 135–145)
TOTAL PROTEIN: 7.7 g/dL (ref 6.0–8.3)
Total Bilirubin: 0.6 mg/dL (ref 0.2–1.2)

## 2018-11-04 LAB — CBC
HCT: 40.5 % (ref 36.0–46.0)
HEMOGLOBIN: 13.3 g/dL (ref 12.0–15.0)
MCHC: 32.9 g/dL (ref 30.0–36.0)
MCV: 86.3 fl (ref 78.0–100.0)
PLATELETS: 308 10*3/uL (ref 150.0–400.0)
RBC: 4.69 Mil/uL (ref 3.87–5.11)
RDW: 14.8 % (ref 11.5–15.5)
WBC: 10.8 10*3/uL — ABNORMAL HIGH (ref 4.0–10.5)

## 2018-11-04 LAB — LIPASE: LIPASE: 21 U/L (ref 11.0–59.0)

## 2018-11-04 MED ORDER — ONDANSETRON 4 MG PO TBDP: 4.0000 mg | ORAL_TABLET | Freq: Three times a day (TID) | ORAL | 0 refills | Status: DC | PRN

## 2018-11-04 NOTE — Patient Instructions (Addendum)
Vomiting, Adult Vomiting occurs when stomach contents are thrown up and out of the mouth. Many people notice nausea before vomiting. Vomiting can make you feel weak and dehydrated. Dehydration can make you tired and thirsty, cause you to have a dry mouth, and decrease how often you urinate. Older adults and people who have other diseases or a weak immune system are at higher risk for dehydration.It is important to treat vomiting as told by your health care provider. Follow these instructions at home: Follow your health care provider's instructions about how to care for yourself at home. Eating and drinking Follow these recommendations as told by your health care provider:  Take an oral rehydration solution (ORS). This is a drink that is sold at pharmacies and retail stores.  Eat bland, easy-to-digest foods in small amounts as you are able. These foods include bananas, applesauce, rice, lean meats, toast, and crackers.  Drink clear fluids in small amounts as you are able. Clear fluids include water, ice chips, low-calorie sports drinks, and fruit juice that has water added (diluted fruit juice).  Avoid fluids that contain a lot of sugar or caffeine.  Avoid alcohol and foods that are spicy or fatty.  General instructions   Wash your hands frequently with soap and water. If soap and water are not available, use hand sanitizer. Make sure that everyone in your household washes their hands frequently.  Take over-the-counter and prescription medicines only as told by your health care provider.  Watch your condition for any changes.  Keep all follow-up visits as told by your health care provider. This is important. Contact a health care provider if:  You have a fever.  You are not able to keep fluids down.  Your vomiting gets worse.  You have new symptoms.  You feel light-headed or dizzy.  You have a headache.  You have muscle cramps. Get help right away if:  You have pain in  your chest, neck, arm, or jaw.  You feel extremely weak or you faint.  You have persistent vomiting.  You have vomit that is bright red or looks like black coffee grounds.  You have stools that are bloody or black, or stools that look like tar.  You have severe pain, cramping, or bloating in your abdomen.  You have a severe headache, a stiff neck, or both.  You have a rash.  You have trouble breathing or you are breathing very quickly.  Your heart is beating very quickly.  Your skin feels cold and clammy.  You feel confused.  You have pain while urinating.  You have signs of dehydration, such as: ? Dark urine, or very little or no urine. ? Cracked lips. ? Dry mouth. ? Sunken eyes. ? Sleepiness. ? Weakness. These symptoms may represent a serious problem that is an emergency. Do not wait to see if the symptoms will go away. Get medical help right away. Call your local emergency services (911 in the [place] Do not drive yourself to the hospital. This information is not intended to replace advice given to you by your health care provider. Make sure you discuss any questions you have with your health care provider. Document Released: 12/29/2015 Document Revised: 05/09/2016 Document Reviewed: 08/08/2015 Elsevier Interactive Patient Education  2018 [place] Choices to Help Relieve Diarrhea, Adult When you have diarrhea, the foods you eat and your eating habits are very important. Choosing the right foods and drinks can help:  Relieve diarrhea.  Replace  lost fluids and nutrients.  Prevent dehydration.  What general guidelines should I follow? Relieving diarrhea  Choose foods with less than 2 g or .07 oz. of fiber per serving.  Limit fats to less than 8 tsp (38 g or 1.34 oz.) a day.  Avoid the following: ? Foods and beverages sweetened with high-fructose corn syrup, honey, or sugar alcohols such as xylitol, sorbitol, and mannitol. ? Foods that contain a  lot of fat or sugar. ? Fried, greasy, or spicy foods. ? High-fiber grains, breads, and cereals. ? Raw fruits and vegetables.  Eat foods that are rich in probiotics. These foods include dairy products such as yogurt and fermented milk products. They help increase healthy bacteria in the stomach and intestines (gastrointestinal tract, or GI tract).  If you have lactose intolerance, avoid dairy products. These may make your diarrhea worse.  Take medicine to help stop diarrhea (antidiarrheal medicine) only as told by your health care provider. Replacing nutrients  Eat small meals or snacks every 3-4 hours.  Eat bland foods, such as white rice, toast, or baked potato, until your diarrhea starts to get better. Gradually reintroduce nutrient-rich foods as tolerated or as told by your health care provider. This includes: ? Well-cooked protein foods. ? Peeled, seeded, and soft-cooked fruits and vegetables. ? Low-fat dairy products.  Take vitamin and mineral supplements as told by your health care provider. Preventing dehydration   Start by sipping water or a special solution to prevent dehydration (oral rehydration solution, ORS). Urine that is clear or pale yellow means that you are getting enough fluid.  Try to drink at least 8-10 cups of fluid each day to help replace lost fluids.  You may add other liquids in addition to water, such as clear juice or decaffeinated sports drinks, as tolerated or as told by your health care provider.  Avoid drinks with caffeine, such as coffee, tea, or soft drinks.  Avoid alcohol. What foods are recommended? The items listed may not be a complete list. Talk with your health care provider about what dietary choices are best for you. Grains White rice. White, [place], or pita breads (fresh or toasted), including plain rolls, buns, or bagels. White pasta. Saltine, soda, or graham crackers. Pretzels. Low-fiber cereal. Cooked cereals made with water (such as  cornmeal, farina, or cream cereals). Plain muffins. Matzo. Melba toast. Zwieback. Vegetables Potatoes (without the skin). Most well-cooked and canned vegetables without skins or seeds. Tender lettuce. Fruits Apple sauce. Fruits canned in juice. Cooked apricots, cherries, grapefruit, peaches, pears, or plums. Fresh bananas and cantaloupe. Meats and other protein foods Baked or boiled chicken. Eggs. Tofu. Fish. Seafood. Smooth nut butters. Ground or well-cooked tender beef, ham, veal, lamb, pork, or poultry. Dairy Plain yogurt, kefir, and unsweetened liquid yogurt. Lactose-free milk, buttermilk, skim milk, or soy milk. Low-fat or nonfat hard cheese. Beverages Water. Low-calorie sports drinks. Fruit juices without pulp. Strained tomato and vegetable juices. Decaffeinated teas. Sugar-free beverages not sweetened with sugar alcohols. Oral rehydration solutions, if approved by your health care provider. Seasoning and other foods Bouillon, broth, or soups made from recommended foods. What foods are not recommended? The items listed may not be a complete list. Talk with your health care provider about what dietary choices are best for you. Grains Whole grain, whole wheat, bran, or rye breads, rolls, pastas, and crackers. Wild or brown rice. Whole grain or bran cereals. Barley. Oats and oatmeal. Corn tortillas or taco shells. Granola. Popcorn. Vegetables Raw vegetables. Fried vegetables. Cabbage,  broccoli, Brussels sprouts, artichokes, baked beans, beet greens, corn, kale, legumes, peas, sweet potatoes, and yams. Potato skins. Cooked spinach and cabbage. Fruits Dried fruit, including raisins and dates. Raw fruits. Stewed or dried prunes. Canned fruits with syrup. Meat and other protein foods Fried or fatty meats. Deli meats. Chunky nut butters. Nuts and seeds. Beans and lentils. Berniece Salines. Hot dogs. Sausage. Dairy High-fat cheeses. Whole milk, chocolate milk, and beverages made with milk, such as milk  shakes. Half-and-half. Cream. sour cream. Ice cream. Beverages Caffeinated beverages (such as coffee, tea, soda, or energy drinks). Alcoholic beverages. Fruit juices with pulp. Prune juice. Soft drinks sweetened with high-fructose corn syrup or sugar alcohols. High-calorie sports drinks. Fats and oils Butter. Cream sauces. Margarine. Salad oils. Plain salad dressings. Olives. Avocados. Mayonnaise. Sweets and desserts Sweet rolls, doughnuts, and sweet breads. Sugar-free desserts sweetened with sugar alcohols such as xylitol and sorbitol. Seasoning and other foods Honey. Hot sauce. Chili powder. Gravy. Cream-based or milk-based soups. Pancakes and waffles. Summary  When you have diarrhea, the foods you eat and your eating habits are very important.  Make sure you get at least 8-10 cups of fluid each day, or enough to keep your urine clear or pale yellow.  Eat bland foods and gradually reintroduce healthy, nutrient-rich foods as tolerated, or as told by your health care provider.  Avoid high-fiber, fried, greasy, or spicy foods. This information is not intended to replace advice given to you by your health care provider. Make sure you discuss any questions you have with your health care provider. Document Released: 02/22/2004 Document Revised: 11/29/2016 Document Reviewed: 11/29/2016 Elsevier Interactive Patient Education  [employer].

## 2018-11-04 NOTE — Telephone Encounter (Signed)
Patient is calling to report she has had diarrhea for 3 days now- she started vomiting last night- states it is black in color.  Patient has been taking Imodium yesterday- 2 doses- did not help with the diarrhea. Appointment made for evaluation in office. Reason for Disposition . [1] SEVERE diarrhea (e.g., 7 or more times / day more than normal) AND [2] age > 60 years  Answer Assessment - Initial Assessment Questions 1. DIARRHEA SEVERITY: "How bad is the diarrhea?" "How many extra stools have you had in the past 24 hours than normal?"    - NO DIARRHEA (SCALE 0)   - MILD (SCALE 1-3): Few loose or mushy BMs; increase of 1-3 stools over normal daily number of stools; mild increase in ostomy output.   -  MODERATE (SCALE 4-7): Increase of 4-6 stools daily over normal; moderate increase in ostomy output. * SEVERE (SCALE 8-10; OR 'WORST POSSIBLE'): Increase of 7 or more stools daily over normal; moderate increase in ostomy output; incontinence.     Every 5 minutes last night- watery, 3 days prior- watery 2. ONSET: "When did the diarrhea begin?"      3 days ago 3. BM CONSISTENCY: "How loose or watery is the diarrhea?"      watery 4. VOMITING: "Are you also vomiting?" If so, ask: "How many times in the past 24 hours?"      Vomiting started this morning- 3 times- dark black stringy 5. ABDOMINAL PAIN: "Are you having any abdominal pain?" If yes: "What does it feel like?" (e.g., crampy, dull, intermittent, constant)      no 6. ABDOMINAL PAIN SEVERITY: If present, ask: "How bad is the pain?"  (e.g., Scale 1-10; mild, moderate, or severe)   - MILD (1-3): doesn't interfere with normal activities, abdomen soft and not tender to touch    - MODERATE (4-7): interferes with normal activities or awakens from sleep, tender to touch    - SEVERE (8-10): excruciating pain, doubled over, unable to do any normal activities       n/a 7. ORAL INTAKE: If vomiting, "Have you been able to drink liquids?" "How much fluids  have you had in the past 24 hours?"     Coffee today 8. HYDRATION: "Any signs of dehydration?" (e.g., dry mouth [not just dry lips], too weak to stand, dizziness, new weight loss) "When did you last urinate?"     Maybe loss of 1-2 lb 9. EXPOSURE: "Have you traveled to a foreign country recently?" "Have you been exposed to anyone with diarrhea?" "Could you have eaten any food that was spoiled?"     no 10. ANTIBIOTIC USE: "Are you taking antibiotics now or have you taken antibiotics in the past 2 months?"       no 11. OTHER SYMPTOMS: "Do you have any other symptoms?" (e.g., fever, blood in stool)       No fever- patient does not look at stool, funny taste in mouth 12. PREGNANCY: "Is there any chance you are pregnant?" "When was your last menstrual period?"       n/a  Protocols used: DIARRHEA-A-AH

## 2018-11-04 NOTE — Progress Notes (Signed)
Kristen Howe is a 72 y.o. female with the following history as recorded in Epic[employer]:  Patient Active Problem List   Diagnosis Date Noted  . Diabetes mellitus ([place]) 10/07/2017  . Patellofemoral pain syndrome of both knees 10/03/2017  . Anxiety 09/29/2017  . Current moderate episode of major depressive disorder ([place]) 09/29/2017  . Esophageal diverticulum 04/14/2017  . Esophageal motility disorder 04/14/2017  . Esophageal dysphagia   . Hypertriglyceridemia 09/03/2016  . Medicare annual wellness visit, subsequent 09/03/2016  . Overweight (BMI 25.0-29.9) 09/03/2016  . Osteoporosis 08/28/2016  . Bilateral breast cancer ([place]) 01/25/2016  . Tobacco abuse 12/22/2015  . Malignant neoplasm of upper-inner quadrant of left breast in female, estrogen receptor positive ([place]) 12/14/2015  . Lump in female breast 11/20/2015  . Umbilical hernia without obstruction and without gangrene 07/28/2015  . Essential hypertension 07/28/2015  . Seborrheic keratosis 07/28/2015    Current Outpatient Medications  Medication Sig Dispense Refill  . anastrozole (ARIMIDEX) 1 MG tablet Take 1 tablet (1 mg total) by mouth daily. 90 tablet 4  . Artificial Tear Ointment (DRY EYES OP) Apply 1 drop to eye daily as needed (for dry eyes).     Marland Kitchen atorvastatin (LIPITOR) 10 MG tablet Take 1 tablet (10 mg total) by mouth daily. 90 tablet 1  . Calcium-Phosphorus-Vitamin D (CITRACAL +D3 PO) Take 2 tablets by mouth 2 (two) times daily.     . cyclobenzaprine (FLEXERIL) 10 MG tablet Take 1 tablet (10 mg total) by mouth 3 (three) times daily as needed. for muscle spams 30 tablet 3  . glucose blood test strip Use as instructed 100 each 12  . hydrochlorothiazide (HYDRODIURIL) 25 MG tablet TAKE 1 TABLET BY MOUTH ONCE DAILY 30 tablet 1  . ibuprofen (ADVIL,MOTRIN) 200 MG tablet Take 400 mg by mouth 2 (two) times daily as needed for moderate pain.    . metFORMIN (GLUCOPHAGE) 500 MG tablet Take 1 tablet (500 mg total) by mouth 2 (two) times  daily with a meal. 180 tablet 1  . Multiple Vitamins-Minerals (MULTIVITAMIN WITH MINERALS) tablet Take 1 tablet by mouth daily. Reported on 12/20/2015    . pantoprazole (PROTONIX) 40 MG tablet TAKE 1 TABLET BY MOUTH TWICE DAILY BEFORE MEAL(S) 60 tablet 2  . telmisartan (MICARDIS) 40 MG tablet TAKE 1 TABLET BY MOUTH ONCE DAILY 30 tablet 1  . venlafaxine XR (EFFEXOR-XR) 75 MG 24 hr capsule Take 1 capsule (75 mg total) by mouth daily with breakfast. 90 capsule 1  . ondansetron (ZOFRAN ODT) 4 MG disintegrating tablet Take 1 tablet (4 mg total) by mouth every 8 (eight) hours as needed for nausea or vomiting. 20 tablet 0   No current facility-administered medications for this visit.     Allergies: Patient has no known allergies.  Past Medical History:  Diagnosis Date  . Allergy   . Anemia    as a child  . Anxiety   . Arthritis    "lower back" (01/25/2016)  . Bilateral breast cancer ([place])    Archie Endo 01/25/2016  . Cataract   . Depression   . Esophageal diverticulum   . GERD (gastroesophageal reflux disease)   . Hyperlipidemia   . Hypertension   . Leg cramps    takes Flexeril  . Migraine    "maybe monthly" (01/25/2016)  . Osteoporosis   . Pneumonia ~ 2005 X 1  . Primary cancer of 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 breast ([facility]) 12/14/2015  . Rheumatic fever 1950s    Past Surgical History:  Procedure  Laterality Date  . BREAST BIOPSY Bilateral 2016  . CATARACT EXTRACTION W/ INTRAOCULAR LENS  IMPLANT, BILATERAL Bilateral 05/2015-06/2015  . CESAREAN SECTION  1974; 1976  . COLONOSCOPY    . ESOPHAGEAL MANOMETRY N/A 03/10/2017   Procedure: ESOPHAGEAL MANOMETRY (EM);  Surgeon: Ladene Artist, MD;  Location: [facility];  Service: Endoscopy;  Laterality: N/A;  . EYE MUSCLE SURGERY Bilateral 1950s   for cross eyes as a child  . INSERTION OF MESH N/A 09/12/2015   Procedure: INSERTION OF MESH;  Surgeon: Erroll Luna, MD;  Location: [facility];  Service: General;  Laterality: N/A;  . MASTECTOMY COMPLETE /  SIMPLE W/ SENTINEL NODE BIOPSY Right 01/25/2016  . MASTECTOMY MODIFIED RADICAL Left 01/25/2016  . MASTECTOMY W/ SENTINEL NODE BIOPSY Bilateral 01/25/2016   Procedure: RIGHT MASTECTOMY WITH SENTINEL RIGHT LYMPH NODE BIOPSY, LEFT MODIFIED RADICAL MASTECTOMY;  Surgeon: Stark Klein, MD;  Location: [facility];  Service: General;  Laterality: Bilateral;  . TONSILLECTOMY  1950s  . UMBILICAL HERNIA REPAIR N/A 09/12/2015   Procedure: REPAIR OF INCISIONAL AND UMBILICAL HERNIA;  Surgeon: Erroll Luna, MD;  Location: [facility];  Service: General;  Laterality: N/A;    Family History  Problem Relation Age of Onset  . Healthy Mother   . Other Mother        +"throat polyps"; +smoker  . Lymphoma Father        d. 79  . Diabetes Father   . Heart Problems Father   . Other Father        "tumors on his kidneys"  . Breast cancer Sister        dx. metachronous bilateral breast cancers at 60 and 76; - BRCA1/2 testing  . Cancer Maternal Grandmother 54       dx. spinal cancer  . Stroke Maternal Grandfather   . Gout Paternal Grandmother   . Gout Paternal Grandfather   . Lung cancer Maternal Uncle        d. 70-72; +smoker  . Diabetes Paternal Aunt   . Diabetes Paternal Uncle   . Breast cancer Cousin        maternal 1st cousin dx. unspecified age  . Lung cancer Cousin        maternal 1st cousin dx. lung cancer; +smoker  . Lupus Cousin   . Breast cancer Cousin        paternal 1st cousin dx. 26s  . Breast cancer Cousin        paternal 1st cousin dx. 73s-30s  . Cancer Cousin        paternal 1st cousin dx. oral cancer with metastasis, also "lost a leg to cancer"; dx. 77s; +EtOH abuse  . Breast cancer Cousin        paternal 1st cousin, once-removed dx. at unspecified age  . Colon cancer Neg Hx   . Esophageal cancer Neg Hx   . Rectal cancer Neg Hx   . Stomach cancer Neg Hx     Social History   Tobacco Use  . Smoking status: Former Smoker    Packs/day: 1.00    Years: 0.00    Pack years: 0.00    Types:  Cigarettes    Last attempt to quit: 12/24/2015    Years since quitting: 2.8  . Smokeless tobacco: Never Used  Substance Use Topics  . Alcohol use: No    Alcohol/week: 0.0 standard drinks    Comment: 07/31/16 pt states she dosn't drink anymore,04/16/2016 "1/2 bottle wine per month" previously - not much now  Subjective:  Ms Link is here today for evaluation of n/v/d, first developed diarrhea about 3 days ago, seemed to start after she took a  muscle relaxer, diarrhea has persisted since, "going too many times to count" has not noticed any blood in stool. Has taken a muscle relaxer before without similar symptoms. She then became nauseate and vomited once this morning, noticed black color to her vomit so called for an appointment. States shes not been eating well over past 3 days, has not been able to take all of her medications, trying to stay hydrated but not sure she is drinking enough. She started immodium yesterday, temporarily relieves the diarrhea. She denies any recent sick contacts, no recent travel or antibiotics.  Review of Systems  Constitutional: Positive for malaise/fatigue. Negative for chills and fever.  Gastrointestinal: Positive for abdominal pain, diarrhea, nausea and vomiting. Negative for blood in stool.  Genitourinary: Negative for dysuria, frequency and hematuria.  Musculoskeletal: Positive for myalgias.  Skin: Negative for itching and rash.  Neurological: Positive for weakness. Negative for dizziness and loss of consciousness.   Objective:  Vitals:   11/04/18 1055  BP: 100/70  Pulse: 92  Temp: 99.2 F (37.3 C)  TempSrc: Oral  SpO2: 96%  Weight: 148 lb (67.1 kg)  Height: 5' (1.524 m)    General: Well developed, well nourished, in no acute distress  Skin : Warm and dry.  Head: Normocephalic and atraumatic  Eyes: Sclera and conjunctiva clear; pupils round and reactive to light; extraocular movements intact  Oropharynx: Pink, supple. No suspicious lesions   Neck: Supple  Lungs: Respirations unlabored; clear to auscultation bilaterally CVS exam: normal rate and regular rhythm, S1 and S2 normal.  Abdomen: Soft; nontender; normoactive bowel sounds; no masses or hepatosplenomegaly  Extremities: No edema, cyanosis, clubbing  Vessels: Symmetric bilaterally  Neurologic: Alert and oriented; speech intact; face symmetrical; moves all extremities well; CNII-XII intact without focal deficit  Psychiatric: Normal mood and affect.  Assessment:  1. Nausea and vomiting, intractability of vomiting not specified, unspecified vomiting type   2. Diarrhea, unspecified type     Plan:   Zofran Rx sent- dosing, side effects discussed Home management, red flags and strict return precautions including when to seek immediate care discussed and printed on AVS-she understands to go to ER for worsening symptoms or if unable to tolerate oral intake Labs today F/U with further recommendations pending lab results   No follow-ups on file.  Orders Placed This Encounter  Procedures  . CBC    Standing Status:   Future    Number of Occurrences:   1    Standing Expiration Date:   11/05/2019  . Comprehensive metabolic panel    Standing Status:   Future    Number of Occurrences:   1    Standing Expiration Date:   11/05/2019  . Lipase    Standing Status:   Future    Number of Occurrences:   1    Standing Expiration Date:   11/04/2019    Requested Prescriptions   Signed Prescriptions Disp Refills  . ondansetron (ZOFRAN ODT) 4 MG disintegrating tablet 20 tablet 0    Sig: Take 1 tablet (4 mg total) by mouth every 8 (eight) hours as needed for nausea or vomiting.

## 2018-11-08 ENCOUNTER — Other Ambulatory Visit: Payer: Self-pay | Admitting: Family

## 2018-11-10 ENCOUNTER — Ambulatory Visit (HOSPITAL_COMMUNITY)
Admission: RE | Admit: 2018-11-10 | Discharge: 2018-11-10 | Disposition: A | Discharge status: Home / Self Care | Payer: Medicare Other | Source: Ambulatory Visit | Attending: Adult Health | Admitting: Adult Health

## 2018-11-10 DIAGNOSIS — K760 Fatty (change of) liver, not elsewhere classified: Secondary | ICD-10-CM | POA: Insufficient documentation

## 2018-11-10 DIAGNOSIS — R1011 Right upper quadrant pain: Secondary | ICD-10-CM | POA: Diagnosis not present

## 2018-11-10 DIAGNOSIS — C50212 Malignant neoplasm of upper-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 breast: Secondary | ICD-10-CM | POA: Diagnosis not present

## 2018-11-10 DIAGNOSIS — Z170 Estrogen receptor positive status [ER+]: Secondary | ICD-10-CM | POA: Diagnosis not present

## 2018-11-10 DIAGNOSIS — K8020 Calculus of gallbladder without cholecystitis without obstruction: Secondary | ICD-10-CM | POA: Insufficient documentation

## 2018-11-16 ENCOUNTER — Telehealth: Payer: Self-pay

## 2018-11-16 NOTE — Telephone Encounter (Signed)
Spoke with patient regarding [place] results and that they will be forwarded to Dr. Barry Dienes.  They will call patient to schedule appointment.  Patient voiced understanding without questions/concerns at this time.

## 2018-11-16 NOTE — Telephone Encounter (Signed)
-----   Message from Gardenia Phlegm, NP sent at 11/16/2018  2:08 PM EST ----- Please let patient know that her ultrasound shows gall stones, which could be causing the pain.  I have sent the scan to Dr. Barry Dienes who is reviewing it, and should reach out to patient to schedule appt. ----- Message ----- From: Interface, Rad Results In Sent: 11/10/2018   4:09 PM EST To: Gardenia Phlegm, NP

## 2018-11-24 ENCOUNTER — Other Ambulatory Visit: Payer: Self-pay | Admitting: *Deleted

## 2018-11-24 MED ORDER — TELMISARTAN 40 MG PO TABS: 40.0000 mg | ORAL_TABLET | Freq: Every day | ORAL | 0 refills | Status: DC

## 2018-12-18 ENCOUNTER — Ambulatory Visit: Payer: Medicare Other | Admitting: Nurse Practitioner

## 2018-12-21 ENCOUNTER — Other Ambulatory Visit (INDEPENDENT_AMBULATORY_CARE_PROVIDER_SITE_OTHER): Payer: Medicare Other

## 2018-12-21 ENCOUNTER — Ambulatory Visit (INDEPENDENT_AMBULATORY_CARE_PROVIDER_SITE_OTHER): Payer: Medicare Other | Admitting: Nurse Practitioner

## 2018-12-21 ENCOUNTER — Encounter: Payer: Self-pay | Admitting: Nurse Practitioner

## 2018-12-21 VITALS — BP 130/70 | HR 81 | Ht 60.0 in | Wt 152.0 lb

## 2018-12-21 DIAGNOSIS — E119 Type 2 diabetes mellitus without complications: Secondary | ICD-10-CM

## 2018-12-21 DIAGNOSIS — I10 Essential (primary) hypertension: Secondary | ICD-10-CM

## 2018-12-21 DIAGNOSIS — R32 Unspecified urinary incontinence: Secondary | ICD-10-CM

## 2018-12-21 LAB — URINALYSIS, ROUTINE W REFLEX MICROSCOPIC
Bilirubin Urine: NEGATIVE
Hgb urine dipstick: NEGATIVE
Leukocytes, UA: NEGATIVE
Nitrite: NEGATIVE
RBC / HPF: NONE SEEN (ref 0–?)
Specific Gravity, Urine: 1.015 (ref 1.000–1.030)
Total Protein, Urine: NEGATIVE
URINE GLUCOSE: NEGATIVE
Urobilinogen, UA: 1 (ref 0.0–1.0)
pH: 6 (ref 5.0–8.0)

## 2018-12-21 LAB — POCT GLYCOSYLATED HEMOGLOBIN (HGB A1C): Hemoglobin A1C: 5.7 % — AB (ref 4.0–5.6)

## 2018-12-21 MED ORDER — LOSARTAN POTASSIUM 50 MG PO TABS: 50.0000 mg | ORAL_TABLET | Freq: Every day | ORAL | 3 refills | Status: DC

## 2018-12-21 MED ORDER — METFORMIN HCL 500 MG PO TABS: 500.0000 mg | ORAL_TABLET | Freq: Every day | ORAL | 1 refills | Status: DC

## 2018-12-21 NOTE — Patient Instructions (Addendum)
Head downstairs for labs today  Please try to check your blood pressure once daily or at least a few times a week, at the same time each day, and keep a log. Please follow up for readings >140/90  Please return in about 3 months for follow up, so I can recheck your blood pressure and A1c.   Kegel Exercises Kegel exercises help strengthen the muscles that support the rectum, vagina, small intestine, bladder, and uterus. Doing Kegel exercises can help:  Improve bladder and bowel control.  Improve sexual response.  Reduce problems and discomfort during pregnancy. Kegel exercises involve squeezing your pelvic floor muscles, which are the same muscles you squeeze when you try to stop the flow of urine. The exercises can be done while sitting, standing, or lying down, but it is best to vary your position. Exercises 1. Squeeze your pelvic floor muscles tight. You should feel a tight lift in your rectal area. If you are a female, you should also feel a tightness in your vaginal area. Keep your stomach, buttocks, and legs relaxed. 2. Hold the muscles tight for up to 10 seconds. 3. Relax your muscles. Repeat this exercise 50 times a day or as many times as told by your health care provider. Continue to do this exercise for at least 4-6 weeks or for as long as told by your health care provider. This information is not intended to replace advice given to you by your health care provider. Make sure you discuss any questions you have with your health care provider. Document Released: 11/18/2012 Document Revised: 04/14/2017 Document Reviewed: 10/22/2015 Elsevier Interactive Patient Education  2019 [employer].

## 2018-12-21 NOTE — Progress Notes (Signed)
Kristen Howe is a 73 y.o. female with the following history as recorded in EpicCare:  Patient Active Problem List   Diagnosis Date Noted  . Diabetes mellitus ([place]) 10/07/2017  . Patellofemoral pain syndrome of both knees 10/03/2017  . Anxiety 09/29/2017  . Current moderate episode of major depressive disorder ([place]) 09/29/2017  . Esophageal diverticulum 04/14/2017  . Esophageal motility disorder 04/14/2017  . Esophageal dysphagia   . Hypertriglyceridemia 09/03/2016  . Medicare annual wellness visit, subsequent 09/03/2016  . Overweight (BMI 25.0-29.9) 09/03/2016  . Osteoporosis 08/28/2016  . Bilateral breast cancer ([place]) 01/25/2016  . Tobacco abuse 12/22/2015  . Malignant neoplasm of upper-inner quadrant of left breast in female, estrogen receptor positive ([place]) 12/14/2015  . Lump in female breast 11/20/2015  . Umbilical hernia without obstruction and without gangrene 07/28/2015  . Essential hypertension 07/28/2015  . Seborrheic keratosis 07/28/2015    Current Outpatient Medications  Medication Sig Dispense Refill  . anastrozole (ARIMIDEX) 1 MG tablet Take 1 tablet (1 mg total) by mouth daily. 90 tablet 4  . Artificial Tear Ointment (DRY EYES OP) Apply 1 drop to eye daily as needed (for dry eyes).     Marland Kitchen atorvastatin (LIPITOR) 10 MG tablet Take 1 tablet (10 mg total) by mouth daily. 90 tablet 1  . Calcium-Phosphorus-Vitamin D (CITRACAL +D3 PO) Take 2 tablets by mouth 2 (two) times daily.     . cyclobenzaprine (FLEXERIL) 10 MG tablet Take 1 tablet (10 mg total) by mouth 3 (three) times daily as needed. for muscle spams 30 tablet 3  . glucose blood test strip Use as instructed 100 each 12  . hydrochlorothiazide (HYDRODIURIL) 25 MG tablet TAKE 1 TABLET BY MOUTH ONCE DAILY 30 tablet 1  . ibuprofen (ADVIL,MOTRIN) 200 MG tablet Take 400 mg by mouth 2 (two) times daily as needed for moderate pain.    . metFORMIN (GLUCOPHAGE) 500 MG tablet Take 1 tablet (500 mg total) by mouth daily with  breakfast. 180 tablet 1  . Multiple Vitamins-Minerals (MULTIVITAMIN WITH MINERALS) tablet Take 1 tablet by mouth daily. Reported on 12/20/2015    . ondansetron (ZOFRAN ODT) 4 MG disintegrating tablet Take 1 tablet (4 mg total) by mouth every 8 (eight) hours as needed for nausea or vomiting. 20 tablet 0  . pantoprazole (PROTONIX) 40 MG tablet TAKE 1 TABLET BY MOUTH TWICE DAILY BEFORE MEAL(S) 60 tablet 2  . venlafaxine XR (EFFEXOR-XR) 75 MG 24 hr capsule Take 1 capsule (75 mg total) by mouth daily with breakfast. 90 capsule 1  . losartan (COZAAR) 50 MG tablet Take 1 tablet (50 mg total) by mouth daily. 30 tablet 3   No current facility-administered medications for this visit.     Allergies: Patient has no known allergies.  Past Medical History:  Diagnosis Date  . Allergy   . Anemia    as a child  . Anxiety   . Arthritis    "lower back" (01/25/2016)  . Bilateral breast cancer ([place])    Archie Endo 01/25/2016  . Cataract   . Depression   . Esophageal diverticulum   . GERD (gastroesophageal reflux disease)   . Hyperlipidemia   . Hypertension   . Leg cramps    takes Flexeril  . Migraine    "maybe monthly" (01/25/2016)  . Osteoporosis   . Pneumonia ~ 2005 X 1  . Primary cancer of 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 breast ([place]) 12/14/2015  . Rheumatic fever 1950s    Past Surgical History:  Procedure Laterality Date  .  BREAST BIOPSY Bilateral 2016  . CATARACT EXTRACTION W/ INTRAOCULAR LENS  IMPLANT, BILATERAL Bilateral 05/2015-06/2015  . CESAREAN SECTION  1974; 1976  . COLONOSCOPY    . ESOPHAGEAL MANOMETRY N/A 03/10/2017   Procedure: ESOPHAGEAL MANOMETRY (EM);  Surgeon: Ladene Artist, MD;  Location: [facility];  Service: Endoscopy;  Laterality: N/A;  . EYE MUSCLE SURGERY Bilateral 1950s   for cross eyes as a child  . INSERTION OF MESH N/A 09/12/2015   Procedure: INSERTION OF MESH;  Surgeon: Erroll Luna, MD;  Location: [facility];  Service: General;  Laterality: N/A;  . MASTECTOMY COMPLETE /  SIMPLE W/ SENTINEL NODE BIOPSY Right 01/25/2016  . MASTECTOMY MODIFIED RADICAL Left 01/25/2016  . MASTECTOMY W/ SENTINEL NODE BIOPSY Bilateral 01/25/2016   Procedure: RIGHT MASTECTOMY WITH SENTINEL RIGHT LYMPH NODE BIOPSY, LEFT MODIFIED RADICAL MASTECTOMY;  Surgeon: Stark Klein, MD;  Location: [place];  Service: General;  Laterality: Bilateral;  . TONSILLECTOMY  1950s  . UMBILICAL HERNIA REPAIR N/A 09/12/2015   Procedure: REPAIR OF INCISIONAL AND UMBILICAL HERNIA;  Surgeon: Erroll Luna, MD;  Location: [facility];  Service: General;  Laterality: N/A;    Family History  Problem Relation Age of Onset  . Healthy Mother   . Other Mother        +"throat polyps"; +smoker  . Lymphoma Father        d. 65  . Diabetes Father   . Heart Problems Father   . Other Father        "tumors on his kidneys"  . Breast cancer Sister        dx. metachronous bilateral breast cancers at 19 and 23; - BRCA1/2 testing  . Cancer Maternal Grandmother 69       dx. spinal cancer  . Stroke Maternal Grandfather   . Gout Paternal Grandmother   . Gout Paternal Grandfather   . Lung cancer Maternal Uncle        d. 70-72; +smoker  . Diabetes Paternal Aunt   . Diabetes Paternal Uncle   . Breast cancer Cousin        maternal 1st cousin dx. unspecified age  . Lung cancer Cousin        maternal 1st cousin dx. lung cancer; +smoker  . Lupus Cousin   . Breast cancer Cousin        paternal 1st cousin dx. 44s  . Breast cancer Cousin        paternal 1st cousin dx. 40s-30s  . Cancer Cousin        paternal 1st cousin dx. oral cancer with metastasis, also "lost a leg to cancer"; dx. 34s; +EtOH abuse  . Breast cancer Cousin        paternal 1st cousin, once-removed dx. at unspecified age  . Colon cancer Neg Hx   . Esophageal cancer Neg Hx   . Rectal cancer Neg Hx   . Stomach cancer Neg Hx     Social History   Tobacco Use  . Smoking status: Former Smoker    Packs/day: 1.00    Years: 0.00    Pack years: 0.00    Types:  Cigarettes    Last attempt to quit: 12/24/2015    Years since quitting: 2.9  . Smokeless tobacco: Never Used  Substance Use Topics  . Alcohol use: No    Alcohol/week: 0.0 standard drinks    Comment: 07/31/16 pt states she dosn't drink anymore,04/16/2016 "1/2 bottle wine per month" previously - not much now  Subjective:  Ms Bally is here today for routine diabetes follow up, also requesting new medication for blood pressure and to discuss acute complaint of urinary incontinence.  Diabetes- maintained on metformin 500 BID  Reports recent glucose readings have been in the 90s Sometimes misses a meal and feels a little dizzy after. Denies syncope, tremor, diaphoresis, polyuria, polydipsia, polyphagia.  Lab Results  Component Value Date   HGBA1C 5.7 (A) 12/21/2018    Hypertension -maintained on hctz 25, telmisartan 40 daily. She was actually switched from losartan- hctz combo pill several months ago because the combo pill was never available in her pharmacy, she has been taking both medications daily as prescribed but the telmisartan is very expensive and shed like another option. Does not routinely check home reading but she can if needed Denies headaches, vision changes, chest pain, shortness of breath, edema.  BP Readings from Last 3 Encounters:  12/21/18 130/70  11/04/18 100/70  11/03/18 113/65   Urinary incontinence- this is not a new problem, this has been ongoing for months, this is intermittent, last episode occurred about 2 weeks ago, she feels the sudden urge to urinate and was unable to make it to restroom in time. She denies fevers, chills, abdominal pain, n/v/d, hematuria, dysuria, frequency.  ROS- See HPI  Objective:  Vitals:   12/21/18 1031  BP: 130/70  Pulse: 81  SpO2: 93%  Weight: 152 lb (68.9 kg)  Height: 5' (1.524 m)    General: Well developed, well nourished, in no acute distress  Skin : Warm and dry.  Head: Normocephalic and atraumatic  Eyes: Sclera  and conjunctiva clear; pupils round and reactive to light; extraocular movements intact  Oropharynx: Pink, supple. No suspicious lesions  Neck: Supple Lungs: Respirations unlabored; clear to auscultation bilaterally without wheeze, rales, rhonchi  CVS exam: normal rate and regular rhythm, S1 and S2 normal.  Abdomen: Soft; nontender; nondistended; no masses or hepatosplenomegaly  Extremities: No edema, cyanosis, clubbing  Vessels: Symmetric bilaterally  Neurologic: Alert and oriented; speech intact; face symmetrical; moves all extremities well; CNII-XII intact without focal deficit  Psychiatric: Normal mood and affect.   Assessment:  1. Type 2 diabetes mellitus without complication, without long-term current use of insulin ([place])   2. Urinary incontinence, unspecified type   3. Essential hypertension     Plan:   Urinary incontinence, unspecified type Check urine testing  Home management, pelvic exercises, red flags and return precautions including when to seek immediate care discussed and printed on AVS Could consider treatment for OAB, depending on test results, will follow up when urine testing returns   Return in about 3 months (around 03/22/2019) for F/U: DM- recheck A1c; HTN- recheck BP.  Orders Placed This Encounter  Procedures  . CULTURE, URINE COMPREHENSIVE    Standing Status:   Future    Standing Expiration Date:   01/21/2019  . Urinalysis, Routine w reflex microscopic    Standing Status:   Future    Standing Expiration Date:   12/21/2019  . POCT glycosylated hemoglobin (Hb A1C)    Requested Prescriptions   Signed Prescriptions Disp Refills  . metFORMIN (GLUCOPHAGE) 500 MG tablet 180 tablet 1    Sig: Take 1 tablet (500 mg total) by mouth daily with breakfast.  . losartan (COZAAR) 50 MG tablet 30 tablet 3    Sig: Take 1 tablet (50 mg total) by mouth daily.

## 2018-12-21 NOTE — Assessment & Plan Note (Signed)
Due to lower a1c and missing meals, will reduce metformin dosage to once daily RTC in 3 months for F/U - recheck A1c

## 2018-12-21 NOTE — Assessment & Plan Note (Signed)
Stop telmisartan, start losartan Continue HCTZ at current dosage Instructed to monitor BP readings at home, f/u for readings >140/90 RTC in 3 months for F/U- recheck BP

## 2018-12-23 ENCOUNTER — Other Ambulatory Visit: Payer: Self-pay | Admitting: Family

## 2018-12-24 ENCOUNTER — Other Ambulatory Visit: Payer: Self-pay | Admitting: Nurse Practitioner

## 2018-12-24 LAB — CULTURE, URINE COMPREHENSIVE
MICRO NUMBER: 15526
SPECIMEN QUALITY:: ADEQUATE

## 2018-12-24 MED ORDER — NITROFURANTOIN MONOHYD MACRO 100 MG PO CAPS: 100.0000 mg | ORAL_CAPSULE | Freq: Two times a day (BID) | ORAL | 0 refills | Status: DC

## 2018-12-24 NOTE — Progress Notes (Signed)
orders

## 2019-04-12 ENCOUNTER — Telehealth: Payer: Self-pay | Admitting: *Deleted

## 2019-04-12 DIAGNOSIS — I10 Essential (primary) hypertension: Secondary | ICD-10-CM

## 2019-04-12 DIAGNOSIS — E119 Type 2 diabetes mellitus without complications: Secondary | ICD-10-CM

## 2019-04-12 MED ORDER — LOSARTAN POTASSIUM 50 MG PO TABS: 50.0000 mg | ORAL_TABLET | Freq: Every day | ORAL | 0 refills | Status: DC

## 2019-04-12 MED ORDER — GLUCOSE BLOOD VI STRP: 1.0000 | ORAL_STRIP | Freq: Every day | 0 refills | Status: DC | PRN

## 2019-04-12 MED ORDER — PANTOPRAZOLE SODIUM 40 MG PO TBEC: DELAYED_RELEASE_TABLET | ORAL | 0 refills | Status: DC

## 2019-04-12 NOTE — Addendum Note (Signed)
Addended by: Cresenciano Lick on: 04/12/2019 02:56 PM   Modules accepted: Orders

## 2019-04-12 NOTE — Addendum Note (Signed)
Addended by: Cresenciano Lick on: 04/12/2019 04:33 PM   Modules accepted: Orders

## 2019-04-14 ENCOUNTER — Telehealth: Payer: Self-pay | Admitting: *Deleted

## 2019-04-14 NOTE — Telephone Encounter (Signed)
Left mess for patient to call back to schedule visit with Wilfred Lacy, NP or Beverely Risen., NP in July 2020 per Jodi Mourning, NP. She will be due for BP check and A1c.

## 2019-04-19 MED ORDER — GLUCOSE BLOOD VI STRP: 1.0000 | ORAL_STRIP | Freq: Every day | 0 refills | Status: DC | PRN

## 2019-04-19 NOTE — Addendum Note (Signed)
Addended by: Cresenciano Lick on: 04/19/2019 04:53 PM   Modules accepted: Orders

## 2019-04-19 NOTE — Telephone Encounter (Signed)
Rx re-printed and faxed to [facility] @ [phone]. See meds.

## 2019-04-19 NOTE — Telephone Encounter (Addendum)
Pt is calling and said the pharm never received accu chek testing strips please resend. [facility]

## 2019-04-26 MED ORDER — GLUCOSE BLOOD VI STRP: 1.0000 | ORAL_STRIP | Freq: Every day | 0 refills | Status: DC | PRN

## 2019-04-26 NOTE — Telephone Encounter (Addendum)
Patient called in again today stating [facility] [address] still has not received glucose test strips. Rxs were previously printed and faxed to [facility].  I called [facility] today and left a vm giving verbal Rx. See meds. Pt aware.

## 2019-04-26 NOTE — Addendum Note (Signed)
Addended by: Cresenciano Lick on: 04/26/2019 10:45 AM   Modules accepted: Orders

## 2019-04-28 ENCOUNTER — Telehealth: Payer: Self-pay | Admitting: Family

## 2019-04-28 DIAGNOSIS — E119 Type 2 diabetes mellitus without complications: Secondary | ICD-10-CM

## 2019-04-28 NOTE — Telephone Encounter (Signed)
Copied from [place] [phone]. Topic: Quick Communication - Rx Refill/Question >> Apr 28, 2019  3:44 PM [place], [place] D wrote: Medication: glucose blood test strip / Pt stated she has not been able to get her test strips from the pharmacy. Agent spoke with pharmacy and they stated they need a hard copy either electronic or fax with the diagnosis code and directions for strips in order for them to fill this. Please send rx to pharmacy as pt stated she has been out of strips for two weeks.  Has the patient contacted their pharmacy? Yes.   (Agent: If no, request that the patient contact the pharmacy for the refill.) (Agent: If yes, when and what did the pharmacy advise?)  Preferred Pharmacy (with phone number or street name): [place], [place] - [address] [phone] (Phone) [phone] (Fax)    Agent: Please be advised that RX refills may take up to 3 business days. We ask that you follow-up with your pharmacy.

## 2019-04-29 MED ORDER — GLUCOSE BLOOD VI STRP: 1.0000 | ORAL_STRIP | Freq: Every day | 0 refills | Status: DC | PRN

## 2019-04-29 NOTE — Telephone Encounter (Signed)
I called [facility]- they state they never received any of the rxs I previously sent. They did receive my verbal refill, but could not fill that due to Medicare guidelines. Pharmacy states Rx needs to be printed and faxed. Rx printed and faxed to pharmacy @ [phone]. This is my 4th attempt.   Tried to call pt to advise she may have to come to our office to pick this printed rx up if her pharmacy doesn't contact her to fill it. No answer/unable to leave vm. Patient's # just rings fast busy after a few rings.

## 2019-05-03 ENCOUNTER — Telehealth: Payer: Self-pay | Admitting: Oncology

## 2019-05-03 NOTE — Telephone Encounter (Signed)
Per 5/12 schedule message covert 5/19 appointment to webex and moved to [facility] on another date/time. Was awaiting response re whether injection should be cancel/ confirmed with desk nurse today injection should be cancelled. Per desk nurse when provider requests a visit be converted to webex all associated appointments should be cancelled.   Spoke with patient re webex 5/20 with LC. Patient cannot do webex and prefers phone call.

## 2019-05-04 ENCOUNTER — Inpatient Hospital Stay: Payer: Medicare Other

## 2019-05-04 ENCOUNTER — Inpatient Hospital Stay: Payer: Medicare Other | Admitting: Oncology

## 2019-05-05 ENCOUNTER — Telehealth: Payer: Self-pay | Admitting: Emergency Medicine

## 2019-05-05 ENCOUNTER — Ambulatory Visit (INDEPENDENT_AMBULATORY_CARE_PROVIDER_SITE_OTHER): Payer: Medicare Other | Admitting: Internal Medicine

## 2019-05-05 ENCOUNTER — Encounter: Payer: Self-pay | Admitting: Internal Medicine

## 2019-05-05 ENCOUNTER — Encounter: Payer: Self-pay | Admitting: Adult Health

## 2019-05-05 ENCOUNTER — Inpatient Hospital Stay: Payer: Medicare Other | Attending: Adult Health | Admitting: Adult Health

## 2019-05-05 DIAGNOSIS — C50012 Malignant neoplasm of nipple and areola, left female breast: Secondary | ICD-10-CM | POA: Diagnosis not present

## 2019-05-05 DIAGNOSIS — Z170 Estrogen receptor positive status [ER+]: Secondary | ICD-10-CM | POA: Diagnosis not present

## 2019-05-05 DIAGNOSIS — R05 Cough: Secondary | ICD-10-CM | POA: Diagnosis not present

## 2019-05-05 DIAGNOSIS — C50011 Malignant neoplasm of nipple and areola, right female breast: Secondary | ICD-10-CM

## 2019-05-05 DIAGNOSIS — R059 Cough, unspecified: Secondary | ICD-10-CM | POA: Insufficient documentation

## 2019-05-05 DIAGNOSIS — C50212 Malignant neoplasm of upper-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 breast: Secondary | ICD-10-CM

## 2019-05-05 MED ORDER — DOXYCYCLINE HYCLATE 100 MG PO TABS: 100.0000 mg | ORAL_TABLET | Freq: Two times a day (BID) | ORAL | 0 refills | Status: DC

## 2019-05-05 MED ORDER — HYDROCODONE-HOMATROPINE 5-1.5 MG/5ML PO SYRP: 5.0000 mL | ORAL_SOLUTION | Freq: Four times a day (QID) | ORAL | 0 refills | Status: AC | PRN

## 2019-05-05 NOTE — Telephone Encounter (Signed)
Pt called and states she has had a cough and SOB for 3 weeks. Pt does not have access to do virtual, are you okay with a phone visit?

## 2019-05-05 NOTE — Telephone Encounter (Signed)
Called pt to schedule phone visit.

## 2019-05-05 NOTE — Progress Notes (Signed)
Cumulative time during 7-day interval 17 min, there was not an associated office visit for this concern within a 7 day period.  Verbal consent for services obtained from patient prior to services given.  Names of all persons present for services: Cathlean Cower, MD, patient  Chief complaint: cough x 3 wks  History, background, results pertinent 73yo F with hx of allergies and GERD, who present with sudden onset scant prod cough with off white yellowsh sputum without blood, and HA, sneezing, mild sob, general weakness and malaise, with prod cough greenish sputum, but Pt denies chest pain, wheezing, orthopnea, PND, increased LE swelling, palpitations, dizziness or syncope.  Has mild allergies but does not believe she has worsening post nasal gtt or reflux symptoms.  Has good compliance with meds including PPI,  Denies fever or chills. :  Past Medical History:  Diagnosis Date  . Allergy   . Anemia    as a child  . Anxiety   . Arthritis    "lower back" (01/25/2016)  . Bilateral breast cancer ([place])    Archie Endo 01/25/2016  . Cataract   . Depression   . Esophageal diverticulum   . GERD (gastroesophageal reflux disease)   . Hyperlipidemia   . Hypertension   . Leg cramps    takes Flexeril  . Migraine    "maybe monthly" (01/25/2016)  . Osteoporosis   . Pneumonia ~ 2005 X 1  . Primary cancer of 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 breast ([place]) 12/14/2015  . Rheumatic fever 1950s   No results found for this or any previous visit (from the past 48 hour(s)).  Current Outpatient Medications on File Prior to Visit  Medication Sig Dispense Refill  . anastrozole (ARIMIDEX) 1 MG tablet Take 1 tablet (1 mg total) by mouth daily. 90 tablet 4  . Artificial Tear Ointment (DRY EYES OP) Apply 1 drop to eye daily as needed (for dry eyes).     Marland Kitchen atorvastatin (LIPITOR) 10 MG tablet Take 1 tablet (10 mg total) by mouth daily. 90 tablet 1  . Calcium-Phosphorus-Vitamin D (CITRACAL +D3 PO) Take 2 tablets by mouth 2  (two) times daily.     . cyclobenzaprine (FLEXERIL) 10 MG tablet Take 1 tablet (10 mg total) by mouth 3 (three) times daily as needed. for muscle spams 30 tablet 3  . glucose blood test strip 1 each by Other route daily as needed for other. Use as instructed.  Dx: E11.9 100 each 0  . hydrochlorothiazide (HYDRODIURIL) 25 MG tablet TAKE 1 TABLET BY MOUTH ONCE DAILY 90 tablet 1  . ibuprofen (ADVIL,MOTRIN) 200 MG tablet Take 400 mg by mouth 2 (two) times daily as needed for moderate pain.    Marland Kitchen losartan (COZAAR) 50 MG tablet Take 1 tablet (50 mg total) by mouth daily. Needs visit for future refills. 30 tablet 0  . metFORMIN (GLUCOPHAGE) 500 MG tablet Take 1 tablet (500 mg total) by mouth daily with breakfast. 180 tablet 1  . Multiple Vitamins-Minerals (MULTIVITAMIN WITH MINERALS) tablet Take 1 tablet by mouth daily. Reported on 12/20/2015    . pantoprazole (PROTONIX) 40 MG tablet TAKE 1 TABLET BY MOUTH TWICE DAILY BEFORE MEAL(S) 60 tablet 0  . venlafaxine XR (EFFEXOR-XR) 75 MG 24 hr capsule Take 1 capsule (75 mg total) by mouth daily with breakfast. 90 capsule 1   No current facility-administered medications on file prior to visit.    A/P/next steps:  Cough  - likely infectious, ? Viral vs other, for empiric doxycycline, cough  med prn, and for CXR when office open in the AM.  Cathlean Cower MD

## 2019-05-05 NOTE — Assessment & Plan Note (Signed)
See above

## 2019-05-05 NOTE — Progress Notes (Signed)
SURVIVORSHIP VIRTUAL VISIT:  I connected with Kristen Howe on 05/05/19 at  8:00 AM EDT by telephone and verified that I am speaking with the correct person using two identifiers.   I discussed the limitations, risks, security and privacy concerns of performing an evaluation and management service by telephone and the availability of in person appointments. I also discussed with the patient that there may be a patient responsible charge related to this service. The patient expressed understanding and agreed to proceed.     REASON FOR VISIT:  Routine follow-up for history of breast cancer.   BRIEF ONCOLOGIC HISTORY:    Malignant neoplasm of upper-inner quadrant of left breast in female, estrogen receptor positive ([place])   12/14/2015 Initial Diagnosis    Primary cancer of 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 breast ([place])     Bilateral breast cancer ([place])   12/12/2015 Initial Biopsy    LEFT breast (11:30) biopsy: IDC, grade 2. ER+ (100%), PR+ (70%), Ki67 20%, HER2 neg (ratio 1.59).  LEFT axilla LN (+) metastatic carcinoma.  LEFT retroareolar biopsy: Ductal carcinoma. ER+ (100%), PR+ (60%), Ki67 5%, HER2 neg (ratio 1.73).     12/12/2015 Initial Biopsy    RIGHT breast (9:30) biopsy: IDC, grade 2. ER+ (100%), PR+ (80%), Ki67 20%, HER2 neg (ratio 1.59).     12/12/2015 Initial Diagnosis    Bilateral breast cancer ([place])    01/04/2016 Imaging    Bone scan: No evidence of metastatic disease .    01/04/2016 Imaging    CT chest: Bilateral breast masses, suspicious for breast carcinomas. Mild bilateral axillary lymphadenopathy, suspicious for metastatic disease. No other sites of metastatic diease in thorax.     01/25/2016 Surgery    Bilateral mastectomies with SLNB Kristen Howe).  LEFT modified radical mastectomy: IDC & DCIS, grade 2, 6.5cm, LVI (+), 1/15 LNs (+) for metastatic carcinoma. Negative margins. ER+ (90%), PR+ (70%), Ki67 20%, HER2 neg (ratio 1.48).   pT3, pN1a: Stage IIIA    01/25/2016  Surgery    Bilateral mastectomies with SLNB Kristen Howe). RIGHT simple mastectomy: IDC spanning 2.1 cm, grade 2, separate focus DCIS 0.5 cm. 1/4 LNs (+) for metatatic carcinoma.  Negative margins. HER2 repeated & remains neg. (ratio 1.20).   pT2, pN1a: Stage IIB    01/25/2016 Miscellaneous    Mammaprint (LEFT breast): Low-risk (+0.221), Luminal-type (+0.81)    01/25/2016 Miscellaneous    Mammaprint (RIGHT breast): Low-risk (+0.226), Luminal-type (+1.427).     02/21/2016 Miscellaneous    Tumor board discussion: No chemotherapy and no (R)-sided lymph node dissection.     03/21/2016 - 05/06/2016 Radiation Therapy    Adjuvant radiation Kristen Howe). BILAT chest wall & supraclavicular regions: 50.4 Gy in 28 fractions.  BILAT scar boost: 10 Gy in 5 fractions.     04/16/2016 Procedure    Genetic testing: Normal. Genes analyzed: ATM, BARD1, BRCA1, BRCA2, BRIP1, CDH1, CHEK2, FANCC, MLH1, MSH2, MSH6, NBN, PALB2, PMS2, PTEN, RAD51C, RAD51D, TP53, and XRCC2    06/2016 -  Anti-estrogen oral therapy    Anastrazole 1 mg daily. Planned duration of treatment: 5 years Therapist, nutritional)       INTERVAL HISTORY:  Kristen Howe [facility] today for routine follow-up for her history of breast cancer.  Overall, she reports feeling quite well. She denies any chest wall concerns.  She has intermittent nerve pain, that is very occasional and brief.  She has no new nodules or pain.  She does have a cough x 3 weeks that is dry  and non productive.  She notes that she goes grocery shopping and out in public without a mask.  She denies any recent known sick contacts or exposures to [place].  She is a former smoker and quit smoking about 3 years ago.  Kristen Howe has some vaginal dryness that is her baseline.  She is not sexually active.  She does not exercise regularly, and she does her best to follow a diabetic diet.    REVIEW OF SYSTEMS:  Review of Systems  Constitutional: Negative for appetite change, chills, fatigue, fever  and unexpected weight change.  HENT:   Negative for hearing loss, sore throat and trouble swallowing.   Eyes: Negative for eye problems and icterus.  Respiratory: Positive for cough. Negative for chest tightness and shortness of breath.   Cardiovascular: Negative for chest pain, leg swelling and palpitations.  Gastrointestinal: Negative for abdominal distention, abdominal pain, constipation, nausea and vomiting.  Endocrine: Negative for hot flashes.  Genitourinary: Negative for difficulty urinating.   Musculoskeletal: Negative for arthralgias.  Skin: Negative for itching and rash.  Neurological: Negative for dizziness, extremity weakness, headaches and numbness.  Hematological: Negative for adenopathy. Does not bruise/bleed easily.  Psychiatric/Behavioral: Negative for depression. The patient is not nervous/anxious.   Breast: Denies any new nodularity, masses, tenderness, nipple changes, or nipple discharge.       PAST MEDICAL/SURGICAL HISTORY:  Past Medical History:  Diagnosis Date   Allergy    Anemia    as a child   Anxiety    Arthritis    "lower back" (01/25/2016)   Bilateral breast cancer ([place])    Kristen Howe 01/25/2016   Cataract    Depression    Esophageal diverticulum    GERD (gastroesophageal reflux disease)    Hyperlipidemia    Hypertension    Leg cramps    takes Flexeril   Migraine    "maybe monthly" (01/25/2016)   Osteoporosis    Pneumonia ~ 2005 X 1   Primary cancer of 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 breast ([place]) 12/14/2015   Rheumatic fever 1950s   Past Surgical History:  Procedure Laterality Date   BREAST BIOPSY Bilateral 2016   CATARACT EXTRACTION W/ INTRAOCULAR LENS  IMPLANT, BILATERAL Bilateral 05/2015-06/2015   CESAREAN SECTION  1974; 1976   COLONOSCOPY     ESOPHAGEAL MANOMETRY N/A 03/10/2017   Procedure: ESOPHAGEAL MANOMETRY (EM);  Surgeon: Kristen Artist, MD;  [facility]: [facility];  Service: Endoscopy;  Laterality: N/A;   EYE  MUSCLE SURGERY Bilateral 1950s   for cross eyes as a child   INSERTION OF MESH N/A 09/12/2015   Procedure: INSERTION OF MESH;  Surgeon: Kristen Luna, MD;  Location: [facility];  Service: General;  Laterality: N/A;   MASTECTOMY COMPLETE / SIMPLE W/ SENTINEL NODE BIOPSY Right 01/25/2016   MASTECTOMY MODIFIED RADICAL Left 01/25/2016   MASTECTOMY W/ SENTINEL NODE BIOPSY Bilateral 01/25/2016   Procedure: RIGHT MASTECTOMY WITH SENTINEL RIGHT LYMPH NODE BIOPSY, LEFT MODIFIED RADICAL MASTECTOMY;  Surgeon: Stark Klein, MD;  Location: [facility];  Service: General;  Laterality: Bilateral;   TONSILLECTOMY  [id number]   UMBILICAL HERNIA REPAIR N/A 09/12/2015   Procedure: REPAIR OF INCISIONAL AND UMBILICAL HERNIA;  Surgeon: Kristen Luna, MD;  Location: [facility];  Service: General;  Laterality: N/A;     ALLERGIES:  No Known Allergies   CURRENT MEDICATIONS:  Outpatient Encounter Medications as of 05/05/2019  Medication Sig   anastrozole (ARIMIDEX) 1 MG tablet Take 1 tablet (1 mg total) by mouth daily.  Artificial Tear Ointment (DRY EYES OP) Apply 1 drop to eye daily as needed (for dry eyes).    atorvastatin (LIPITOR) 10 MG tablet Take 1 tablet (10 mg total) by mouth daily.   Calcium-Phosphorus-Vitamin D (CITRACAL +D3 PO) Take 2 tablets by mouth 2 (two) times daily.    cyclobenzaprine (FLEXERIL) 10 MG tablet Take 1 tablet (10 mg total) by mouth 3 (three) times daily as needed. for muscle spams   glucose blood test strip 1 each by Other route daily as needed for other. Use as instructed.  Dx: E11.9   hydrochlorothiazide (HYDRODIURIL) 25 MG tablet TAKE 1 TABLET BY MOUTH ONCE DAILY   ibuprofen (ADVIL,MOTRIN) 200 MG tablet Take 400 mg by mouth 2 (two) times daily as needed for moderate pain.   losartan (COZAAR) 50 MG tablet Take 1 tablet (50 mg total) by mouth daily. Needs visit for future refills.   metFORMIN (GLUCOPHAGE) 500 MG tablet Take 1 tablet (500 mg total) by mouth daily with breakfast.   Multiple  Vitamins-Minerals (MULTIVITAMIN WITH MINERALS) tablet Take 1 tablet by mouth daily. Reported on 12/20/2015   nitrofurantoin, macrocrystal-monohydrate, (MACROBID) 100 MG capsule Take 1 capsule (100 mg total) by mouth 2 (two) times daily.   ondansetron (ZOFRAN ODT) 4 MG disintegrating tablet Take 1 tablet (4 mg total) by mouth every 8 (eight) hours as needed for nausea or vomiting.   pantoprazole (PROTONIX) 40 MG tablet TAKE 1 TABLET BY MOUTH TWICE DAILY BEFORE MEAL(S)   venlafaxine XR (EFFEXOR-XR) 75 MG 24 hr capsule Take 1 capsule (75 mg total) by mouth daily with breakfast.   No facility-administered encounter medications on file as of 05/05/2019.      ONCOLOGIC FAMILY HISTORY:  Family History  Problem Relation Age of Onset   Healthy Mother    Other Mother        +"throat polyps"; +smoker   Lymphoma Father        d. 4   Diabetes Father    Heart Problems Father    Other Father        "tumors on his kidneys"   Breast cancer Sister        dx. metachronous bilateral breast cancers at 6 and 48; - BRCA1/2 testing   Cancer Maternal Grandmother 42       dx. spinal cancer   Stroke Maternal Grandfather    Gout Paternal Grandmother    Gout Paternal Grandfather    Lung cancer Maternal Uncle        d. 9-72; +smoker   Diabetes Paternal Aunt    Diabetes Paternal Uncle    Breast cancer Cousin        maternal 1st cousin dx. unspecified age   Lung cancer Cousin        maternal 1st cousin dx. lung cancer; +smoker   Lupus Cousin    Breast cancer Cousin        paternal 1st cousin dx. 70s   Breast cancer Cousin        paternal 1st cousin dx. 16s-30s   Cancer Cousin        paternal 1st cousin dx. oral cancer with metastasis, also "lost a leg to cancer"; dx. 40s; +EtOH abuse   Breast cancer Cousin        paternal 1st cousin, once-removed dx. at unspecified age   Colon cancer Neg Hx    Esophageal cancer Neg Hx    Rectal cancer Neg Hx    Stomach cancer Neg Hx  GENETIC COUNSELING/TESTING: Genetic testing was normal, and did not reveal a deleterious mutation in these genes.  Additionally, no variants of uncertain significance (VUSes) were found. Genes tested include: ATM, BARD1, BRCA1, BRCA2, BRIP1, CDH1, CHEK2, FANCC, MLH1, MSH2, MSH6, NBN, PALB2, PMS2, PTEN, RAD51C, RAD51D, TP53, and XRCC2.  This panel also includes deletion/duplication analysis (without sequencing) for one gene, EPCAM.  SOCIAL HISTORY:  Social History   Socioeconomic History   Marital status: Divorced    Spouse name: Not on file   Number of children: 2   Years of education: 12   Highest education level: Not on file  Occupational History   Occupation: retired  Scientist, product/process development strain: Not hard at all   Food insecurity:    Worry: Never true    Inability: Never true   Transportation needs:    Medical: No    Non-medical: No  Tobacco Use   Smoking status: Former Smoker    Packs/day: 1.00    Years: 0.00    Pack years: 0.00    Types: Cigarettes    Last attempt to quit: 12/24/2015    Years since quitting: 3.3   Smokeless tobacco: Never Used  Substance and Sexual Activity   Alcohol use: No    Alcohol/week: 0.0 standard drinks    Comment: 07/31/16 pt states she dosn't drink anymore,04/16/2016 "1/2 bottle wine per month" previously - not much now   Drug use: No   Sexual activity: Never  Lifestyle   Physical activity:    Days per week: Not on file    Minutes per session: Not on file   Stress: Not on file  Relationships   Social connections:    Talks on phone: Not on file    Gets together: Not on file    Attends religious service: Not on file    Active member of club or organization: Not on file    Attends meetings of clubs or organizations: Not on file    Relationship status: Not on file   Intimate partner violence:    Fear of current or ex partner: Not on file    Emotionally abused: Not on file    Physically abused: Not on file     Forced sexual activity: Not on file  Other Topics Concern   Not on file  Social History Narrative   Lives at home with sister and nephews.   Fun: Internet; games, talk to people, video games   Denies religious beliefs effecting health care.   Denies abuse and feels safe at home.       OBJECTIVE:  Patient is in no apparent distress.  Breathing is non labored.  Speech is normal.  Her mood and behavior are normal.    LABORATORY DATA:  None for this visit   DIAGNOSTIC IMAGING:  Most recent mammogram: n/a s/p bilateral mastectomies    ASSESSMENT AND PLAN:  Ms.. Amescua is a pleasant 73 y.o. female with history of bilateral breast cancer, ER+/PR+/HER2-, diagnosed in 11/2015, treated with bilateral mastectomies, adjuvant radiation therapy, and anti-estrogen therapy with Anastrozole beginning in 06/2016.  She presents to the [facility] for surveillance and routine follow-up.   1. History of breast cancer:  Ms. Stcharles is currently clinically and radiographically without evidence of disease or recurrence of breast cancer.  She will continue her anti-estrogen therapy with Anastrozole, with plans to continue for 5 years.  She will return to the cancer center to see her medical oncologist, Dr. Jana Hakim in 10/2019.  I encouraged her to call me with any questions or concerns before her next visit at the cancer center, and I would be happy to see her sooner, if needed.    2.  Cough:  I let her know that she should call her PCP to have her cough evaluated as it isn't normal to have a new cough for three weeks.  I also recommended that until evaluated by her PCP, she shouldn't be around anyone else unless he is sure she doesn't fit COVID 19 testing criteria.    3. Bone health:  Given Ms. Reim's age, history of breast cancer, and her current anti-estrogen therapy with Anastrozole, she is at risk for bone demineralization. Her last DEXA scan was in 07/2018 and was consistent with  osteopenia with a T score of -2.2.  Her next bone density is due in 07/2020.  In the meantime, she was encouraged to increase her consumption of foods rich in calcium, as well as increase her weight-bearing activities.  She was given education on specific food and activities to promote bone health.  4. Cancer screening:  Due to Ms. Luviano's history and her age, she should receive screening for skin cancers, colon cancer.  She is up to date with her cancer screenings.  Due to her 10 pack year tobacco history, she does not meet criteria for lung cancer screening. She was encouraged to follow-up with her PCP for appropriate cancer screenings.   5. Health maintenance and wellness promotion: Ms. Geis was encouraged to consume 5-7 servings of fruits and vegetables per day. She was also encouraged to engage in moderate to vigorous exercise for 30 minutes per day most days of the week. She was instructed to limit her alcohol consumption and continue to abstain from tobacco use.    Follow up instructions:    -Return to cancer center in 6 months for f/u with Dr. Jana Hakim -Bone density in 07/2020    The patient was provided an opportunity to ask questions and all were answered. The patient agreed with the plan and demonstrated an understanding of the instructions.   The patient was advised to call back or seek an in-person evaluation if the symptoms worsen or if the condition fails to improve as anticipated.   I provided 30 minutes of face-to-face video visit time during this encounter, and > 50% was spent counseling as documented under my assessment & plan.   Gardenia Phlegm, NP [facility] [place] [phone]   Note: PRIMARY CARE PROVIDER Lance Sell, NP None None

## 2019-05-05 NOTE — Patient Instructions (Signed)
Please take all new medication as prescribed - the antibiotic, and cough medicine if needed  OK to also take OTC Allegra for allergies  Please continue all other medications as before, and refills have been done if requested.  Please have the pharmacy call with any other refills you may need.  Please keep your appointments with your specialists as you may have planned  Please go to the XRAY Department in the Basement (go straight as you get off the elevator) for the x-ray testing  You will be contacted by phone if any changes need to be made immediately.  Otherwise, you will receive a letter about your results with an explanation, but please check with MyChart first.  Please remember to sign up for MyChart if you have not done so, as this will be important to you in the future with finding out test results, communicating by private email, and scheduling acute appointments online when needed.

## 2019-05-05 NOTE — Patient Instructions (Signed)

## 2019-05-05 NOTE — Telephone Encounter (Signed)
Ok for this, can be today or this evening if she prefers, thanks

## 2019-05-06 ENCOUNTER — Ambulatory Visit (INDEPENDENT_AMBULATORY_CARE_PROVIDER_SITE_OTHER)
Admission: RE | Admit: 2019-05-06 | Discharge: 2019-05-06 | Disposition: A | Discharge status: Home / Self Care | Payer: Medicare Other | Source: Ambulatory Visit | Attending: Internal Medicine | Admitting: Internal Medicine

## 2019-05-06 ENCOUNTER — Telehealth: Payer: Self-pay

## 2019-05-06 ENCOUNTER — Telehealth: Payer: Self-pay | Admitting: Hematology and Oncology

## 2019-05-06 ENCOUNTER — Encounter: Payer: Self-pay | Admitting: Internal Medicine

## 2019-05-06 ENCOUNTER — Other Ambulatory Visit: Payer: Self-pay

## 2019-05-06 DIAGNOSIS — R059 Cough, unspecified: Secondary | ICD-10-CM

## 2019-05-06 DIAGNOSIS — R05 Cough: Secondary | ICD-10-CM

## 2019-05-06 NOTE — Telephone Encounter (Signed)
Pt has been informed of results and expressed understanding.  °

## 2019-05-06 NOTE — Telephone Encounter (Signed)
Tried to reach regarding schedule °

## 2019-05-06 NOTE — Telephone Encounter (Signed)
-----   Message from Biagio Borg, MD sent at 05/06/2019 12:46 PM EDT ----- Letter sent, cont same tx  Ok to let pt know cxr is negative, and letter is coming, no need to change any further tx for now

## 2019-05-07 ENCOUNTER — Encounter: Payer: Self-pay | Admitting: Adult Health

## 2019-05-10 ENCOUNTER — Other Ambulatory Visit: Payer: Self-pay | Admitting: Internal Medicine

## 2019-05-10 DIAGNOSIS — I10 Essential (primary) hypertension: Secondary | ICD-10-CM

## 2019-05-12 ENCOUNTER — Ambulatory Visit: Payer: Medicare Other | Admitting: Adult Health

## 2019-05-19 ENCOUNTER — Other Ambulatory Visit: Payer: Self-pay

## 2019-05-19 ENCOUNTER — Other Ambulatory Visit: Payer: Self-pay | Admitting: *Deleted

## 2019-05-19 DIAGNOSIS — E785 Hyperlipidemia, unspecified: Secondary | ICD-10-CM

## 2019-05-19 MED ORDER — ATORVASTATIN CALCIUM 10 MG PO TABS: 10.0000 mg | ORAL_TABLET | Freq: Every day | ORAL | 0 refills | Status: DC

## 2019-05-19 MED ORDER — HYDROCHLOROTHIAZIDE 25 MG PO TABS: 25.0000 mg | ORAL_TABLET | Freq: Every day | ORAL | 0 refills | Status: DC

## 2019-06-07 ENCOUNTER — Other Ambulatory Visit: Payer: Self-pay | Admitting: Oncology

## 2019-06-07 ENCOUNTER — Other Ambulatory Visit: Payer: Self-pay

## 2019-06-07 MED ORDER — ANASTROZOLE 1 MG PO TABS: 1.0000 mg | ORAL_TABLET | Freq: Every day | ORAL | 0 refills | Status: DC

## 2019-06-08 ENCOUNTER — Other Ambulatory Visit: Payer: Self-pay | Admitting: Internal Medicine

## 2019-06-22 ENCOUNTER — Other Ambulatory Visit: Payer: Self-pay | Admitting: Oncology

## 2019-06-24 ENCOUNTER — Other Ambulatory Visit: Payer: Self-pay | Admitting: Oncology

## 2019-06-24 DIAGNOSIS — F419 Anxiety disorder, unspecified: Secondary | ICD-10-CM

## 2019-06-24 DIAGNOSIS — F321 Major depressive disorder, single episode, moderate: Secondary | ICD-10-CM

## 2019-06-28 ENCOUNTER — Other Ambulatory Visit: Payer: Self-pay | Admitting: Oncology

## 2019-06-30 ENCOUNTER — Telehealth: Payer: Self-pay | Admitting: Oncology

## 2019-06-30 NOTE — Telephone Encounter (Signed)
I left a message regarding schedule  

## 2019-07-03 ENCOUNTER — Other Ambulatory Visit: Payer: Self-pay | Admitting: Internal Medicine

## 2019-07-03 DIAGNOSIS — I10 Essential (primary) hypertension: Secondary | ICD-10-CM

## 2019-07-07 ENCOUNTER — Encounter: Payer: Self-pay | Admitting: Internal Medicine

## 2019-07-07 ENCOUNTER — Ambulatory Visit (INDEPENDENT_AMBULATORY_CARE_PROVIDER_SITE_OTHER): Payer: Medicare Other | Admitting: Internal Medicine

## 2019-07-07 ENCOUNTER — Other Ambulatory Visit: Payer: Self-pay

## 2019-07-07 ENCOUNTER — Other Ambulatory Visit (INDEPENDENT_AMBULATORY_CARE_PROVIDER_SITE_OTHER): Payer: Medicare Other

## 2019-07-07 ENCOUNTER — Ambulatory Visit: Payer: Medicare Other | Admitting: Nurse Practitioner

## 2019-07-07 VITALS — BP 134/82 | HR 73 | Temp 98.6°F | Ht 60.0 in | Wt 154.0 lb

## 2019-07-07 DIAGNOSIS — E538 Deficiency of other specified B group vitamins: Secondary | ICD-10-CM

## 2019-07-07 DIAGNOSIS — E781 Pure hyperglyceridemia: Secondary | ICD-10-CM | POA: Diagnosis not present

## 2019-07-07 DIAGNOSIS — J45991 Cough variant asthma: Secondary | ICD-10-CM

## 2019-07-07 DIAGNOSIS — I10 Essential (primary) hypertension: Secondary | ICD-10-CM | POA: Diagnosis not present

## 2019-07-07 DIAGNOSIS — E119 Type 2 diabetes mellitus without complications: Secondary | ICD-10-CM

## 2019-07-07 DIAGNOSIS — Z1159 Encounter for screening for other viral diseases: Secondary | ICD-10-CM | POA: Diagnosis not present

## 2019-07-07 DIAGNOSIS — E559 Vitamin D deficiency, unspecified: Secondary | ICD-10-CM

## 2019-07-07 DIAGNOSIS — E611 Iron deficiency: Secondary | ICD-10-CM

## 2019-07-07 DIAGNOSIS — Z23 Encounter for immunization: Secondary | ICD-10-CM | POA: Diagnosis not present

## 2019-07-07 LAB — VITAMIN B12: Vitamin B-12: 414 pg/mL (ref 211–911)

## 2019-07-07 LAB — BASIC METABOLIC PANEL
BUN: 17 mg/dL (ref 6–23)
CO2: 26 mEq/L (ref 19–32)
Calcium: 10.2 mg/dL (ref 8.4–10.5)
Chloride: 107 mEq/L (ref 96–112)
Creatinine, Ser: 0.82 mg/dL (ref 0.40–1.20)
GFR: 68.37 mL/min (ref >60.00)
Glucose, Bld: 117 mg/dL — ABNORMAL HIGH (ref 70–99)
Potassium: 4.5 mEq/L (ref 3.5–5.1)
Sodium: 142 mEq/L (ref 135–145)

## 2019-07-07 LAB — CBC WITH DIFFERENTIAL/PLATELET
Basophils Absolute: 0.1 10*3/uL (ref 0.0–0.1)
Basophils Relative: 1 % (ref 0.0–3.0)
Eosinophils Absolute: 0.1 10*3/uL (ref 0.0–0.7)
Eosinophils Relative: 1.5 % (ref 0.0–5.0)
HCT: 37.9 % (ref 36.0–46.0)
Hemoglobin: 12.6 g/dL (ref 12.0–15.0)
Lymphocytes Relative: 17.1 % (ref 12.0–46.0)
Lymphs Abs: 1 10*3/uL (ref 0.7–4.0)
MCHC: 33.3 g/dL (ref 30.0–36.0)
MCV: 86.8 fl (ref 78.0–100.0)
Monocytes Absolute: 0.4 10*3/uL (ref 0.1–1.0)
Monocytes Relative: 6.8 % (ref 3.0–12.0)
Neutro Abs: 4.5 10*3/uL (ref 1.4–7.7)
Neutrophils Relative %: 73.6 % (ref 43.0–77.0)
Platelets: 263 10*3/uL (ref 150.0–400.0)
RBC: 4.36 Mil/uL (ref 3.87–5.11)
RDW: 14.2 % (ref 11.5–15.5)
WBC: 6.1 10*3/uL (ref 4.0–10.5)

## 2019-07-07 LAB — URINALYSIS, ROUTINE W REFLEX MICROSCOPIC
Bilirubin Urine: NEGATIVE
Hgb urine dipstick: NEGATIVE
Ketones, ur: NEGATIVE
Leukocytes,Ua: NEGATIVE
Nitrite: NEGATIVE
RBC / HPF: NONE SEEN (ref 0–?)
Specific Gravity, Urine: 1.02 (ref 1.000–1.030)
Total Protein, Urine: NEGATIVE
Urine Glucose: NEGATIVE
Urobilinogen, UA: 0.2 (ref 0.0–1.0)
pH: 6 (ref 5.0–8.0)

## 2019-07-07 LAB — LIPID PANEL
Cholesterol: 126 mg/dL (ref 0–200)
HDL: 40.1 mg/dL (ref >39.00)
LDL Cholesterol: 59 mg/dL (ref 0–99)
NonHDL: 85.89
Total CHOL/HDL Ratio: 3
Triglycerides: 134 mg/dL (ref 0.0–149.0)
VLDL: 26.8 mg/dL (ref 0.0–40.0)

## 2019-07-07 LAB — HEPATIC FUNCTION PANEL
ALT: 17 U/L (ref 0–35)
AST: 15 U/L (ref 0–37)
Albumin: 4.4 g/dL (ref 3.5–5.2)
Alkaline Phosphatase: 65 U/L (ref 39–117)
Bilirubin, Direct: 0.1 mg/dL (ref 0.0–0.3)
Total Bilirubin: 0.5 mg/dL (ref 0.2–1.2)
Total Protein: 7.2 g/dL (ref 6.0–8.3)

## 2019-07-07 LAB — VITAMIN D 25 HYDROXY (VIT D DEFICIENCY, FRACTURES): VITD: 40.06 ng/mL (ref 30.00–100.00)

## 2019-07-07 LAB — TSH: TSH: 2.64 u[IU]/mL (ref 0.35–4.50)

## 2019-07-07 LAB — IBC PANEL
Iron: 107 ug/dL (ref 42–145)
Saturation Ratios: 30.4 % (ref 20.0–50.0)
Transferrin: 251 mg/dL (ref 212.0–360.0)

## 2019-07-07 LAB — HEMOGLOBIN A1C: Hgb A1c MFr Bld: 6.4 % (ref 4.6–6.5)

## 2019-07-07 LAB — MICROALBUMIN / CREATININE URINE RATIO
Creatinine,U: 110.7 mg/dL
Microalb Creat Ratio: 1.7 mg/g (ref 0.0–30.0)
Microalb, Ur: 1.9 mg/dL (ref 0.0–1.9)

## 2019-07-07 MED ORDER — LOSARTAN POTASSIUM 50 MG PO TABS: 50.0000 mg | ORAL_TABLET | Freq: Every day | ORAL | 3 refills | Status: DC

## 2019-07-07 MED ORDER — HYDROCHLOROTHIAZIDE 25 MG PO TABS: 25.0000 mg | ORAL_TABLET | Freq: Every day | ORAL | 3 refills | Status: DC

## 2019-07-07 MED ORDER — FLUTICASONE-SALMETEROL 250-50 MCG/DOSE IN AEPB: 1.0000 | INHALATION_SPRAY | Freq: Two times a day (BID) | RESPIRATORY_TRACT | 11 refills | Status: DC

## 2019-07-07 NOTE — Assessment & Plan Note (Signed)
stable overall by history and exam, recent data reviewed with pt, and pt to continue medical treatment as before,  to f/u any worsening symptoms or concerns  

## 2019-07-07 NOTE — Progress Notes (Signed)
Subjective:    Patient ID: Kristen Howe, female    DOB: 1946/08/10, 73 y.o.   MRN: [id number]  HPI  Here to f/u; overall doing ok,  Pt denies chest pain, increasing orthopnea, PND, increased LE swelling, palpitations, dizziness or syncope.  Pt denies new neurological symptoms such as new headache, or facial or extremity weakness or numbness.  Pt denies polydipsia, polyuria, or low sugar episode.  Pt states overall good compliance with meds, mostly trying to follow appropriate diet, with wt overall stable,  but little exercise however. Cough overall improved, no fever since may 2020 visit, but has intermittent sob and some wheezing on exam today   Pt denies fever, wt loss, night sweats, loss of appetite, or other constitutional symptoms   Past Medical History:  Diagnosis Date  . Allergy   . Anemia    as a child  . Anxiety   . Arthritis    "lower back" (01/25/2016)  . Bilateral breast cancer ([place])    Archie Endo 01/25/2016  . Cataract   . Depression   . Esophageal diverticulum   . GERD (gastroesophageal reflux disease)   . Hyperlipidemia   . Hypertension   . Leg cramps    takes Flexeril  . Migraine    "maybe monthly" (01/25/2016)  . Osteoporosis   . Pneumonia ~ 2005 X 1  . Primary cancer of 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 breast ([place]) 12/14/2015  . Rheumatic fever 1950s   Past Surgical History:  Procedure Laterality Date  . BREAST BIOPSY Bilateral 2016  . CATARACT EXTRACTION W/ INTRAOCULAR LENS  IMPLANT, BILATERAL Bilateral 05/2015-06/2015  . CESAREAN SECTION  1974; 1976  . COLONOSCOPY    . ESOPHAGEAL MANOMETRY N/A 03/10/2017   Procedure: ESOPHAGEAL MANOMETRY (EM);  Surgeon: Ladene Artist, MD;  [facility];  Service: Endoscopy;  Laterality: N/A;  . EYE MUSCLE SURGERY Bilateral 1950s   for cross eyes as a child  . INSERTION OF MESH N/A 09/12/2015   Procedure: INSERTION OF MESH;  Surgeon: Erroll Luna, MD;  Location: [facility];  Service: General;  Laterality: N/A;  .  MASTECTOMY COMPLETE / SIMPLE W/ SENTINEL NODE BIOPSY Right 01/25/2016  . MASTECTOMY MODIFIED RADICAL Left 01/25/2016  . MASTECTOMY W/ SENTINEL NODE BIOPSY Bilateral 01/25/2016   Procedure: RIGHT MASTECTOMY WITH SENTINEL RIGHT LYMPH NODE BIOPSY, LEFT MODIFIED RADICAL MASTECTOMY;  Surgeon: Stark Klein, MD;  Location: [facility];  Service: General;  Laterality: Bilateral;  . TONSILLECTOMY  1950s  . UMBILICAL HERNIA REPAIR N/A 09/12/2015   Procedure: REPAIR OF INCISIONAL AND UMBILICAL HERNIA;  Surgeon: Erroll Luna, MD;  Location: [facility];  Service: General;  Laterality: N/A;    reports that she quit smoking about 3 years ago. Her smoking use included cigarettes. She smoked 1.00 pack per day for 0.00 years. She has never used smokeless tobacco. She reports that she does not drink alcohol or use drugs. family history includes Breast cancer in her cousin, cousin, cousin, cousin, and sister; Cancer in her cousin; Cancer (age of onset: 22) in her maternal grandmother; Diabetes in her father, paternal aunt, and paternal uncle; Gout in her paternal grandfather and paternal grandmother; Healthy in her mother; Heart Problems in her father; Lung cancer in her cousin and maternal uncle; Lupus in her cousin; Lymphoma in her father; Other in her father and mother; Stroke in her maternal grandfather. No Known Allergies Current Outpatient Medications on File Prior to Visit  Medication Sig Dispense Refill  . anastrozole (ARIMIDEX) 1 MG tablet  Take 1 tablet by mouth once daily 90 tablet 0  . Artificial Tear Ointment (DRY EYES OP) Apply 1 drop to eye daily as needed (for dry eyes).     Marland Kitchen atorvastatin (LIPITOR) 10 MG tablet Take 1 tablet (10 mg total) by mouth daily. 90 tablet 0  . Calcium-Phosphorus-Vitamin D (CITRACAL +D3 PO) Take 2 tablets by mouth 2 (two) times daily.     . cyclobenzaprine (FLEXERIL) 10 MG tablet Take 1 tablet (10 mg total) by mouth 3 (three) times daily as needed. for muscle spams 30 tablet 3  . doxycycline  (VIBRA-TABS) 100 MG tablet Take 1 tablet (100 mg total) by mouth 2 (two) times daily. 20 tablet 0  . glucose blood test strip 1 each by Other route daily as needed for other. Use as instructed.  Dx: E11.9 100 each 0  . ibuprofen (ADVIL,MOTRIN) 200 MG tablet Take 400 mg by mouth 2 (two) times daily as needed for moderate pain.    . metFORMIN (GLUCOPHAGE) 500 MG tablet Take 1 tablet (500 mg total) by mouth daily with breakfast. 180 tablet 1  . Multiple Vitamins-Minerals (MULTIVITAMIN WITH MINERALS) tablet Take 1 tablet by mouth daily. Reported on 12/20/2015    . pantoprazole (PROTONIX) 40 MG tablet TAKE 1 TABLET BY MOUTH TWICE DAILY BEFORE MEAL(S) OFFICE  VISIT  NEEDED  FOR  FUTURE  REFILLS 60 tablet 0  . venlafaxine XR (EFFEXOR-XR) 75 MG 24 hr capsule TAKE 1 CAPSULE BY MOUTH ONCE DAILY WITH  BREAKFAST 90 capsule 0   No current facility-administered medications on file prior to visit.    Review of Systems  Constitutional: Negative for other unusual diaphoresis or sweats HENT: Negative for ear discharge or swelling Eyes: Negative for other worsening visual disturbances Respiratory: Negative for stridor or other swelling  Gastrointestinal: Negative for worsening distension or other blood Genitourinary: Negative for retention or other urinary change Musculoskeletal: Negative for other MSK pain or swelling Skin: Negative for color change or other new lesions Neurological: Negative for worsening tremors and other numbness  Psychiatric/Behavioral: Negative for worsening agitation or other fatigue All other system neg per pt    Objective:   Physical Exam BP 134/82   Pulse 73   Temp 98.6 F (37 C) (Oral)   Ht 5' (1.524 m)   Wt 154 lb (69.9 kg)   SpO2 94%   BMI 30.08 kg/m  VS noted,  Constitutional: Pt appears in NAD HENT: Head: NCAT.  Right Ear: External ear normal.  Left Ear: External ear normal.  Eyes: . Pupils are equal, round, and reactive to light. Conjunctivae and EOM are normal  Nose: without d/c or deformity Neck: Neck supple. Gross normal ROM Cardiovascular: Normal rate and regular rhythm.   Pulmonary/Chest: Effort normal and breath sounds decreased without rales but with few trace right > left lung field wheezed  Abd:  Soft, NT, ND, + BS, no organomegaly Neurological: Pt is alert. At baseline orientation, motor grossly intact Skin: Skin is warm. No rashes, other new lesions, no LE edema Psychiatric: Pt behavior is normal without agitation  No other exam findings  Lab Results  Component Value Date   WBC 10.8 (H) 11/04/2018   HGB 13.3 11/04/2018   HCT 40.5 11/04/2018   PLT 308.0 11/04/2018   GLUCOSE 115 (H) 11/04/2018   CHOL 112 07/27/2018   TRIG 144.0 07/27/2018   HDL 37.20 (L) 07/27/2018   LDLDIRECT 137.0 06/17/2018   LDLCALC 46 07/27/2018   ALT 14 11/04/2018   AST  12 11/04/2018   NA 140 11/04/2018   K 3.9 11/04/2018   CL 106 11/04/2018   CREATININE 0.86 11/04/2018   BUN 23 11/04/2018   CO2 25 11/04/2018   HGBA1C 5.7 (A) 12/21/2018       Assessment & Plan:

## 2019-07-07 NOTE — Assessment & Plan Note (Addendum)
New dx, Ok for advair asd,  to f/u any worsening symptoms or concerns  Note:  Total time for pt hx, exam, review of record with pt in the room, determination of diagnoses and plan for further eval and tx is > 40 min, with over 50% spent in coordination and counseling of patient including the differential dx, tx, further evaluation and other management of cough variant asthma, DM, HTN, HLD

## 2019-07-07 NOTE — Patient Instructions (Addendum)
You had the Tdap tetanus shot today, and Prevnar 13 pneumonia shot  You will be contacted regarding the referral for: Diabetes education  Please take all new medication as prescribed - the advair for cough and wheeze  Please continue all other medications as before, and refills have been done if requested.  Please have the pharmacy call with any other refills you may need.  Please continue your efforts at being more active, low cholesterol diet, and weight control.  You are otherwise up to date with prevention measures today.  Please keep your appointments with your specialists as you may have planned  Please go to the LAB in the Basement (turn left off the elevator) for the tests to be done today  You will be contacted by phone if any changes need to be made immediately.  Otherwise, you will receive a letter about your results with an explanation, but please check with MyChart first.  Please remember to sign up for MyChart if you have not done so, as this will be important to you in the future with finding out test results, communicating by private email, and scheduling acute appointments online when needed.  Please return in 6 months, or sooner if needed

## 2019-07-08 LAB — HEPATITIS C ANTIBODY
Hepatitis C Ab: NONREACTIVE
SIGNAL TO CUT-OFF: 0.03 (ref <1.00)

## 2019-08-07 ENCOUNTER — Other Ambulatory Visit: Payer: Self-pay | Admitting: Internal Medicine

## 2019-08-19 ENCOUNTER — Other Ambulatory Visit: Payer: Self-pay

## 2019-08-19 ENCOUNTER — Encounter: Payer: Medicare Other | Attending: Internal Medicine | Admitting: Registered"

## 2019-08-19 ENCOUNTER — Encounter: Payer: Self-pay | Admitting: Registered"

## 2019-08-19 DIAGNOSIS — Z794 Long term (current) use of insulin: Secondary | ICD-10-CM | POA: Insufficient documentation

## 2019-08-19 DIAGNOSIS — E119 Type 2 diabetes mellitus without complications: Secondary | ICD-10-CM | POA: Insufficient documentation

## 2019-08-19 NOTE — Patient Instructions (Addendum)
You have made some good changes in your diet and lifestyle to bring down your blood sugar! Continue with choosing whole grains Continue staying active and getting adequate sleep Use the ideas we talked about today to decide what to eat when you are hungry.  Eat balanced meals and snacks, including a protein when eating a carb. You can continue to check blood sugar 3-4 times week. The two times of day that give the most information is first thing in the morning before you eat breakfast (fasting) and 2 hours after you eat a meal. To keep your A1c at 6.4% your target blood sugar is about 80-125 mg/dL for fasting and less than 170 mg/dL after you eat a meal.

## 2019-08-19 NOTE — Progress Notes (Signed)
Diabetes Self-Management Education  Visit Type: First/Initial  Appt. Start Time: 0830 Appt. End Time: 0930  08/19/2019  Ms. Kristen Howe, identified by name and date of birth, is a 73 y.o. female with a diagnosis of Diabetes: Type 2.   ASSESSMENT  There were no vitals taken for this visit. There is no height or weight on file to calculate BMI.   Pt states she is frustrated because she is hungry but doesn't know what she can eat. Pt states she wants to know what she can eat and how to keep her BG down.  Per chart A1c is 6.4% down from 7.4% last year. Pt reports that she has stopped eating white bread has less pasta and other carbs.  SMBG: Pt states she checks BG before bed time varies from last meal. Pt reports 117 mg/dL last night and ate 1.5 to 2 hrs before checking. Pt reports 153 mg/dL highest value in the last month. Pt reports 2 months ago episode where she felt dizzy with BG of 78 mg/dL and she drank some juice and sat down.  Physical activity: Pt states she is active with ADL such as cleaning, going up and down her stairs and chasing after 3 chihuahuas.  Sleep: 7-8 hrs, most of the time. Feels rested.  Patient states she appreciates a summary report because she has a hard time remembering things.   Diabetes Self-Management Education - 08/19/19 0820      Visit Information   Visit Type  First/Initial      Initial Visit   Diabetes Type  Type 2    Are you currently following a meal plan?  No    Are you taking your medications as prescribed?  Yes   metformin 500 mg   Date Diagnosed  over 1 yr ago      Health Coping   How would you rate your overall health?  Fair      Psychosocial Assessment   Patient Belief/Attitude about Diabetes  Motivated to manage diabetes    How often do you need to have someone help you when you read instructions, pamphlets, or other written materials from your doctor or pharmacy?  1 - Never    What is the last grade level you completed in school?   8      Complications   Last HgB A1C per patient/outside source  6.4 %    How often do you check your blood sugar?  3-4 times / week    Have you had a dilated eye exam in the past 12 months?  No    Have you had a dental exam in the past 12 months?  No    Are you checking your feet?  Yes    How many days per week are you checking your feet?  4      Dietary Intake   Breakfast  roll, butter, coffee w half/half & splenda    Snack (morning)  no    Lunch  salad, berries OR sandwich    Snack (afternoon)  none    Dinner  chicken, brown rice, carrots    Snack (evening)  none    Beverage(s)  water, coffee in the morning      Exercise   Exercise Type  ADL's    How many days per week to you exercise?  0    How many minutes per day do you exercise?  0    Total minutes per week of exercise  0  Patient Education   Previous Diabetes Education  Yes (please comment)   2018 at [facility], but patient indicated no prev education   Disease state   Definition of diabetes, type 1 and 2, and the diagnosis of diabetes    Nutrition management   Role of diet in the treatment of diabetes and the relationship between the three main macronutrients and blood glucose level    Medications  Reviewed patients medication for diabetes, action, purpose, timing of dose and side effects.    Monitoring  Identified appropriate SMBG and/or A1C goals.;Purpose and frequency of SMBG.    Acute complications  Taught treatment of hypoglycemia - the 15 rule.      Individualized Goals (developed by patient)   Nutrition  General guidelines for healthy choices and portions discussed    Monitoring   test my blood glucose as discussed      Outcomes   Expected Outcomes  Demonstrated interest in learning. Expect positive outcomes    Future DMSE  PRN    Program Status  Completed       Individualized Plan for Diabetes Self-Management Training:   Learning Objective:  Patient will have a greater understanding of diabetes  self-management. Patient education plan is to attend individual and/or group sessions per assessed needs and concerns.  Patient Instructions  You have made some good changes in your diet and lifestyle to bring down your blood sugar! Continue with choosing whole grains Continue staying active and getting adequate sleep Use the ideas we talked about today to decide what to eat when you are hungry.  Eat balanced meals and snacks, including a protein when eating a carb. You can continue to check blood sugar 3-4 times week. The two times of day that give the most information is first thing in the morning before you eat breakfast (fasting) and 2 hours after you eat a meal. To keep your A1c at 6.4% your target blood sugar is about 80-125 mg/dL for fasting and less than 170 mg/dL after you eat a meal.   Expected Outcomes:  Demonstrated interest in learning. Expect positive outcomes  Education material provided: Planning Healthy Meals (novo nordisk), Planning Balanced meal graphic worksheet, snack sheet, Breakfast tips  If problems or questions, patient to contact team via:  Phone  Future DSME appointment: PRN

## 2019-09-08 ENCOUNTER — Other Ambulatory Visit: Payer: Self-pay | Admitting: Internal Medicine

## 2019-09-08 DIAGNOSIS — E785 Hyperlipidemia, unspecified: Secondary | ICD-10-CM

## 2019-09-25 ENCOUNTER — Other Ambulatory Visit: Payer: Self-pay | Admitting: Oncology

## 2019-09-25 DIAGNOSIS — F419 Anxiety disorder, unspecified: Secondary | ICD-10-CM

## 2019-09-25 DIAGNOSIS — F321 Major depressive disorder, single episode, moderate: Secondary | ICD-10-CM

## 2019-10-07 ENCOUNTER — Other Ambulatory Visit: Payer: Self-pay | Admitting: Oncology

## 2019-10-29 ENCOUNTER — Other Ambulatory Visit: Payer: Self-pay | Admitting: Oncology

## 2019-11-02 NOTE — Progress Notes (Signed)
[place]  [phone] Fax:([phone]) [phone]     ID: Nalayah Hitt DOB: [birth year]  MR#: [id number]  TWS#:[id number]  Patient Care Team: Biagio Borg, MD as PCP - General (Internal Medicine) Giannina Bartolome, Virgie Dad, MD as Consulting Physician (Oncology) Kyung Rudd, MD as Consulting Physician (Radiation Oncology) Stark Klein, MD as Consulting Physician (General Surgery) OTHER MD:  CHIEF COMPLAINT: bilateral breast cancer (s/p mastectomies)  CURRENT TREATMENT: Anastrozole   INTERVAL HISTORY: Vi returns today for follow-up of her estrogen receptor positive breast cancer.   She continues on anastrozole.  She tolerates this well.  She has never had hot flashes.  She has some vaginal dryness but that is not a major issue for her  Since her last visit, she underwent abdomen ultrasound on 11/10/2018 for intermittent right upper quadrant pain. This showed cholelithiasis and hepatic steatosis.  She also underwent chest x-ray on 05/06/2019 for a 3 week history of cough. This showed no active cardiopulmonary disease.   REVIEW OF SYSTEMS: Vi feels fine.  She tells me she had a pneumonia shot about a month ago.  She stays at home, takes appropriate precautions, occasionally goes out shopping, and cleans her house.  A detailed review of systems today is otherwise entirely stable   BREAST CANCER HISTORY: From the original intake note:  "Vi" herself palpated a mass in her right breast sometime in November 2016. She brought it to Dr. Purcell Nails attention on 11/20/2015 and he set her up for bilateral diagnostic mammography with tomography and I lateral [facility]. The breast density was category B. In the right breast upper outer quadrant there where 2 nodules, at the 9:00 position (which by ultrasound was spiculated and measured 1.7 cm) and at the 11:00 position (which by ultrasound measured 0.7 cm. The right axilla showed a  grossly abnormal lymph node measuring 1.4 cm with complete effacement of the hilum.there were several other small lymph nodes with cortical thickening in the lower right axilla as well.  Biopsy of the right breast mass on 12/12/2015 showed ([id number]) and invasive ductal carcinoma, grade 2, estrogen receptor 100% positive, progesterone receptor 80% positive, both with strong staining intensity, with an MIB-1 of 20%, and HER-2 nonamplified, with a signals ratio of 1.33 and number per cell 3.25.  In the left breast mammographically there was a multifocal mass centered at the 12:00 location with suspicious calcifications. The processes measured 6.7 cm altogether. A dilated duct extended to the nipple from this area, containing microcalcifications. Ultrasonography of the left breast found a multifocal fragmented mass superiorly, the main component being hypoechoic and irregular and tolerable than wide, measuring 2.1 cm.superior to that, there was a second mass measuring 1.9 cm and there were other suspicious solid nodules in the adjacent parenchyma. There was also a separate area of microcalcifications containing a suspicious nodule at the 1:00 position. This measured 0.8 cm. The ultrasound confirmed a dilated duct extending from the superior process to the nipple. An alteration of the left axilla showed a grossly abnormal lymph node measuring up to 1 cm and a second indeterminant lymph node.  Biopsy of the left breast 11:30 o'clock massshowed invasive ductal carcinoma, grade 2, estrogen receptor 100% positive, progesterone receptor 70% positive, with an MIB-1 of 20%, and no HER-2 amplification, the signals ratio being 1.59 and the number per cell 3.90. A second area in the breast described as retroareolar showed ductal carcinoma, possibly in situ. This was also estrogen receptor  positive at 100%, progesterone receptor positive at 60%, with an MIB-1 of 5%, and HER-2 negative, with a signals ratio of 1.73 and  number per cell 3.55. Biopsy of the left breast lymph node was positiveand the prognostic panel there was very similar to all the others, namely estrogen receptor 100% positive, progesterone receptor 40% positive, with an MIB-1 of 40%, and HER-2 no amplification, with a signals ratio of 1.52 and number per cell 3.85.  The patient's subsequent history is as detailed below.   PAST MEDICAL HISTORY: Past Medical History:  Diagnosis Date  . Allergy   . Anemia    as a child  . Anxiety   . Arthritis    "lower back" (01/25/2016)  . Bilateral breast cancer ([facility])    Archie Endo 01/25/2016  . Cataract   . Depression   . Esophageal diverticulum   . GERD (gastroesophageal reflux disease)   . Hyperlipidemia   . Hypertension   . Leg cramps    takes Flexeril  . Migraine    "maybe monthly" (01/25/2016)  . Osteoporosis   . Pneumonia ~ 2005 X 1  . Primary cancer of 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 breast ([facility]) 12/14/2015  . Rheumatic fever 1950s    PAST SURGICAL HISTORY: Past Surgical History:  Procedure Laterality Date  . BREAST BIOPSY Bilateral 2016  . CATARACT EXTRACTION W/ INTRAOCULAR LENS  IMPLANT, BILATERAL Bilateral 05/2015-06/2015  . CESAREAN SECTION  1974; 1976  . COLONOSCOPY    . ESOPHAGEAL MANOMETRY N/A 03/10/2017   Procedure: ESOPHAGEAL MANOMETRY (EM);  Surgeon: Ladene Artist, MD;  Location: [facility];  Service: Endoscopy;  Laterality: N/A;  . EYE MUSCLE SURGERY Bilateral 1950s   for cross eyes as a child  . INSERTION OF MESH N/A 09/12/2015   Procedure: INSERTION OF MESH;  Surgeon: Erroll Luna, MD;  Location: [facility];  Service: General;  Laterality: N/A;  . MASTECTOMY COMPLETE / SIMPLE W/ SENTINEL NODE BIOPSY Right 01/25/2016  . MASTECTOMY MODIFIED RADICAL Left 01/25/2016  . MASTECTOMY W/ SENTINEL NODE BIOPSY Bilateral 01/25/2016   Procedure: RIGHT MASTECTOMY WITH SENTINEL RIGHT LYMPH NODE BIOPSY, LEFT MODIFIED RADICAL MASTECTOMY;  Surgeon: Stark Klein, MD;  Location: [facility];  Service:  General;  Laterality: Bilateral;  . TONSILLECTOMY  1950s  . UMBILICAL HERNIA REPAIR N/A 09/12/2015   Procedure: REPAIR OF INCISIONAL AND UMBILICAL HERNIA;  Surgeon: Erroll Luna, MD;  Location: [facility];  Service: General;  Laterality: N/A;    FAMILY HISTORY Family History  Problem Relation Age of Onset  . Healthy Mother   . Other Mother        +"throat polyps"; +smoker  . Lymphoma Father        d. 12  . Diabetes Father   . Heart Problems Father   . Other Father        "tumors on his kidneys"  . Breast cancer Sister        dx. metachronous bilateral breast cancers at 45 and 9; - BRCA1/2 testing  . Cancer Maternal Grandmother 71       dx. spinal cancer  . Stroke Maternal Grandfather   . Gout Paternal Grandmother   . Gout Paternal Grandfather   . Lung cancer Maternal Uncle        d. 70-72; +smoker  . Diabetes Paternal Aunt   . Diabetes Paternal Uncle   . Breast cancer Cousin        maternal 1st cousin dx. unspecified age  . Lung cancer Cousin  maternal 1st cousin dx. lung cancer; +smoker  . Lupus Cousin   . Breast cancer Cousin        paternal 1st cousin dx. 53s  . Breast cancer Cousin        paternal 1st cousin dx. 50s-30s  . Cancer Cousin        paternal 1st cousin dx. oral cancer with metastasis, also "lost a leg to cancer"; dx. 85s; +EtOH abuse  . Breast cancer Cousin        paternal 1st cousin, once-removed dx. at unspecified age  . Colon cancer Neg Hx   . Esophageal cancer Neg Hx   . Rectal cancer Neg Hx   . Stomach cancer Neg Hx   the patient's father died at the age of 52 from complications of diabetes. He had a history of lymphoma. The patient's mother is currently living at age 34. The patient's brother died in an automobile accident. The patient has a twin sister,  -Animator. She has a history of bilateral breast cancers and did undergo genetic testing in November 2012, with no BRCA1 or 2 mutation found   GYNECOLOGIC HISTORY:  No LMP  recorded. Patient is postmenopausal. Menarche age 63, first live birth age 45. The patient is GX P2. She stopped having periods approximately 1997. She did not take hormone replacement. She took oral contraceptives remotely for approximately 3 years, with no complications.   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 She is divorced and lives at home with her sister Joycelyn Schmid, and with 2 nephews, Rush Landmark, who is currently employed and Corene Cornea, who is disabled. The patient has 3 grandchildren. She is not a church attender    ADVANCED DIRECTIVES: Not in place. At the initial clinic visit  the patient was given the appropriate forms to complete and notarize at her discretion.   HEALTH MAINTENANCE: Social History   Tobacco Use  . Smoking status: Former Smoker    Packs/day: 1.00    Years: 0.00    Pack years: 0.00    Types: Cigarettes    Quit date: 12/24/2015    Years since quitting: 3.8  . Smokeless tobacco: Never Used  Substance Use Topics  . Alcohol use: No    Alcohol/week: 0.0 standard drinks    Comment: 07/31/16 pt states she dosn't drink anymore,04/16/2016 "1/2 bottle wine per month" previously - not much now  . Drug use: No     Colonoscopy: 09/06/2016  PAP: 2010  Bone density: 07/26/2016 showed a T score of -2.5 osteoporosis   Lipid panel:  No Known Allergies  Current Outpatient Medications  Medication Sig Dispense Refill  . anastrozole (ARIMIDEX) 1 MG tablet Take 1 tablet by mouth once daily 90 tablet 0  . Artificial Tear Ointment (DRY EYES OP) Apply 1 drop to eye daily as needed (for dry eyes).     Marland Kitchen atorvastatin (LIPITOR) 10 MG tablet Take 1 tablet by mouth once daily 90 tablet 0  . Calcium-Phosphorus-Vitamin D (CITRACAL +D3 PO) Take 2 tablets by mouth 2 (two) times daily.     . cyclobenzaprine (FLEXERIL) 10 MG tablet Take 1 tablet (10 mg total) by mouth 3 (three) times daily as needed. for muscle spams 30 tablet 3  . doxycycline  (VIBRA-TABS) 100 MG tablet Take 1 tablet (100 mg total) by mouth 2 (two) times daily. 20 tablet 0  . Fluticasone-Salmeterol (ADVAIR) 250-50 MCG/DOSE AEPB Inhale 1 puff into the lungs  2 (two) times a day. 60 each 11  . glucose blood test strip 1 each by Other route daily as needed for other. Use as instructed.  Dx: E11.9 100 each 0  . hydrochlorothiazide (HYDRODIURIL) 25 MG tablet Take 1 tablet (25 mg total) by mouth daily. 90 tablet 3  . ibuprofen (ADVIL,MOTRIN) 200 MG tablet Take 400 mg by mouth 2 (two) times daily as needed for moderate pain.    Marland Kitchen losartan (COZAAR) 50 MG tablet Take 1 tablet (50 mg total) by mouth daily. 90 tablet 3  . metFORMIN (GLUCOPHAGE) 500 MG tablet Take 1 tablet (500 mg total) by mouth daily with breakfast. 180 tablet 1  . Multiple Vitamins-Minerals (MULTIVITAMIN WITH MINERALS) tablet Take 1 tablet by mouth daily. Reported on 12/20/2015    . pantoprazole (PROTONIX) 40 MG tablet TAKE 1 TABLET BY MOUTH TWICE DAILY BEFORE MEAL(S) . 60 tablet 5  . venlafaxine XR (EFFEXOR-XR) 75 MG 24 hr capsule TAKE 1 CAPSULE BY MOUTH ONCE DAILY WITH BREAKFAST 90 capsule 0   No current facility-administered medications for this visit.     OBJECTIVE: Older white woman in no acute distress  Vitals:   11/03/19 0944  BP: 120/75  Pulse: 73  Resp: 17  Temp: 97.8 F (36.6 C)  SpO2: 98%     Body mass index is 29.43 kg/m.    ECOG FS:1 - Symptomatic but completely ambulatory   Sclerae unicteric, EOMs intact Wearing a mask No cervical or supraclavicular adenopathy Lungs no rales or rhonchi Heart regular rate and rhythm Abd soft, nontender, positive bowel sounds MSK no focal spinal tenderness, no upper extremity lymphedema Neuro: nonfocal, well oriented, appropriate affect Breasts: Status post bilateral mastectomies and status post right chest radiation.  No evidence of local recurrence.  Both axillae are benign.     LAB RESULTS:  CMP     Component Value Date/Time   NA 142  07/07/2019 1015   NA 141 11/04/2017 0952   K 4.5 07/07/2019 1015   K 3.9 11/04/2017 0952   CL 107 07/07/2019 1015   CO2 26 07/07/2019 1015   CO2 26 11/04/2017 0952   GLUCOSE 117 (H) 07/07/2019 1015   GLUCOSE 72 11/04/2017 0952   BUN 17 07/07/2019 1015   BUN 20.5 11/04/2017 0952   CREATININE 0.82 07/07/2019 1015   CREATININE 1.01 (H) 11/03/2018 1102   CREATININE 0.9 11/04/2017 0952   CALCIUM 10.2 07/07/2019 1015   CALCIUM 10.7 (H) 11/04/2017 0952   PROT 7.2 07/07/2019 1015   PROT 8.0 11/04/2017 0952   ALBUMIN 4.4 07/07/2019 1015   ALBUMIN 3.9 11/04/2017 0952   AST 15 07/07/2019 1015   AST 13 (L) 11/03/2018 1102   AST 29 11/04/2017 0952   ALT 17 07/07/2019 1015   ALT 18 11/03/2018 1102   ALT 37 11/04/2017 0952   ALKPHOS 65 07/07/2019 1015   ALKPHOS 81 11/04/2017 0952   BILITOT 0.5 07/07/2019 1015   BILITOT 0.5 11/03/2018 1102   BILITOT 0.34 11/04/2017 0952   GFRNONAA 54 (L) 11/03/2018 1102   GFRAA >60 11/03/2018 1102    INo results found for: SPEP, UPEP  Lab Results  Component Value Date   WBC 6.1 07/07/2019   NEUTROABS 4.5 07/07/2019   HGB 12.6 07/07/2019   HCT 37.9 07/07/2019   MCV 86.8 07/07/2019   PLT 263.0 07/07/2019      Chemistry      Component Value Date/Time   NA 142 07/07/2019 1015   NA 141 11/04/2017 0952  K 4.5 07/07/2019 1015   K 3.9 11/04/2017 0952   CL 107 07/07/2019 1015   CO2 26 07/07/2019 1015   CO2 26 11/04/2017 0952   BUN 17 07/07/2019 1015   BUN 20.5 11/04/2017 0952   CREATININE 0.82 07/07/2019 1015   CREATININE 1.01 (H) 11/03/2018 1102   CREATININE 0.9 11/04/2017 0952      Component Value Date/Time   CALCIUM 10.2 07/07/2019 1015   CALCIUM 10.7 (H) 11/04/2017 0952   ALKPHOS 65 07/07/2019 1015   ALKPHOS 81 11/04/2017 0952   AST 15 07/07/2019 1015   AST 13 (L) 11/03/2018 1102   AST 29 11/04/2017 0952   ALT 17 07/07/2019 1015   ALT 18 11/03/2018 1102   ALT 37 11/04/2017 0952   BILITOT 0.5 07/07/2019 1015   BILITOT 0.5  11/03/2018 1102   BILITOT 0.34 11/04/2017 0952       No results found for: LABCA2  No components found for: LABCA125  No results for input(s): INR in the last 168 hours.  Urinalysis    Component Value Date/Time   COLORURINE YELLOW 07/07/2019 1015   APPEARANCEUR CLEAR 07/07/2019 1015   LABSPEC 1.020 07/07/2019 1015   PHURINE 6.0 07/07/2019 1015   GLUCOSEU NEGATIVE 07/07/2019 1015   HGBUR NEGATIVE 07/07/2019 1015   BILIRUBINUR NEGATIVE 07/07/2019 1015   KETONESUR NEGATIVE 07/07/2019 1015   PROTEINUR NEGATIVE 01/17/2016 0910   UROBILINOGEN 0.2 07/07/2019 1015   NITRITE NEGATIVE 07/07/2019 1015   LEUKOCYTESUR NEGATIVE 07/07/2019 1015    STUDIES: No results found.   ASSESSMENT: 73 y.o. BRCA negative [place] woman  (1) status post bilateral breast biopsies and left axillary lymph node biopsy 12/12/2015, showing  (a) on the Right upper-outer quadrant, a clinical mT1c N1, stage IIA, estrogen and progesterone receptor positive, HER-2 not amplified, with an MIB-1 of 20%  (b) on the left upper inner quadrant a clinical mT2 pN1, stage IIB invasive ductal carcinoma, grade 2 over 3, estrogen and progesterone receptor positive, HER-2 not amplified, with MIB-1 between 5 and 40%  (c) chest CT and bone scan 01/04/2016 showed no evidence of metastatic disease. They did suggest a large esophageal diverticulum  (2) Status post right mastectomy with sentinel lymph node sampling and left modified radical mastectomy 01/25/2016  (a) right side: pT2, pN1, stage IIB invasive ductal carcinoma, grade 2, with negative margins  (b) left side: pT3 pN1, stage IIIA invasive ductal carcinoma, grade 2, with negative margins  (c) both cancers were estrogen and progesterone receptor positive, HER-2 nonamplified, with MIB-1 between 5 and 20%   (3) Mammaprint on both breast cancers came back "low risk" predicting a 5 year distant metastasis free survival in the 96% range   (4) no chemotherapy and no  right-sided axillary lymph node dissection recommended at conference 02/21/2016  (5) adjuvant radiation completed 05/06/2016   (6) anastrozole started 06/18/2016  (a) bone density 07/26/2016 shows osteoporosis with a T score of -2.5.  (b)  denosumab/Prolia started 09/04/2016  (7) genetics testing 04/26/2016 through the [facility] through [employer], MD) found no deleterious mutations in ATM, BARD1, BRCA1, BRCA2, BRIP1, CDH1, CHEK2, FANCC, MLH1, MSH2, MSH6, NBN, PALB2, PMS2, PTEN, RAD51C, RAD51D, TP53, and XRCC2. This panel also includes deletion/duplication analysis (without sequencing) for one gene, EPCAM.  PLAN:  Cathey is now nearly 4 years out from definitive surgery for breast cancer with no evidence of disease recurrence.  This is very favorable.  We are holding off on her Prolia on until we get  a bone density, which will be before her visit here next year.  She refused a flu shot today.  She is tolerating anastrozole well and the plan is to continue that a total of 5 years  She is taking appropriate pandemic precautions  She knows to call for any other issue that may develop before the next visit.   Virgie Dad. Inmer Nix, MD 11/03/19 10:06 AM Medical Oncology and Hematology [facility] [place], [place] [address] Tel. [phone]    Fax. [phone]   I, Wilburn Mylar, am acting as scribe for Dr. Virgie Dad. Celeste Candelas.  I, Lurline Del MD, have reviewed the above documentation for accuracy and completeness, and I agree with the above.

## 2019-11-03 ENCOUNTER — Inpatient Hospital Stay: Payer: Medicare Other | Attending: Hematology and Oncology | Admitting: Oncology

## 2019-11-03 ENCOUNTER — Other Ambulatory Visit: Payer: Self-pay

## 2019-11-03 ENCOUNTER — Inpatient Hospital Stay: Payer: Medicare Other

## 2019-11-03 ENCOUNTER — Telehealth: Payer: Self-pay | Admitting: Oncology

## 2019-11-03 VITALS — BP 120/75 | HR 73 | Temp 97.8°F | Resp 17 | Ht 60.0 in | Wt 150.7 lb

## 2019-11-03 DIAGNOSIS — M62838 Other muscle spasm: Secondary | ICD-10-CM | POA: Diagnosis not present

## 2019-11-03 DIAGNOSIS — C773 Secondary and unspecified malignant neoplasm of axilla and upper limb lymph nodes: Secondary | ICD-10-CM | POA: Diagnosis not present

## 2019-11-03 DIAGNOSIS — C50411 Malignant neoplasm of upper-outer quadrant of right female breast: Secondary | ICD-10-CM | POA: Insufficient documentation

## 2019-11-03 DIAGNOSIS — Z87891 Personal history of nicotine dependence: Secondary | ICD-10-CM | POA: Diagnosis not present

## 2019-11-03 DIAGNOSIS — F419 Anxiety disorder, unspecified: Secondary | ICD-10-CM

## 2019-11-03 DIAGNOSIS — M810 Age-related osteoporosis without current pathological fracture: Secondary | ICD-10-CM | POA: Diagnosis not present

## 2019-11-03 DIAGNOSIS — F321 Major depressive disorder, single episode, moderate: Secondary | ICD-10-CM | POA: Diagnosis not present

## 2019-11-03 DIAGNOSIS — E119 Type 2 diabetes mellitus without complications: Secondary | ICD-10-CM | POA: Diagnosis not present

## 2019-11-03 DIAGNOSIS — C50212 Malignant neoplasm of upper-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 breast: Secondary | ICD-10-CM | POA: Diagnosis not present

## 2019-11-03 DIAGNOSIS — Z170 Estrogen receptor positive status [ER+]: Secondary | ICD-10-CM | POA: Diagnosis not present

## 2019-11-03 MED ORDER — ANASTROZOLE 1 MG PO TABS: 1.0000 mg | ORAL_TABLET | Freq: Every day | ORAL | 4 refills | Status: DC

## 2019-11-03 MED ORDER — VENLAFAXINE HCL ER 75 MG PO CP24: 75.0000 mg | ORAL_CAPSULE | Freq: Every day | ORAL | 4 refills | Status: DC

## 2019-11-03 MED ORDER — CYCLOBENZAPRINE HCL 10 MG PO TABS: 10.0000 mg | ORAL_TABLET | Freq: Three times a day (TID) | ORAL | 3 refills | Status: DC | PRN

## 2019-11-03 MED ORDER — GLUCOSE BLOOD VI STRP: 1.0000 | ORAL_STRIP | Freq: Every day | 0 refills | Status: DC | PRN

## 2019-11-03 NOTE — Telephone Encounter (Signed)
I talk with patient regarding schedule  

## 2019-11-05 ENCOUNTER — Ambulatory Visit: Payer: Medicare Other | Admitting: Hematology and Oncology

## 2019-11-08 ENCOUNTER — Other Ambulatory Visit: Payer: Self-pay | Admitting: Internal Medicine

## 2019-11-08 DIAGNOSIS — E119 Type 2 diabetes mellitus without complications: Secondary | ICD-10-CM

## 2019-11-08 NOTE — Telephone Encounter (Signed)
Copied from [place] [id number]. Topic: Quick Communication - Rx Refill/Question >> Nov 08, 2019 11:42 AM Rainey Pines A wrote: Medication: glucose blood test strip   Has the patient contacted their pharmacy? Yes (Agent: If no, request that the patient contact the pharmacy for the refill.) (Agent: If yes, when and what did the pharmacy advise?)Contact Pcp  Preferred Pharmacy (with phone number or street name): [place], [place] - [id number] [phone] (Phone) [phone] (Fax)    Agent: Please be advised that RX refills may take up to 3 business days. We ask that you follow-up with your pharmacy.

## 2019-12-03 ENCOUNTER — Other Ambulatory Visit: Payer: Self-pay | Admitting: Internal Medicine

## 2019-12-03 DIAGNOSIS — E785 Hyperlipidemia, unspecified: Secondary | ICD-10-CM

## 2020-01-03 ENCOUNTER — Telehealth: Payer: Self-pay | Admitting: Internal Medicine

## 2020-01-03 DIAGNOSIS — E119 Type 2 diabetes mellitus without complications: Secondary | ICD-10-CM

## 2020-01-03 NOTE — Telephone Encounter (Signed)
Pt called saying [facility] on [address] told her on Saturday that they never received the refill order on her Metformin.  Pt is almost out and needs the refill asap.  I told her that it looked like the Metformin Rx refill was sen to the pharmacy on 1/6/.  CB#  [phone]

## 2020-01-04 MED ORDER — METFORMIN HCL 500 MG PO TABS: 500.0000 mg | ORAL_TABLET | Freq: Every day | ORAL | 0 refills | Status: DC

## 2020-01-04 NOTE — Telephone Encounter (Signed)
Refill sent. See meds. OV is due.

## 2020-01-11 ENCOUNTER — Encounter: Payer: Self-pay | Admitting: Internal Medicine

## 2020-01-11 ENCOUNTER — Other Ambulatory Visit: Payer: Self-pay

## 2020-01-11 ENCOUNTER — Ambulatory Visit (INDEPENDENT_AMBULATORY_CARE_PROVIDER_SITE_OTHER): Payer: Medicare Other | Admitting: Internal Medicine

## 2020-01-11 VITALS — BP 120/78 | HR 82 | Temp 98.4°F | Ht 60.0 in | Wt 150.1 lb

## 2020-01-11 DIAGNOSIS — I10 Essential (primary) hypertension: Secondary | ICD-10-CM | POA: Diagnosis not present

## 2020-01-11 DIAGNOSIS — E781 Pure hyperglyceridemia: Secondary | ICD-10-CM | POA: Diagnosis not present

## 2020-01-11 DIAGNOSIS — J45991 Cough variant asthma: Secondary | ICD-10-CM | POA: Diagnosis not present

## 2020-01-11 DIAGNOSIS — E119 Type 2 diabetes mellitus without complications: Secondary | ICD-10-CM | POA: Diagnosis not present

## 2020-01-11 LAB — HEPATIC FUNCTION PANEL
ALT: 20 U/L (ref 0–35)
AST: 16 U/L (ref 0–37)
Albumin: 4.3 g/dL (ref 3.5–5.2)
Alkaline Phosphatase: 82 U/L (ref 39–117)
Bilirubin, Direct: 0.1 mg/dL (ref 0.0–0.3)
Total Bilirubin: 0.4 mg/dL (ref 0.2–1.2)
Total Protein: 7.2 g/dL (ref 6.0–8.3)

## 2020-01-11 LAB — HEMOGLOBIN A1C: Hgb A1c MFr Bld: 6.3 % (ref 4.6–6.5)

## 2020-01-11 LAB — BASIC METABOLIC PANEL
BUN: 23 mg/dL (ref 6–23)
CO2: 29 mEq/L (ref 19–32)
Calcium: 10.1 mg/dL (ref 8.4–10.5)
Chloride: 103 mEq/L (ref 96–112)
Creatinine, Ser: 0.81 mg/dL (ref 0.40–1.20)
GFR: 69.24 mL/min (ref >60.00)
Glucose, Bld: 115 mg/dL — ABNORMAL HIGH (ref 70–99)
Potassium: 4 mEq/L (ref 3.5–5.1)
Sodium: 139 mEq/L (ref 135–145)

## 2020-01-11 LAB — LIPID PANEL
Cholesterol: 132 mg/dL (ref 0–200)
HDL: 48.2 mg/dL (ref >39.00)
LDL Cholesterol: 61 mg/dL (ref 0–99)
NonHDL: 83.87
Total CHOL/HDL Ratio: 3
Triglycerides: 114 mg/dL (ref 0.0–149.0)
VLDL: 22.8 mg/dL (ref 0.0–40.0)

## 2020-01-11 NOTE — Assessment & Plan Note (Signed)
stable overall by history and exam, recent data reviewed with pt, and pt to continue medical treatment as before,  to f/u any worsening symptoms or concerns, for f/u lab 

## 2020-01-11 NOTE — Patient Instructions (Signed)

## 2020-01-11 NOTE — Assessment & Plan Note (Addendum)
stable overall by history and exam, recent data reviewed with pt, and pt to continue medical treatment as before,  to f/u any worsening symptoms or concerns  

## 2020-01-11 NOTE — Assessment & Plan Note (Addendum)
stable overall by history and exam, recent data reviewed with pt, and pt to continue medical treatment as before,  to f/u any worsening symptoms or concerns, for f/u lab  I spent 32 minutes preparing to see the patient by review of recent labs, imaging and procedures, obtaining and reviewing separately obtained history, communicating with the patient and family or caregiver, ordering medications, tests or procedures, and documenting clinical information in the EHR including the differential Dx, treatment, and any further evaluation and other management of DM, HLD, HTN, asthma

## 2020-01-11 NOTE — Progress Notes (Signed)
Subjective:    Patient ID: Kristen Howe, female    DOB: 12-Aug-1946, 74 y.o.   MRN: [id number]  HPI  Here to f/u; overall doing ok,  Pt denies chest pain, increasing sob or doe, wheezing, orthopnea, PND, increased LE swelling, palpitations, dizziness or syncope.  Pt denies new neurological symptoms such as new headache, or facial or extremity weakness or numbness.  Pt denies polydipsia, polyuria, or low sugar episode.  Pt states overall good compliance with meds, mostly trying to follow appropriate diet, with wt overall stable,  but little exercise however.  Due for eye exam Past Medical History:  Diagnosis Date  . Allergy   . Anemia    as a child  . Anxiety   . Arthritis    "lower back" (01/25/2016)  . Bilateral breast cancer ([place])    [facility] Endo 01/25/2016  . Cataract   . Depression   . Esophageal diverticulum   . GERD (gastroesophageal reflux disease)   . Hyperlipidemia   . Hypertension   . Leg cramps    takes Flexeril  . Migraine    "maybe monthly" (01/25/2016)  . Osteoporosis   . Pneumonia ~ 2005 X 1  . Primary cancer of 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 breast ([place]) 12/14/2015  . Rheumatic fever 1950s   Past Surgical History:  Procedure Laterality Date  . BREAST BIOPSY Bilateral 2016  . CATARACT EXTRACTION W/ INTRAOCULAR LENS  IMPLANT, BILATERAL Bilateral 05/2015-06/2015  . CESAREAN SECTION  1974; 1976  . COLONOSCOPY    . ESOPHAGEAL MANOMETRY N/A 03/10/2017   Procedure: ESOPHAGEAL MANOMETRY (EM);  Surgeon: Ladene Artist, MD;  Location: [facility];  Service: Endoscopy;  Laterality: N/A;  . EYE MUSCLE SURGERY Bilateral 1950s   for cross eyes as a child  . INSERTION OF MESH N/A 09/12/2015   Procedure: INSERTION OF MESH;  Surgeon: Erroll Luna, MD;  Location: [facility];  Service: General;  Laterality: N/A;  . MASTECTOMY COMPLETE / SIMPLE W/ SENTINEL NODE BIOPSY Right 01/25/2016  . MASTECTOMY MODIFIED RADICAL Left 01/25/2016  . MASTECTOMY W/ SENTINEL NODE BIOPSY Bilateral 01/25/2016   Procedure: RIGHT MASTECTOMY WITH SENTINEL RIGHT LYMPH NODE BIOPSY, LEFT MODIFIED RADICAL MASTECTOMY;  Surgeon: Stark Klein, MD;  Location: [facility];  Service: General;  Laterality: Bilateral;  . TONSILLECTOMY  1950s  . UMBILICAL HERNIA REPAIR N/A 09/12/2015   Procedure: REPAIR OF INCISIONAL AND UMBILICAL HERNIA;  Surgeon: Erroll Luna, MD;  Location: [facility];  Service: General;  Laterality: N/A;    reports that she quit smoking about 4 years ago. Her smoking use included cigarettes. She smoked 1.00 pack per day for 0.00 years. She has never used smokeless tobacco. She reports that she does not drink alcohol or use drugs. family history includes Breast cancer in her cousin, cousin, cousin, cousin, and sister; Cancer in her cousin; Cancer (age of onset: 17) in her maternal grandmother; Diabetes in her father, paternal aunt, and paternal uncle; Gout in her paternal grandfather and paternal grandmother; Healthy in her mother; Heart Problems in her father; Lung cancer in her cousin and maternal uncle; Lupus in her cousin; Lymphoma in her father; Other in her father and mother; Stroke in her maternal grandfather. No Known Allergies Current Outpatient Medications on File Prior to Visit  Medication Sig Dispense Refill  . anastrozole (ARIMIDEX) 1 MG tablet Take 1 tablet (1 mg total) by mouth daily. 90 tablet 4  . Artificial Tear Ointment (DRY EYES OP) Apply 1 drop to eye daily as needed (for  dry eyes).     Marland Kitchen atorvastatin (LIPITOR) 10 MG tablet Take 1 tablet by mouth once daily 90 tablet 0  . Calcium-Phosphorus-Vitamin D (CITRACAL +D3 PO) Take 2 tablets by mouth 2 (two) times daily.     . cyclobenzaprine (FLEXERIL) 10 MG tablet Take 1 tablet (10 mg total) by mouth 3 (three) times daily as needed. for muscle spams 30 tablet 3  . Fluticasone-Salmeterol (ADVAIR) 250-50 MCG/DOSE AEPB Inhale 1 puff into the lungs 2 (two) times a day. 60 each 11  . glucose blood test strip 1 each by Other route daily as needed for  other. Use as instructed.  Dx: E11.9 100 each 0  . hydrochlorothiazide (HYDRODIURIL) 25 MG tablet Take 1 tablet (25 mg total) by mouth daily. 90 tablet 3  . ibuprofen (ADVIL,MOTRIN) 200 MG tablet Take 400 mg by mouth 2 (two) times daily as needed for moderate pain.    Marland Kitchen losartan (COZAAR) 50 MG tablet Take 1 tablet (50 mg total) by mouth daily. 90 tablet 3  . metFORMIN (GLUCOPHAGE) 500 MG tablet Take 1 tablet (500 mg total) by mouth daily with breakfast. **OFFICE VISIT IS DUE** 180 tablet 0  . Multiple Vitamins-Minerals (MULTIVITAMIN WITH MINERALS) tablet Take 1 tablet by mouth daily. Reported on 12/20/2015    . pantoprazole (PROTONIX) 40 MG tablet TAKE 1 TABLET BY MOUTH TWICE DAILY BEFORE MEAL(S) . 60 tablet 5  . venlafaxine XR (EFFEXOR-XR) 75 MG 24 hr capsule Take 1 capsule (75 mg total) by mouth daily with breakfast. 90 capsule 4  . doxycycline (VIBRA-TABS) 100 MG tablet Take 1 tablet (100 mg total) by mouth 2 (two) times daily. (Patient not taking: Reported on 01/11/2020) 20 tablet 0   No current facility-administered medications on file prior to visit.   Review of Systems All otherwise neg per pt     Objective:   Physical Exam BP 120/78 (BP Location: Left Arm, Patient Position: Sitting, Cuff Size: Normal)   Pulse 82   Temp 98.4 F (36.9 C) (Oral)   Ht 5' (1.524 m)   Wt 150 lb 2 oz (68.1 kg)   SpO2 96%   BMI 29.32 kg/m  VS noted,  Constitutional: Pt appears in NAD HENT: Head: NCAT.  Right Ear: External ear normal.  Left Ear: External ear normal.  Eyes: . Pupils are equal, round, and reactive to light. Conjunctivae and EOM are normal Nose: without d/c or deformity Neck: Neck supple. Gross normal ROM Cardiovascular: Normal rate and regular rhythm.   Pulmonary/Chest: Effort normal and breath sounds without rales or wheezing.  Abd:  Soft, NT, ND, + BS, no organomegaly Neurological: Pt is alert. At baseline orientation, motor grossly intact Skin: Skin is warm. No rashes, other new  lesions, no LE edema Psychiatric: Pt behavior is normal without agitation  All otherwise neg per pt Lab Results  Component Value Date   WBC 6.1 07/07/2019   HGB 12.6 07/07/2019   HCT 37.9 07/07/2019   PLT 263.0 07/07/2019   GLUCOSE 117 (H) 07/07/2019   CHOL 126 07/07/2019   TRIG 134.0 07/07/2019   HDL 40.10 07/07/2019   LDLDIRECT 137.0 06/17/2018   LDLCALC 59 07/07/2019   ALT 17 07/07/2019   AST 15 07/07/2019   NA 142 07/07/2019   K 4.5 07/07/2019   CL 107 07/07/2019   CREATININE 0.82 07/07/2019   BUN 17 07/07/2019   CO2 26 07/07/2019   TSH 2.64 07/07/2019   HGBA1C 6.4 07/07/2019   MICROALBUR 1.9 07/07/2019  Assessment & Plan:

## 2020-01-20 ENCOUNTER — Encounter: Payer: Self-pay | Admitting: Internal Medicine

## 2020-01-25 DIAGNOSIS — E119 Type 2 diabetes mellitus without complications: Secondary | ICD-10-CM | POA: Diagnosis not present

## 2020-01-25 LAB — HM DIABETES EYE EXAM

## 2020-03-25 ENCOUNTER — Other Ambulatory Visit: Payer: Self-pay | Admitting: Internal Medicine

## 2020-03-25 DIAGNOSIS — E785 Hyperlipidemia, unspecified: Secondary | ICD-10-CM

## 2020-03-26 NOTE — Telephone Encounter (Signed)
Please refill as per office routine med refill policy (all routine meds refilled for 3 mo or monthly per pt preference up to one year from last visit, then month to month grace period for 3 mo, then further med refills will have to be denied)  

## 2020-06-16 ENCOUNTER — Other Ambulatory Visit: Payer: Self-pay | Admitting: Internal Medicine

## 2020-06-20 DIAGNOSIS — H26493 Other secondary cataract, bilateral: Secondary | ICD-10-CM | POA: Diagnosis not present

## 2020-06-20 DIAGNOSIS — E119 Type 2 diabetes mellitus without complications: Secondary | ICD-10-CM | POA: Diagnosis not present

## 2020-07-07 ENCOUNTER — Other Ambulatory Visit: Payer: Self-pay | Admitting: Internal Medicine

## 2020-07-07 DIAGNOSIS — E119 Type 2 diabetes mellitus without complications: Secondary | ICD-10-CM

## 2020-07-07 NOTE — Telephone Encounter (Signed)
Please refill as per office routine med refill policy (all routine meds refilled for 3 mo or monthly per pt preference up to one year from last visit, then month to month grace period for 3 mo, then further med refills will have to be denied)  

## 2020-07-28 ENCOUNTER — Other Ambulatory Visit: Payer: Self-pay

## 2020-07-28 ENCOUNTER — Ambulatory Visit
Admission: RE | Admit: 2020-07-28 | Discharge: 2020-07-28 | Disposition: A | Discharge status: Home / Self Care | Payer: Medicare Other | Source: Ambulatory Visit | Attending: Oncology | Admitting: Oncology

## 2020-07-28 DIAGNOSIS — C50212 Malignant neoplasm of upper-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 breast: Secondary | ICD-10-CM

## 2020-07-28 DIAGNOSIS — F419 Anxiety disorder, unspecified: Secondary | ICD-10-CM

## 2020-07-28 DIAGNOSIS — M62838 Other muscle spasm: Secondary | ICD-10-CM

## 2020-07-28 DIAGNOSIS — M8589 Other specified disorders of bone density and structure, multiple sites: Secondary | ICD-10-CM | POA: Diagnosis not present

## 2020-07-28 DIAGNOSIS — Z170 Estrogen receptor positive status [ER+]: Secondary | ICD-10-CM

## 2020-07-28 DIAGNOSIS — F321 Major depressive disorder, single episode, moderate: Secondary | ICD-10-CM

## 2020-07-28 DIAGNOSIS — E119 Type 2 diabetes mellitus without complications: Secondary | ICD-10-CM

## 2020-07-28 DIAGNOSIS — Z780 Asymptomatic menopausal state: Secondary | ICD-10-CM | POA: Diagnosis not present

## 2020-07-31 ENCOUNTER — Telehealth: Payer: Self-pay

## 2020-07-31 NOTE — Telephone Encounter (Signed)
TC to pt per Wilber Bihari NP to let her know that her Bone density shows improvement. Now she has osteopenia. Pt verbalized understanding and appreciated the call. No further problems or concerns at this time.

## 2020-08-03 ENCOUNTER — Ambulatory Visit: Payer: Medicare Other | Admitting: Internal Medicine

## 2020-08-03 ENCOUNTER — Other Ambulatory Visit: Payer: Self-pay

## 2020-08-03 ENCOUNTER — Encounter: Payer: Self-pay | Admitting: Internal Medicine

## 2020-08-03 ENCOUNTER — Ambulatory Visit (INDEPENDENT_AMBULATORY_CARE_PROVIDER_SITE_OTHER): Payer: Medicare Other | Admitting: Internal Medicine

## 2020-08-03 ENCOUNTER — Ambulatory Visit (INDEPENDENT_AMBULATORY_CARE_PROVIDER_SITE_OTHER): Payer: Medicare Other

## 2020-08-03 VITALS — BP 118/70 | HR 73 | Temp 98.3°F | Ht 60.0 in | Wt 150.0 lb

## 2020-08-03 DIAGNOSIS — E119 Type 2 diabetes mellitus without complications: Secondary | ICD-10-CM

## 2020-08-03 DIAGNOSIS — E559 Vitamin D deficiency, unspecified: Secondary | ICD-10-CM | POA: Diagnosis not present

## 2020-08-03 DIAGNOSIS — J45991 Cough variant asthma: Secondary | ICD-10-CM | POA: Diagnosis not present

## 2020-08-03 DIAGNOSIS — I10 Essential (primary) hypertension: Secondary | ICD-10-CM

## 2020-08-03 DIAGNOSIS — R1011 Right upper quadrant pain: Secondary | ICD-10-CM | POA: Diagnosis not present

## 2020-08-03 DIAGNOSIS — Z86010 Personal history of colonic polyps: Secondary | ICD-10-CM | POA: Insufficient documentation

## 2020-08-03 DIAGNOSIS — R0600 Dyspnea, unspecified: Secondary | ICD-10-CM | POA: Diagnosis not present

## 2020-08-03 DIAGNOSIS — E538 Deficiency of other specified B group vitamins: Secondary | ICD-10-CM | POA: Diagnosis not present

## 2020-08-03 LAB — COMPLETE METABOLIC PANEL WITH GFR
AG Ratio: 1.7 (calc) (ref 1.0–2.5)
ALT: 20 U/L (ref 6–29)
AST: 18 U/L (ref 10–35)
Albumin: 4.6 g/dL (ref 3.6–5.1)
Alkaline phosphatase (APISO): 79 U/L (ref 37–153)
BUN: 19 mg/dL (ref 7–25)
CO2: 30 mmol/L (ref 20–32)
Calcium: 10.9 mg/dL — ABNORMAL HIGH (ref 8.6–10.4)
Chloride: 103 mmol/L (ref 98–110)
Creat: 0.89 mg/dL (ref 0.60–0.93)
GFR, Est African American: 75 mL/min/{1.73_m2} (ref >=60)
GFR, Est Non African American: 64 mL/min/{1.73_m2} (ref >=60)
Globulin: 2.7 g/dL (calc) (ref 1.9–3.7)
Glucose, Bld: 96 mg/dL (ref 65–99)
Potassium: 4.2 mmol/L (ref 3.5–5.3)
Sodium: 141 mmol/L (ref 135–146)
Total Bilirubin: 0.5 mg/dL (ref 0.2–1.2)
Total Protein: 7.3 g/dL (ref 6.1–8.1)

## 2020-08-03 MED ORDER — PREDNISONE 10 MG PO TABS: ORAL_TABLET | ORAL | 0 refills | Status: DC

## 2020-08-03 MED ORDER — FLUTICASONE-SALMETEROL 250-50 MCG/DOSE IN AEPB: 1.0000 | INHALATION_SPRAY | Freq: Two times a day (BID) | RESPIRATORY_TRACT | 11 refills | Status: DC

## 2020-08-03 MED ORDER — ALBUTEROL SULFATE HFA 108 (90 BASE) MCG/ACT IN AERS
2.0000 | INHALATION_SPRAY | Freq: Four times a day (QID) | RESPIRATORY_TRACT | 11 refills | Status: DC | PRN
Start: 2020-08-03 — End: 2022-05-28

## 2020-08-03 NOTE — Progress Notes (Signed)
Subjective:    Patient ID: Kristen Howe, female    DOB: 1946-08-24, 74 y.o.   MRN: [id number]  HPI  Here to f/u; overall doing ok,  Pt denies chest pain, increasing sob or doe, wheezing, orthopnea, PND, increased LE swelling, palpitations, dizziness or syncope, though has ongoing cough and Does have several wks ongoing nasal allergy symptoms with clearish congestion, itch and sneezing, without fever, pain, ST, cough, swelling or wheezing. .  Pt denies new neurological symptoms such as new headache, or facial or extremity weakness or numbness.  Pt denies polydipsia, polyuria, or low sugar episode.  Pt states overall good compliance with meds.  Denies worsening reflux, abd pain, dysphagia, n/v, bowel change or blood, except for mild intermittent month of ruq pain with eating without radiation or fever.   Past Medical History:  Diagnosis Date  . Allergy   . Anemia    as a child  . Anxiety   . Arthritis    "lower back" (01/25/2016)  . Bilateral breast cancer ([place])    [facility] Endo 01/25/2016  . Cataract   . Depression   . Esophageal diverticulum   . GERD (gastroesophageal reflux disease)   . Hyperlipidemia   . Hypertension   . Leg cramps    takes Flexeril  . Migraine    "maybe monthly" (01/25/2016)  . Osteoporosis   . Pneumonia ~ 2005 X 1  . Primary cancer of 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 breast ([place]) 12/14/2015  . Rheumatic fever 1950s   Past Surgical History:  Procedure Laterality Date  . BREAST BIOPSY Bilateral 2016  . CATARACT EXTRACTION W/ INTRAOCULAR LENS  IMPLANT, BILATERAL Bilateral 05/2015-06/2015  . CESAREAN SECTION  1974; 1976  . COLONOSCOPY    . ESOPHAGEAL MANOMETRY N/A 03/10/2017   Procedure: ESOPHAGEAL MANOMETRY (EM);  Surgeon: Ladene Artist, MD;  Location: [facility];  Service: Endoscopy;  Laterality: N/A;  . EYE MUSCLE SURGERY Bilateral 1950s   for cross eyes as a child  . INSERTION OF MESH N/A 09/12/2015   Procedure: INSERTION OF MESH;  Surgeon: Erroll Luna, MD;   Location: [facility];  Service: General;  Laterality: N/A;  . MASTECTOMY COMPLETE / SIMPLE W/ SENTINEL NODE BIOPSY Right 01/25/2016  . MASTECTOMY MODIFIED RADICAL Left 01/25/2016  . MASTECTOMY W/ SENTINEL NODE BIOPSY Bilateral 01/25/2016   Procedure: RIGHT MASTECTOMY WITH SENTINEL RIGHT LYMPH NODE BIOPSY, LEFT MODIFIED RADICAL MASTECTOMY;  Surgeon: Stark Klein, MD;  Location: [facility];  Service: General;  Laterality: Bilateral;  . TONSILLECTOMY  1950s  . UMBILICAL HERNIA REPAIR N/A 09/12/2015   Procedure: REPAIR OF INCISIONAL AND UMBILICAL HERNIA;  Surgeon: Erroll Luna, MD;  Location: [facility];  Service: General;  Laterality: N/A;    reports that she quit smoking about 4 years ago. Her smoking use included cigarettes. She smoked 1.00 pack per day for 0.00 years. She has never used smokeless tobacco. She reports that she does not drink alcohol and does not use drugs. family history includes Breast cancer in her cousin, cousin, cousin, cousin, and sister; Cancer in her cousin; Cancer (age of onset: 48) in her maternal grandmother; Diabetes in her father, paternal aunt, and paternal uncle; Gout in her paternal grandfather and paternal grandmother; Healthy in her mother; Heart Problems in her father; Lung cancer in her cousin and maternal uncle; Lupus in her cousin; Lymphoma in her father; Other in her father and mother; Stroke in her maternal grandfather. No Known Allergies Current Outpatient Medications on File Prior to Visit  Medication  Sig Dispense Refill  . anastrozole (ARIMIDEX) 1 MG tablet Take 1 tablet (1 mg total) by mouth daily. 90 tablet 4  . Artificial Tear Ointment (DRY EYES OP) Apply 1 drop to eye daily as needed (for dry eyes).     Marland Kitchen atorvastatin (LIPITOR) 10 MG tablet Take 1 tablet by mouth once daily 90 tablet 2  . Calcium-Phosphorus-Vitamin D (CITRACAL +D3 PO) Take 2 tablets by mouth 2 (two) times daily.     . cyclobenzaprine (FLEXERIL) 10 MG tablet Take 1 tablet (10 mg total) by mouth 3 (three)  times daily as needed. for muscle spams 30 tablet 3  . glucose blood test strip 1 each by Other route daily as needed for other. Use as instructed.  Dx: E11.9 100 each 0  . hydrochlorothiazide (HYDRODIURIL) 25 MG tablet Take 1 tablet (25 mg total) by mouth daily. 90 tablet 3  . ibuprofen (ADVIL,MOTRIN) 200 MG tablet Take 400 mg by mouth 2 (two) times daily as needed for moderate pain.    Marland Kitchen losartan (COZAAR) 50 MG tablet Take 1 tablet (50 mg total) by mouth daily. 90 tablet 3  . metFORMIN (GLUCOPHAGE) 500 MG tablet TAKE 1 TABLET BY MOUTH DAILY WITH BREAKFAST . APPOINTMENT REQUIRED FOR FUTURE REFILLS 180 tablet 0  . Multiple Vitamins-Minerals (MULTIVITAMIN WITH MINERALS) tablet Take 1 tablet by mouth daily. Reported on 12/20/2015    . pantoprazole (PROTONIX) 40 MG tablet TAKE 1 TABLET BY MOUTH TWICE DAILY BEFORE MEAL(S) 60 tablet 11  . venlafaxine XR (EFFEXOR-XR) 75 MG 24 hr capsule Take 1 capsule (75 mg total) by mouth daily with breakfast. 90 capsule 4   No current facility-administered medications on file prior to visit.   Review of Systems All otherwise neg per pt    Objective:   Physical Exam BP 118/70 (BP Location: Left Arm, Patient Position: Sitting, Cuff Size: Large)   Pulse 73   Temp 98.3 F (36.8 C) (Oral)   Ht 5' (1.524 m)   Wt 150 lb (68 kg)   SpO2 93%   BMI 29.29 kg/m  VS noted,  Constitutional: Pt appears in NAD HENT: Head: NCAT.  Right Ear: External ear normal.  Left Ear: External ear normal.  Eyes: . Pupils are equal, round, and reactive to light. Conjunctivae and EOM are normal Nose: without d/c or deformity Neck: Neck supple. Gross normal ROM Cardiovascular: Normal rate and regular rhythm.   Pulmonary/Chest: Effort normal and breath sounds without rales or wheezing.  Abd:  Soft, NT, ND, + BS, no organomegaly Neurological: Pt is alert. At baseline orientation, motor grossly intact Skin: Skin is warm. No rashes, other new lesions, no LE edema Psychiatric: Pt  behavior is normal without agitation  All otherwise neg per pt Lab Results  Component Value Date   WBC 7.7 08/03/2020   HGB 13.0 08/03/2020   HCT 40.2 08/03/2020   PLT 281 08/03/2020   GLUCOSE 96 08/03/2020   CHOL 135 08/03/2020   TRIG 165 (H) 08/03/2020   HDL 46 (L) 08/03/2020   LDLDIRECT 137.0 06/17/2018   LDLCALC 65 08/03/2020   ALT 20 08/03/2020   AST 18 08/03/2020   NA 141 08/03/2020   K 4.2 08/03/2020   CL 103 08/03/2020   CREATININE 0.89 08/03/2020   BUN 19 08/03/2020   CO2 30 08/03/2020   TSH 3.01 08/03/2020   HGBA1C 6.1 (H) 08/03/2020   MICROALBUR 0.6 08/03/2020       Assessment & Plan:

## 2020-08-03 NOTE — Patient Instructions (Addendum)
You will be contacted regarding the referral for: colonoscopy  Ok to stop the metformin for now  Please take all new medication as prescribed - the prednisone  Please continue all other medications as before, including the advair, and the albuterol rescure inhaler  Please have the pharmacy call with any other refills you may need.  Please continue your efforts at being more active, low cholesterol diet, and weight control.  You are otherwise up to date with prevention measures today.  Please keep your appointments with your specialists as you may have planned  You will be contacted regarding the referral for: abdomen ultrasound  Please go to the XRAY Department in the first floor for the x-ray testing  Please go to the LAB at the blood drawing area for the tests to be done  You will be contacted by phone if any changes need to be made immediately.  Otherwise, you will receive a letter about your results with an explanation, but please check with MyChart first.  Please remember to sign up for MyChart if you have not done so, as this will be important to you in the future with finding out test results, communicating by private email, and scheduling acute appointments online when needed.  Please make an Appointment to return in 6 months, or sooner if needed, also with Lab Appointment for testing done 3-5 days before at the [facility] (so this is for TWO appointments - please see the scheduling desk as you leave)

## 2020-08-04 ENCOUNTER — Encounter: Payer: Self-pay | Admitting: Internal Medicine

## 2020-08-04 ENCOUNTER — Other Ambulatory Visit: Payer: Self-pay | Admitting: Internal Medicine

## 2020-08-04 DIAGNOSIS — I10 Essential (primary) hypertension: Secondary | ICD-10-CM

## 2020-08-04 LAB — HEMOGLOBIN A1C
Hgb A1c MFr Bld: 6.1 % of total Hgb — ABNORMAL HIGH (ref <5.7)
Mean Plasma Glucose: 128 (calc)
eAG (mmol/L): 7.1 (calc)

## 2020-08-04 LAB — URINALYSIS, ROUTINE W REFLEX MICROSCOPIC
Bacteria, UA: NONE SEEN /HPF
Bilirubin Urine: NEGATIVE
Glucose, UA: NEGATIVE
Hgb urine dipstick: NEGATIVE
Hyaline Cast: NONE SEEN /LPF
Nitrite: NEGATIVE
Protein, ur: NEGATIVE
Specific Gravity, Urine: 1.022 (ref 1.001–1.03)
Squamous Epithelial / HPF: NONE SEEN /HPF (ref <=5)
pH: 6 (ref 5.0–8.0)

## 2020-08-04 LAB — CBC WITH DIFFERENTIAL/PLATELET
Absolute Monocytes: 593 cells/uL (ref 200–950)
Basophils Absolute: 62 cells/uL (ref 0–200)
Basophils Relative: 0.8 %
Eosinophils Absolute: 131 cells/uL (ref 15–500)
Eosinophils Relative: 1.7 %
HCT: 40.2 % (ref 35.0–45.0)
Hemoglobin: 13 g/dL (ref 11.7–15.5)
Lymphs Abs: 1409 cells/uL (ref 850–3900)
MCH: 29 pg (ref 27.0–33.0)
MCHC: 32.3 g/dL (ref 32.0–36.0)
MCV: 89.5 fL (ref 80.0–100.0)
MPV: 11.2 fL (ref 7.5–12.5)
Monocytes Relative: 7.7 %
Neutro Abs: 5506 cells/uL (ref 1500–7800)
Neutrophils Relative %: 71.5 %
Platelets: 281 10*3/uL (ref 140–400)
RBC: 4.49 10*6/uL (ref 3.80–5.10)
RDW: 13.3 % (ref 11.0–15.0)
Total Lymphocyte: 18.3 %
WBC: 7.7 10*3/uL (ref 3.8–10.8)

## 2020-08-04 LAB — LIPID PANEL
Cholesterol: 135 mg/dL (ref <200)
HDL: 46 mg/dL — ABNORMAL LOW (ref >=50)
LDL Cholesterol (Calc): 65 mg/dL (calc)
Non-HDL Cholesterol (Calc): 89 mg/dL (calc) (ref <130)
Total CHOL/HDL Ratio: 2.9 (calc) (ref <5.0)
Triglycerides: 165 mg/dL — ABNORMAL HIGH (ref <150)

## 2020-08-04 LAB — TSH: TSH: 3.01 mIU/L (ref 0.40–4.50)

## 2020-08-04 LAB — VITAMIN D 25 HYDROXY (VIT D DEFICIENCY, FRACTURES): Vit D, 25-Hydroxy: 49 ng/mL (ref 30–100)

## 2020-08-04 LAB — VITAMIN B12: Vitamin B-12: 556 pg/mL (ref 200–1100)

## 2020-08-04 LAB — MICROALBUMIN / CREATININE URINE RATIO
Creatinine, Urine: 146 mg/dL (ref 20–275)
Microalb Creat Ratio: 4 mcg/mg creat (ref <30)
Microalb, Ur: 0.6 mg/dL

## 2020-08-04 NOTE — Telephone Encounter (Signed)
Please refill as per office routine med refill policy (all routine meds refilled for 3 mo or monthly per pt preference up to one year from last visit, then month to month grace period for 3 mo, then further med refills will have to be denied)  

## 2020-08-05 ENCOUNTER — Encounter: Payer: Self-pay | Admitting: Internal Medicine

## 2020-08-05 NOTE — Assessment & Plan Note (Addendum)
Well controlled, ok to stop the metformin, o/w stable overall by history and exam, recent data reviewed with pt, and pt to continue medical treatment as before,  to f/u any worsening symptoms or concerns

## 2020-08-05 NOTE — Assessment & Plan Note (Signed)
Due for colonoscopy 

## 2020-08-05 NOTE — Assessment & Plan Note (Addendum)
For labs today, also abd u/s r/o gb  I spent 31 minutes in preparing to see the patient by review of recent labs, imaging and procedures, obtaining and reviewing separately obtained history, communicating with the patient and family or caregiver, ordering medications, tests or procedures, and documenting clinical information in the EHR including the differential Dx, treatment, and any further evaluation and other management of ruq pain, hx of colon polyps, htn, dm, asthma with cough

## 2020-08-05 NOTE — Assessment & Plan Note (Signed)
For cxr, also predpac asd, cont all other home med tx

## 2020-08-05 NOTE — Assessment & Plan Note (Signed)
stable overall by history and exam, recent data reviewed with pt, and pt to continue medical treatment as before,  to f/u any worsening symptoms or concerns  

## 2020-08-17 ENCOUNTER — Encounter: Payer: Self-pay | Admitting: Internal Medicine

## 2020-08-17 ENCOUNTER — Ambulatory Visit
Admission: RE | Admit: 2020-08-17 | Discharge: 2020-08-17 | Disposition: A | Discharge status: Home / Self Care | Payer: Medicare Other | Source: Ambulatory Visit | Attending: Internal Medicine | Admitting: Internal Medicine

## 2020-08-17 ENCOUNTER — Telehealth: Payer: Self-pay | Admitting: Internal Medicine

## 2020-08-17 DIAGNOSIS — R1011 Right upper quadrant pain: Secondary | ICD-10-CM

## 2020-08-17 NOTE — Telephone Encounter (Signed)
    Patient seeking advice  Patient wants to know what OTC medication  she should take for diarrhea x1 week. Patient requesting prescription

## 2020-08-18 NOTE — Telephone Encounter (Signed)
Immodium would be fine, thanks

## 2020-08-18 NOTE — Telephone Encounter (Signed)
I would try that first

## 2020-08-22 NOTE — Telephone Encounter (Signed)
LVM for pt of Dr. Gwynn Burly note.

## 2020-09-11 NOTE — Progress Notes (Signed)
[facility]  [phone] [phone]     ID: Kristen Howe DOB: 02-25-1946  MR#: [id number]  [id number]#:[id number]  Patient Care Team: Biagio Borg, MD as PCP - General (Internal Medicine) Tauriel Scronce, Virgie Dad, MD as Consulting Physician (Oncology) Kyung Rudd, MD as Consulting Physician (Radiation Oncology) Stark Klein, MD as Consulting Physician (General Surgery) OTHER MD:  CHIEF COMPLAINT: bilateral breast cancer (s/p mastectomies)  CURRENT TREATMENT: Anastrozole   INTERVAL HISTORY: Kristen Howe returns today for follow-up of her estrogen receptor positive breast cancer.   She continues on anastrozole.  Hot flashes and vaginal dryness are not issues for her  Since her last visit, she underwent a bone density screening on 07/28/2020 showing a T-score of -1.9, which is considered osteopenic. This is improved from T-score of -2.2 in 07/2018.   REVIEW OF SYSTEMS: Kristen Howe has lost a little weight because/diet and because she got some teeth pulled.  She is placed about that and is looking forward to getting dentures.  She is exercising by painting the doors in her house.  She does not take walks.  She has not received the COVID-19 vaccine and does not intend to.  Of the 4 people in her home only one of them, her 6 year old nephew, has been vaccinated.  A detailed review of systems today was otherwise stable   BREAST CANCER HISTORY: From the original intake note:  "Kristen Howe" herself palpated a mass in her right breast sometime in November 2016. She brought it to Dr. Purcell Nails attention on 11/20/2015 and he set her up for bilateral diagnostic mammography with tomography and I lateral breast [facility] November 29 2015. The breast density was category B. In the right breast upper outer quadrant there where 2 nodules, at the 9:00 position (which by ultrasound was spiculated and measured 1.7 cm) and at the 11:00 position (which by ultrasound measured 0.7 cm.  The right axilla showed a grossly abnormal lymph node measuring 1.4 cm with complete effacement of the hilum.there were several other small lymph nodes with cortical thickening in the lower right axilla as well.  Biopsy of the right breast mass on 12/12/2015 showed ([id number]) and invasive ductal carcinoma, grade 2, estrogen receptor 100% positive, progesterone receptor 80% positive, both with strong staining intensity, with an MIB-1 of 20%, and HER-2 nonamplified, with a signals ratio of 1.33 and number per cell 3.25.  In the left breast mammographically there was a multifocal mass centered at the 12:00 location with suspicious calcifications. The processes measured 6.7 cm altogether. A dilated duct extended to the nipple from this area, containing microcalcifications. Ultrasonography of the left breast found a multifocal fragmented mass superiorly, the main component being hypoechoic and irregular and tolerable than wide, measuring 2.1 cm.superior to that, there was a second mass measuring 1.9 cm and there were other suspicious solid nodules in the adjacent parenchyma. There was also a separate area of microcalcifications containing a suspicious nodule at the 1:00 position. This measured 0.8 cm. The ultrasound confirmed a dilated duct extending from the superior process to the nipple. An alteration of the left axilla showed a grossly abnormal lymph node measuring up to 1 cm and a second indeterminant lymph node.  Biopsy of the left breast 11:30 o'clock massshowed invasive ductal carcinoma, grade 2, estrogen receptor 100% positive, progesterone receptor 70% positive, with an MIB-1 of 20%, and no HER-2 amplification, the signals ratio being 1.59 and the number per cell 3.90. A second area in the breast described  as retroareolar showed ductal carcinoma, possibly in situ. This was also estrogen receptor positive at 100%, progesterone receptor positive at 60%, with an MIB-1 of 5%, and HER-2 negative, with a  signals ratio of 1.73 and number per cell 3.55. Biopsy of the left breast lymph node was positiveand the prognostic panel there was very similar to all the others, namely estrogen receptor 100% positive, progesterone receptor 40% positive, with an MIB-1 of 40%, and HER-2 no amplification, with a signals ratio of 1.52 and number per cell 3.85.  The patient's subsequent history is as detailed below.   PAST MEDICAL HISTORY: Past Medical History:  Diagnosis Date  . Allergy   . Anemia    as a child  . Anxiety   . Arthritis    "lower back" (01/25/2016)  . Bilateral breast cancer ([place])    [facility] Endo 01/25/2016  . Cataract   . Depression   . Esophageal diverticulum   . GERD (gastroesophageal reflux disease)   . Hyperlipidemia   . Hypertension   . Leg cramps    takes Flexeril  . Migraine    "maybe monthly" (01/25/2016)  . Osteoporosis   . Pneumonia ~ 2005 X 1  . Primary cancer of 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 breast ([facility]) 12/14/2015  . Rheumatic fever 1950s    PAST SURGICAL HISTORY: Past Surgical History:  Procedure Laterality Date  . BREAST BIOPSY Bilateral 2016  . CATARACT EXTRACTION W/ INTRAOCULAR LENS  IMPLANT, BILATERAL Bilateral 05/2015-06/2015  . CESAREAN SECTION  1974; 1976  . COLONOSCOPY    . ESOPHAGEAL MANOMETRY N/A 03/10/2017   Procedure: ESOPHAGEAL MANOMETRY (EM);  Surgeon: Ladene Artist, MD;  Location: [facility];  Service: Endoscopy;  Laterality: N/A;  . EYE MUSCLE SURGERY Bilateral 1950s   for cross eyes as a child  . INSERTION OF MESH N/A 09/12/2015   Procedure: INSERTION OF MESH;  Surgeon: Erroll Luna, MD;  Location: [facility];  Service: General;  Laterality: N/A;  . MASTECTOMY COMPLETE / SIMPLE W/ SENTINEL NODE BIOPSY Right 01/25/2016  . MASTECTOMY MODIFIED RADICAL Left 01/25/2016  . MASTECTOMY W/ SENTINEL NODE BIOPSY Bilateral 01/25/2016   Procedure: RIGHT MASTECTOMY WITH SENTINEL RIGHT LYMPH NODE BIOPSY, LEFT MODIFIED RADICAL MASTECTOMY;  Surgeon: Stark Klein, MD;   Location: [facility];  Service: General;  Laterality: Bilateral;  . TONSILLECTOMY  1950s  . UMBILICAL HERNIA REPAIR N/A 09/12/2015   Procedure: REPAIR OF INCISIONAL AND UMBILICAL HERNIA;  Surgeon: Erroll Luna, MD;  Location: [facility];  Service: General;  Laterality: N/A;    FAMILY HISTORY Family History  Problem Relation Age of Onset  . Healthy Mother   . Other Mother        +"throat polyps"; +smoker  . Lymphoma Father        d. 68  . Diabetes Father   . Heart Problems Father   . Other Father        "tumors on his kidneys"  . Breast cancer Sister        dx. metachronous bilateral breast cancers at 54 and 85; - BRCA1/2 testing  . Cancer Maternal Grandmother 107       dx. spinal cancer  . Stroke Maternal Grandfather   . Gout Paternal Grandmother   . Gout Paternal Grandfather   . Lung cancer Maternal Uncle        d. 70-72; +smoker  . Diabetes Paternal Aunt   . Diabetes Paternal Uncle   . Breast cancer Cousin  maternal 1st cousin dx. unspecified age  . Lung cancer Cousin        maternal 1st cousin dx. lung cancer; +smoker  . Lupus Cousin   . Breast cancer Cousin        paternal 1st cousin dx. 30s  . Breast cancer Cousin        paternal 1st cousin dx. 51s-30s  . Cancer Cousin        paternal 1st cousin dx. oral cancer with metastasis, also "lost a leg to cancer"; dx. 30s; +EtOH abuse  . Breast cancer Cousin        paternal 1st cousin, once-removed dx. at unspecified age  . Colon cancer Neg Hx   . Esophageal cancer Neg Hx   . Rectal cancer Neg Hx   . Stomach cancer Neg Hx   the patient's father died at the age of 50 from complications of diabetes. He had a history of lymphoma. The patient's mother is currently living at age 73. The patient's brother died in an automobile accident. The patient has a twin sister,  -Animator. She has a history of bilateral breast cancers and did undergo genetic testing in November 2012, with no BRCA1 or 2 mutation  found   GYNECOLOGIC HISTORY:  No LMP recorded. Patient is postmenopausal. Menarche age 16, first live birth age 47. The patient is GX P2. She stopped having periods approximately 1997. She did not take hormone replacement. She took oral contraceptives remotely for approximately 3 years, with no complications.   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 She is divorced and lives at home with her sister Joycelyn Schmid, and with 2 nephews, Rush Landmark, 52, who is currently employed and [place], 10, who is disabled. The patient has 3 grandchildren. She is not a church attender    ADVANCED DIRECTIVES: Not in place. At the initial clinic visit  the patient was given the appropriate forms to complete and notarize at her discretion.   HEALTH MAINTENANCE: Social History   Tobacco Use  . Smoking status: Former Smoker    Packs/day: 1.00    Years: 0.00    Pack years: 0.00    Types: Cigarettes    Quit date: 12/24/2015    Years since quitting: 4.7  . Smokeless tobacco: Never Used  Vaping Use  . Vaping Use: Never used  Substance Use Topics  . Alcohol use: No    Alcohol/week: 0.0 standard drinks    Comment: 07/31/16 pt states she dosn't drink anymore,04/16/2016 "1/2 bottle wine per month" previously - not much now  . Drug use: No     Colonoscopy: 09/06/2016  PAP: 2010  Bone density: 07/26/2016 showed a T score of -2.5 osteoporosis   Lipid panel:  No Known Allergies  Current Outpatient Medications  Medication Sig Dispense Refill  . albuterol (VENTOLIN HFA) 108 (90 Base) MCG/ACT inhaler Inhale 2 puffs into the lungs every 6 (six) hours as needed for wheezing or shortness of breath. 18 g 11  . anastrozole (ARIMIDEX) 1 MG tablet Take 1 tablet (1 mg total) by mouth daily. 90 tablet 4  . Artificial Tear Ointment (DRY EYES OP) Apply 1 drop to eye daily as needed (for dry eyes).     Marland Kitchen atorvastatin (LIPITOR) 10 MG tablet Take 1 tablet by  mouth once daily 90 tablet 2  . Calcium-Phosphorus-Vitamin D (CITRACAL +D3 PO) Take 2 tablets by mouth 2 (two) times daily.     Marland Kitchen  cyclobenzaprine (FLEXERIL) 10 MG tablet Take 1 tablet (10 mg total) by mouth 3 (three) times daily as needed. for muscle spams 30 tablet 3  . Fluticasone-Salmeterol (ADVAIR) 250-50 MCG/DOSE AEPB Inhale 1 puff into the lungs in the morning and at bedtime. 60 each 11  . glucose blood test strip 1 each by Other route daily as needed for other. Use as instructed.  Dx: E11.9 100 each 0  . hydrochlorothiazide (HYDRODIURIL) 25 MG tablet Take 1 tablet (25 mg total) by mouth daily. 90 tablet 3  . ibuprofen (ADVIL,MOTRIN) 200 MG tablet Take 400 mg by mouth 2 (two) times daily as needed for moderate pain.    Marland Kitchen losartan (COZAAR) 50 MG tablet Take 1 tablet by mouth once daily 90 tablet 0  . metFORMIN (GLUCOPHAGE) 500 MG tablet TAKE 1 TABLET BY MOUTH DAILY WITH BREAKFAST . APPOINTMENT REQUIRED FOR FUTURE REFILLS 180 tablet 0  . Multiple Vitamins-Minerals (MULTIVITAMIN WITH MINERALS) tablet Take 1 tablet by mouth daily. Reported on 12/20/2015    . pantoprazole (PROTONIX) 40 MG tablet TAKE 1 TABLET BY MOUTH TWICE DAILY BEFORE MEAL(S) 60 tablet 11  . predniSONE (DELTASONE) 10 MG tablet 3 tabs by mouth per day for 3 days,2tabs per day for 3 days,1tab per day for 3 days 18 tablet 0  . venlafaxine XR (EFFEXOR-XR) 75 MG 24 hr capsule Take 1 capsule (75 mg total) by mouth daily with breakfast. 90 capsule 4   No current facility-administered medications for this visit.    OBJECTIVE: White woman who appears stated age  28:   09/12/20 1010  BP: 118/80  Pulse: 72  Resp: 17  Temp: 97.7 F (36.5 C)  SpO2: 95%     Body mass index is 29.41 kg/m.    ECOG FS:1 - Symptomatic but completely ambulatory   Sclerae unicteric, EOMs intact Wearing a mask No cervical or supraclavicular adenopathy Lungs no rales or rhonchi Heart regular rate and rhythm Abd soft, nontender, positive bowel  sounds MSK no focal spinal tenderness, no upper extremity lymphedema Neuro: nonfocal, well oriented, appropriate affect Breasts: Status post bilateral mastectomies.  There is no evidence of local recurrence.  Both axillae are benign.   LAB RESULTS:  CMP     Component Value Date/Time   NA 141 08/03/2020 1443   NA 141 11/04/2017 0952   K 4.2 08/03/2020 1443   K 3.9 11/04/2017 0952   CL 103 08/03/2020 1443   CO2 30 08/03/2020 1443   CO2 26 11/04/2017 0952   GLUCOSE 96 08/03/2020 1443   GLUCOSE 72 11/04/2017 0952   BUN 19 08/03/2020 1443   BUN 20.5 11/04/2017 0952   CREATININE 0.89 08/03/2020 1443   CREATININE 0.9 11/04/2017 0952   CALCIUM 10.9 (H) 08/03/2020 1443   CALCIUM 10.7 (H) 11/04/2017 0952   PROT 7.3 08/03/2020 1443   PROT 8.0 11/04/2017 0952   ALBUMIN 4.3 01/11/2020 0956   ALBUMIN 3.9 11/04/2017 0952   AST 18 08/03/2020 1443   AST 13 (L) 11/03/2018 1102   AST 29 11/04/2017 0952   ALT 20 08/03/2020 1443   ALT 18 11/03/2018 1102   ALT 37 11/04/2017 0952   ALKPHOS 82 01/11/2020 0956   ALKPHOS 81 11/04/2017 0952   BILITOT 0.5 08/03/2020 1443   BILITOT 0.5 11/03/2018 1102   BILITOT 0.34 11/04/2017 0952   GFRNONAA 64 08/03/2020 1443   GFRAA 75 08/03/2020 1443    INo results found for: SPEP, UPEP  Lab Results  Component Value Date   WBC  6.1 09/12/2020   NEUTROABS 4.4 09/12/2020   HGB 12.5 09/12/2020   HCT 37.0 09/12/2020   MCV 85.6 09/12/2020   PLT 268 09/12/2020      Chemistry      Component Value Date/Time   NA 141 08/03/2020 1443   NA 141 11/04/2017 0952   K 4.2 08/03/2020 1443   K 3.9 11/04/2017 0952   CL 103 08/03/2020 1443   CO2 30 08/03/2020 1443   CO2 26 11/04/2017 0952   BUN 19 08/03/2020 1443   BUN 20.5 11/04/2017 0952   CREATININE 0.89 08/03/2020 1443   CREATININE 0.9 11/04/2017 0952      Component Value Date/Time   CALCIUM 10.9 (H) 08/03/2020 1443   CALCIUM 10.7 (H) 11/04/2017 0952   ALKPHOS 82 01/11/2020 0956   ALKPHOS 81  11/04/2017 0952   AST 18 08/03/2020 1443   AST 13 (L) 11/03/2018 1102   AST 29 11/04/2017 0952   ALT 20 08/03/2020 1443   ALT 18 11/03/2018 1102   ALT 37 11/04/2017 0952   BILITOT 0.5 08/03/2020 1443   BILITOT 0.5 11/03/2018 1102   BILITOT 0.34 11/04/2017 0952       No results found for: LABCA2  No components found for: LABCA125  No results for input(s): INR in the last 168 hours.  Urinalysis    Component Value Date/Time   COLORURINE YELLOW 08/03/2020 1443   APPEARANCEUR CLEAR 08/03/2020 1443   LABSPEC 1.022 08/03/2020 1443   PHURINE 6.0 08/03/2020 1443   GLUCOSEU NEGATIVE 08/03/2020 1443   GLUCOSEU NEGATIVE 07/07/2019 1015   HGBUR NEGATIVE 08/03/2020 1443   BILIRUBINUR NEGATIVE 07/07/2019 1015   KETONESUR TRACE (A) 08/03/2020 1443   PROTEINUR NEGATIVE 08/03/2020 1443   UROBILINOGEN 0.2 07/07/2019 1015   NITRITE NEGATIVE 08/03/2020 1443   LEUKOCYTESUR 1+ (A) 08/03/2020 1443    STUDIES: US Abdomen Complete  Result Date: 08/17/2020 CLINICAL DATA:  Pain EXAM: ABDOMEN ULTRASOUND COMPLETE COMPARISON:  11/10/2018 limited abdominal ultrasound. FINDINGS: Gallbladder: Multiple gallstones measuring up to 22 mm. No wall thickening visualized. No sonographic Murphy sign noted by sonographer. Common bile duct: Diameter: 2.3 mm Liver: No focal lesion identified. Increased parenchymal echogenicity. Portal vein is patent on color Doppler imaging with normal direction of blood flow towards the liver. IVC: No abnormality visualized. Pancreas: Visualized portion unremarkable. Spleen: Size and appearance within normal limits. Right Kidney: Length: 10.9 cm. Echogenicity within normal limits. No mass or hydronephrosis visualized. Left Kidney: Length: 11.3 cm. Echogenicity within normal limits. No hydronephrosis visualized. 1.1 x 1.3 x 0.8 cm complex cystic upper pole lesion. Abdominal aorta: No aneurysm visualized. Other findings: None. IMPRESSION: Cholelithiasis without cholecystitis. Hepatic  steatosis.  No focal hepatic lesion. Complex 1.3 cm left upper pole cystic lesion. Electronically Signed   By: Primitivo Gauze M.D.   On: 08/17/2020 10:45     ASSESSMENT: 74 y.o. BRCA negative [place] woman  (1) status post bilateral breast biopsies and left axillary lymph node biopsy 12/12/2015, showing  (a) on the Right upper-outer quadrant, a clinical mT1c N1, stage IIA, estrogen and progesterone receptor positive, HER-2 not amplified, with an MIB-1 of 20%  (b) on the left upper inner quadrant a clinical mT2 pN1, stage IIB invasive ductal carcinoma, grade 2 over 3, estrogen and progesterone receptor positive, HER-2 not amplified, with MIB-1 between 5 and 40%  (c) chest CT and bone scan 01/04/2016 showed no evidence of metastatic disease. They did suggest a large esophageal diverticulum  (2) Status post right mastectomy with sentinel lymph  node sampling and left modified radical mastectomy 01/25/2016  (a) right side: pT2, pN1, stage IIB invasive ductal carcinoma, grade 2, with negative margins  (b) left side: pT3 pN1, stage IIIA invasive ductal carcinoma, grade 2, with negative margins  (c) both cancers were estrogen and progesterone receptor positive, HER-2 nonamplified, with MIB-1 between 5 and 20%   (3) Mammaprint on both breast cancers came back "low risk" predicting a 5 year distant metastasis free survival in the 96% range   (4) no chemotherapy and no right-sided axillary lymph node dissection recommended at conference 02/21/2016  (5) adjuvant radiation completed 05/06/2016   (6) anastrozole started 06/18/2016  (a) bone density 07/26/2016 shows osteoporosis with a T score of -2.5.  (b) received denosumab/Prolia 09/04/2016 through 05/05/2018 (4 doses  (c) bone density 07/28/2020 shows a T score of -1.9.  (7) genetics testing 04/26/2016 through the [facility] through [employer], MD) found no deleterious mutations in ATM, BARD1,  BRCA1, BRCA2, BRIP1, CDH1, CHEK2, FANCC, MLH1, MSH2, MSH6, NBN, PALB2, PMS2, PTEN, RAD51C, RAD51D, TP53, and XRCC2. This panel also includes deletion/duplication analysis (without sequencing) for one gene, EPCAM.   PLAN: Kristen Howe is now 4-1/2 years out from definitive surgery for her breast cancer with no evidence of disease recurrence.  This is very favorable.  She is tolerating anastrozole well.  The plan is to continue that for a total of 5 years which will bring [place] to July of next year  I reviewed her bone density results with her.  These are improved, likely because of the Prolia that she received in the past.  I commended her diet program and her weight loss  She will see me 1 last time next year after which she will be ready to "graduate".  Total encounter time 25 minutes.Sarajane Jews C. Jayleon Mcfarlane, MD 09/12/20 10:12 AM Medical Oncology and Hematology [facility] [place], [place] [address] Tel. [phone]    Fax. [phone]   I, Wilburn Mylar, am acting as scribe for Dr. Virgie Dad. Izell Labat.  I, Lurline Del MD, have reviewed the above documentation for accuracy and completeness, and I agree with the above.   *Total Encounter Time as defined by the Centers for Medicare and Medicaid Services includes, in addition to the face-to-face time of a patient visit (documented in the note above) non-face-to-face time: obtaining and reviewing outside history, ordering and reviewing medications, tests or procedures, care coordination (communications with other health care professionals or caregivers) and documentation in the medical record.

## 2020-09-12 ENCOUNTER — Other Ambulatory Visit: Payer: Self-pay

## 2020-09-12 ENCOUNTER — Inpatient Hospital Stay: Payer: Medicare Other | Attending: Oncology | Admitting: Oncology

## 2020-09-12 ENCOUNTER — Inpatient Hospital Stay: Payer: Medicare Other

## 2020-09-12 VITALS — BP 118/80 | HR 72 | Temp 97.7°F | Resp 17 | Ht 60.0 in | Wt 150.6 lb

## 2020-09-12 DIAGNOSIS — E119 Type 2 diabetes mellitus without complications: Secondary | ICD-10-CM

## 2020-09-12 DIAGNOSIS — Z170 Estrogen receptor positive status [ER+]: Secondary | ICD-10-CM

## 2020-09-12 DIAGNOSIS — F419 Anxiety disorder, unspecified: Secondary | ICD-10-CM

## 2020-09-12 DIAGNOSIS — Z9013 Acquired absence of bilateral breasts and nipples: Secondary | ICD-10-CM | POA: Diagnosis not present

## 2020-09-12 DIAGNOSIS — C50911 Malignant neoplasm of unspecified site of right female breast: Secondary | ICD-10-CM

## 2020-09-12 DIAGNOSIS — Z87891 Personal history of nicotine dependence: Secondary | ICD-10-CM | POA: Diagnosis not present

## 2020-09-12 DIAGNOSIS — C50411 Malignant neoplasm of upper-outer quadrant of right female breast: Secondary | ICD-10-CM | POA: Insufficient documentation

## 2020-09-12 DIAGNOSIS — C50212 Malignant neoplasm of upper-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 breast: Secondary | ICD-10-CM | POA: Diagnosis not present

## 2020-09-12 DIAGNOSIS — Z923 Personal history of irradiation: Secondary | ICD-10-CM | POA: Diagnosis not present

## 2020-09-12 DIAGNOSIS — F321 Major depressive disorder, single episode, moderate: Secondary | ICD-10-CM

## 2020-09-12 DIAGNOSIS — C50912 Malignant neoplasm of unspecified site of left female breast: Secondary | ICD-10-CM | POA: Diagnosis not present

## 2020-09-12 DIAGNOSIS — M62838 Other muscle spasm: Secondary | ICD-10-CM

## 2020-09-12 LAB — COMPREHENSIVE METABOLIC PANEL
ALT: 24 U/L (ref 0–44)
AST: 19 U/L (ref 15–41)
Albumin: 3.6 g/dL (ref 3.5–5.0)
Alkaline Phosphatase: 81 U/L (ref 38–126)
Anion gap: 7 (ref 5–15)
BUN: 12 mg/dL (ref 8–23)
CO2: 26 mmol/L (ref 22–32)
Calcium: 9.7 mg/dL (ref 8.9–10.3)
Chloride: 106 mmol/L (ref 98–111)
Creatinine, Ser: 0.85 mg/dL (ref 0.44–1.00)
GFR calc Af Amer: >60 mL/min (ref >60)
GFR calc non Af Amer: >60 mL/min (ref >60)
Glucose, Bld: 159 mg/dL — ABNORMAL HIGH (ref 70–99)
Potassium: 3.7 mmol/L (ref 3.5–5.1)
Sodium: 139 mmol/L (ref 135–145)
Total Bilirubin: 0.4 mg/dL (ref 0.3–1.2)
Total Protein: 7.1 g/dL (ref 6.5–8.1)

## 2020-09-12 LAB — CBC WITH DIFFERENTIAL/PLATELET
Abs Immature Granulocytes: 0.01 10*3/uL (ref 0.00–0.07)
Basophils Absolute: 0.1 10*3/uL (ref 0.0–0.1)
Basophils Relative: 1 %
Eosinophils Absolute: 0.1 10*3/uL (ref 0.0–0.5)
Eosinophils Relative: 2 %
HCT: 37 % (ref 36.0–46.0)
Hemoglobin: 12.5 g/dL (ref 12.0–15.0)
Immature Granulocytes: 0 %
Lymphocytes Relative: 18 %
Lymphs Abs: 1.1 10*3/uL (ref 0.7–4.0)
MCH: 28.9 pg (ref 26.0–34.0)
MCHC: 33.8 g/dL (ref 30.0–36.0)
MCV: 85.6 fL (ref 80.0–100.0)
Monocytes Absolute: 0.4 10*3/uL (ref 0.1–1.0)
Monocytes Relative: 7 %
Neutro Abs: 4.4 10*3/uL (ref 1.7–7.7)
Neutrophils Relative %: 72 %
Platelets: 268 10*3/uL (ref 150–400)
RBC: 4.32 MIL/uL (ref 3.87–5.11)
RDW: 13.4 % (ref 11.5–15.5)
WBC: 6.1 10*3/uL (ref 4.0–10.5)
nRBC: 0 % (ref 0.0–0.2)

## 2020-09-12 MED ORDER — ANASTROZOLE 1 MG PO TABS: 1.0000 mg | ORAL_TABLET | Freq: Every day | ORAL | 4 refills | Status: DC

## 2020-09-13 ENCOUNTER — Telehealth: Payer: Self-pay | Admitting: Oncology

## 2020-09-13 NOTE — Telephone Encounter (Signed)
Scheduled appts per 9/28 los. Pt confirmed appt date and time.

## 2020-11-01 ENCOUNTER — Other Ambulatory Visit: Payer: Self-pay | Admitting: Internal Medicine

## 2020-11-01 NOTE — Telephone Encounter (Signed)
Please refill as per office routine med refill policy (all routine meds refilled for 3 mo or monthly per pt preference up to one year from last visit, then month to month grace period for 3 mo, then further med refills will have to be denied)  

## 2020-11-07 ENCOUNTER — Other Ambulatory Visit: Payer: Self-pay | Admitting: Oncology

## 2020-11-07 ENCOUNTER — Other Ambulatory Visit: Payer: Self-pay | Admitting: Internal Medicine

## 2020-11-07 DIAGNOSIS — I10 Essential (primary) hypertension: Secondary | ICD-10-CM

## 2020-11-10 ENCOUNTER — Other Ambulatory Visit: Payer: Self-pay | Admitting: Oncology

## 2020-11-10 DIAGNOSIS — E119 Type 2 diabetes mellitus without complications: Secondary | ICD-10-CM

## 2020-12-26 DIAGNOSIS — H26493 Other secondary cataract, bilateral: Secondary | ICD-10-CM | POA: Diagnosis not present

## 2021-01-02 ENCOUNTER — Other Ambulatory Visit: Payer: Self-pay | Admitting: Oncology

## 2021-01-02 ENCOUNTER — Other Ambulatory Visit: Payer: Self-pay | Admitting: Internal Medicine

## 2021-01-02 DIAGNOSIS — F419 Anxiety disorder, unspecified: Secondary | ICD-10-CM

## 2021-01-02 DIAGNOSIS — E785 Hyperlipidemia, unspecified: Secondary | ICD-10-CM

## 2021-01-02 DIAGNOSIS — F321 Major depressive disorder, single episode, moderate: Secondary | ICD-10-CM

## 2021-01-02 NOTE — Telephone Encounter (Signed)
Please refill as per office routine med refill policy (all routine meds refilled for 3 mo or monthly per pt preference up to one year from last visit, then month to month grace period for 3 mo, then further med refills will have to be denied)  

## 2021-02-05 ENCOUNTER — Other Ambulatory Visit: Payer: Self-pay

## 2021-02-05 ENCOUNTER — Ambulatory Visit (INDEPENDENT_AMBULATORY_CARE_PROVIDER_SITE_OTHER): Payer: Medicare Other | Admitting: Internal Medicine

## 2021-02-05 ENCOUNTER — Encounter: Payer: Self-pay | Admitting: Internal Medicine

## 2021-02-05 VITALS — BP 116/78 | HR 67 | Temp 98.6°F | Ht 60.0 in | Wt 154.0 lb

## 2021-02-05 DIAGNOSIS — E1165 Type 2 diabetes mellitus with hyperglycemia: Secondary | ICD-10-CM

## 2021-02-05 DIAGNOSIS — I700 Atherosclerosis of aorta: Secondary | ICD-10-CM

## 2021-02-05 DIAGNOSIS — N281 Cyst of kidney, acquired: Secondary | ICD-10-CM

## 2021-02-05 DIAGNOSIS — I10 Essential (primary) hypertension: Secondary | ICD-10-CM

## 2021-02-05 DIAGNOSIS — R1900 Intra-abdominal and pelvic swelling, mass and lump, unspecified site: Secondary | ICD-10-CM

## 2021-02-05 DIAGNOSIS — F5101 Primary insomnia: Secondary | ICD-10-CM

## 2021-02-05 DIAGNOSIS — Z0000 Encounter for general adult medical examination without abnormal findings: Secondary | ICD-10-CM

## 2021-02-05 DIAGNOSIS — Z0001 Encounter for general adult medical examination with abnormal findings: Secondary | ICD-10-CM

## 2021-02-05 DIAGNOSIS — G4700 Insomnia, unspecified: Secondary | ICD-10-CM | POA: Insufficient documentation

## 2021-02-05 LAB — CBC WITH DIFFERENTIAL/PLATELET
Basophils Absolute: 0 10*3/uL (ref 0.0–0.1)
Basophils Relative: 0.7 % (ref 0.0–3.0)
Eosinophils Absolute: 0.1 10*3/uL (ref 0.0–0.7)
Eosinophils Relative: 1.9 % (ref 0.0–5.0)
HCT: 38.2 % (ref 36.0–46.0)
Hemoglobin: 13 g/dL (ref 12.0–15.0)
Lymphocytes Relative: 19.8 % (ref 12.0–46.0)
Lymphs Abs: 1.3 10*3/uL (ref 0.7–4.0)
MCHC: 34.1 g/dL (ref 30.0–36.0)
MCV: 84.9 fl (ref 78.0–100.0)
Monocytes Absolute: 0.7 10*3/uL (ref 0.1–1.0)
Monocytes Relative: 9.7 % (ref 3.0–12.0)
Neutro Abs: 4.6 10*3/uL (ref 1.4–7.7)
Neutrophils Relative %: 67.9 % (ref 43.0–77.0)
Platelets: 265 10*3/uL (ref 150.0–400.0)
RBC: 4.5 Mil/uL (ref 3.87–5.11)
RDW: 14.2 % (ref 11.5–15.5)
WBC: 6.7 10*3/uL (ref 4.0–10.5)

## 2021-02-05 LAB — URINALYSIS, ROUTINE W REFLEX MICROSCOPIC
Bilirubin Urine: NEGATIVE
Hgb urine dipstick: NEGATIVE
Ketones, ur: NEGATIVE
Leukocytes,Ua: NEGATIVE
Nitrite: NEGATIVE
RBC / HPF: NONE SEEN (ref 0–?)
Specific Gravity, Urine: 1.02 (ref 1.000–1.030)
Total Protein, Urine: NEGATIVE
Urine Glucose: NEGATIVE
Urobilinogen, UA: 0.2 (ref 0.0–1.0)
pH: 7 (ref 5.0–8.0)

## 2021-02-05 LAB — LIPID PANEL
Cholesterol: 135 mg/dL (ref 0–200)
HDL: 39.7 mg/dL (ref >39.00)
LDL Cholesterol: 63 mg/dL (ref 0–99)
NonHDL: 95.76
Total CHOL/HDL Ratio: 3
Triglycerides: 162 mg/dL — ABNORMAL HIGH (ref 0.0–149.0)
VLDL: 32.4 mg/dL (ref 0.0–40.0)

## 2021-02-05 LAB — BASIC METABOLIC PANEL
BUN: 21 mg/dL (ref 6–23)
CO2: 28 mEq/L (ref 19–32)
Calcium: 10.7 mg/dL — ABNORMAL HIGH (ref 8.4–10.5)
Chloride: 103 mEq/L (ref 96–112)
Creatinine, Ser: 0.9 mg/dL (ref 0.40–1.20)
GFR: 63.01 mL/min (ref >60.00)
Glucose, Bld: 95 mg/dL (ref 70–99)
Potassium: 4.3 mEq/L (ref 3.5–5.1)
Sodium: 141 mEq/L (ref 135–145)

## 2021-02-05 LAB — MICROALBUMIN / CREATININE URINE RATIO
Creatinine,U: 99.9 mg/dL
Microalb Creat Ratio: 0.7 mg/g (ref 0.0–30.0)
Microalb, Ur: <0.7 mg/dL (ref 0.0–1.9)

## 2021-02-05 LAB — HEPATIC FUNCTION PANEL
ALT: 28 U/L (ref 0–35)
AST: 17 U/L (ref 0–37)
Albumin: 4.3 g/dL (ref 3.5–5.2)
Alkaline Phosphatase: 85 U/L (ref 39–117)
Bilirubin, Direct: 0.1 mg/dL (ref 0.0–0.3)
Total Bilirubin: 0.5 mg/dL (ref 0.2–1.2)
Total Protein: 7.2 g/dL (ref 6.0–8.3)

## 2021-02-05 LAB — HEMOGLOBIN A1C: Hgb A1c MFr Bld: 6.6 % — ABNORMAL HIGH (ref 4.6–6.5)

## 2021-02-05 LAB — TSH: TSH: 3.28 u[IU]/mL (ref 0.35–4.50)

## 2021-02-05 MED ORDER — LOSARTAN POTASSIUM 50 MG PO TABS: 50.0000 mg | ORAL_TABLET | Freq: Every day | ORAL | 0 refills | Status: DC

## 2021-02-05 MED ORDER — ZOLPIDEM TARTRATE 5 MG PO TABS
5.0000 mg | ORAL_TABLET | Freq: Every evening | ORAL | 5 refills | Status: DC | PRN
Start: 2021-02-05 — End: 2021-08-08

## 2021-02-05 NOTE — Assessment & Plan Note (Signed)
For low chol diet, lipitor 10   Current Outpatient Medications (Cardiovascular):  .  atorvastatin (LIPITOR) 10 MG tablet, Take 1 tablet by mouth once daily .  hydrochlorothiazide (HYDRODIURIL) 25 MG tablet, Take 1 tablet by mouth once daily .  losartan (COZAAR) 50 MG tablet, Take 1 tablet (50 mg total) by mouth daily.  Current Outpatient Medications (Respiratory):  .  albuterol (VENTOLIN HFA) 108 (90 Base) MCG/ACT inhaler, Inhale 2 puffs into the lungs every 6 (six) hours as needed for wheezing or shortness of breath. .  Fluticasone-Salmeterol (ADVAIR) 250-50 MCG/DOSE AEPB, Inhale 1 puff into the lungs in the morning and at bedtime.  Current Outpatient Medications (Analgesics):  .  ibuprofen (ADVIL,MOTRIN) 200 MG tablet, Take 400 mg by mouth 2 (two) times daily as needed for moderate pain. Marland Kitchen  ibuprofen (ADVIL) 800 MG tablet, Take 800 mg by mouth every 6 (six) hours as needed.   Current Outpatient Medications (Other):  Marland Kitchen  ACCU-CHEK GUIDE test strip, USE AS DIRECTED ONCE DAILY .  anastrozole (ARIMIDEX) 1 MG tablet, Take 1 tablet by mouth once daily .  Artificial Tear Ointment (DRY EYES OP), Apply 1 drop to eye daily as needed (for dry eyes).  .  Calcium-Phosphorus-Vitamin D (CITRACAL +D3 PO), Take 2 tablets by mouth 2 (two) times daily.  .  cyclobenzaprine (FLEXERIL) 10 MG tablet, Take 1 tablet (10 mg total) by mouth 3 (three) times daily as needed. for muscle spams .  Multiple Vitamins-Minerals (MULTIVITAMIN WITH MINERALS) tablet, Take 1 tablet by mouth daily. Reported on 12/20/2015 .  pantoprazole (PROTONIX) 40 MG tablet, TAKE 1 TABLET BY MOUTH TWICE DAILY BEFORE MEAL(S) .  venlafaxine XR (EFFEXOR-XR) 75 MG 24 hr capsule, TAKE 1 CAPSULE BY MOUTH ONCE DAILY WITH BREAKFAST .  zolpidem (AMBIEN) 5 MG tablet, Take 1 tablet (5 mg total) by mouth at bedtime as needed for sleep.

## 2021-02-05 NOTE — Assessment & Plan Note (Signed)
BP Readings from Last 3 Encounters:  02/05/21 116/78  09/12/20 118/80  08/03/20 118/70   Stable, pt to continue medical treatment  - hct, losartan

## 2021-02-05 NOTE — Patient Instructions (Signed)
Please call if you change your mind about having the colonoscopy  .You will be contacted regarding the referral for: MRI for the left kidney cyst  Please continue all other medications as before, and refills have been done if requested.  Please have the pharmacy call with any other refills you may need.  Please continue your efforts at being more active, low cholesterol diet, and weight control.  You are otherwise up to date with prevention measures today.  Please keep your appointments with your specialists as you may have planned  Please go to the LAB at the blood drawing area for the tests to be done  You will be contacted by phone if any changes need to be made immediately.  Otherwise, you will receive a letter about your results with an explanation, but please check with MyChart first.  Please remember to sign up for MyChart if you have not done so, as this will be important to you in the future with finding out test results, communicating by private email, and scheduling acute appointments online when needed.  Please make an Appointment to return in 6 months, or sooner if needed

## 2021-02-05 NOTE — Assessment & Plan Note (Signed)
[place] for abd MRI for further eval, consider urology referral

## 2021-02-05 NOTE — Assessment & Plan Note (Signed)
Ok for ambien qhs prn,  to f/u any worsening symptoms or concerns  

## 2021-02-05 NOTE — Progress Notes (Signed)
Patient ID: Kristen Howe, female   DOB: 28-Feb-1946, 75 y.o.   MRN: [id number]         Chief Complaint:: wellness exam and DM, left renal cyst and insomnia worsening       HPI:  Kristen Howe is a 75 y.o. female here for wellness exam; declines referral for screening colonoscopy, o/w up to date with preventive referrals and immunizations                      Also c/o 3-4 mo worsening insomnia where she will be in bed by 8 pm and tv off by 10 most nights but often just cant get to sleep to 4 am,, just lies there, nothing else seems to make better o worse.  Denies worsening depressive symptoms, suicidal ideation, or panic; has ongoing anxiety.   Pt denies polydipsia, polyuria, Denies focal neuro s/s.  Pt denies chest pain, increased sob or doe, wheezing, orthopnea, PND, increased LE swelling, palpitations, dizziness or syncope.  Denies urinary symptoms such as dysuria, frequency, urgency, flank pain, hematuria or n/v, fever, chills, but has abd u/s 2022 due for f/u left renal cyst.     Wt Readings from Last 3 Encounters:  02/05/21 154 lb (69.9 kg)  09/12/20 150 lb 9.6 oz (68.3 kg)  08/03/20 150 lb (68 kg)   BP Readings from Last 3 Encounters:  02/05/21 116/78  09/12/20 118/80  08/03/20 118/70   Immunization History  Administered Date(s) Administered  . Pneumococcal Conjugate-13 07/07/2019  . Tdap 07/07/2019   There are no preventive care reminders to display for this patient.    Past Medical History:  Diagnosis Date  . Allergy   . Anemia    as a child  . Anxiety   . Arthritis    "lower back" (01/25/2016)  . Bilateral breast cancer ([place])    Archie Endo 01/25/2016  . Cataract   . Depression   . Esophageal diverticulum   . GERD (gastroesophageal reflux disease)   . Hyperlipidemia   . Hypertension   . Leg cramps    takes Flexeril  . Migraine    "maybe monthly" (01/25/2016)  . Osteoporosis   . Pneumonia ~ 2005 X 1  . Primary cancer of 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 breast ([place])  12/14/2015  . Rheumatic fever 1950s   Past Surgical History:  Procedure Laterality Date  . BREAST BIOPSY Bilateral 2016  . CATARACT EXTRACTION W/ INTRAOCULAR LENS  IMPLANT, BILATERAL Bilateral 05/2015-06/2015  . CESAREAN SECTION  1974; 1976  . COLONOSCOPY    . ESOPHAGEAL MANOMETRY N/A 03/10/2017   Procedure: ESOPHAGEAL MANOMETRY (EM);  Surgeon: Ladene Artist, MD;  Location: [facility];  Service: Endoscopy;  Laterality: N/A;  . EYE MUSCLE SURGERY Bilateral 1950s   for cross eyes as a child  . INSERTION OF MESH N/A 09/12/2015   Procedure: INSERTION OF MESH;  Surgeon: Erroll Luna, MD;  Location: [facility];  Service: General;  Laterality: N/A;  . MASTECTOMY COMPLETE / SIMPLE W/ SENTINEL NODE BIOPSY Right 01/25/2016  . MASTECTOMY MODIFIED RADICAL Left 01/25/2016  . MASTECTOMY W/ SENTINEL NODE BIOPSY Bilateral 01/25/2016   Procedure: RIGHT MASTECTOMY WITH SENTINEL RIGHT LYMPH NODE BIOPSY, LEFT MODIFIED RADICAL MASTECTOMY;  Surgeon: Stark Klein, MD;  Location: [facility];  Service: General;  Laterality: Bilateral;  . TONSILLECTOMY  1950s  . UMBILICAL HERNIA REPAIR N/A 09/12/2015   Procedure: REPAIR OF INCISIONAL AND UMBILICAL HERNIA;  Surgeon: Erroll Luna, MD;  Location: [facility];  Service:  General;  Laterality: N/A;    reports that she quit smoking about 5 years ago. Her smoking use included cigarettes. She smoked 1.00 pack per day for 0.00 years. She has never used smokeless tobacco. She reports that she does not drink alcohol and does not use drugs. family history includes Breast cancer in her cousin, cousin, cousin, cousin, and sister; Cancer in her cousin; Cancer (age of onset: 35) in her maternal grandmother; Diabetes in her father, paternal aunt, and paternal uncle; Gout in her paternal grandfather and paternal grandmother; Healthy in her mother; Heart Problems in her father; Lung cancer in her cousin and maternal uncle; Lupus in her cousin; Lymphoma in her father; Other in her father and mother; Stroke  in her maternal grandfather. No Known Allergies Current Outpatient Medications on File Prior to Visit  Medication Sig Dispense Refill  . ACCU-CHEK GUIDE test strip USE AS DIRECTED ONCE DAILY 100 each 0  . albuterol (VENTOLIN HFA) 108 (90 Base) MCG/ACT inhaler Inhale 2 puffs into the lungs every 6 (six) hours as needed for wheezing or shortness of breath. 18 g 11  . anastrozole (ARIMIDEX) 1 MG tablet Take 1 tablet by mouth once daily 90 tablet 0  . Artificial Tear Ointment (DRY EYES OP) Apply 1 drop to eye daily as needed (for dry eyes).     Marland Kitchen atorvastatin (LIPITOR) 10 MG tablet Take 1 tablet by mouth once daily 90 tablet 0  . Calcium-Phosphorus-Vitamin D (CITRACAL +D3 PO) Take 2 tablets by mouth 2 (two) times daily.     . cyclobenzaprine (FLEXERIL) 10 MG tablet Take 1 tablet (10 mg total) by mouth 3 (three) times daily as needed. for muscle spams 30 tablet 3  . Fluticasone-Salmeterol (ADVAIR) 250-50 MCG/DOSE AEPB Inhale 1 puff into the lungs in the morning and at bedtime. 60 each 11  . hydrochlorothiazide (HYDRODIURIL) 25 MG tablet Take 1 tablet by mouth once daily 90 tablet 0  . ibuprofen (ADVIL,MOTRIN) 200 MG tablet Take 400 mg by mouth 2 (two) times daily as needed for moderate pain.    . Multiple Vitamins-Minerals (MULTIVITAMIN WITH MINERALS) tablet Take 1 tablet by mouth daily. Reported on 12/20/2015    . pantoprazole (PROTONIX) 40 MG tablet TAKE 1 TABLET BY MOUTH TWICE DAILY BEFORE MEAL(S) 60 tablet 11  . venlafaxine XR (EFFEXOR-XR) 75 MG 24 hr capsule TAKE 1 CAPSULE BY MOUTH ONCE DAILY WITH BREAKFAST 90 capsule 0  . ibuprofen (ADVIL) 800 MG tablet Take 800 mg by mouth every 6 (six) hours as needed.     No current facility-administered medications on file prior to visit.        ROS:  All others reviewed and negative.  Objective        PE:  BP 116/78   Pulse 67   Temp 98.6 F (37 C) (Oral)   Ht 5' (1.524 m)   Wt 154 lb (69.9 kg)   SpO2 96%   BMI 30.08 kg/m                  Constitutional: Pt appears in NAD               HENT: Head: NCAT.                Right Ear: External ear normal.                 Left Ear: External ear normal.  Eyes: . Pupils are equal, round, and reactive to light. Conjunctivae and EOM are normal               Nose: without d/c or deformity               Neck: Neck supple. Gross normal ROM               Cardiovascular: Normal rate and regular rhythm.                 Pulmonary/Chest: Effort normal and breath sounds without rales or wheezing.                Abd:  Soft, NT, ND, + BS, no organomegaly               Neurological: Pt is alert. At baseline orientation, motor grossly intact               Skin: Skin is warm. No rashes, no other new lesions, LE edema - none               Psychiatric: Pt behavior is normal without agitation   Micro: none  Cardiac tracings I have personally interpreted today:  none  Pertinent Radiological findings (summarize): abd u/s 08-17-2020 IMPRESSION: Cholelithiasis without cholecystitis.  Hepatic steatosis.  No focal hepatic lesion.  Complex 1.3 cm left upper pole cystic lesion.   Lab Results  Component Value Date   WBC 6.7 02/05/2021   HGB 13.0 02/05/2021   HCT 38.2 02/05/2021   PLT 265.0 02/05/2021   GLUCOSE 95 02/05/2021   CHOL 135 02/05/2021   TRIG 162.0 (H) 02/05/2021   HDL 39.70 02/05/2021   LDLDIRECT 137.0 06/17/2018   LDLCALC 63 02/05/2021   ALT 28 02/05/2021   AST 17 02/05/2021   NA 141 02/05/2021   K 4.3 02/05/2021   CL 103 02/05/2021   CREATININE 0.90 02/05/2021   BUN 21 02/05/2021   CO2 28 02/05/2021   TSH 3.28 02/05/2021   HGBA1C 6.6 (H) 02/05/2021   MICROALBUR <0.7 02/05/2021   Assessment/Plan:  Charmain Diosdado is a 75 y.o. White or Caucasian [1] female with  has a past medical history of Allergy, Anemia, Anxiety, Arthritis, Bilateral breast cancer ([place]), Cataract, Depression, Esophageal diverticulum, GERD (gastroesophageal reflux disease), Hyperlipidemia,  Hypertension, Leg cramps, Migraine, Osteoporosis, Pneumonia (~ 2005 X 1), Primary cancer of 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 breast ([place]) (12/14/2015), and Rheumatic fever (1950s).  Encounter for well adult exam with abnormal findings Age and sex appropriate education and counseling updated with regular exercise and diet Referrals for preventative services - declines colonoscopy Immunizations addressed - none needed Smoking counseling  - none needed Evidence for depression or other mood disorder - none significant Most recent labs reviewed. I have personally reviewed and have noted: 1) the patient's medical and social history 2) The patient's current medications and supplements 3) The patient's height, weight, and BMI have been recorded in the chart   Insomnia Ok for ambien qhs prn,  to f/u any worsening symptoms or concerns   Acquired complex cyst of kidney Ok for abd MRI for further eval, consider urology referral  Aortic atherosclerosis ([place]) For low chol diet, lipitor 10   Current Outpatient Medications (Cardiovascular):  .  atorvastatin (LIPITOR) 10 MG tablet, Take 1 tablet by mouth once daily .  hydrochlorothiazide (HYDRODIURIL) 25 MG tablet, Take 1 tablet by mouth once daily .  losartan (COZAAR) 50 MG tablet, Take 1 tablet (50 mg  total) by mouth daily.  Current Outpatient Medications (Respiratory):  .  albuterol (VENTOLIN HFA) 108 (90 Base) MCG/ACT inhaler, Inhale 2 puffs into the lungs every 6 (six) hours as needed for wheezing or shortness of breath. .  Fluticasone-Salmeterol (ADVAIR) 250-50 MCG/DOSE AEPB, Inhale 1 puff into the lungs in the morning and at bedtime.  Current Outpatient Medications (Analgesics):  .  ibuprofen (ADVIL,MOTRIN) 200 MG tablet, Take 400 mg by mouth 2 (two) times daily as needed for moderate pain. Marland Kitchen  ibuprofen (ADVIL) 800 MG tablet, Take 800 mg by mouth every 6 (six) hours as needed.   Current Outpatient Medications (Other):  Marland Kitchen  ACCU-CHEK  GUIDE test strip, USE AS DIRECTED ONCE DAILY .  anastrozole (ARIMIDEX) 1 MG tablet, Take 1 tablet by mouth once daily .  Artificial Tear Ointment (DRY EYES OP), Apply 1 drop to eye daily as needed (for dry eyes).  .  Calcium-Phosphorus-Vitamin D (CITRACAL +D3 PO), Take 2 tablets by mouth 2 (two) times daily.  .  cyclobenzaprine (FLEXERIL) 10 MG tablet, Take 1 tablet (10 mg total) by mouth 3 (three) times daily as needed. for muscle spams .  Multiple Vitamins-Minerals (MULTIVITAMIN WITH MINERALS) tablet, Take 1 tablet by mouth daily. Reported on 12/20/2015 .  pantoprazole (PROTONIX) 40 MG tablet, TAKE 1 TABLET BY MOUTH TWICE DAILY BEFORE MEAL(S) .  venlafaxine XR (EFFEXOR-XR) 75 MG 24 hr capsule, TAKE 1 CAPSULE BY MOUTH ONCE DAILY WITH BREAKFAST .  zolpidem (AMBIEN) 5 MG tablet, Take 1 tablet (5 mg total) by mouth at bedtime as needed for sleep.   Diabetes mellitus ([place]) Lab Results  Component Value Date   HGBA1C 6.6 (H) 02/05/2021   Stable, pt to continue current medical treatment  - diet and wt control   Essential hypertension BP Readings from Last 3 Encounters:  02/05/21 116/78  09/12/20 118/80  08/03/20 118/70   Stable, pt to continue medical treatment  - hct, losartan   Followup: Return in about 6 months (around 08/05/2021).  Cathlean Cower, MD 02/05/2021 8:47 PM [facility]

## 2021-02-05 NOTE — Assessment & Plan Note (Signed)
Lab Results  Component Value Date   HGBA1C 6.6 (H) 02/05/2021   Stable, pt to continue current medical treatment  - diet and wt control

## 2021-02-05 NOTE — Assessment & Plan Note (Signed)
Age and sex appropriate education and counseling updated with regular exercise and diet ?Referrals for preventative services - declines colonoscopy ?Immunizations addressed - none needed ?Smoking counseling  - none needed ?Evidence for depression or other mood disorder - none significant ?Most recent labs reviewed. ?I have personally reviewed and have noted: ?1) the patient's medical and social history ?2) The patient's current medications and supplements ?3) The patient's height, weight, and BMI have been recorded in the chart ? ?

## 2021-02-11 ENCOUNTER — Other Ambulatory Visit: Payer: Self-pay | Admitting: Oncology

## 2021-02-11 ENCOUNTER — Other Ambulatory Visit: Payer: Self-pay | Admitting: Internal Medicine

## 2021-02-11 NOTE — Telephone Encounter (Signed)
Please refill as per office routine med refill policy (all routine meds refilled for 3 mo or monthly per pt preference up to one year from last visit, then month to month grace period for 3 mo, then further med refills will have to be denied)  

## 2021-02-13 DIAGNOSIS — H26493 Other secondary cataract, bilateral: Secondary | ICD-10-CM | POA: Diagnosis not present

## 2021-02-21 ENCOUNTER — Telehealth: Payer: Self-pay | Admitting: Internal Medicine

## 2021-02-21 NOTE — Telephone Encounter (Signed)
Patient requesting medication to take prior to getting MRI to help with anxiety  [facility] pharmacy on [address]

## 2021-02-23 MED ORDER — ALPRAZOLAM 0.5 MG PO TABS: ORAL_TABLET | ORAL | 0 refills | Status: DC

## 2021-02-23 NOTE — Telephone Encounter (Signed)
Alprazolam prescription emailed.  Take 1 hour prior to the test.  She needs to have a chauffeur to drive her to and from the MRI.  Thanks

## 2021-02-23 NOTE — Telephone Encounter (Signed)
Patient called to follow up on request. She has been informed of information below.

## 2021-02-26 NOTE — Telephone Encounter (Signed)
Called pharmacy verified if rx was received on 02/23/21. Per pharmacist was not receive. Gave verbal to pharmacist Nira Conn). Notified pt rx has been called in.Marland KitchenJohny Howe

## 2021-02-26 NOTE — Telephone Encounter (Signed)
   Follow up message  Patient states pharmacy does not have order for Alprazolam that shows sent by Dr Alain Marion  Please resend, MRI scheduled for 3/15

## 2021-02-27 ENCOUNTER — Ambulatory Visit
Admission: RE | Admit: 2021-02-27 | Discharge: 2021-02-27 | Disposition: A | Discharge status: Home / Self Care | Payer: Medicare Other | Source: Ambulatory Visit | Attending: Internal Medicine | Admitting: Internal Medicine

## 2021-02-27 ENCOUNTER — Encounter: Payer: Self-pay | Admitting: Internal Medicine

## 2021-02-27 ENCOUNTER — Other Ambulatory Visit: Payer: Self-pay

## 2021-02-27 DIAGNOSIS — K760 Fatty (change of) liver, not elsewhere classified: Secondary | ICD-10-CM | POA: Diagnosis not present

## 2021-02-27 DIAGNOSIS — Q396 Congenital diverticulum of esophagus: Secondary | ICD-10-CM | POA: Diagnosis not present

## 2021-02-27 DIAGNOSIS — K5790 Diverticulosis of intestine, part unspecified, without perforation or abscess without bleeding: Secondary | ICD-10-CM | POA: Diagnosis not present

## 2021-02-27 DIAGNOSIS — N281 Cyst of kidney, acquired: Secondary | ICD-10-CM | POA: Diagnosis not present

## 2021-02-27 DIAGNOSIS — R1900 Intra-abdominal and pelvic swelling, mass and lump, unspecified site: Secondary | ICD-10-CM

## 2021-02-27 MED ORDER — GADOBENATE DIMEGLUMINE 529 MG/ML IV SOLN
14.0000 mL | Freq: Once | INTRAVENOUS | Status: AC | PRN
  Administered 2021-02-27: 14 mL via INTRAVENOUS

## 2021-02-27 NOTE — Progress Notes (Addendum)
Pt was brought over to nursing area after receiving MR Abdomen with contrast. Per Caryl Pina, MR Tech, pt developed itching and skin appeared red immediatly after scan. Dr. Nelia Shi was notified and saw pt in MR, 50mg  of Benadryl was given through pts peripheral IV, per Dr. Nelia Shi. Upon pts arrival to nursing area pt reports "I already feel much better". Pt denies any breathing issues, shortness of breath. Pts lips and tongue do not appear to be swollen. Vitals obtained. See flowsheet. Will watch patient until discharged by Dr. Nelia Shi. Pts sister is driving pt home today. Allergy listed in chart and contrast reaction card given to the patient. Also advised the patient and her sister if her symptoms were to get worse or return the patient would need to seek medical attention. Patient and her sister both verbalized understanding.

## 2021-03-06 NOTE — Telephone Encounter (Signed)
Please call patient with MRI results.

## 2021-03-07 ENCOUNTER — Telehealth: Payer: Self-pay | Admitting: Internal Medicine

## 2021-03-07 NOTE — Telephone Encounter (Signed)
Patient calling to get her MRI results from 03.15.22

## 2021-03-07 NOTE — Telephone Encounter (Signed)
Results given.

## 2021-03-28 ENCOUNTER — Telehealth (INDEPENDENT_AMBULATORY_CARE_PROVIDER_SITE_OTHER): Payer: Medicare Other | Admitting: Internal Medicine

## 2021-03-28 DIAGNOSIS — M545 Low back pain, unspecified: Secondary | ICD-10-CM | POA: Diagnosis not present

## 2021-03-28 DIAGNOSIS — M62838 Other muscle spasm: Secondary | ICD-10-CM | POA: Diagnosis not present

## 2021-03-28 DIAGNOSIS — R059 Cough, unspecified: Secondary | ICD-10-CM

## 2021-03-28 DIAGNOSIS — J309 Allergic rhinitis, unspecified: Secondary | ICD-10-CM | POA: Diagnosis not present

## 2021-03-28 MED ORDER — CYCLOBENZAPRINE HCL 10 MG PO TABS: 10.0000 mg | ORAL_TABLET | Freq: Three times a day (TID) | ORAL | 2 refills | Status: DC | PRN

## 2021-03-28 MED ORDER — BENZONATATE 200 MG PO CAPS: 200.0000 mg | ORAL_CAPSULE | Freq: Three times a day (TID) | ORAL | 1 refills | Status: DC | PRN

## 2021-03-28 MED ORDER — TRAMADOL HCL 50 MG PO TABS: 50.0000 mg | ORAL_TABLET | Freq: Four times a day (QID) | ORAL | 0 refills | Status: DC | PRN

## 2021-03-28 NOTE — Progress Notes (Signed)
Patient ID: Kristen Howe, female   DOB: 01/27/1946, 75 y.o.   MRN: [id number]  Virtual Visit via Video Note  I connected with Shirlee More on 03/28/21 at  3:20 PM EDT by a video enabled telemedicine application and verified that I am speaking with the correct person using two identifiers.  Location of all participants [place] Patient: at home Provider: at office   I discussed the limitations of evaluation and management by telemedicine and the availability of in person appointments. The patient expressed understanding and agreed to proceed.  History of Present Illness: Here to f/u - Does have several wks ongoing nasal allergy symptoms with clearish congestion, itch and sneezing, without fever, pain, ST, swelling or wheezing but the non prod cough has been mod to severe, intermittent but persistent, worse at night to lie down, and nothing else seems to make worse or better.  On top of this, c/o sudden onset bilateral low back spasms and moderate pain for 3 days, worse to stand up or bend, and without bowel or bladder change, fever, wt loss,  worsening LE pain/numbness/weakness, gait change or falls.   Past Medical History:  Diagnosis Date  . Allergy   . Anemia    as a child  . Anxiety   . Arthritis    "lower back" (01/25/2016)  . Bilateral breast cancer ([place])    Archie Endo 01/25/2016  . Cataract   . Depression   . Esophageal diverticulum   . GERD (gastroesophageal reflux disease)   . Hyperlipidemia   . Hypertension   . Leg cramps    takes Flexeril  . Migraine    "maybe monthly" (01/25/2016)  . Osteoporosis   . Pneumonia ~ 2005 X 1  . Primary cancer of 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 breast ([place]) 12/14/2015  . Rheumatic fever 1950s   Past Surgical History:  Procedure Laterality Date  . BREAST BIOPSY Bilateral 2016  . CATARACT EXTRACTION W/ INTRAOCULAR LENS  IMPLANT, BILATERAL Bilateral 05/2015-06/2015  . CESAREAN SECTION  1974; 1976  . COLONOSCOPY    . ESOPHAGEAL MANOMETRY N/A  03/10/2017   Procedure: ESOPHAGEAL MANOMETRY (EM);  Surgeon: Ladene Artist, MD;  [facility];  Service: Endoscopy;  Laterality: N/A;  . EYE MUSCLE SURGERY Bilateral 1950s   for cross eyes as a child  . INSERTION OF MESH N/A 09/12/2015   Procedure: INSERTION OF MESH;  Surgeon: Erroll Luna, MD;  Location: [facility];  Service: General;  Laterality: N/A;  . MASTECTOMY COMPLETE / SIMPLE W/ SENTINEL NODE BIOPSY Right 01/25/2016  . MASTECTOMY MODIFIED RADICAL Left 01/25/2016  . MASTECTOMY W/ SENTINEL NODE BIOPSY Bilateral 01/25/2016   Procedure: RIGHT MASTECTOMY WITH SENTINEL RIGHT LYMPH NODE BIOPSY, LEFT MODIFIED RADICAL MASTECTOMY;  Surgeon: Stark Klein, MD;  Location: [facility];  Service: General;  Laterality: Bilateral;  . TONSILLECTOMY  1950s  . UMBILICAL HERNIA REPAIR N/A 09/12/2015   Procedure: REPAIR OF INCISIONAL AND UMBILICAL HERNIA;  Surgeon: Erroll Luna, MD;  Location: [facility];  Service: General;  Laterality: N/A;    reports that she quit smoking about 5 years ago. Her smoking use included cigarettes. She smoked 1.00 pack per day for 0.00 years. She has never used smokeless tobacco. She reports that she does not drink alcohol and does not use drugs. family history includes Breast cancer in her cousin, cousin, cousin, cousin, and sister; Cancer in her cousin; Cancer (age of onset: 83) in her maternal grandmother; Diabetes in her father, paternal aunt, and paternal uncle; Gout in her  paternal grandfather and paternal grandmother; Healthy in her mother; Heart Problems in her father; Lung cancer in her cousin and maternal uncle; Lupus in her cousin; Lymphoma in her father; Other in her father and mother; Stroke in her maternal grandfather. Allergies  Allergen Reactions  . Multihance [Gadobenate] Itching, Rash and Other (See Comments)    Pt developed immediate itching, red eyes and rash after MR contrast. Subsided with 25 mg of benadryl. Pt will need 13 hr prep prior to future studies. 02/27/21    Current Outpatient Medications on File Prior to Visit  Medication Sig Dispense Refill  . ACCU-CHEK GUIDE test strip USE AS DIRECTED ONCE DAILY 100 each 0  . albuterol (VENTOLIN HFA) 108 (90 Base) MCG/ACT inhaler Inhale 2 puffs into the lungs every 6 (six) hours as needed for wheezing or shortness of breath. 18 g 11  . ALPRAZolam (XANAX) 0.5 MG tablet Take 1 tablet 1 hour prior to your MRI test.  Can repeat in 1 hour if no effect. 2 tablet 0  . anastrozole (ARIMIDEX) 1 MG tablet Take 1 tablet by mouth once daily 90 tablet 0  . Artificial Tear Ointment (DRY EYES OP) Apply 1 drop to eye daily as needed (for dry eyes).     Marland Kitchen atorvastatin (LIPITOR) 10 MG tablet Take 1 tablet by mouth once daily 90 tablet 0  . Calcium-Phosphorus-Vitamin D (CITRACAL +D3 PO) Take 2 tablets by mouth 2 (two) times daily.     . Fluticasone-Salmeterol (ADVAIR) 250-50 MCG/DOSE AEPB Inhale 1 puff into the lungs in the morning and at bedtime. 60 each 11  . hydrochlorothiazide (HYDRODIURIL) 25 MG tablet Take 1 tablet by mouth once daily 90 tablet 0  . ibuprofen (ADVIL) 800 MG tablet Take 800 mg by mouth every 6 (six) hours as needed.    Marland Kitchen ibuprofen (ADVIL,MOTRIN) 200 MG tablet Take 400 mg by mouth 2 (two) times daily as needed for moderate pain.    Marland Kitchen losartan (COZAAR) 50 MG tablet Take 1 tablet (50 mg total) by mouth daily. 90 tablet 0  . Multiple Vitamins-Minerals (MULTIVITAMIN WITH MINERALS) tablet Take 1 tablet by mouth daily. Reported on 12/20/2015    . pantoprazole (PROTONIX) 40 MG tablet TAKE 1 TABLET BY MOUTH TWICE DAILY BEFORE MEAL(S) 60 tablet 11  . venlafaxine XR (EFFEXOR-XR) 75 MG 24 hr capsule TAKE 1 CAPSULE BY MOUTH ONCE DAILY WITH BREAKFAST 90 capsule 0  . zolpidem (AMBIEN) 5 MG tablet Take 1 tablet (5 mg total) by mouth at bedtime as needed for sleep. 30 tablet 5   No current facility-administered medications on file prior to visit.  Observations/Objective: Alert, NAD, appropriate mood and affect, resps normal,  cn 2-12 intact, moves all 4s, no visible rash or swelling Lab Results  Component Value Date   WBC 6.7 02/05/2021   HGB 13.0 02/05/2021   HCT 38.2 02/05/2021   PLT 265.0 02/05/2021   GLUCOSE 95 02/05/2021   CHOL 135 02/05/2021   TRIG 162.0 (H) 02/05/2021   HDL 39.70 02/05/2021   LDLDIRECT 137.0 06/17/2018   LDLCALC 63 02/05/2021   ALT 28 02/05/2021   AST 17 02/05/2021   NA 141 02/05/2021   K 4.3 02/05/2021   CL 103 02/05/2021   CREATININE 0.90 02/05/2021   BUN 21 02/05/2021   CO2 28 02/05/2021   TSH 3.28 02/05/2021   HGBA1C 6.6 (H) 02/05/2021   MICROALBUR <0.7 02/05/2021   Assessment and Plan: See notes  Follow Up Instructions: See notes   I discussed the  assessment and treatment plan with the patient. The patient was provided an opportunity to ask questions and all were answered. The patient agreed with the plan and demonstrated an understanding of the instructions.   The patient was advised to call back or seek an in-person evaluation if the symptoms worsen or if the condition fails to improve as anticipated.   Cathlean Cower, MD

## 2021-03-31 ENCOUNTER — Encounter: Payer: Self-pay | Admitting: Internal Medicine

## 2021-03-31 DIAGNOSIS — M545 Low back pain, unspecified: Secondary | ICD-10-CM | POA: Insufficient documentation

## 2021-03-31 DIAGNOSIS — J309 Allergic rhinitis, unspecified: Secondary | ICD-10-CM | POA: Insufficient documentation

## 2021-03-31 NOTE — Patient Instructions (Signed)
Please take all new medication as prescribed 

## 2021-03-31 NOTE — Assessment & Plan Note (Signed)
New onset, non chronic likely related to incessant coughing and msk spasm - for tessalon perle prn, but also tramadol prn and flexeril prn,  to f/u any worsening symptoms or concerns

## 2021-03-31 NOTE — Assessment & Plan Note (Signed)
Likely relate to post nasal gtt and allergic rhinitis - for limited course prn tessalon perle

## 2021-03-31 NOTE — Assessment & Plan Note (Signed)
Pt encouraged for otc allegra and nasacort for improved symptoms control,  to f/u any worsening symptoms or concerns

## 2021-04-03 ENCOUNTER — Encounter: Payer: Self-pay | Admitting: Internal Medicine

## 2021-04-03 ENCOUNTER — Ambulatory Visit (INDEPENDENT_AMBULATORY_CARE_PROVIDER_SITE_OTHER): Payer: Medicare Other | Admitting: Internal Medicine

## 2021-04-03 ENCOUNTER — Other Ambulatory Visit: Payer: Self-pay

## 2021-04-03 ENCOUNTER — Other Ambulatory Visit: Payer: Self-pay | Admitting: Internal Medicine

## 2021-04-03 ENCOUNTER — Telehealth: Payer: Self-pay | Admitting: Internal Medicine

## 2021-04-03 DIAGNOSIS — E785 Hyperlipidemia, unspecified: Secondary | ICD-10-CM

## 2021-04-03 DIAGNOSIS — I10 Essential (primary) hypertension: Secondary | ICD-10-CM | POA: Diagnosis not present

## 2021-04-03 DIAGNOSIS — M545 Low back pain, unspecified: Secondary | ICD-10-CM | POA: Diagnosis not present

## 2021-04-03 DIAGNOSIS — E1165 Type 2 diabetes mellitus with hyperglycemia: Secondary | ICD-10-CM | POA: Diagnosis not present

## 2021-04-03 MED ORDER — PREDNISONE 10 MG PO TABS: ORAL_TABLET | ORAL | 0 refills | Status: DC

## 2021-04-03 MED ORDER — HYDROCODONE-ACETAMINOPHEN 7.5-325 MG PO TABS: 1.0000 | ORAL_TABLET | Freq: Four times a day (QID) | ORAL | 0 refills | Status: DC | PRN

## 2021-04-03 MED ORDER — METHOCARBAMOL 500 MG PO TABS: 500.0000 mg | ORAL_TABLET | Freq: Four times a day (QID) | ORAL | 1 refills | Status: DC | PRN

## 2021-04-03 NOTE — Progress Notes (Signed)
Patient ID: Kristen Howe, female   DOB: Apr 20, 1946, 75 y.o.   MRN: [id number]        Chief Complaint: LBP       HPI:  Kristen Howe is a 75 y.o. female here with c/o 1 wk onset bilateral lower back pain, hard to localize across the back and some minor pain to the right thigh not clear if related, but o/w Pt denies bowel or bladder change, fever, wt loss,  worsening LE pain/numbness/weakness, gait change or falls. Overall mod to severe, constant, worse to stand up or bend.  Has known underlying lumbar DJD/DDD.  Flexeril not working  Pt denies chest pain, increased sob or doe, wheezing, orthopnea, PND, increased LE swelling, palpitations, dizziness or syncope.   Pt denies polydipsia, polyuria       Wt Readings from Last 3 Encounters:  02/05/21 154 lb (69.9 kg)  09/12/20 150 lb 9.6 oz (68.3 kg)  08/03/20 150 lb (68 kg)   BP Readings from Last 3 Encounters:  04/03/21 122/80  02/27/21 127/61  02/05/21 116/78         Past Medical History:  Diagnosis Date  . Allergy   . Anemia    as a child  . Anxiety   . Arthritis    "lower back" (01/25/2016)  . Bilateral breast cancer ([place])    [facility] Endo 01/25/2016  . Cataract   . Depression   . Esophageal diverticulum   . GERD (gastroesophageal reflux disease)   . Hyperlipidemia   . Hypertension   . Leg cramps    takes Flexeril  . Migraine    "maybe monthly" (01/25/2016)  . Osteoporosis   . Pneumonia ~ 2005 X 1  . Primary cancer of 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 breast ([place]) 12/14/2015  . Rheumatic fever 1950s   Past Surgical History:  Procedure Laterality Date  . BREAST BIOPSY Bilateral 2016  . CATARACT EXTRACTION W/ INTRAOCULAR LENS  IMPLANT, BILATERAL Bilateral 05/2015-06/2015  . CESAREAN SECTION  1974; 1976  . COLONOSCOPY    . ESOPHAGEAL MANOMETRY N/A 03/10/2017   Procedure: ESOPHAGEAL MANOMETRY (EM);  Surgeon: Ladene Artist, MD;  Location: [facility];  Service: Endoscopy;  Laterality: N/A;  . EYE MUSCLE SURGERY Bilateral 1950s   for  cross eyes as a child  . INSERTION OF MESH N/A 09/12/2015   Procedure: INSERTION OF MESH;  Surgeon: Erroll Luna, MD;  Location: [facility];  Service: General;  Laterality: N/A;  . MASTECTOMY COMPLETE / SIMPLE W/ SENTINEL NODE BIOPSY Right 01/25/2016  . MASTECTOMY MODIFIED RADICAL Left 01/25/2016  . MASTECTOMY W/ SENTINEL NODE BIOPSY Bilateral 01/25/2016   Procedure: RIGHT MASTECTOMY WITH SENTINEL RIGHT LYMPH NODE BIOPSY, LEFT MODIFIED RADICAL MASTECTOMY;  Surgeon: Stark Klein, MD;  Location: [facility];  Service: General;  Laterality: Bilateral;  . TONSILLECTOMY  1950s  . UMBILICAL HERNIA REPAIR N/A 09/12/2015   Procedure: REPAIR OF INCISIONAL AND UMBILICAL HERNIA;  Surgeon: Erroll Luna, MD;  Location: [facility];  Service: General;  Laterality: N/A;    reports that she quit smoking about 5 years ago. Her smoking use included cigarettes. She smoked 1.00 pack per day for 0.00 years. She has never used smokeless tobacco. She reports that she does not drink alcohol and does not use drugs. family history includes Breast cancer in her cousin, cousin, cousin, cousin, and sister; Cancer in her cousin; Cancer (age of onset: 31) in her maternal grandmother; Diabetes in her father, paternal aunt, and paternal uncle; Gout in her paternal grandfather and  paternal grandmother; Healthy in her mother; Heart Problems in her father; Lung cancer in her cousin and maternal uncle; Lupus in her cousin; Lymphoma in her father; Other in her father and mother; Stroke in her maternal grandfather. Allergies  Allergen Reactions  . Multihance [Gadobenate] Itching, Rash and Other (See Comments)    Pt developed immediate itching, red eyes and rash after MR contrast. Subsided with 25 mg of benadryl. Pt will need 13 hr prep prior to future studies. 02/27/21   Current Outpatient Medications on File Prior to Visit  Medication Sig Dispense Refill  . ACCU-CHEK GUIDE test strip USE AS DIRECTED ONCE DAILY 100 each 0  . albuterol (VENTOLIN HFA) 108  (90 Base) MCG/ACT inhaler Inhale 2 puffs into the lungs every 6 (six) hours as needed for wheezing or shortness of breath. 18 g 11  . ALPRAZolam (XANAX) 0.5 MG tablet Take 1 tablet 1 hour prior to your MRI test.  Can repeat in 1 hour if no effect. 2 tablet 0  . anastrozole (ARIMIDEX) 1 MG tablet Take 1 tablet by mouth once daily 90 tablet 0  . Artificial Tear Ointment (DRY EYES OP) Apply 1 drop to eye daily as needed (for dry eyes).     . benzonatate (TESSALON) 200 MG capsule Take 1 capsule (200 mg total) by mouth 3 (three) times daily as needed for cough. 40 capsule 1  . Calcium-Phosphorus-Vitamin D (CITRACAL +D3 PO) Take 2 tablets by mouth 2 (two) times daily.     . Fluticasone-Salmeterol (ADVAIR) 250-50 MCG/DOSE AEPB Inhale 1 puff into the lungs in the morning and at bedtime. 60 each 11  . hydrochlorothiazide (HYDRODIURIL) 25 MG tablet Take 1 tablet by mouth once daily 90 tablet 0  . ibuprofen (ADVIL) 800 MG tablet Take 800 mg by mouth every 6 (six) hours as needed.    Marland Kitchen ibuprofen (ADVIL,MOTRIN) 200 MG tablet Take 400 mg by mouth 2 (two) times daily as needed for moderate pain.    Marland Kitchen losartan (COZAAR) 50 MG tablet Take 1 tablet (50 mg total) by mouth daily. 90 tablet 0  . Multiple Vitamins-Minerals (MULTIVITAMIN WITH MINERALS) tablet Take 1 tablet by mouth daily. Reported on 12/20/2015    . pantoprazole (PROTONIX) 40 MG tablet TAKE 1 TABLET BY MOUTH TWICE DAILY BEFORE MEAL(S) 60 tablet 11  . traMADol (ULTRAM) 50 MG tablet Take 1 tablet (50 mg total) by mouth every 6 (six) hours as needed. 30 tablet 0  . venlafaxine XR (EFFEXOR-XR) 75 MG 24 hr capsule TAKE 1 CAPSULE BY MOUTH ONCE DAILY WITH BREAKFAST 90 capsule 0  . zolpidem (AMBIEN) 5 MG tablet Take 1 tablet (5 mg total) by mouth at bedtime as needed for sleep. 30 tablet 5   No current facility-administered medications on file prior to visit.        ROS:  All others reviewed and negative.  Objective        PE:  BP 122/80 (BP Location: Left  Arm, Patient Position: Sitting, Cuff Size: Large)   Pulse 85   Temp 98.7 F (37.1 C) (Oral)   Ht 5' (1.524 m)   SpO2 96%   BMI 30.08 kg/m                 Constitutional: Pt appears in NAD               HENT: Head: NCAT.                Right Ear: External ear normal.  Left Ear: External ear normal.                Eyes: . Pupils are equal, round, and reactive to light. Conjunctivae and EOM are normal               Nose: without d/c or deformity               Neck: Neck supple. Gross normal ROM               Cardiovascular: Normal rate and regular rhythm.                 Pulmonary/Chest: Effort normal and breath sounds without rales or wheezing.                Abd:  Soft, NT, ND, + BS, no organomegaly               Neurological: Pt is alert. At baseline orientation, motor grossly intact; spine nontender in the midline               Skin: Skin is warm. No rashes, no other new lesions, LE edema - none               Psychiatric: Pt behavior is normal without agitation   Micro: none  Cardiac tracings I have personally interpreted today:  none  Pertinent Radiological findings (summarize): none   Lab Results  Component Value Date   WBC 6.7 02/05/2021   HGB 13.0 02/05/2021   HCT 38.2 02/05/2021   PLT 265.0 02/05/2021   GLUCOSE 95 02/05/2021   CHOL 135 02/05/2021   TRIG 162.0 (H) 02/05/2021   HDL 39.70 02/05/2021   LDLDIRECT 137.0 06/17/2018   LDLCALC 63 02/05/2021   ALT 28 02/05/2021   AST 17 02/05/2021   NA 141 02/05/2021   K 4.3 02/05/2021   CL 103 02/05/2021   CREATININE 0.90 02/05/2021   BUN 21 02/05/2021   CO2 28 02/05/2021   TSH 3.28 02/05/2021   HGBA1C 6.6 (H) 02/05/2021   MICROALBUR <0.7 02/05/2021   Assessment/Plan:  Nyrie Sigal is a 75 y.o. White or Caucasian [1] female with  has a past medical history of Allergy, Anemia, Anxiety, Arthritis, Bilateral breast cancer ([place]), Cataract, Depression, Esophageal diverticulum, GERD (gastroesophageal  reflux disease), Hyperlipidemia, Hypertension, Leg cramps, Migraine, Osteoporosis, Pneumonia (~ 2005 X 1), Primary cancer of 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 breast ([place]) (12/14/2015), and Rheumatic fever (1950s).  Low back pain C/w likley flare of underlying lumbar djd/ddd - for hydrocodone prn, robaxin prn, predpac asd, and salon paz prn,  to f/u any worsening symptoms or concerns; consider MRI for any worsening  Essential hypertension BP Readings from Last 3 Encounters:  04/03/21 122/80  02/27/21 127/61  02/05/21 116/78   Stable, pt to continue medical treatment losartan   Diabetes mellitus ([place]) Lab Results  Component Value Date   HGBA1C 6.6 (H) 02/05/2021   Stable, pt to continue current medical treatment  - diet   Followup: Return if symptoms worsen or fail to improve.  Cathlean Cower, MD 04/08/2021 8:22 PM [facility]

## 2021-04-03 NOTE — Patient Instructions (Signed)
Please take all new medication as prescribed - the hydrocodone as needed for pain, and the prednisone  We can also change the flexeril to robaxin for the muscle relaxer  Please also use the OTC Marlene Lard which is an OTC lidocaine patch  Please continue all other medications as before, and refills have been done if requested.  Please have the pharmacy call with any other refills you may need.  Please keep your appointments with your specialists as you may have planned  Please return in 3 wks if not improved, or sooner if you are worse

## 2021-04-03 NOTE — Telephone Encounter (Signed)
[facility] has called asking about the predniSONE (DELTASONE) 10 MG tablet  Should the prescription be corrected to say:  " 2 tabs by mouth per day for 3 days,1tabs per day for 3 days,1tab per day for 3 days"  Or should there be 15 instead of 18 tablets  Please advise [phone]

## 2021-04-08 ENCOUNTER — Encounter: Payer: Self-pay | Admitting: Internal Medicine

## 2021-04-08 NOTE — Assessment & Plan Note (Signed)
C/w likley flare of underlying lumbar djd/ddd - for hydrocodone prn, robaxin prn, predpac asd, and salon paz prn,  to f/u any worsening symptoms or concerns; consider MRI for any worsening

## 2021-04-08 NOTE — Assessment & Plan Note (Signed)
Lab Results  Component Value Date   HGBA1C 6.6 (H) 02/05/2021   Stable, pt to continue current medical treatment  - diet

## 2021-04-08 NOTE — Assessment & Plan Note (Signed)
BP Readings from Last 3 Encounters:  04/03/21 122/80  02/27/21 127/61  02/05/21 116/78   Stable, pt to continue medical treatment losartan

## 2021-04-11 ENCOUNTER — Other Ambulatory Visit: Payer: Self-pay | Admitting: Oncology

## 2021-04-11 DIAGNOSIS — F419 Anxiety disorder, unspecified: Secondary | ICD-10-CM

## 2021-04-11 DIAGNOSIS — F321 Major depressive disorder, single episode, moderate: Secondary | ICD-10-CM

## 2021-04-22 ENCOUNTER — Other Ambulatory Visit: Payer: Self-pay | Admitting: Oncology

## 2021-04-24 ENCOUNTER — Telehealth: Payer: Self-pay | Admitting: Oncology

## 2021-04-24 NOTE — Telephone Encounter (Signed)
R/s appt per 5/8 sch msg. Pt aware.  

## 2021-05-13 ENCOUNTER — Other Ambulatory Visit: Payer: Self-pay | Admitting: Internal Medicine

## 2021-05-13 DIAGNOSIS — I10 Essential (primary) hypertension: Secondary | ICD-10-CM

## 2021-05-13 NOTE — Telephone Encounter (Signed)
Please refill as per office routine med refill policy (all routine meds refilled for 3 mo or monthly per pt preference up to one year from last visit, then month to month grace period for 3 mo, then further med refills will have to be denied)  

## 2021-05-15 ENCOUNTER — Other Ambulatory Visit: Payer: Self-pay | Admitting: Internal Medicine

## 2021-05-15 DIAGNOSIS — I10 Essential (primary) hypertension: Secondary | ICD-10-CM

## 2021-05-24 ENCOUNTER — Emergency Department (HOSPITAL_COMMUNITY): Payer: Medicare Other

## 2021-05-24 ENCOUNTER — Emergency Department (HOSPITAL_COMMUNITY)
Admission: EM | Admit: 2021-05-24 | Discharge: 2021-05-24 | Disposition: A | Discharge status: Home / Self Care | Payer: Medicare Other | Attending: Emergency Medicine | Admitting: Emergency Medicine

## 2021-05-24 ENCOUNTER — Encounter (HOSPITAL_COMMUNITY): Payer: Self-pay | Admitting: Emergency Medicine

## 2021-05-24 ENCOUNTER — Other Ambulatory Visit: Payer: Self-pay

## 2021-05-24 DIAGNOSIS — Z87891 Personal history of nicotine dependence: Secondary | ICD-10-CM | POA: Insufficient documentation

## 2021-05-24 DIAGNOSIS — S40212A Abrasion of left shoulder, initial encounter: Secondary | ICD-10-CM | POA: Diagnosis not present

## 2021-05-24 DIAGNOSIS — Z853 Personal history of malignant neoplasm of breast: Secondary | ICD-10-CM | POA: Diagnosis not present

## 2021-05-24 DIAGNOSIS — S0101XA Laceration without foreign body of scalp, initial encounter: Secondary | ICD-10-CM

## 2021-05-24 DIAGNOSIS — I10 Essential (primary) hypertension: Secondary | ICD-10-CM | POA: Diagnosis not present

## 2021-05-24 DIAGNOSIS — M542 Cervicalgia: Secondary | ICD-10-CM | POA: Diagnosis not present

## 2021-05-24 DIAGNOSIS — S0990XA Unspecified injury of head, initial encounter: Secondary | ICD-10-CM

## 2021-05-24 DIAGNOSIS — E119 Type 2 diabetes mellitus without complications: Secondary | ICD-10-CM | POA: Diagnosis not present

## 2021-05-24 DIAGNOSIS — Z79899 Other long term (current) drug therapy: Secondary | ICD-10-CM | POA: Diagnosis not present

## 2021-05-24 DIAGNOSIS — S199XXA Unspecified injury of neck, initial encounter: Secondary | ICD-10-CM | POA: Diagnosis not present

## 2021-05-24 DIAGNOSIS — Z7951 Long term (current) use of inhaled steroids: Secondary | ICD-10-CM | POA: Diagnosis not present

## 2021-05-24 DIAGNOSIS — M48061 Spinal stenosis, lumbar region without neurogenic claudication: Secondary | ICD-10-CM | POA: Diagnosis not present

## 2021-05-24 DIAGNOSIS — M545 Low back pain, unspecified: Secondary | ICD-10-CM | POA: Diagnosis not present

## 2021-05-24 DIAGNOSIS — Z043 Encounter for examination and observation following other accident: Secondary | ICD-10-CM | POA: Diagnosis not present

## 2021-05-24 DIAGNOSIS — G319 Degenerative disease of nervous system, unspecified: Secondary | ICD-10-CM | POA: Diagnosis not present

## 2021-05-24 DIAGNOSIS — M47816 Spondylosis without myelopathy or radiculopathy, lumbar region: Secondary | ICD-10-CM | POA: Diagnosis not present

## 2021-05-24 DIAGNOSIS — W109XXA Fall (on) (from) unspecified stairs and steps, initial encounter: Secondary | ICD-10-CM | POA: Insufficient documentation

## 2021-05-24 DIAGNOSIS — J45909 Unspecified asthma, uncomplicated: Secondary | ICD-10-CM | POA: Diagnosis not present

## 2021-05-24 DIAGNOSIS — W19XXXA Unspecified fall, initial encounter: Secondary | ICD-10-CM

## 2021-05-24 DIAGNOSIS — M19012 Primary osteoarthritis, left shoulder: Secondary | ICD-10-CM | POA: Diagnosis not present

## 2021-05-24 MED ORDER — LIDOCAINE-EPINEPHRINE 2 %-1:100000 IJ SOLN
20.0000 mL | Freq: Once | INTRAMUSCULAR | Status: AC
  Administered 2021-05-24: 20 mL via INTRADERMAL
  Filled 2021-05-24: qty 1

## 2021-05-24 MED ORDER — ACETAMINOPHEN 325 MG PO TABS
650.0000 mg | ORAL_TABLET | Freq: Once | ORAL | Status: AC
  Administered 2021-05-24: 650 mg via ORAL
  Filled 2021-05-24: qty 2

## 2021-05-24 MED ORDER — LIDOCAINE-EPINEPHRINE-TETRACAINE (LET) TOPICAL GEL
3.0000 mL | Freq: Once | TOPICAL | Status: AC
  Administered 2021-05-24: 3 mL via TOPICAL
  Filled 2021-05-24: qty 3

## 2021-05-24 NOTE — ED Provider Notes (Signed)
[facility] DEPT Provider Note   CSN: [id number] Arrival date & time: 05/24/21  1222     History Chief Complaint  Patient presents with   Kristen Howe    Kristen Howe is a 75 y.o. female.  HPI  75 year old female with history of allergies, anemia, anxiety, arthritis, breast cancer, depression, GERD, hyperlipidemia, hypertension, leg cramps, migraines, osteoporosis, pneumonia, rheumatic fever, who presents to the emergency department today for evaluation of a fall.  Patient was trying to move a small refrigerator down the stairs when she lost her balance and fell down about 10-12 steps.  States she hit her head does not believe she lost consciousness.  Her sister at bedside states that she is not responding normally after the fall.  Patient is complaining of a headache, neck pain, wound to the head, left shoulder pain and lower back pain.  She is not anticoagulated.  Has had no episodes of vomiting.  Denies any chest pain, shortness of breath or abdominal pain from the fall. Tdap was updated 6 months ago.   Past Medical History:  Diagnosis Date   Allergy    Anemia    as a child   Anxiety    Arthritis    "lower back" (01/25/2016)   Bilateral breast cancer ([place])    Archie Endo 01/25/2016   Cataract    Depression    Esophageal diverticulum    GERD (gastroesophageal reflux disease)    Hyperlipidemia    Hypertension    Leg cramps    takes Flexeril   Migraine    "maybe monthly" (01/25/2016)   Osteoporosis    Pneumonia ~ 2005 X 1   Primary cancer of 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 breast ([place]) 12/14/2015   Rheumatic fever 1950s    Patient Active Problem List   Diagnosis Date Noted   Allergic rhinitis 03/31/2021   Low back pain 03/31/2021   Aortic atherosclerosis ([place]) 02/05/2021   Acquired complex cyst of kidney 02/05/2021   Encounter for well adult exam with abnormal findings 02/05/2021   Insomnia 02/05/2021   RUQ pain 08/03/2020   History of colon  polyps 08/03/2020   Cough variant asthma 07/07/2019   Cough 05/05/2019   Diabetes mellitus ([place]) 10/07/2017   Patellofemoral pain syndrome of both knees 10/03/2017   Anxiety 09/29/2017   Current moderate episode of major depressive disorder ([place]) 09/29/2017   Esophageal diverticulum 04/14/2017   Esophageal motility disorder 04/14/2017   Esophageal dysphagia    Hypertriglyceridemia 09/03/2016   Medicare annual wellness visit, subsequent 09/03/2016   Overweight (BMI 25.0-29.9) 09/03/2016   Osteoporosis 08/28/2016   Bilateral breast cancer ([place]) 01/25/2016   Tobacco abuse 12/22/2015   Malignant neoplasm of upper-inner quadrant of left breast in female, estrogen receptor positive ([place]) 12/14/2015   Lump in female breast 68/34/1962   Umbilical hernia without obstruction and without gangrene 07/28/2015   Essential hypertension 07/28/2015   Seborrheic keratosis 07/28/2015    Past Surgical History:  Procedure Laterality Date   BREAST BIOPSY Bilateral 2016   CATARACT EXTRACTION W/ INTRAOCULAR LENS  IMPLANT, BILATERAL Bilateral 05/2015-06/2015   CESAREAN SECTION  1974; 1976   COLONOSCOPY     ESOPHAGEAL MANOMETRY N/A 03/10/2017   Procedure: ESOPHAGEAL MANOMETRY (EM);  Surgeon: Ladene Artist, MD;  Location: [facility];  Service: Endoscopy;  Laterality: N/A;   EYE MUSCLE SURGERY Bilateral 1950s   for cross eyes as a child   INSERTION OF MESH N/A 09/12/2015   Procedure: INSERTION OF MESH;  Surgeon: Marcello Moores  Cornett, MD;  Location: [facility];  Service: General;  Laterality: N/A;   MASTECTOMY COMPLETE / SIMPLE W/ SENTINEL NODE BIOPSY Right 01/25/2016   MASTECTOMY MODIFIED RADICAL Left 01/25/2016   MASTECTOMY W/ SENTINEL NODE BIOPSY Bilateral 01/25/2016   Procedure: RIGHT MASTECTOMY WITH SENTINEL RIGHT LYMPH NODE BIOPSY, LEFT MODIFIED RADICAL MASTECTOMY;  Surgeon: Stark Klein, MD;  [facility];  Service: General;  Laterality: Bilateral;   TONSILLECTOMY  3267T   UMBILICAL HERNIA REPAIR N/A 09/12/2015    Procedure: REPAIR OF INCISIONAL AND UMBILICAL HERNIA;  Surgeon: Erroll Luna, MD;  Location: [facility];  Service: General;  Laterality: N/A;     OB History   No obstetric history on file.     Family History  Problem Relation Age of Onset   Healthy Mother    Other Mother        +"throat polyps"; +smoker   Lymphoma Father        d. 70   Diabetes Father    Heart Problems Father    Other Father        "tumors on his kidneys"   Breast cancer Sister        dx. metachronous bilateral breast cancers at 18 and 77; - BRCA1/2 testing   Cancer Maternal Grandmother 56       dx. spinal cancer   Stroke Maternal Grandfather    Gout Paternal Grandmother    Gout Paternal Grandfather    Lung cancer Maternal Uncle        d. 72-72; +smoker   Diabetes Paternal Aunt    Diabetes Paternal Uncle    Breast cancer Cousin        maternal 1st cousin dx. unspecified age   Lung cancer Cousin        maternal 1st cousin dx. lung cancer; +smoker   Lupus Cousin    Breast cancer Cousin        paternal 1st cousin dx. 64s   Breast cancer Cousin        paternal 1st cousin dx. 87s-30s   Cancer Cousin        paternal 1st cousin dx. oral cancer with metastasis, also "lost a leg to cancer"; dx. 94s; +EtOH abuse   Breast cancer Cousin        paternal 1st cousin, once-removed dx. at unspecified age   Colon cancer Neg Hx    Esophageal cancer Neg Hx    Rectal cancer Neg Hx    Stomach cancer Neg Hx     Social History   Tobacco Use   Smoking status: Former    Packs/day: 1.00    Years: 0.00    Pack years: 0.00    Types: Cigarettes    Quit date: 12/24/2015    Years since quitting: 5.4   Smokeless tobacco: Never  Vaping Use   Vaping Use: Never used  Substance Use Topics   Alcohol use: No    Alcohol/week: 0.0 standard drinks    Comment: 07/31/16 pt states she dosn't drink anymore,04/16/2016 "1/2 bottle wine per month" previously - not much now   Drug use: No    Home Medications Prior to Admission  medications   Medication Sig Start Date End Date Taking? Authorizing Provider  losartan (COZAAR) 50 MG tablet Take 1 tablet by mouth once daily 05/15/21   Biagio Borg, MD  ACCU-CHEK GUIDE test strip USE AS DIRECTED ONCE DAILY 11/10/20   Gardenia Phlegm, NP  albuterol (VENTOLIN HFA) 108 (90 Base) MCG/ACT inhaler Inhale  2 puffs into the lungs every 6 (six) hours as needed for wheezing or shortness of breath. 08/03/20   Biagio Borg, MD  ALPRAZolam Duanne Moron) 0.5 MG tablet Take 1 tablet 1 hour prior to your MRI test.  Can repeat in 1 hour if no effect. 02/23/21   Plotnikov, Evie Lacks, MD  anastrozole (ARIMIDEX) 1 MG tablet Take 1 tablet by mouth once daily 02/12/21   Magrinat, Virgie Dad, MD  Artificial Tear Ointment (DRY EYES OP) Apply 1 drop to eye daily as needed (for dry eyes).     [provider]  atorvastatin (LIPITOR) 10 MG tablet Take 1 tablet by mouth once daily 04/03/21   Biagio Borg, MD  benzonatate (TESSALON) 200 MG capsule Take 1 capsule (200 mg total) by mouth 3 (three) times daily as needed for cough. 03/28/21   Biagio Borg, MD  Calcium-Phosphorus-Vitamin D (CITRACAL +D3 PO) Take 2 tablets by mouth 2 (two) times daily.     [provider]  Fluticasone-Salmeterol (ADVAIR) 250-50 MCG/DOSE AEPB Inhale 1 puff into the lungs in the morning and at bedtime. 08/03/20   Biagio Borg, MD  hydrochlorothiazide (HYDRODIURIL) 25 MG tablet Take 1 tablet by mouth once daily 02/13/21   Biagio Borg, MD  HYDROcodone-acetaminophen (NORCO) 7.5-325 MG tablet Take 1 tablet by mouth every 6 (six) hours as needed for moderate pain. 04/03/21   Biagio Borg, MD  ibuprofen (ADVIL) 800 MG tablet Take 800 mg by mouth every 6 (six) hours as needed. 08/28/20   [provider]  ibuprofen (ADVIL,MOTRIN) 200 MG tablet Take 400 mg by mouth 2 (two) times daily as needed for moderate pain.    [provider]  methocarbamol (ROBAXIN) 500 MG tablet Take 1 tablet (500 mg total) by mouth  every 6 (six) hours as needed for muscle spasms. 04/03/21   Biagio Borg, MD  Multiple Vitamins-Minerals (MULTIVITAMIN WITH MINERALS) tablet Take 1 tablet by mouth daily. Reported on 12/20/2015    [provider]  pantoprazole (PROTONIX) 40 MG tablet TAKE 1 TABLET BY MOUTH TWICE DAILY BEFORE MEAL(S) 06/16/20   Biagio Borg, MD  predniSONE (DELTASONE) 10 MG tablet 3 tabs by mouth per day for 3 days,2tabs per day for 3 days,1tab per day for 3 days 04/03/21   Biagio Borg, MD  traMADol (ULTRAM) 50 MG tablet Take 1 tablet (50 mg total) by mouth every 6 (six) hours as needed. 03/28/21   Biagio Borg, MD  venlafaxine XR (EFFEXOR-XR) 75 MG 24 hr capsule TAKE 1 CAPSULE BY MOUTH ONCE DAILY WITH BREAKFAST 04/11/21   Magrinat, Virgie Dad, MD  zolpidem (AMBIEN) 5 MG tablet Take 1 tablet (5 mg total) by mouth at bedtime as needed for sleep. 02/05/21 02/05/22  Biagio Borg, MD    Allergies    Multihance [gadobenate]  Review of Systems   Review of Systems  Constitutional:  Negative for fever.  HENT:  Negative for dental problem.   Eyes:  Negative for visual disturbance.  Respiratory:  Negative for shortness of breath.   Cardiovascular:  Negative for chest pain.  Gastrointestinal:  Negative for abdominal pain, nausea and vomiting.  Genitourinary:  Negative for flank pain.  Musculoskeletal:  Positive for back pain and neck pain.  Skin:  Positive for wound.  Neurological:  Negative for weakness and numbness.       Head injury, no loc   Physical Exam Updated Vital Signs BP 124/71 (BP Location: Right Arm)  Pulse (!) 56   Temp 98.8 F (37.1 C) (Oral)   Resp 16   SpO2 96%   Physical Exam Vitals and nursing note reviewed.  Constitutional:      General: She is not in acute distress.    Appearance: She is well-developed.  HENT:     Head: Normocephalic.     Comments: 0.5 cm laceration noted to the left parietal scalp Eyes:     Extraocular Movements: Extraocular movements intact.      Conjunctiva/sclera: Conjunctivae normal.     Pupils: Pupils are equal, round, and reactive to light.  Cardiovascular:     Rate and Rhythm: Normal rate and regular rhythm.     Heart sounds: Normal heart sounds. No murmur heard. Pulmonary:     Effort: Pulmonary effort is normal. No respiratory distress.     Breath sounds: Normal breath sounds. No wheezing or rales.  Chest:     Chest wall: No tenderness.  Abdominal:     General: Bowel sounds are normal.     Palpations: Abdomen is soft.     Tenderness: There is no abdominal tenderness. There is no guarding.  Musculoskeletal:     Cervical back: Neck supple.     Comments: TTP to the cervical and lumbar spine. TTP to the left shoulder with superficial abrasion noted posteriorly  Skin:    General: Skin is warm and dry.  Neurological:     Mental Status: She is alert.     Comments: Mental Status:  Alert, thought content appropriate, able to give a coherent history. Speech fluent without evidence of aphasia. Able to follow 2 step commands without difficulty.  Cranial Nerves:  II: round, reactive to light III,IV, VI: ptosis not present, extra-ocular motions intact bilaterally  V,VII: smile symmetric, facial light touch sensation equal VIII: hearing grossly normal to voice  X: uvula elevates symmetrically  XI: bilateral shoulder shrug symmetric and strong XII: midline tongue extension without fassiculations Motor:  Normal tone. 5/5 strength of BUE and BLE major muscle groups including strong and equal grip strength and dorsiflexion/plantar flexion Sensory: light touch normal in all extremities.     ED Results / Procedures / Treatments   Labs (all labs ordered are listed, but only abnormal results are displayed) Labs Reviewed - No data to display  EKG None  Radiology DG Lumbar Spine Complete  Result Date: 05/24/2021 CLINICAL DATA:  Status post fall. EXAM: LUMBAR SPINE - COMPLETE 4+ VIEW COMPARISON:  None. FINDINGS: There is no  evidence of an acute lumbar spine fracture. Approximately 1 mm anterolisthesis of the L4 vertebral body is noted on L5. There is mild multilevel endplate sclerosis with marked severity intervertebral disc space narrowing seen at L5-S1. Marked severity calcification of the abdominal aorta is noted. IMPRESSION: Multilevel degenerative changes without an acute osseous abnormality. Electronically Signed   By: Virgina Norfolk M.D.   On: 05/24/2021 15:09   CT Head Wo Contrast  Result Date: 05/24/2021 CLINICAL DATA:  Status post fall. EXAM: CT HEAD WITHOUT CONTRAST TECHNIQUE: Contiguous axial images were obtained from the base of the skull through the vertex without intravenous contrast. COMPARISON:  None. FINDINGS: Brain: There is mild cerebral atrophy with widening of the extra-axial spaces and ventricular dilatation. There are areas of decreased attenuation within the white matter tracts of the supratentorial brain, consistent with microvascular disease changes. Vascular: No hyperdense vessel or unexpected calcification. Skull: Normal. Negative for fracture or focal lesion. Sinuses/Orbits: No acute finding. Other: None. IMPRESSION: 1. Generalized cerebral  atrophy. 2. No acute intracranial abnormality. Electronically Signed   By: Virgina Norfolk M.D.   On: 05/24/2021 15:15   CT Cervical Spine Wo Contrast  Result Date: 05/24/2021 CLINICAL DATA:  Status post trauma. EXAM: CT CERVICAL SPINE WITHOUT CONTRAST TECHNIQUE: Multidetector CT imaging of the cervical spine was performed without intravenous contrast. Multiplanar CT image reconstructions were also generated. COMPARISON:  None. FINDINGS: Alignment: Approximately 1 mm anterolisthesis of the C4 vertebral body is noted on C5. Skull base and vertebrae: No acute fracture. No primary bone lesion or focal pathologic process. Soft tissues and spinal canal: No prevertebral fluid or swelling. No visible canal hematoma. Disc levels: Moderate severity endplate sclerosis  is seen at the levels of at C5-C6, C6-C7 and C7-T1. Moderate severity multilevel intervertebral disc space narrowing is seen, most prominent at the level of C5-C6. Bilateral moderate severity multilevel facet joint hypertrophy is noted. Upper chest: Negative. Other: None. IMPRESSION: Moderate to marked severity multilevel degenerative changes without an acute cervical spine fracture. Electronically Signed   By: Virgina Norfolk M.D.   On: 05/24/2021 15:18   DG Shoulder Left  Result Date: 05/24/2021 CLINICAL DATA:  Status post fall. EXAM: LEFT SHOULDER - 2+ VIEW COMPARISON:  None. FINDINGS: There is no evidence of fracture or dislocation. Mild degenerative changes seen involving the left acromioclavicular joint and left glenohumeral articulation. Radiopaque surgical clips are seen overlying the left axilla. IMPRESSION: No acute osseous abnormality. Electronically Signed   By: Virgina Norfolk M.D.   On: 05/24/2021 15:07   DG Hips Bilat W or Wo Pelvis 3-4 Views  Result Date: 05/24/2021 CLINICAL DATA:  Status post fall. EXAM: DG HIP (WITH OR WITHOUT PELVIS) 3-4V BILAT COMPARISON:  None. FINDINGS: There is no evidence of hip fracture or dislocation. There is no evidence of arthropathy or other focal bone abnormality. IMPRESSION: Negative. Electronically Signed   By: Virgina Norfolk M.D.   On: 05/24/2021 15:11    Procedures .Marland KitchenLaceration Repair  Date/Time: 05/24/2021 4:20 PM Performed by: Rodney Booze, PA-C Authorized by: Rodney Booze, PA-C   Consent:    Consent obtained:  Verbal   Consent given by:  Patient   Risks, benefits, and alternatives were discussed: yes     Risks discussed:  Infection and pain   Alternatives discussed:  No treatment Universal protocol:    Procedure explained and questions answered to patient or proxy's satisfaction: yes     Patient identity confirmed:  Verbally with patient Anesthesia:    Anesthesia method:  Topical application and local infiltration    Topical anesthetic:  LET   Local anesthetic:  Lidocaine 2% WITH epi Laceration details:    Location:  Scalp   Scalp location:  L parietal   Length (cm):  0.5 Pre-procedure details:    Preparation:  Patient was prepped and draped in usual sterile fashion and imaging obtained to evaluate for foreign bodies Exploration:    Limited defect created (wound extended): no     Hemostasis achieved with:  Direct pressure and epinephrine   Imaging obtained: bedside ultrasound     Imaging outcome: foreign body noted     Wound exploration: wound explored through full range of motion     Contaminated: no   Treatment:    Area cleansed with:  Povidone-iodine   Amount of cleaning:  Standard   Visualized foreign bodies/material removed: no     Debridement:  None Skin repair:    Repair method:  Sutures   Suture size:  5-0  Suture material:  Plain gut   Suture technique:  Simple interrupted   Number of sutures:  2   Medications Ordered in ED Medications  acetaminophen (TYLENOL) tablet 650 mg (650 mg Oral Given 05/24/21 1425)  lidocaine-EPINEPHrine-tetracaine (LET) topical gel (3 mLs Topical Given 05/24/21 1425)  lidocaine-EPINEPHrine (XYLOCAINE W/EPI) 2 %-1:100000 (with pres) injection 20 mL (20 mLs Intradermal Given 05/24/21 1425)    ED Course  I have reviewed the triage vital signs and the nursing notes.  Pertinent labs & imaging results that were available during my care of the patient were reviewed by me and considered in my medical decision making (see chart for details).    MDM Rules/Calculators/A&P                          75 year old female presenting for evaluation after mechanical fall down the steps prior to arrival.  Sustained head trauma but did not have LOC.  Is complaining of pain to the head, neck, left shoulder and lower back.  She is neuro intact on my evaluation.  She does have a small laceration to the left side of forehead.  Her Tdap is up-to-date.   Laceration repair performed.   Pt tolerated well  Reviewed/interpreted imaging CT head/cervical spine - neg for acute traumatic injury Xray lumbar -  neg for acute traumatic injury Xray pelvis -  neg for acute traumatic injury Xray left shoulder - neg for acute traumatic injury  Patient advised to follow-up with PCP and return to the ED for new or worsening symptoms.  She voiced understanding the plan and reasons to return.  All questions answered.  Patient stable for discharge.  Final Clinical Impression(s) / ED Diagnoses Final diagnoses:  Fall, initial encounter  Injury of head, initial encounter  Laceration of scalp, initial encounter    Rx / DC Orders ED Discharge Orders     None        Bishop Dublin 05/24/21 1622    Charlesetta Shanks, MD 05/29/21 2147

## 2021-05-24 NOTE — ED Notes (Signed)
Pt ambulated in hall with no assistance from this RN. Pt denies complaints of sever pain, shortness of breath, or any other issues while ambulating. Pt got in and out of chair without any assistance as well

## 2021-05-24 NOTE — Discharge Instructions (Addendum)
You had sutures placed today that are absorbable.  You do not need to get them removed.  Please follow up with your primary care provider within 5-7 days for re-evaluation of your symptoms. If you do not have a primary care provider, information for a healthcare clinic has been provided for you to make arrangements for follow up care. Please return to the emergency department for any new or worsening symptoms.

## 2021-05-24 NOTE — ED Triage Notes (Signed)
Pt states that she was carrying small refrigerator down some stairs when she fell. Hit head. Small puncture/lac on L side of head. Bleeding controlled. Denies LOC. Alert and oriented.

## 2021-05-28 ENCOUNTER — Other Ambulatory Visit: Payer: Self-pay | Admitting: Internal Medicine

## 2021-05-28 NOTE — Telephone Encounter (Signed)
Please refill as per office routine med refill policy (all routine meds refilled for 3 mo or monthly per pt preference up to one year from last visit, then month to month grace period for 3 mo, then further med refills will have to be denied)  

## 2021-05-31 ENCOUNTER — Ambulatory Visit (INDEPENDENT_AMBULATORY_CARE_PROVIDER_SITE_OTHER): Payer: Medicare Other | Admitting: Internal Medicine

## 2021-05-31 ENCOUNTER — Other Ambulatory Visit: Payer: Self-pay

## 2021-05-31 ENCOUNTER — Encounter: Payer: Self-pay | Admitting: Internal Medicine

## 2021-05-31 VITALS — BP 116/74 | HR 80 | Temp 98.5°F | Ht 60.0 in | Wt 157.4 lb

## 2021-05-31 DIAGNOSIS — S0101XD Laceration without foreign body of scalp, subsequent encounter: Secondary | ICD-10-CM | POA: Diagnosis not present

## 2021-05-31 DIAGNOSIS — E1165 Type 2 diabetes mellitus with hyperglycemia: Secondary | ICD-10-CM | POA: Diagnosis not present

## 2021-05-31 DIAGNOSIS — I10 Essential (primary) hypertension: Secondary | ICD-10-CM

## 2021-05-31 DIAGNOSIS — F419 Anxiety disorder, unspecified: Secondary | ICD-10-CM

## 2021-05-31 DIAGNOSIS — S0101XA Laceration without foreign body of scalp, initial encounter: Secondary | ICD-10-CM | POA: Insufficient documentation

## 2021-05-31 LAB — LIPID PANEL
Cholesterol: 127 mg/dL (ref 0–200)
HDL: 35.1 mg/dL — ABNORMAL LOW (ref >39.00)
LDL Cholesterol: 53 mg/dL (ref 0–99)
NonHDL: 92.08
Total CHOL/HDL Ratio: 4
Triglycerides: 195 mg/dL — ABNORMAL HIGH (ref 0.0–149.0)
VLDL: 39 mg/dL (ref 0.0–40.0)

## 2021-05-31 LAB — BASIC METABOLIC PANEL
BUN: 16 mg/dL (ref 6–23)
CO2: 29 mEq/L (ref 19–32)
Calcium: 9.9 mg/dL (ref 8.4–10.5)
Chloride: 106 mEq/L (ref 96–112)
Creatinine, Ser: 0.72 mg/dL (ref 0.40–1.20)
GFR: 82.18 mL/min (ref >60.00)
Glucose, Bld: 109 mg/dL — ABNORMAL HIGH (ref 70–99)
Potassium: 3.8 mEq/L (ref 3.5–5.1)
Sodium: 140 mEq/L (ref 135–145)

## 2021-05-31 LAB — HEPATIC FUNCTION PANEL
ALT: 20 U/L (ref 0–35)
AST: 15 U/L (ref 0–37)
Albumin: 4.1 g/dL (ref 3.5–5.2)
Alkaline Phosphatase: 82 U/L (ref 39–117)
Bilirubin, Direct: 0.1 mg/dL (ref 0.0–0.3)
Total Bilirubin: 0.4 mg/dL (ref 0.2–1.2)
Total Protein: 7.2 g/dL (ref 6.0–8.3)

## 2021-05-31 LAB — HEMOGLOBIN A1C: Hgb A1c MFr Bld: 6.8 % — ABNORMAL HIGH (ref 4.6–6.5)

## 2021-05-31 NOTE — Progress Notes (Signed)
Patient ID: Kristen Howe, female   DOB: [birth year], 75 y.o.   MRN: [id number]        Chief Complaint: follow up fall with left frontal scalp laceration       HPI:  Kristen Howe is a 75 y.o. female here with above, with 2 dissolvable stitches without further bleeding and denies worsening red, tender swelling of the area;  Denies fever, cough or new urinary symptoms.  Pt denies chest pain, increased sob or doe, wheezing, orthopnea, PND, increased LE swelling, palpitations, dizziness or syncope.   Pt denies polydipsia, polyuria, or new focal neuro s/s.    Denies worsening depressive symptoms, suicidal ideation, or panic; has ongoing anxiety       Wt Readings from Last 3 Encounters:  05/31/21 157 lb 6.4 oz (71.4 kg)  02/05/21 154 lb (69.9 kg)  09/12/20 150 lb 9.6 oz (68.3 kg)   BP Readings from Last 3 Encounters:  05/31/21 116/74  05/24/21 124/71  04/03/21 122/80         Past Medical History:  Diagnosis Date   Allergy    Anemia    as a child   Anxiety    Arthritis    "lower back" (01/25/2016)   Bilateral breast cancer ([place])    [facility] 01/25/2016   Cataract    Depression    Esophageal diverticulum    GERD (gastroesophageal reflux disease)    Hyperlipidemia    Hypertension    Leg cramps    takes Flexeril   Migraine    "maybe monthly" (01/25/2016)   Osteoporosis    Pneumonia ~ 2005 X 1   Primary cancer of 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 breast ([place]) 12/14/2015   Rheumatic fever 1950s   Past Surgical History:  Procedure Laterality Date   BREAST BIOPSY Bilateral 2016   CATARACT EXTRACTION W/ INTRAOCULAR LENS  IMPLANT, BILATERAL Bilateral 05/2015-06/2015   CESAREAN SECTION  1974; 1976   COLONOSCOPY     ESOPHAGEAL MANOMETRY N/A 03/10/2017   Procedure: ESOPHAGEAL MANOMETRY (EM);  Surgeon: Ladene Artist, MD;  Location: [facility];  Service: Endoscopy;  Laterality: N/A;   EYE MUSCLE SURGERY Bilateral 1950s   for cross eyes as a child   INSERTION OF MESH N/A 09/12/2015    Procedure: INSERTION OF MESH;  Surgeon: Erroll Luna, MD;  Location: [facility];  Service: General;  Laterality: N/A;   MASTECTOMY COMPLETE / SIMPLE W/ SENTINEL NODE BIOPSY Right 01/25/2016   MASTECTOMY MODIFIED RADICAL Left 01/25/2016   MASTECTOMY W/ SENTINEL NODE BIOPSY Bilateral 01/25/2016   Procedure: RIGHT MASTECTOMY WITH SENTINEL RIGHT LYMPH NODE BIOPSY, LEFT MODIFIED RADICAL MASTECTOMY;  Surgeon: Stark Klein, MD;  Location: [facility];  Service: General;  Laterality: Bilateral;   TONSILLECTOMY  [id number]   UMBILICAL HERNIA REPAIR N/A 09/12/2015   Procedure: REPAIR OF INCISIONAL AND UMBILICAL HERNIA;  Surgeon: Erroll Luna, MD;  Location: [facility];  Service: General;  Laterality: N/A;    reports that she quit smoking about 5 years ago. Her smoking use included cigarettes. She smoked an average of 1.00 packs per day. She has never used smokeless tobacco. She reports that she does not drink alcohol and does not use drugs. family history includes Breast cancer in her cousin, cousin, cousin, cousin, and sister; Cancer in her cousin; Cancer (age of onset: 59) in her maternal grandmother; Diabetes in her father, paternal aunt, and paternal uncle; Gout in her paternal grandfather and paternal grandmother; Healthy in her mother; Heart Problems in her father; Lung cancer  in her cousin and maternal uncle; Lupus in her cousin; Lymphoma in her father; Other in her father and mother; Stroke in her maternal grandfather. Allergies  Allergen Reactions   Multihance [Gadobenate] Itching, Rash and Other (See Comments)    Pt developed immediate itching, red eyes and rash after MR contrast. Subsided with 25 mg of benadryl. Pt will need 13 hr prep prior to future studies. 02/27/21   Current Outpatient Medications on File Prior to Visit  Medication Sig Dispense Refill   ACCU-CHEK GUIDE test strip USE AS DIRECTED ONCE DAILY 100 each 0   albuterol (VENTOLIN HFA) 108 (90 Base) MCG/ACT inhaler Inhale 2 puffs into the lungs every 6 (six)  hours as needed for wheezing or shortness of breath. 18 g 11   ALPRAZolam (XANAX) 0.5 MG tablet Take 1 tablet 1 hour prior to your MRI test.  Can repeat in 1 hour if no effect. 2 tablet 0   anastrozole (ARIMIDEX) 1 MG tablet Take 1 tablet by mouth once daily 90 tablet 0   Artificial Tear Ointment (DRY EYES OP) Apply 1 drop to eye daily as needed (for dry eyes).      atorvastatin (LIPITOR) 10 MG tablet Take 1 tablet by mouth once daily 90 tablet 3   benzonatate (TESSALON) 200 MG capsule Take 1 capsule (200 mg total) by mouth 3 (three) times daily as needed for cough. 40 capsule 1   Calcium-Phosphorus-Vitamin D (CITRACAL +D3 PO) Take 2 tablets by mouth 2 (two) times daily.      Fluticasone-Salmeterol (ADVAIR) 250-50 MCG/DOSE AEPB Inhale 1 puff into the lungs in the morning and at bedtime. 60 each 11   hydrochlorothiazide (HYDRODIURIL) 25 MG tablet Take 1 tablet by mouth once daily 90 tablet 0   HYDROcodone-acetaminophen (NORCO) 7.5-325 MG tablet Take 1 tablet by mouth every 6 (six) hours as needed for moderate pain. 30 tablet 0   ibuprofen (ADVIL) 800 MG tablet Take 800 mg by mouth every 6 (six) hours as needed.     ibuprofen (ADVIL,MOTRIN) 200 MG tablet Take 400 mg by mouth 2 (two) times daily as needed for moderate pain.     losartan (COZAAR) 50 MG tablet Take 1 tablet by mouth once daily 90 tablet 0   methocarbamol (ROBAXIN) 500 MG tablet Take 1 tablet (500 mg total) by mouth every 6 (six) hours as needed for muscle spasms. 60 tablet 1   Multiple Vitamins-Minerals (MULTIVITAMIN WITH MINERALS) tablet Take 1 tablet by mouth daily. Reported on 12/20/2015     pantoprazole (PROTONIX) 40 MG tablet TAKE 1 TABLET BY MOUTH TWICE DAILY BEFORE MEAL(S) 60 tablet 11   predniSONE (DELTASONE) 10 MG tablet 3 tabs by mouth per day for 3 days,2tabs per day for 3 days,1tab per day for 3 days 18 tablet 0   traMADol (ULTRAM) 50 MG tablet Take 1 tablet (50 mg total) by mouth every 6 (six) hours as needed. 30 tablet 0    venlafaxine XR (EFFEXOR-XR) 75 MG 24 hr capsule TAKE 1 CAPSULE BY MOUTH ONCE DAILY WITH BREAKFAST 90 capsule 0   zolpidem (AMBIEN) 5 MG tablet Take 1 tablet (5 mg total) by mouth at bedtime as needed for sleep. 30 tablet 5   No current facility-administered medications on file prior to visit.        ROS:  All others reviewed and negative.  Objective        PE:  BP 116/74 (BP Location: Left Arm, Patient Position: Sitting, Cuff Size: Normal)   Pulse  80   Temp 98.5 F (36.9 C) (Oral)   Ht 5' (1.524 m)   Wt 157 lb 6.4 oz (71.4 kg)   SpO2 92%   BMI 30.74 kg/m                 Constitutional: Pt appears in NAD               HENT: Head: NCAT.                Right Ear: External ear normal.                 Left Ear: External ear normal.                Eyes: . Pupils are equal, round, and reactive to light. Conjunctivae and EOM are normal               Nose: without d/c or deformity               Neck: Neck supple. Gross normal ROM               Cardiovascular: Normal rate and regular rhythm.                 Pulmonary/Chest: Effort normal and breath sounds without rales or wheezing.                Abd:  Soft, NT, ND, + BS, no organomegaly               Neurological: Pt is alert. At baseline orientation, motor grossly intact               Skin:  LE edema - none, left frontal scalp lesion intact without signficant red, sweling, tender, or drainage               Psychiatric: Pt behavior is normal without agitation , 1+ nervous  Micro: none  Cardiac tracings I have personally interpreted today:  none  Pertinent Radiological findings (summarize): none   Lab Results  Component Value Date   WBC 6.7 02/05/2021   HGB 13.0 02/05/2021   HCT 38.2 02/05/2021   PLT 265.0 02/05/2021   GLUCOSE 109 (H) 05/31/2021   CHOL 127 05/31/2021   TRIG 195.0 (H) 05/31/2021   HDL 35.10 (L) 05/31/2021   LDLDIRECT 137.0 06/17/2018   LDLCALC 53 05/31/2021   ALT 20 05/31/2021   AST 15 05/31/2021   NA 140  05/31/2021   K 3.8 05/31/2021   CL 106 05/31/2021   CREATININE 0.72 05/31/2021   BUN 16 05/31/2021   CO2 29 05/31/2021   TSH 3.28 02/05/2021   HGBA1C 6.8 (H) 05/31/2021   MICROALBUR <0.7 02/05/2021   Assessment/Plan:  Kelia Gibbon is a 75 y.o. White or Caucasian [1] female with  has a past medical history of Allergy, Anemia, Anxiety, Arthritis, Bilateral breast cancer ([place]), Cataract, Depression, Esophageal diverticulum, GERD (gastroesophageal reflux disease), Hyperlipidemia, Hypertension, Leg cramps, Migraine, Osteoporosis, Pneumonia (~ 2005 X 1), Primary cancer of 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 breast ([place]) (12/14/2015), and Rheumatic fever (1950s).  Essential hypertension BP Readings from Last 3 Encounters:  05/31/21 116/74  05/24/21 124/71  04/03/21 122/80   Stable, pt to continue medical treatment losartan, hct   Diabetes mellitus ([place]) Lab Results  Component Value Date   HGBA1C 6.8 (H) 05/31/2021   Stable, pt to continue current medical treatment  - diet   Scalp laceration Very recent new, but not  worsening and remains uncomplicated, pt reassured no other specific tx needed, stitches to dissolve, so to f/u any worsening symptoms or concerns as needed  Anxiety Chronic mild stable, declines need for further tx or counseling referral at this time  Followup: Return in about 6 months (around 11/30/2021), or if symptoms worsen or fail to improve.  Cathlean Cower, MD 06/03/2021 4:44 PM [facility]'

## 2021-05-31 NOTE — Patient Instructions (Signed)
Please continue all other medications as before, and refills have been done if requested.  Please have the pharmacy call with any other refills you may need.  Please continue your efforts at being more active, low cholesterol diet, and weight control  Please keep your appointments with your specialists as you may have planned  Please go to the LAB at the blood drawing area for the tests to be done  You will be contacted by phone if any changes need to be made immediately.  Otherwise, you will receive a letter about your results with an explanation, but please check with MyChart first.  Please remember to sign up for MyChart if you have not done so, as this will be important to you in the future with finding out test results, communicating by private email, and scheduling acute appointments online when needed.  Please make an Appointment to return in 6 months, or sooner if needed, also with Lab Appointment for testing done 3-5 days before at the [facility] (so this is for TWO appointments - please see the scheduling desk as you leave)  Due to the ongoing Covid 19 pandemic, our lab now requires an appointment for any labs done at our office.  If you need labs done and do not have an appointment, please call our office ahead of time to schedule before presenting to the lab for your testing.  OK TO CANCEL THE APPT FOR NEXT MONTH

## 2021-06-03 ENCOUNTER — Encounter: Payer: Self-pay | Admitting: Internal Medicine

## 2021-06-03 NOTE — Assessment & Plan Note (Addendum)
Very recent new, but not worsening and remains uncomplicated, pt reassured no other specific tx needed, stitches to dissolve, so to f/u any worsening symptoms or concerns as needed

## 2021-06-03 NOTE — Assessment & Plan Note (Signed)
Lab Results  Component Value Date   HGBA1C 6.8 (H) 05/31/2021   Stable, pt to continue current medical treatment  - diet

## 2021-06-03 NOTE — Assessment & Plan Note (Signed)
Chronic mild stable, declines need for further tx or counseling referral at this time

## 2021-06-03 NOTE — Assessment & Plan Note (Signed)
BP Readings from Last 3 Encounters:  05/31/21 116/74  05/24/21 124/71  04/03/21 122/80   Stable, pt to continue medical treatment losartan, hct

## 2021-07-02 ENCOUNTER — Other Ambulatory Visit: Payer: Medicare Other

## 2021-07-02 ENCOUNTER — Ambulatory Visit: Payer: Medicare Other | Admitting: Oncology

## 2021-07-03 ENCOUNTER — Ambulatory Visit: Payer: Medicare Other

## 2021-07-05 ENCOUNTER — Ambulatory Visit: Payer: Medicare Other

## 2021-07-11 ENCOUNTER — Ambulatory Visit (INDEPENDENT_AMBULATORY_CARE_PROVIDER_SITE_OTHER): Payer: Medicare Other

## 2021-07-11 ENCOUNTER — Other Ambulatory Visit: Payer: Self-pay

## 2021-07-11 VITALS — BP 116/70 | HR 68 | Temp 97.5°F | Ht 60.0 in | Wt 157.6 lb

## 2021-07-11 DIAGNOSIS — Z0000 Encounter for general adult medical examination without abnormal findings: Secondary | ICD-10-CM | POA: Diagnosis not present

## 2021-07-11 DIAGNOSIS — Z23 Encounter for immunization: Secondary | ICD-10-CM

## 2021-07-11 NOTE — Progress Notes (Signed)
Subjective:   Kristen Howe is a 75 y.o. female who presents for Medicare Annual (Subsequent) preventive examination.  Review of Systems     Cardiac Risk Factors include: advanced age (>28mn, >>36women);diabetes mellitus;dyslipidemia;hypertension;family history of premature cardiovascular disease;obesity (BMI >30kg/m2)     Objective:    Today's Vitals   07/11/21 1125  BP: 116/70  Pulse: 68  Temp: (!) 97.5 F (36.4 C)  SpO2: 90%  Weight: 157 lb 9.6 oz (71.5 kg)  Height: 5' (1.524 m)  PainSc: 0-No pain   Body mass index is 30.78 kg/m.  Advanced Directives 07/11/2021 05/24/2021 08/19/2019 11/13/2017 02/26/2017 02/13/2017 12/04/2016  Does Patient Have a Medical Advance Directive? No No No No No No No  Does patient want to make changes to medical advance directive? - - - - - - -  Would patient like information on creating a medical advance directive? Yes (MAU/Ambulatory/Procedural Areas - Information given) - Yes (MAU/Ambulatory/Procedural Areas - Information given) Yes (ED - Information included in AVS) - - -    Current Medications (verified) Outpatient Encounter Medications as of 07/11/2021  Medication Sig   ACCU-CHEK GUIDE test strip USE AS DIRECTED ONCE DAILY   albuterol (VENTOLIN HFA) 108 (90 Base) MCG/ACT inhaler Inhale 2 puffs into the lungs every 6 (six) hours as needed for wheezing or shortness of breath.   ALPRAZolam (XANAX) 0.5 MG tablet Take 1 tablet 1 hour prior to your MRI test.  Can repeat in 1 hour if no effect.   anastrozole (ARIMIDEX) 1 MG tablet Take 1 tablet by mouth once daily   Artificial Tear Ointment (DRY EYES OP) Apply 1 drop to eye daily as needed (for dry eyes).    atorvastatin (LIPITOR) 10 MG tablet Take 1 tablet by mouth once daily   benzonatate (TESSALON) 200 MG capsule Take 1 capsule (200 mg total) by mouth 3 (three) times daily as needed for cough.   Calcium-Phosphorus-Vitamin D (CITRACAL +D3 PO) Take 2 tablets by mouth 2 (two) times daily.     Fluticasone-Salmeterol (ADVAIR) 250-50 MCG/DOSE AEPB Inhale 1 puff into the lungs in the morning and at bedtime.   hydrochlorothiazide (HYDRODIURIL) 25 MG tablet Take 1 tablet by mouth once daily   HYDROcodone-acetaminophen (NORCO) 7.5-325 MG tablet Take 1 tablet by mouth every 6 (six) hours as needed for moderate pain.   ibuprofen (ADVIL) 800 MG tablet Take 800 mg by mouth every 6 (six) hours as needed.   ibuprofen (ADVIL,MOTRIN) 200 MG tablet Take 400 mg by mouth 2 (two) times daily as needed for moderate pain.   losartan (COZAAR) 50 MG tablet Take 1 tablet by mouth once daily   methocarbamol (ROBAXIN) 500 MG tablet Take 1 tablet (500 mg total) by mouth every 6 (six) hours as needed for muscle spasms.   Multiple Vitamins-Minerals (MULTIVITAMIN WITH MINERALS) tablet Take 1 tablet by mouth daily. Reported on 12/20/2015   pantoprazole (PROTONIX) 40 MG tablet TAKE 1 TABLET BY MOUTH TWICE DAILY BEFORE MEAL(S)   predniSONE (DELTASONE) 10 MG tablet 3 tabs by mouth per day for 3 days,2tabs per day for 3 days,1tab per day for 3 days   traMADol (ULTRAM) 50 MG tablet Take 1 tablet (50 mg total) by mouth every 6 (six) hours as needed.   venlafaxine XR (EFFEXOR-XR) 75 MG 24 hr capsule TAKE 1 CAPSULE BY MOUTH ONCE DAILY WITH BREAKFAST   zolpidem (AMBIEN) 5 MG tablet Take 1 tablet (5 mg total) by mouth at bedtime as needed for sleep.   No facility-administered  encounter medications on file as of 07/11/2021.    Allergies (verified) Multihance [gadobenate]   History: Past Medical History:  Diagnosis Date   Allergy    Anemia    as a child   Anxiety    Arthritis    "lower back" (01/25/2016)   Bilateral breast cancer ([place])    Archie Endo 01/25/2016   Cataract    Depression    Esophageal diverticulum    GERD (gastroesophageal reflux disease)    Hyperlipidemia    Hypertension    Leg cramps    takes Flexeril   Migraine    "maybe monthly" (01/25/2016)   Osteoporosis    Pneumonia ~ 2005 X 1   Primary cancer of  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 breast ([place]) 12/14/2015   Rheumatic fever 1950s   Past Surgical History:  Procedure Laterality Date   BREAST BIOPSY Bilateral 2016   CATARACT EXTRACTION W/ INTRAOCULAR LENS  IMPLANT, BILATERAL Bilateral 05/2015-06/2015   CESAREAN SECTION  1974; 1976   COLONOSCOPY     ESOPHAGEAL MANOMETRY N/A 03/10/2017   Procedure: ESOPHAGEAL MANOMETRY (EM);  Surgeon: Ladene Artist, MD;  Location: [facility];  Service: Endoscopy;  Laterality: N/A;   EYE MUSCLE SURGERY Bilateral 1950s   for cross eyes as a child   INSERTION OF MESH N/A 09/12/2015   Procedure: INSERTION OF MESH;  Surgeon: Erroll Luna, MD;  Location: [place];  Service: General;  Laterality: N/A;   MASTECTOMY COMPLETE / SIMPLE W/ SENTINEL NODE BIOPSY Right 01/25/2016   MASTECTOMY MODIFIED RADICAL Left 01/25/2016   MASTECTOMY W/ SENTINEL NODE BIOPSY Bilateral 01/25/2016   Procedure: RIGHT MASTECTOMY WITH SENTINEL RIGHT LYMPH NODE BIOPSY, LEFT MODIFIED RADICAL MASTECTOMY;  Surgeon: Stark Klein, MD;  Location: [place];  Service: General;  Laterality: Bilateral;   TONSILLECTOMY  [id number]   UMBILICAL HERNIA REPAIR N/A 09/12/2015   Procedure: REPAIR OF INCISIONAL AND UMBILICAL HERNIA;  Surgeon: Erroll Luna, MD;  Location: [facility];  Service: General;  Laterality: N/A;   Family History  Problem Relation Age of Onset   Healthy Mother    Other Mother        +"throat polyps"; +smoker   Lymphoma Father        d. 21   Diabetes Father    Heart Problems Father    Other Father        "tumors on his kidneys"   Breast cancer Sister        dx. metachronous bilateral breast cancers at 77 and 85; - BRCA1/2 testing   Cancer Maternal Grandmother 17       dx. spinal cancer   Stroke Maternal Grandfather    Gout Paternal Grandmother    Gout Paternal Grandfather    Lung cancer Maternal Uncle        d. 54-72; +smoker   Diabetes Paternal Aunt    Diabetes Paternal Uncle    Breast cancer Cousin        maternal 1st cousin dx.  unspecified age   Lung cancer Cousin        maternal 1st cousin dx. lung cancer; +smoker   Lupus Cousin    Breast cancer Cousin        paternal 1st cousin dx. 43s   Breast cancer Cousin        paternal 1st cousin dx. 47s-30s   Cancer Cousin        paternal 1st cousin dx. oral cancer with metastasis, also "lost a leg to cancer"; dx. 26s; +EtOH abuse  Breast cancer Cousin        paternal 1st cousin, once-removed dx. at unspecified age   Colon cancer Neg Hx    Esophageal cancer Neg Hx    Rectal cancer Neg Hx    Stomach cancer Neg Hx    Social History   Socioeconomic History   Marital status: Divorced    Spouse name: Not on file   Number of children: 2   Years of education: 12   Highest education level: Not on file  Occupational History   Occupation: retired  Tobacco Use   Smoking status: Former    Packs/day: 1.00    Years: 0.00    Pack years: 0.00    Types: Cigarettes    Quit date: 12/24/2015    Years since quitting: 5.5   Smokeless tobacco: Never  Vaping Use   Vaping Use: Never used  Substance and Sexual Activity   Alcohol use: No    Alcohol/week: 0.0 standard drinks    Comment: 07/31/16 pt states she dosn't drink anymore,04/16/2016 "1/2 bottle wine per month" previously - not much now   Drug use: No   Sexual activity: Never  Other Topics Concern   Not on file  Social History Narrative   Lives at home with sister and nephews.   Fun: Internet; games, talk to people, video games   Denies religious beliefs effecting health care.   Denies abuse and feels safe at home.    Social Determinants of Health   Financial Resource Strain: Low Risk    Difficulty of Paying Living Expenses: Not hard at all  Food Insecurity: No Food Insecurity   Worried About Charity fundraiser in the Last Year: Never true   [place] in the Last Year: Never true  Transportation Needs: No Transportation Needs   Lack of Transportation (Medical): No   Lack of Transportation (Non-Medical): No   Physical Activity: Sufficiently Active   Days of Exercise per Week: 5 days   Minutes of Exercise per Session: 30 min  Stress: No Stress Concern Present   Feeling of Stress : Not at all  Social Connections: Socially Isolated   Frequency of Communication with Friends and Family: More than three times a week   Frequency of Social Gatherings with Friends and Family: Once a week   Attends Religious Services: Never   Marine scientist or Organizations: No   Attends Music therapist: Never   Marital Status: Never married    Tobacco Counseling Counseling given: Not Answered   Clinical Intake:  Pre-visit preparation completed: Yes  Pain : No/denies pain Pain Score: 0-No pain     BMI - recorded: 30.78 Nutritional Status: BMI > 30  Obese Nutritional Risks: None Diabetes: Yes CBG done?: No Did pt. bring in CBG monitor from home?: No  How often do you need to have someone help you when you read instructions, pamphlets, or other written materials from your doctor or pharmacy?: 1 - Never What is the last grade level you completed in school?: High School Graduate  Diabetic? yes  Interpreter Needed?: No  Information entered by :: Lisette Abu, LPN   Activities of Daily Living In your present state of health, do you have any difficulty performing the following activities: 07/11/2021 02/05/2021  Hearing? N N  Vision? N Y  Difficulty concentrating or making decisions? N N  Walking or climbing stairs? N N  Dressing or bathing? N N  Doing errands, shopping? N N  Preparing Food and eating ? N -  Using the Toilet? N -  In the past six months, have you accidently leaked urine? N -  Do you have problems with loss of bowel control? N -  Managing your Medications? N -  Managing your Finances? N -  Housekeeping or managing your Housekeeping? N -  Some recent data might be hidden    Patient Care Team: Biagio Borg, MD as PCP - General (Internal  Medicine) Magrinat, Virgie Dad, MD as Consulting Physician (Oncology) Kyung Rudd, MD as Consulting Physician (Radiation Oncology) Stark Klein, MD as Consulting Physician (General Surgery) [facility] as Consulting Physician (Ophthalmology)  Indicate any recent Medical Services you may have received from other than [facility] providers in the past year (date may be approximate).     Assessment:   This is a routine wellness examination for Inari.  Hearing/Vision screen Hearing Screening - Comments:: Patient denied any hearing difficulty. Vision Screening - Comments:: Patient wears glasses. [facility] exam [facility] by [facility].  Dietary issues and exercise activities discussed: Current Exercise Habits: Home exercise routine, Type of exercise: walking, Time (Minutes): 30, Frequency (Times/Week): 5, Weekly Exercise (Minutes/Week): 150, Intensity: Moderate, Exercise limited by: orthopedic condition(s)   Goals Addressed   None   Depression Screen PHQ 2/9 Scores 07/11/2021 04/03/2021 02/05/2021 02/05/2021 08/03/2020 08/03/2020 01/11/2020  PHQ - 2 Score 0 0 1 4 1 1 1   PHQ- 9 Score - 0 - 9 - 1 -    Fall Risk Fall Risk  07/11/2021 02/05/2021 08/03/2020 08/03/2020 01/11/2020  Falls in the past year? 0 0 0 0 0  Number falls in past yr: 0 - - 0 -  Injury with Fall? 0 - - 0 -  Risk for fall due to : No Fall Risks - - No Fall Risks -  Follow up Falls evaluation completed - - Falls evaluation completed -    FALL RISK PREVENTION PERTAINING TO THE HOME:  Any stairs in or around the home? Yes  If so, are there any without handrails? No  Home free of loose throw rugs in walkways, pet beds, electrical cords, etc? Yes  Adequate lighting in your home to reduce risk of falls? Yes   ASSISTIVE DEVICES UTILIZED TO PREVENT FALLS:  Life alert? No  Use of a cane, walker or w/c? No  Grab bars in the bathroom? Yes  Shower chair or bench in shower? No  Elevated toilet seat or a handicapped toilet? No    TIMED UP AND GO:  Was the test performed? Yes .  Length of time to ambulate 10 feet: 7 sec.   Gait steady and fast without use of assistive device  Cognitive Function: Normal cognitive status assessed by direct observation by this Nurse Health Advisor. No abnormalities found.   MMSE - Mini Mental State Exam 11/13/2017  Not completed: Refused        Immunizations Immunization History  Administered Date(s) Administered   Pneumococcal Conjugate-13 07/07/2019   Tdap 07/07/2019    TDAP status: Up to date  Flu Vaccine status: Declined, Education has been provided regarding the importance of this vaccine but patient still declined. Advised may receive this vaccine at local pharmacy or Health Dept. Aware to provide a copy of the vaccination record if obtained from local pharmacy or Health Dept. Verbalized acceptance and understanding.  Pneumococcal vaccine status: Up to date  Covid-19 vaccine status: Declined, Education has been provided regarding the importance of this vaccine but patient still  declined. Advised may receive this vaccine at local pharmacy or Health Dept.or vaccine clinic. Aware to provide a copy of the vaccination record if obtained from local pharmacy or Health Dept. Verbalized acceptance and understanding.  Qualifies for Shingles Vaccine? Yes   Zostavax completed No   Shingrix Completed?: No.    Education has been provided regarding the importance of this vaccine. Patient has been advised to call insurance company to determine out of pocket expense if they have not yet received this vaccine. Advised may also receive vaccine at local pharmacy or Health Dept. Verbalized acceptance and understanding.  Screening Tests Health Maintenance  Topic Date Due   Zoster Vaccines- Shingrix (1 of 2) Never done   OPHTHALMOLOGY EXAM  03/13/2021   PNA vac Low Risk Adult (2 of 2 - PPSV23) 08/03/2021 (Originally 07/06/2020)   COLONOSCOPY (Pts 45-9yr Insurance coverage will need to  be confirmed)  02/05/2022 (Originally 02/27/2020)   FOOT EXAM  08/03/2021   HEMOGLOBIN A1C  11/30/2021   TETANUS/TDAP  07/06/2029   DEXA SCAN  Completed   Hepatitis C Screening  Completed   HPV VACCINES  Aged Out   INFLUENZA VACCINE  Discontinued   MAMMOGRAM  Discontinued   COVID-19 Vaccine  Discontinued    Health Maintenance  Health Maintenance Due  Topic Date Due   Zoster Vaccines- Shingrix (1 of 2) Never done   OPHTHALMOLOGY EXAM  03/13/2021    Colorectal cancer screening: Type of screening: Colonoscopy. Completed 02/26/2017. Repeat every 3 years (patient refused)  Mammogram status: No longer required due to history of breast cancer.  Bone Density status: Completed 07/28/2020. Results reflect: Bone density results: OSTEOPENIA. Repeat every 2 years.  Lung Cancer Screening: (Low Dose CT Chest recommended if Age 75-80years, 30 pack-year currently smoking OR have quit w/in 15years.) does not qualify.   Lung Cancer Screening Referral: o  Additional Screening:  Hepatitis C Screening: does qualify; Completed yes  Vision Screening: Recommended annual ophthalmology exams for early detection of glaucoma and other disorders of the eye. Is the patient up to date with their annual eye exam?  Yes  Who is the provider or what is the name of the office in which the patient attends annual eye exams? [facility]If pt is not established with a provider, would they like to be referred to a provider to establish care? No .   Dental Screening: Recommended annual dental exams for proper oral hygiene  Community Resource Referral / Chronic Care Management: CRR required this visit?  No   CCM required this visit?  No      Plan:     I have personally reviewed and noted the following in the patient's chart:   Medical and social history Use of alcohol, tobacco or illicit drugs  Current medications and supplements including opioid prescriptions.  Functional ability and  status Nutritional status Physical activity Advanced directives List of other physicians Hospitalizations, surgeries, and ER visits in previous 12 months Vitals Screenings to include cognitive, depression, and falls Referrals and appointments  In addition, I have reviewed and discussed with patient certain preventive protocols, quality metrics, and best practice recommendations. A written personalized care plan for preventive services as well as general preventive health recommendations were provided to patient.     SSheral Flow LPN   72/75/1700  Nurse Notes: n/a

## 2021-07-11 NOTE — Patient Instructions (Addendum)
Kristen Howe , Thank you for taking time to come for your Medicare Wellness Visit. I appreciate your ongoing commitment to your health goals. Please review the following plan we discussed and let me know if I can assist you in the future.   Screening recommendations/referrals: Colonoscopy: last done 02/26/2017; due every 3 years Mammogram: discontinued Bone Density: last done 07/28/2020; due every 2 years Recommended yearly ophthalmology/optometry visit for glaucoma screening and checkup Recommended yearly dental visit for hygiene and checkup  Vaccinations: Influenza vaccine: declined Pneumococcal vaccine: 07/07/2019, 07/11/2021 Tdap vaccine: 07/07/2019; due every 10 years Shingles vaccine: declined   Covid-19: declined  Advanced directives: Advance directive discussed with you today. I have provided a copy for you to complete at home and have notarized. Once this is complete please bring a copy in to our office so we can scan it into your chart.  Conditions/risks identified: Yes; Client understands the importance of follow-up with providers by attending scheduled visits and discussed goals to eat healthier, increase physical activity, exercise the brain, socialize more, get enough sleep and make time for laughter.  Next appointment: Please schedule your next Medicare Wellness Visit with your Nurse Health Advisor in 1 year by calling [phone].   Preventive Care 75 Years and Older, Female Preventive care refers to lifestyle choices and visits with your health care provider that can promote health and wellness. What does preventive care include? A yearly physical exam. This is also called an annual well check. Dental exams once or twice a year. Routine eye exams. Ask your health care provider how often you should have your eyes checked. Personal lifestyle choices, including: Daily care of your teeth and gums. Regular physical activity. Eating a healthy diet. Avoiding tobacco and drug  use. Limiting alcohol use. Practicing safe sex. Taking low-dose aspirin every day. Taking vitamin and mineral supplements as recommended by your health care provider. What happens during an annual well check? The services and screenings done by your health care provider during your annual well check will depend on your age, overall health, lifestyle risk factors, and family history of disease. Counseling  Your health care provider may ask you questions about your: Alcohol use. Tobacco use. Drug use. Emotional well-being. Home and relationship well-being. Sexual activity. Eating habits. History of falls. Memory and ability to understand (cognition). Work and work Statistician. Reproductive health. Screening  You may have the following tests or measurements: Height, weight, and BMI. Blood pressure. Lipid and cholesterol levels. These may be checked every 5 years, or more frequently if you are over 48 years old. Skin check. Lung cancer screening. You may have this screening every year starting at age 77 if you have a 30-pack-year history of smoking and currently smoke or have quit within the past 15 years. Fecal occult blood test (FOBT) of the stool. You may have this test every year starting at age 69. Flexible sigmoidoscopy or colonoscopy. You may have a sigmoidoscopy every 5 years or a colonoscopy every 10 years starting at age 52. Hepatitis C blood test. Hepatitis B blood test. Sexually transmitted disease (STD) testing. Diabetes screening. This is done by checking your blood sugar (glucose) after you have not eaten for a while (fasting). You may have this done every 1-3 years. Bone density scan. This is done to screen for osteoporosis. You may have this done starting at age 54. Mammogram. This may be done every 1-2 years. Talk to your health care provider about how often you should have regular mammograms. Talk with  your health care provider about your test results, treatment  options, and if necessary, the need for more tests. Vaccines  Your health care provider may recommend certain vaccines, such as: Influenza vaccine. This is recommended every year. Tetanus, diphtheria, and acellular pertussis (Tdap, Td) vaccine. You may need a Td booster every 10 years. Zoster vaccine. You may need this after age 45. Pneumococcal 13-valent conjugate (PCV13) vaccine. One dose is recommended after age 73. Pneumococcal polysaccharide (PPSV23) vaccine. One dose is recommended after age 21. Talk to your health care provider about which screenings and vaccines you need and how often you need them. This information is not intended to replace advice given to you by your health care provider. Make sure you discuss any questions you have with your health care provider. Document Released: 12/29/2015 Document Revised: 08/21/2016 Document Reviewed: 10/03/2015 Elsevier Interactive Patient Education  2017 [place] Prevention in the Home Falls can cause injuries. They can happen to people of all ages. There are many things you can do to make your home safe and to help prevent falls. What can I do on the outside of my home? Regularly fix the edges of walkways and driveways and fix any cracks. Remove anything that might make you trip as you walk through a door, such as a raised step or threshold. Trim any bushes or trees on the path to your home. Use bright outdoor lighting. Clear any walking paths of anything that might make someone trip, such as rocks or tools. Regularly check to see if handrails are loose or broken. Make sure that both sides of any steps have handrails. Any raised decks and porches should have guardrails on the edges. Have any leaves, snow, or ice cleared regularly. Use sand or salt on walking paths during winter. Clean up any spills in your garage right away. This includes oil or grease spills. What can I do in the bathroom? Use night lights. Install grab  bars by the toilet and in the tub and shower. Do not use towel bars as grab bars. Use non-skid mats or decals in the tub or shower. If you need to sit down in the shower, use a plastic, non-slip stool. Keep the floor dry. Clean up any water that spills on the floor as soon as it happens. Remove soap buildup in the tub or shower regularly. Attach bath mats securely with double-sided non-slip rug tape. Do not have throw rugs and other things on the floor that can make you trip. What can I do in the bedroom? Use night lights. Make sure that you have a light by your bed that is easy to reach. Do not use any sheets or blankets that are too big for your bed. They should not hang down onto the floor. Have a firm chair that has side arms. You can use this for support while you get dressed. Do not have throw rugs and other things on the floor that can make you trip. What can I do in the kitchen? Clean up any spills right away. Avoid walking on wet floors. Keep items that you use a lot in easy-to-reach places. If you need to reach something above you, use a strong step stool that has a grab bar. Keep electrical cords out of the way. Do not use floor polish or wax that makes floors slippery. If you must use wax, use non-skid floor wax. Do not have throw rugs and other things on the floor that can make you trip.  What can I do with my stairs? Do not leave any items on the stairs. Make sure that there are handrails on both sides of the stairs and use them. Fix handrails that are broken or loose. Make sure that handrails are as long as the stairways. Check any carpeting to make sure that it is firmly attached to the stairs. Fix any carpet that is loose or worn. Avoid having throw rugs at the top or bottom of the stairs. If you do have throw rugs, attach them to the floor with carpet tape. Make sure that you have a light switch at the top of the stairs and the bottom of the stairs. If you do not have them,  ask someone to add them for you. What else can I do to help prevent falls? Wear shoes that: Do not have high heels. Have rubber bottoms. Are comfortable and fit you well. Are closed at the toe. Do not wear sandals. If you use a stepladder: Make sure that it is fully opened. Do not climb a closed stepladder. Make sure that both sides of the stepladder are locked into place. Ask someone to hold it for you, if possible. Clearly mark and make sure that you can see: Any grab bars or handrails. First and last steps. Where the edge of each step is. Use tools that help you move around (mobility aids) if they are needed. These include: Canes. Walkers. Scooters. Crutches. Turn on the lights when you go into a dark area. Replace any light bulbs as soon as they burn out. Set up your furniture so you have a clear path. Avoid moving your furniture around. If any of your floors are uneven, fix them. If there are any pets around you, be aware of where they are. Review your medicines with your doctor. Some medicines can make you feel dizzy. This can increase your chance of falling. Ask your doctor what other things that you can do to help prevent falls. This information is not intended to replace advice given to you by your health care provider. Make sure you discuss any questions you have with your health care provider. Document Released: 09/28/2009 Document Revised: 05/09/2016 Document Reviewed: 01/06/2015 Elsevier Interactive Patient Education  2017 [employer].

## 2021-07-16 ENCOUNTER — Inpatient Hospital Stay: Payer: Medicare Other | Attending: Oncology

## 2021-07-16 ENCOUNTER — Other Ambulatory Visit: Payer: Self-pay

## 2021-07-16 ENCOUNTER — Inpatient Hospital Stay: Payer: Medicare Other | Admitting: Oncology

## 2021-07-16 VITALS — BP 110/83 | HR 69 | Temp 97.0°F | Resp 18 | Wt 156.2 lb

## 2021-07-16 DIAGNOSIS — F419 Anxiety disorder, unspecified: Secondary | ICD-10-CM

## 2021-07-16 DIAGNOSIS — Z853 Personal history of malignant neoplasm of breast: Secondary | ICD-10-CM | POA: Diagnosis not present

## 2021-07-16 DIAGNOSIS — Z9221 Personal history of antineoplastic chemotherapy: Secondary | ICD-10-CM | POA: Diagnosis not present

## 2021-07-16 DIAGNOSIS — M810 Age-related osteoporosis without current pathological fracture: Secondary | ICD-10-CM | POA: Diagnosis not present

## 2021-07-16 DIAGNOSIS — Z170 Estrogen receptor positive status [ER+]: Secondary | ICD-10-CM

## 2021-07-16 DIAGNOSIS — M62838 Other muscle spasm: Secondary | ICD-10-CM

## 2021-07-16 DIAGNOSIS — Z923 Personal history of irradiation: Secondary | ICD-10-CM | POA: Diagnosis not present

## 2021-07-16 DIAGNOSIS — Z87891 Personal history of nicotine dependence: Secondary | ICD-10-CM | POA: Diagnosis not present

## 2021-07-16 DIAGNOSIS — F321 Major depressive disorder, single episode, moderate: Secondary | ICD-10-CM

## 2021-07-16 DIAGNOSIS — C50912 Malignant neoplasm of unspecified site of left female breast: Secondary | ICD-10-CM | POA: Diagnosis not present

## 2021-07-16 DIAGNOSIS — C50212 Malignant neoplasm of upper-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 breast: Secondary | ICD-10-CM

## 2021-07-16 DIAGNOSIS — E119 Type 2 diabetes mellitus without complications: Secondary | ICD-10-CM

## 2021-07-16 DIAGNOSIS — C50911 Malignant neoplasm of unspecified site of right female breast: Secondary | ICD-10-CM | POA: Diagnosis not present

## 2021-07-16 LAB — CBC WITH DIFFERENTIAL/PLATELET
Abs Immature Granulocytes: 0 10*3/uL (ref 0.00–0.07)
Basophils Absolute: 0.1 10*3/uL (ref 0.0–0.1)
Basophils Relative: 1 %
Eosinophils Absolute: 0.2 10*3/uL (ref 0.0–0.5)
Eosinophils Relative: 4 %
HCT: 36.7 % (ref 36.0–46.0)
Hemoglobin: 12.4 g/dL (ref 12.0–15.0)
Immature Granulocytes: 0 %
Lymphocytes Relative: 21 %
Lymphs Abs: 1.3 10*3/uL (ref 0.7–4.0)
MCH: 29.2 pg (ref 26.0–34.0)
MCHC: 33.8 g/dL (ref 30.0–36.0)
MCV: 86.4 fL (ref 80.0–100.0)
Monocytes Absolute: 0.5 10*3/uL (ref 0.1–1.0)
Monocytes Relative: 9 %
Neutro Abs: 4 10*3/uL (ref 1.7–7.7)
Neutrophils Relative %: 65 %
Platelets: 234 10*3/uL (ref 150–400)
RBC: 4.25 MIL/uL (ref 3.87–5.11)
RDW: 13.8 % (ref 11.5–15.5)
WBC: 6.1 10*3/uL (ref 4.0–10.5)
nRBC: 0 % (ref 0.0–0.2)

## 2021-07-16 LAB — COMPREHENSIVE METABOLIC PANEL
ALT: 26 U/L (ref 0–44)
AST: 19 U/L (ref 15–41)
Albumin: 3.6 g/dL (ref 3.5–5.0)
Alkaline Phosphatase: 85 U/L (ref 38–126)
Anion gap: 8 (ref 5–15)
BUN: 18 mg/dL (ref 8–23)
CO2: 27 mmol/L (ref 22–32)
Calcium: 9.8 mg/dL (ref 8.9–10.3)
Chloride: 108 mmol/L (ref 98–111)
Creatinine, Ser: 0.85 mg/dL (ref 0.44–1.00)
GFR, Estimated: >60 mL/min (ref >60)
Glucose, Bld: 100 mg/dL — ABNORMAL HIGH (ref 70–99)
Potassium: 3.9 mmol/L (ref 3.5–5.1)
Sodium: 143 mmol/L (ref 135–145)
Total Bilirubin: 0.4 mg/dL (ref 0.3–1.2)
Total Protein: 6.7 g/dL (ref 6.5–8.1)

## 2021-07-16 NOTE — Progress Notes (Signed)
[place]  [phone] [phone]     ID: Beverlyann Broxterman DOB: 1946/01/19  MR#: [id number]  BTD#:[id number]  Patient Care Team: Biagio Borg, MD as PCP - General (Internal Medicine) Janae Bonser, Virgie Dad, MD as Consulting Physician (Oncology) Kyung Rudd, MD as Consulting Physician (Radiation Oncology) Stark Klein, MD as Consulting Physician (General Surgery) [place], [facility] as Consulting Physician (Ophthalmology) OTHER MD:  CHIEF COMPLAINT: bilateral breast cancer (s/p mastectomies)  CURRENT TREATMENT: Anastrozole   INTERVAL HISTORY: Vi returns today for follow-up of her estrogen receptor positive breast cancer.  She is accompanied by her twin sister  Since her last visit here she had a fall and had extensive films including CT scan of the head and cervical spine, plain films of the complete lumbar spine and left shoulders and both hips, none of which showed evidence of metastatic disease.  There was also no fracture.  There was significant degenerative disease.  Also on 02/27/2021 she underwent MRI of the abdomen to further characterize a complex left upper pole renal lesion.  This showed a Bosniak category 2 cystic lesion which is benign and requires no further evaluation.  It also showed moderate diffuse hepatic steatosis.  Again there was no evidence of metastatic disease.  She continues on anastrozole.  Hot flashes and vaginal dryness are not issues for her  Her most recent bone density screening on 07/28/2020 showed a T-score of -1.9, which is considered osteopenic. This is improved from T-score of -2.2 in 07/2018.   REVIEW OF SYSTEMS: Vi says "I am good".  She does not exercise regularly but [place] [facility] [place] and she walked around for 2 hours.  She says when she walks or exercises she gets short of breath.  She has not had significant problems with cough pleurisy or phlegm production.  She still has  discomfort in the right axilla sometimes but understands this is related to her prior treatment and does not indicate breast cancer recurrence.  A detailed review of systems today was otherwise stable.   COVID 19 VACCINATION STATUS: refuses vaccination; has not had COVID as of August 2022   BREAST CANCER HISTORY: From the original intake note:  "Vi" herself palpated a mass in her right breast sometime in November 2016. She brought it to Dr. Purcell Nails attention on 11/20/2015 and he set her up for bilateral diagnostic mammography with tomography and I lateral [facility]. The breast density was category B. In the right breast upper outer quadrant there where 2 nodules, at the 9:00 position (which by ultrasound was spiculated and measured 1.7 cm) and at the 11:00 position (which by ultrasound measured 0.7 cm. The right axilla showed a grossly abnormal lymph node measuring 1.4 cm with complete effacement of the hilum.there were several other small lymph nodes with cortical thickening in the lower right axilla as well.  Biopsy of the right breast mass on 12/12/2015 showed ([id number]) and invasive ductal carcinoma, grade 2, estrogen receptor 100% positive, progesterone receptor 80% positive, both with strong staining intensity, with an MIB-1 of 20%, and HER-2 nonamplified, with a signals ratio of 1.33 and number per cell 3.25.  In the left breast mammographically there was a multifocal mass centered at the 12:00 location with suspicious calcifications. The processes measured 6.7 cm altogether. A dilated duct extended to the nipple from this area, containing microcalcifications. Ultrasonography of the left breast found a multifocal fragmented mass superiorly, the  main component being hypoechoic and irregular and tolerable than wide, measuring 2.1 cm.superior to that, there was a second mass measuring 1.9 cm and there were other suspicious solid nodules in the  adjacent parenchyma. There was also a separate area of microcalcifications containing a suspicious nodule at the 1:00 position. This measured 0.8 cm. The ultrasound confirmed a dilated duct extending from the superior process to the nipple. An alteration of the left axilla showed a grossly abnormal lymph node measuring up to 1 cm and a second indeterminant lymph node.  Biopsy of the left breast 11:30 o'clock mass showed invasive ductal carcinoma, grade 2, estrogen receptor 100% positive, progesterone receptor 70% positive, with an MIB-1 of 20%, and no HER-2 amplification, the signals ratio being 1.59 and the number per cell 3.90. A second area in the breast described as retroareolar showed ductal carcinoma, possibly in situ. This was also estrogen receptor positive at 100%, progesterone receptor positive at 60%, with an MIB-1 of 5%, and HER-2 negative, with a signals ratio of 1.73 and number per cell 3.55. Biopsy of the left breast lymph node was positiveand the prognostic panel there was very similar to all the others, namely estrogen receptor 100% positive, progesterone receptor 40% positive, with an MIB-1 of 40%, and HER-2 no amplification, with a signals ratio of 1.52 and number per cell 3.85.  The patient's subsequent history is as detailed below.   PAST MEDICAL HISTORY: Past Medical History:  Diagnosis Date   Allergy    Anemia    as a child   Anxiety    Arthritis    "lower back" (01/25/2016)   Bilateral breast cancer ([place])    [facility] 01/25/2016   Cataract    Depression    Esophageal diverticulum    GERD (gastroesophageal reflux disease)    Hyperlipidemia    Hypertension    Leg cramps    takes Flexeril   Migraine    "maybe monthly" (01/25/2016)   Osteoporosis    Pneumonia ~ 2005 X 1   Primary cancer of 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 breast ([place]) 12/14/2015   Rheumatic fever 1950s    PAST SURGICAL HISTORY: Past Surgical History:  Procedure Laterality Date   BREAST BIOPSY  Bilateral 2016   CATARACT EXTRACTION W/ INTRAOCULAR LENS  IMPLANT, BILATERAL Bilateral 05/2015-06/2015   CESAREAN SECTION  1974; 1976   COLONOSCOPY     ESOPHAGEAL MANOMETRY N/A 03/10/2017   Procedure: ESOPHAGEAL MANOMETRY (EM);  Surgeon: Ladene Artist, MD;  Location: [facility];  Service: Endoscopy;  Laterality: N/A;   EYE MUSCLE SURGERY Bilateral 1950s   for cross eyes as a child   INSERTION OF MESH N/A 09/12/2015   Procedure: INSERTION OF MESH;  Surgeon: Erroll Luna, MD;  Location: [facility];  Service: General;  Laterality: N/A;   MASTECTOMY COMPLETE / SIMPLE W/ SENTINEL NODE BIOPSY Right 01/25/2016   MASTECTOMY MODIFIED RADICAL Left 01/25/2016   MASTECTOMY W/ SENTINEL NODE BIOPSY Bilateral 01/25/2016   Procedure: RIGHT MASTECTOMY WITH SENTINEL RIGHT LYMPH NODE BIOPSY, LEFT MODIFIED RADICAL MASTECTOMY;  Surgeon: Stark Klein, MD;  Location: [place] [facility];  Service: General;  Laterality: Bilateral;   TONSILLECTOMY  [id number]   UMBILICAL HERNIA REPAIR N/A 09/12/2015   Procedure: REPAIR OF INCISIONAL AND UMBILICAL HERNIA;  Surgeon: Erroll Luna, MD;  Location: [facility];  Service: General;  Laterality: N/A;    FAMILY HISTORY Family History  Problem Relation Age of Onset   Healthy Mother    Other Mother        +"  throat polyps"; +smoker   Lymphoma Father        d. 40   Diabetes Father    Heart Problems Father    Other Father        "tumors on his kidneys"   Breast cancer Sister        dx. metachronous bilateral breast cancers at 67 and 80; - BRCA1/2 testing   Cancer Maternal Grandmother 89       dx. spinal cancer   Stroke Maternal Grandfather    Gout Paternal Grandmother    Gout Paternal Grandfather    Lung cancer Maternal Uncle        d. 46-72; +smoker   Diabetes Paternal Aunt    Diabetes Paternal Uncle    Breast cancer Cousin        maternal 1st cousin dx. unspecified age   Lung cancer Cousin        maternal 1st cousin dx. lung cancer; +smoker   Lupus Cousin    Breast cancer Cousin         paternal 1st cousin dx. 7s   Breast cancer Cousin        paternal 1st cousin dx. 68s-30s   Cancer Cousin        paternal 1st cousin dx. oral cancer with metastasis, also "lost a leg to cancer"; dx. 10s; +EtOH abuse   Breast cancer Cousin        paternal 1st cousin, once-removed dx. at unspecified age   Colon cancer Neg Hx    Esophageal cancer Neg Hx    Rectal cancer Neg Hx    Stomach cancer Neg Hx   the patient's father died at the age of 29 from complications of diabetes. He had a history of lymphoma. The patient's mother is currently living at age 65. The patient's brother died in an automobile accident. The patient has a twin sister,  -Animator. She has a history of bilateral breast cancers and did undergo genetic testing in November 2012, with no BRCA1 or 2 mutation found   GYNECOLOGIC HISTORY:  No LMP recorded. Patient is postmenopausal. Menarche age 60, first live birth age 47. The patient is GX P2. She stopped having periods approximately 1997. She did not take hormone replacement. She took oral contraceptives remotely for approximately 3 years, with no complications.   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 She is divorced and lives at home with her sister Joycelyn Schmid, and with 2 nephews, Rush Landmark, 55, who is currently employed and Blair, 63, who is disabled. The patient has 3 grandchildren. She is not a church attender    ADVANCED DIRECTIVES: Not in place. At the initial clinic visit  the patient was given the appropriate forms to complete and notarize at her discretion.   HEALTH MAINTENANCE: Social History   Tobacco Use   Smoking status: Former    Packs/day: 1.00    Years: 0.00    Pack years: 0.00    Types: Cigarettes    Quit date: 12/24/2015    Years since quitting: 5.5   Smokeless tobacco: Never  Vaping Use   Vaping Use: Never used  Substance Use Topics   Alcohol use: No     Alcohol/week: 0.0 standard drinks    Comment: 07/31/16 pt states she dosn't drink anymore,04/16/2016 "1/2 bottle wine per month" previously - not much now   Drug use: No     Colonoscopy: 09/06/2016  PAP: 2010  Bone density: 07/26/2016 showed a T score of -2.5 osteoporosis   Lipid panel:  Allergies  Allergen Reactions   Multihance [Gadobenate] Itching, Rash and Other (See Comments)    Pt developed immediate itching, red eyes and rash after MR contrast. Subsided with 25 mg of benadryl. Pt will need 13 hr prep prior to future studies. 02/27/21    Current Outpatient Medications  Medication Sig Dispense Refill   ACCU-CHEK GUIDE test strip USE AS DIRECTED ONCE DAILY 100 each 0   albuterol (VENTOLIN HFA) 108 (90 Base) MCG/ACT inhaler Inhale 2 puffs into the lungs every 6 (six) hours as needed for wheezing or shortness of breath. 18 g 11   ALPRAZolam (XANAX) 0.5 MG tablet Take 1 tablet 1 hour prior to your MRI test.  Can repeat in 1 hour if no effect. 2 tablet 0   Artificial Tear Ointment (DRY EYES OP) Apply 1 drop to eye daily as needed (for dry eyes).      atorvastatin (LIPITOR) 10 MG tablet Take 1 tablet by mouth once daily 90 tablet 3   Calcium-Phosphorus-Vitamin D (CITRACAL +D3 PO) Take 2 tablets by mouth 2 (two) times daily.      Fluticasone-Salmeterol (ADVAIR) 250-50 MCG/DOSE AEPB Inhale 1 puff into the lungs in the morning and at bedtime. 60 each 11   hydrochlorothiazide (HYDRODIURIL) 25 MG tablet Take 1 tablet by mouth once daily 90 tablet 0   ibuprofen (ADVIL) 800 MG tablet Take 800 mg by mouth every 6 (six) hours as needed.     losartan (COZAAR) 50 MG tablet Take 1 tablet by mouth once daily 90 tablet 0   Multiple Vitamins-Minerals (MULTIVITAMIN WITH MINERALS) tablet Take 1 tablet by mouth daily. Reported on 12/20/2015     pantoprazole (PROTONIX) 40 MG tablet TAKE 1 TABLET BY MOUTH TWICE DAILY BEFORE MEAL(S) 60 tablet 11   venlafaxine XR (EFFEXOR-XR) 75 MG 24 hr capsule TAKE 1 CAPSULE BY  MOUTH ONCE DAILY WITH BREAKFAST 90 capsule 0   zolpidem (AMBIEN) 5 MG tablet Take 1 tablet (5 mg total) by mouth at bedtime as needed for sleep. 30 tablet 5   No current facility-administered medications for this visit.    OBJECTIVE: White woman who appears stated age  There were no vitals filed for this visit.    There is no height or weight on file to calculate BMI.    ECOG FS:1 - Symptomatic but completely ambulatory   Sclerae unicteric, EOMs intact Wearing a mask No cervical or supraclavicular adenopathy Lungs no rales or rhonchi Heart regular rate and rhythm Abd soft, obese, nontender, positive bowel sounds MSK mild kyphosis but no focal spinal tenderness, no upper extremity lymphedema Neuro: nonfocal, well oriented, appropriate affect Breasts: Status post bilateral mastectomies.  There is no evidence of chest wall recurrence.  Both axillae are benign.   LAB RESULTS:  CMP     Component Value Date/Time   NA 140 05/31/2021 1609   NA 141 11/04/2017 0952   K 3.8 05/31/2021 1609   K 3.9 11/04/2017 0952   CL 106 05/31/2021 1609   CO2 29 05/31/2021 1609   CO2 26 11/04/2017 0952   GLUCOSE 109 (H) 05/31/2021 1609   GLUCOSE 72 11/04/2017 0952   BUN 16 05/31/2021 1609   BUN 20.5 11/04/2017 0952   CREATININE 0.72 05/31/2021 1609   CREATININE 0.89 08/03/2020 1443   CREATININE 0.9 11/04/2017 0952   CALCIUM 9.9 05/31/2021 1609   CALCIUM 10.7 (H) 11/04/2017 7703  PROT 7.2 05/31/2021 1609   PROT 8.0 11/04/2017 0952   ALBUMIN 4.1 05/31/2021 1609   ALBUMIN 3.9 11/04/2017 0952   AST 15 05/31/2021 1609   AST 13 (L) 11/03/2018 1102   AST 29 11/04/2017 0952   ALT 20 05/31/2021 1609   ALT 18 11/03/2018 1102   ALT 37 11/04/2017 0952   ALKPHOS 82 05/31/2021 1609   ALKPHOS 81 11/04/2017 0952   BILITOT 0.4 05/31/2021 1609   BILITOT 0.5 11/03/2018 1102   BILITOT 0.34 11/04/2017 0952   GFRNONAA >60 09/12/2020 0949   GFRNONAA 64 08/03/2020 1443   GFRAA >60 09/12/2020 0949   GFRAA  75 08/03/2020 1443    INo results found for: SPEP, UPEP  Lab Results  Component Value Date   WBC 6.1 07/16/2021   NEUTROABS 4.0 07/16/2021   HGB 12.4 07/16/2021   HCT 36.7 07/16/2021   MCV 86.4 07/16/2021   PLT 234 07/16/2021      Chemistry      Component Value Date/Time   NA 140 05/31/2021 1609   NA 141 11/04/2017 0952   K 3.8 05/31/2021 1609   K 3.9 11/04/2017 0952   CL 106 05/31/2021 1609   CO2 29 05/31/2021 1609   CO2 26 11/04/2017 0952   BUN 16 05/31/2021 1609   BUN 20.5 11/04/2017 0952   CREATININE 0.72 05/31/2021 1609   CREATININE 0.89 08/03/2020 1443   CREATININE 0.9 11/04/2017 0952      Component Value Date/Time   CALCIUM 9.9 05/31/2021 1609   CALCIUM 10.7 (H) 11/04/2017 0952   ALKPHOS 82 05/31/2021 1609   ALKPHOS 81 11/04/2017 0952   AST 15 05/31/2021 1609   AST 13 (L) 11/03/2018 1102   AST 29 11/04/2017 0952   ALT 20 05/31/2021 1609   ALT 18 11/03/2018 1102   ALT 37 11/04/2017 0952   BILITOT 0.4 05/31/2021 1609   BILITOT 0.5 11/03/2018 1102   BILITOT 0.34 11/04/2017 0952       No results found for: LABCA2  No components found for: LABCA125  No results for input(s): INR in the last 168 hours.  Urinalysis    Component Value Date/Time   COLORURINE YELLOW 02/05/2021 1143   APPEARANCEUR CLEAR 02/05/2021 1143   LABSPEC 1.020 02/05/2021 1143   PHURINE 7.0 02/05/2021 1143   GLUCOSEU NEGATIVE 02/05/2021 1143   HGBUR NEGATIVE 02/05/2021 1143   BILIRUBINUR NEGATIVE 02/05/2021 1143   KETONESUR NEGATIVE 02/05/2021 1143   PROTEINUR NEGATIVE 08/03/2020 1443   UROBILINOGEN 0.2 02/05/2021 1143   NITRITE NEGATIVE 02/05/2021 1143   LEUKOCYTESUR NEGATIVE 02/05/2021 1143    STUDIES: No results found.   ASSESSMENT: 75 y.o. BRCA negative  woman  (1) status post bilateral breast biopsies and left axillary lymph node biopsy 12/12/2015, showing  (a) on the Right upper-outer quadrant, a clinical mT1c N1, stage IIA, estrogen and progesterone  receptor positive, HER-2 not amplified, with an MIB-1 of 20%  (b) on the left upper inner quadrant a clinical mT2 pN1, stage IIB invasive ductal carcinoma, grade 2 over 3, estrogen and progesterone receptor positive, HER-2 not amplified, with MIB-1 between 5 and 40%  (c) chest CT and bone scan 01/04/2016 showed no evidence of metastatic disease. They did suggest a large esophageal diverticulum  (2) Status post right mastectomy with sentinel lymph node sampling and left modified radical mastectomy 01/25/2016  (a) right side: pT2, pN1, stage IIB invasive ductal carcinoma, grade 2, with negative margins  (b) left side: pT3 pN1, stage IIIA invasive ductal carcinoma,  grade 2, with negative margins  (c) both cancers were estrogen and progesterone receptor positive, HER-2 nonamplified, with MIB-1 between 5 and 20%   (3) Mammaprint on both breast cancers came back "low risk" predicting a 5 year distant metastasis free survival in the 96% range   (4) no chemotherapy and no right-sided axillary lymph node dissection recommended at conference 02/21/2016  (5) adjuvant radiation completed 05/06/2016   (6) anastrozole started 06/18/2016, completing 5 years August 2022  (a) bone density 07/26/2016 shows osteoporosis with a T score of -2.5.  (b) received denosumab/Prolia 09/04/2016 through 05/05/2018 (4 doses  (c) bone density 07/28/2020 shows a T score of -1.9.  (7) genetics testing 04/26/2016 through the [facility] through [employer], MD) found no deleterious mutations in ATM, BARD1, BRCA1, BRCA2, BRIP1, CDH1, CHEK2, FANCC, MLH1, MSH2, MSH6, NBN, PALB2, PMS2, PTEN, RAD51C, RAD51D, TP53, and XRCC2.  This panel also includes deletion/duplication analysis (without sequencing) for one gene, EPCAM.   PLAN: Vi is now 5 and half years out from definitive surgery for her breast cancer with no evidence of disease recurrence.  This is very favorable.  She is  completing 5 years on anastrozole today.  She has tolerated this well without significant worsening of her bone density.  I think over the next couple of months she might feel she has a little bit more energy from coming off this medication.  At this point I feel comfortable releasing her back to her primary care physician.  All she will need in terms of breast cancer follow-up is a yearly physician chest wall exam.  I will be glad to see her again at any point in the future if and when the need arises but as of now are making no further routine appointments for her here.  Total encounter time 25 minutes.Sarajane Jews C. Purvis Sidle, MD 07/16/21 2:47 PM Medical Oncology and Hematology [facility] [place], [place] [address] Tel. [phone]    Fax. [phone]   I, Wilburn Mylar, am acting as scribe for Dr. Virgie Dad. Taffany Heiser.  I, Lurline Del MD, have reviewed the above documentation for accuracy and completeness, and I agree with the above.   *Total Encounter Time as defined by the Centers for Medicare and Medicaid Services includes, in addition to the face-to-face time of a patient visit (documented in the note above) non-face-to-face time: obtaining and reviewing outside history, ordering and reviewing medications, tests or procedures, care coordination (communications with other health care professionals or caregivers) and documentation in the medical record.

## 2021-07-19 ENCOUNTER — Other Ambulatory Visit: Payer: Self-pay | Admitting: Internal Medicine

## 2021-07-22 DIAGNOSIS — U071 COVID-19: Secondary | ICD-10-CM | POA: Diagnosis not present

## 2021-07-22 DIAGNOSIS — R509 Fever, unspecified: Secondary | ICD-10-CM | POA: Diagnosis not present

## 2021-07-22 DIAGNOSIS — J449 Chronic obstructive pulmonary disease, unspecified: Secondary | ICD-10-CM | POA: Diagnosis not present

## 2021-07-23 ENCOUNTER — Telehealth: Payer: Self-pay | Admitting: Internal Medicine

## 2021-07-23 NOTE — Telephone Encounter (Signed)
Sister calling to report patient tested covid+, results from [facility]

## 2021-07-24 NOTE — Telephone Encounter (Signed)
Spoke with pt and has stated she does not need any meds for her Pos COVID results as [facility] has Rx'd her medications.

## 2021-07-24 NOTE — Telephone Encounter (Signed)
Ok to let us know if pt needs to be treated

## 2021-07-26 DIAGNOSIS — J449 Chronic obstructive pulmonary disease, unspecified: Secondary | ICD-10-CM | POA: Diagnosis not present

## 2021-07-26 DIAGNOSIS — Z9189 Other specified personal risk factors, not elsewhere classified: Secondary | ICD-10-CM | POA: Diagnosis not present

## 2021-07-26 DIAGNOSIS — U071 COVID-19: Secondary | ICD-10-CM | POA: Diagnosis not present

## 2021-07-30 ENCOUNTER — Other Ambulatory Visit: Payer: Self-pay | Admitting: Oncology

## 2021-07-30 DIAGNOSIS — F321 Major depressive disorder, single episode, moderate: Secondary | ICD-10-CM

## 2021-07-30 DIAGNOSIS — F419 Anxiety disorder, unspecified: Secondary | ICD-10-CM

## 2021-08-01 ENCOUNTER — Other Ambulatory Visit: Payer: Self-pay | Admitting: Oncology

## 2021-08-01 DIAGNOSIS — F419 Anxiety disorder, unspecified: Secondary | ICD-10-CM

## 2021-08-01 DIAGNOSIS — F321 Major depressive disorder, single episode, moderate: Secondary | ICD-10-CM

## 2021-08-03 ENCOUNTER — Other Ambulatory Visit: Payer: Self-pay | Admitting: Oncology

## 2021-08-03 ENCOUNTER — Other Ambulatory Visit: Payer: Self-pay | Admitting: Internal Medicine

## 2021-08-03 DIAGNOSIS — F321 Major depressive disorder, single episode, moderate: Secondary | ICD-10-CM

## 2021-08-03 DIAGNOSIS — F419 Anxiety disorder, unspecified: Secondary | ICD-10-CM

## 2021-08-06 ENCOUNTER — Encounter: Payer: Self-pay | Admitting: Oncology

## 2021-08-08 ENCOUNTER — Ambulatory Visit: Payer: Medicare Other | Admitting: Internal Medicine

## 2021-08-08 ENCOUNTER — Other Ambulatory Visit: Payer: Self-pay

## 2021-08-08 ENCOUNTER — Encounter: Payer: Self-pay | Admitting: Internal Medicine

## 2021-08-08 VITALS — BP 120/70 | HR 60 | Temp 98.4°F | Ht 60.0 in | Wt 154.0 lb

## 2021-08-08 DIAGNOSIS — E781 Pure hyperglyceridemia: Secondary | ICD-10-CM

## 2021-08-08 DIAGNOSIS — I10 Essential (primary) hypertension: Secondary | ICD-10-CM

## 2021-08-08 DIAGNOSIS — E1165 Type 2 diabetes mellitus with hyperglycemia: Secondary | ICD-10-CM

## 2021-08-08 DIAGNOSIS — G43809 Other migraine, not intractable, without status migrainosus: Secondary | ICD-10-CM | POA: Diagnosis not present

## 2021-08-08 MED ORDER — RIZATRIPTAN BENZOATE 10 MG PO TBDP: 10.0000 mg | ORAL_TABLET | ORAL | 11 refills | Status: DC | PRN

## 2021-08-08 NOTE — Patient Instructions (Signed)
Please take all new medication as prescribed - the generic maxalt for migraine  Please consider having the new Novavax covid vaccine in October when it arrives  Please continue all other medications as before, and refills have been done if requested.  Please have the pharmacy call with any other refills you may need.  Please continue your efforts at being more active, low cholesterol diet, and weight control.  Please keep your appointments with your specialists as you may have planned  We can hold on lab testing today  Please make an Appointment to return in 6 months, or sooner if needed

## 2021-08-08 NOTE — Progress Notes (Signed)
Patient ID: Kristen Howe, female   DOB: 05/10/1946, 75 y.o.   MRN: [id number]        Chief Complaint: follow up HTN, HLD and hyperglycemia, migraine       HPI:  Kristen Howe is a 75 y.o. female here with 1 month worsening recurrent left sided HA with throbbing and nausea, without vision change, vomiting, but worse with exertion, often mod to severe intermittent.   Also, S/p covid infection with HA, nausea, cough and sob aprpox 2 wks ago tx with zpack, vit c, D and zinc. Not offered paxlovid. Pt still with fatigue. And scant prod cough, white.  Feels overall improved,inhaler helping as well.  Pt denies chest pain, increased sob or doe, wheezing, orthopnea, PND, increased LE swelling, palpitations, dizziness or syncope.   Pt denies polydipsia, polyuria, or new focal neuro s/s.   Pt denies fever, wt loss, night sweats, loss of appetite, or other constitutional symptoms  Wt Readings from Last 3 Encounters:  08/08/21 154 lb (69.9 kg)  07/16/21 156 lb 3.2 oz (70.9 kg)  07/11/21 157 lb 9.6 oz (71.5 kg)   BP Readings from Last 3 Encounters:  08/08/21 120/70  07/16/21 110/83  07/11/21 116/70         Past Medical History:  Diagnosis Date   Allergy    Anemia    as a child   Anxiety    Arthritis    "lower back" (01/25/2016)   Bilateral breast cancer ([place])    Archie Endo 01/25/2016   Cataract    Depression    Esophageal diverticulum    GERD (gastroesophageal reflux disease)    Hyperlipidemia    Hypertension    Leg cramps    takes Flexeril   Migraine    "maybe monthly" (01/25/2016)   Osteoporosis    Pneumonia ~ 2005 X 1   Primary cancer of 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 breast ([place]) 12/14/2015   Rheumatic fever 1950s   Past Surgical History:  Procedure Laterality Date   BREAST BIOPSY Bilateral 2016   CATARACT EXTRACTION W/ INTRAOCULAR LENS  IMPLANT, BILATERAL Bilateral 05/2015-06/2015   CESAREAN SECTION  1974; 1976   COLONOSCOPY     ESOPHAGEAL MANOMETRY N/A 03/10/2017   Procedure:  ESOPHAGEAL MANOMETRY (EM);  Surgeon: Ladene Artist, MD;  Location: [facility];  Service: Endoscopy;  Laterality: N/A;   EYE MUSCLE SURGERY Bilateral 1950s   for cross eyes as a child   INSERTION OF MESH N/A 09/12/2015   Procedure: INSERTION OF MESH;  Surgeon: Erroll Luna, MD;  Location: [place];  Service: General;  Laterality: N/A;   MASTECTOMY COMPLETE / SIMPLE W/ SENTINEL NODE BIOPSY Right 01/25/2016   MASTECTOMY MODIFIED RADICAL Left 01/25/2016   MASTECTOMY W/ SENTINEL NODE BIOPSY Bilateral 01/25/2016   Procedure: RIGHT MASTECTOMY WITH SENTINEL RIGHT LYMPH NODE BIOPSY, LEFT MODIFIED RADICAL MASTECTOMY;  Surgeon: Stark Klein, MD;  Location: [place];  Service: General;  Laterality: Bilateral;   TONSILLECTOMY  [id number]   UMBILICAL HERNIA REPAIR N/A 09/12/2015   Procedure: REPAIR OF INCISIONAL AND UMBILICAL HERNIA;  Surgeon: Erroll Luna, MD;  Location: [place];  Service: General;  Laterality: N/A;    reports that she quit smoking about 5 years ago. Her smoking use included cigarettes. She smoked an average of 1.00 packs per day. She has never used smokeless tobacco. She reports that she does not drink alcohol and does not use drugs. family history includes Breast cancer in her cousin, cousin, cousin, cousin, and sister; Cancer in her  cousin; Cancer (age of onset: 20) in her maternal grandmother; Diabetes in her father, paternal aunt, and paternal uncle; Gout in her paternal grandfather and paternal grandmother; Healthy in her mother; Heart Problems in her father; Lung cancer in her cousin and maternal uncle; Lupus in her cousin; Lymphoma in her father; Other in her father and mother; Stroke in her maternal grandfather. Allergies  Allergen Reactions   Multihance [Gadobenate] Itching, Rash and Other (See Comments)    Pt developed immediate itching, red eyes and rash after MR contrast. Subsided with 25 mg of benadryl. Pt will need 13 hr prep prior to future studies. 02/27/21   Current Outpatient Medications  on File Prior to Visit  Medication Sig Dispense Refill   ACCU-CHEK GUIDE test strip USE AS DIRECTED ONCE DAILY 100 each 0   albuterol (VENTOLIN HFA) 108 (90 Base) MCG/ACT inhaler Inhale 2 puffs into the lungs every 6 (six) hours as needed for wheezing or shortness of breath. 18 g 11   Artificial Tear Ointment (DRY EYES OP) Apply 1 drop to eye daily as needed (for dry eyes).      atorvastatin (LIPITOR) 10 MG tablet Take 1 tablet by mouth once daily 90 tablet 3   Calcium-Phosphorus-Vitamin D (CITRACAL +D3 PO) Take 2 tablets by mouth 2 (two) times daily.      Fluticasone-Salmeterol (ADVAIR) 250-50 MCG/DOSE AEPB Inhale 1 puff into the lungs in the morning and at bedtime. 60 each 11   hydrochlorothiazide (HYDRODIURIL) 25 MG tablet Take 1 tablet by mouth once daily 90 tablet 0   ibuprofen (ADVIL) 800 MG tablet Take 800 mg by mouth every 6 (six) hours as needed.     losartan (COZAAR) 50 MG tablet Take 1 tablet by mouth once daily 90 tablet 0   Multiple Vitamins-Minerals (MULTIVITAMIN WITH MINERALS) tablet Take 1 tablet by mouth daily. Reported on 12/20/2015     pantoprazole (PROTONIX) 40 MG tablet TAKE 1 TABLET BY MOUTH TWICE DAILY BEFORE MEAL(S) 60 tablet 0   venlafaxine XR (EFFEXOR-XR) 75 MG 24 hr capsule TAKE 1 CAPSULE BY MOUTH ONCE DAILY WITH BREAKFAST 90 capsule 0   No current facility-administered medications on file prior to visit.        ROS:  All others reviewed and negative.  Objective        PE:  BP 120/70 (BP Location: Left Arm, Patient Position: Sitting, Cuff Size: Large)   Pulse 60   Temp 98.4 F (36.9 C) (Oral)   Ht 5' (1.524 m)   Wt 154 lb (69.9 kg)   SpO2 96%   BMI 30.08 kg/m                 Constitutional: Pt appears in NAD               HENT: Head: NCAT.                Right Ear: External ear normal.                 Left Ear: External ear normal.                Eyes: . Pupils are equal, round, and reactive to light. Conjunctivae and EOM are normal               Nose:  without d/c or deformity               Neck: Neck supple. Gross normal ROM  Cardiovascular: Normal rate and regular rhythm.                 Pulmonary/Chest: Effort normal and breath sounds without rales or wheezing.                Abd:  Soft, NT, ND, + BS, no organomegaly               Neurological: Pt is alert. At baseline orientation, motor grossly intact               Skin: Skin is warm. No rashes, no other new lesions, LE edema - none               Psychiatric: Pt behavior is normal without agitation   Micro: none  Cardiac tracings I have personally interpreted today:  none  Pertinent Radiological findings (summarize): none   Lab Results  Component Value Date   WBC 6.1 07/16/2021   HGB 12.4 07/16/2021   HCT 36.7 07/16/2021   PLT 234 07/16/2021   GLUCOSE 100 (H) 07/16/2021   CHOL 127 05/31/2021   TRIG 195.0 (H) 05/31/2021   HDL 35.10 (L) 05/31/2021   LDLDIRECT 137.0 06/17/2018   LDLCALC 53 05/31/2021   ALT 26 07/16/2021   AST 19 07/16/2021   NA 143 07/16/2021   K 3.9 07/16/2021   CL 108 07/16/2021   CREATININE 0.85 07/16/2021   BUN 18 07/16/2021   CO2 27 07/16/2021   TSH 3.28 02/05/2021   HGBA1C 6.8 (H) 05/31/2021   MICROALBUR <0.7 02/05/2021   Assessment/Plan:  Kristen Howe is a 75 y.o. White or Caucasian [1] female with  has a past medical history of Allergy, Anemia, Anxiety, Arthritis, Bilateral breast cancer ([place]), Cataract, Depression, Esophageal diverticulum, GERD (gastroesophageal reflux disease), Hyperlipidemia, Hypertension, Leg cramps, Migraine, Osteoporosis, Pneumonia (~ 2005 X 1), Primary cancer of 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 breast ([place]) (12/14/2015), and Rheumatic fever (1950s).  Hypertriglyceridemia Lab Results  Component Value Date   CHOL 127 05/31/2021   HDL 35.10 (L) 05/31/2021   LDLCALC 53 05/31/2021   LDLDIRECT 137.0 06/17/2018   TRIG 195.0 (H) 05/31/2021   CHOLHDL 4 05/31/2021  mild uncontrolled, for lower fat, lower  chol diet,  declilnes other change in tx  Essential hypertension BP Readings from Last 3 Encounters:  08/08/21 120/70  07/16/21 110/83  07/11/21 116/70   Stable, pt to continue medical treatment hct, losartan   Diabetes mellitus ([place]) Lab Results  Component Value Date   HGBA1C 6.8 (H) 05/31/2021   Mild uncontrolled, pt to continue current medical treatment  - diet, decllines other such as metofrmin for now   Migraine Mild worsening in freq and seveirity recently - for maxalt mlt prn,  to f/u any worsening symptoms or concerns  Followup: Return in about 6 months (around 02/08/2022).  Cathlean Cower, MD 08/11/2021 11:54 PM [facility]

## 2021-08-11 ENCOUNTER — Encounter: Payer: Self-pay | Admitting: Internal Medicine

## 2021-08-11 DIAGNOSIS — G43909 Migraine, unspecified, not intractable, without status migrainosus: Secondary | ICD-10-CM | POA: Insufficient documentation

## 2021-08-11 NOTE — Assessment & Plan Note (Signed)
Mild worsening in freq and seveirity recently - for maxalt mlt prn,  to f/u any worsening symptoms or concerns

## 2021-08-11 NOTE — Assessment & Plan Note (Signed)
Lab Results  Component Value Date   HGBA1C 6.8 (H) 05/31/2021   Mild uncontrolled, pt to continue current medical treatment  - diet, decllines other such as metofrmin for now

## 2021-08-11 NOTE — Assessment & Plan Note (Signed)
BP Readings from Last 3 Encounters:  08/08/21 120/70  07/16/21 110/83  07/11/21 116/70   Stable, pt to continue medical treatment hct, losartan

## 2021-08-11 NOTE — Assessment & Plan Note (Signed)
Lab Results  Component Value Date   CHOL 127 05/31/2021   HDL 35.10 (L) 05/31/2021   LDLCALC 53 05/31/2021   LDLDIRECT 137.0 06/17/2018   TRIG 195.0 (H) 05/31/2021   CHOLHDL 4 05/31/2021  mild uncontrolled, for lower fat, lower chol diet,  declilnes other change in tx

## 2021-08-29 ENCOUNTER — Other Ambulatory Visit: Payer: Self-pay | Admitting: Internal Medicine

## 2021-08-29 DIAGNOSIS — I10 Essential (primary) hypertension: Secondary | ICD-10-CM

## 2021-08-29 NOTE — Telephone Encounter (Signed)
Please refill as per office routine med refill policy (all routine meds to be refilled for 3 mo or monthly (per pt preference) up to one year from last visit, then month to month grace period for 3 mo, then further med refills will have to be denied) ? ?

## 2021-08-30 ENCOUNTER — Other Ambulatory Visit: Payer: Self-pay | Admitting: Internal Medicine

## 2021-08-30 DIAGNOSIS — I10 Essential (primary) hypertension: Secondary | ICD-10-CM

## 2021-08-30 MED ORDER — LOSARTAN POTASSIUM 50 MG PO TABS: 50.0000 mg | ORAL_TABLET | Freq: Every day | ORAL | 0 refills | Status: DC

## 2021-08-30 NOTE — Telephone Encounter (Signed)
Please refill as per office routine med refill policy (all routine meds to be refilled for 3 mo or monthly (per pt preference) up to one year from last visit, then month to month grace period for 3 mo, then further med refills will have to be denied) ? ?

## 2021-08-30 NOTE — Addendum Note (Signed)
Addended by: Biagio Borg on: 08/30/2021 08:21 PM   Modules accepted: Orders

## 2021-08-30 NOTE — Telephone Encounter (Signed)
Already refilled yesterday

## 2021-11-11 ENCOUNTER — Other Ambulatory Visit: Payer: Self-pay | Admitting: Adult Health

## 2021-11-11 ENCOUNTER — Other Ambulatory Visit: Payer: Self-pay | Admitting: Internal Medicine

## 2021-11-11 DIAGNOSIS — E119 Type 2 diabetes mellitus without complications: Secondary | ICD-10-CM

## 2021-11-11 NOTE — Telephone Encounter (Signed)
Please refill as per office routine med refill policy (all routine meds to be refilled for 3 mo or monthly (per pt preference) up to one year from last visit, then month to month grace period for 3 mo, then further med refills will have to be denied) ? ?

## 2021-11-15 ENCOUNTER — Encounter: Payer: Self-pay | Admitting: Oncology

## 2021-11-16 ENCOUNTER — Other Ambulatory Visit: Payer: Self-pay | Admitting: Internal Medicine

## 2021-11-16 NOTE — Telephone Encounter (Signed)
Please refill as per office routine med refill policy (all routine meds to be refilled for 3 mo or monthly (per pt preference) up to one year from last visit, then month to month grace period for 3 mo, then further med refills will have to be denied) ? ?

## 2021-11-19 ENCOUNTER — Other Ambulatory Visit: Payer: Self-pay | Admitting: Internal Medicine

## 2021-11-19 NOTE — Telephone Encounter (Signed)
Please refill as per office routine med refill policy (all routine meds to be refilled for 3 mo or monthly (per pt preference) up to one year from last visit, then month to month grace period for 3 mo, then further med refills will have to be denied) ? ?

## 2021-11-23 ENCOUNTER — Telehealth: Payer: Self-pay | Admitting: Internal Medicine

## 2021-11-23 DIAGNOSIS — E119 Type 2 diabetes mellitus without complications: Secondary | ICD-10-CM

## 2021-11-23 MED ORDER — ACCU-CHEK GUIDE VI STRP: 1.0000 | ORAL_STRIP | Freq: Every day | 1 refills | Status: DC

## 2021-11-23 NOTE — Telephone Encounter (Signed)
1.Medication Requested:ACCU-CHEK GUIDE test strip  2. Pharmacy (Name, Street, [place]): [place], [place] - [address]  3. On Med List: yes  4. Last Visit with PCP: 08-08-2021  5. Next visit date with PCP: n/a   Agent: Please be advised that RX refills may take up to 3 business days. We ask that you follow-up with your pharmacy.

## 2022-01-16 ENCOUNTER — Other Ambulatory Visit: Payer: Self-pay

## 2022-01-16 ENCOUNTER — Ambulatory Visit (INDEPENDENT_AMBULATORY_CARE_PROVIDER_SITE_OTHER): Payer: Medicare Other | Admitting: Internal Medicine

## 2022-01-16 ENCOUNTER — Encounter: Payer: Self-pay | Admitting: Internal Medicine

## 2022-01-16 VITALS — BP 100/60 | HR 75 | Temp 98.7°F | Ht 60.0 in | Wt 139.0 lb

## 2022-01-16 DIAGNOSIS — R634 Abnormal weight loss: Secondary | ICD-10-CM

## 2022-01-16 DIAGNOSIS — Z87891 Personal history of nicotine dependence: Secondary | ICD-10-CM

## 2022-01-16 DIAGNOSIS — Z0001 Encounter for general adult medical examination with abnormal findings: Secondary | ICD-10-CM | POA: Diagnosis not present

## 2022-01-16 DIAGNOSIS — E559 Vitamin D deficiency, unspecified: Secondary | ICD-10-CM

## 2022-01-16 DIAGNOSIS — I10 Essential (primary) hypertension: Secondary | ICD-10-CM

## 2022-01-16 DIAGNOSIS — R1900 Intra-abdominal and pelvic swelling, mass and lump, unspecified site: Secondary | ICD-10-CM

## 2022-01-16 DIAGNOSIS — I700 Atherosclerosis of aorta: Secondary | ICD-10-CM | POA: Diagnosis not present

## 2022-01-16 DIAGNOSIS — E538 Deficiency of other specified B group vitamins: Secondary | ICD-10-CM

## 2022-01-16 DIAGNOSIS — J309 Allergic rhinitis, unspecified: Secondary | ICD-10-CM | POA: Diagnosis not present

## 2022-01-16 DIAGNOSIS — E1165 Type 2 diabetes mellitus with hyperglycemia: Secondary | ICD-10-CM | POA: Diagnosis not present

## 2022-01-16 DIAGNOSIS — R1011 Right upper quadrant pain: Secondary | ICD-10-CM | POA: Diagnosis not present

## 2022-01-16 LAB — CBC WITH DIFFERENTIAL/PLATELET
Basophils Absolute: 0.1 10*3/uL (ref 0.0–0.1)
Basophils Relative: 0.8 % (ref 0.0–3.0)
Eosinophils Absolute: 0.1 10*3/uL (ref 0.0–0.7)
Eosinophils Relative: 1.5 % (ref 0.0–5.0)
HCT: 40.3 % (ref 36.0–46.0)
Hemoglobin: 13.4 g/dL (ref 12.0–15.0)
Lymphocytes Relative: 17.2 % (ref 12.0–46.0)
Lymphs Abs: 1.3 10*3/uL (ref 0.7–4.0)
MCHC: 33.1 g/dL (ref 30.0–36.0)
MCV: 84.5 fl (ref 78.0–100.0)
Monocytes Absolute: 0.6 10*3/uL (ref 0.1–1.0)
Monocytes Relative: 7.7 % (ref 3.0–12.0)
Neutro Abs: 5.4 10*3/uL (ref 1.4–7.7)
Neutrophils Relative %: 72.8 % (ref 43.0–77.0)
Platelets: 271 10*3/uL (ref 150.0–400.0)
RBC: 4.78 Mil/uL (ref 3.87–5.11)
RDW: 13.6 % (ref 11.5–15.5)
WBC: 7.4 10*3/uL (ref 4.0–10.5)

## 2022-01-16 LAB — URINALYSIS, ROUTINE W REFLEX MICROSCOPIC
Bilirubin Urine: NEGATIVE
Hgb urine dipstick: NEGATIVE
Ketones, ur: 15 — AB
Leukocytes,Ua: NEGATIVE
Nitrite: NEGATIVE
Specific Gravity, Urine: 1.01 (ref 1.000–1.030)
Total Protein, Urine: NEGATIVE
Urine Glucose: NEGATIVE
Urobilinogen, UA: 0.2 (ref 0.0–1.0)
pH: 7 (ref 5.0–8.0)

## 2022-01-16 LAB — MICROALBUMIN / CREATININE URINE RATIO
Creatinine,U: 111.2 mg/dL
Microalb Creat Ratio: 0.9 mg/g (ref 0.0–30.0)
Microalb, Ur: 1 mg/dL (ref 0.0–1.9)

## 2022-01-16 LAB — VITAMIN B12: Vitamin B-12: 721 pg/mL (ref 211–911)

## 2022-01-16 LAB — HEPATIC FUNCTION PANEL
ALT: 22 U/L (ref 0–35)
AST: 19 U/L (ref 0–37)
Albumin: 4.2 g/dL (ref 3.5–5.2)
Alkaline Phosphatase: 63 U/L (ref 39–117)
Bilirubin, Direct: 0.1 mg/dL (ref 0.0–0.3)
Total Bilirubin: 0.4 mg/dL (ref 0.2–1.2)
Total Protein: 7.4 g/dL (ref 6.0–8.3)

## 2022-01-16 LAB — BASIC METABOLIC PANEL
BUN: 18 mg/dL (ref 6–23)
CO2: 30 mEq/L (ref 19–32)
Calcium: 10.4 mg/dL (ref 8.4–10.5)
Chloride: 102 mEq/L (ref 96–112)
Creatinine, Ser: 0.86 mg/dL (ref 0.40–1.20)
GFR: 66.11 mL/min (ref >60.00)
Glucose, Bld: 112 mg/dL — ABNORMAL HIGH (ref 70–99)
Potassium: 3.3 mEq/L — ABNORMAL LOW (ref 3.5–5.1)
Sodium: 141 mEq/L (ref 135–145)

## 2022-01-16 LAB — LIPID PANEL
Cholesterol: 109 mg/dL (ref 0–200)
HDL: 36.2 mg/dL — ABNORMAL LOW (ref >39.00)
LDL Cholesterol: 47 mg/dL (ref 0–99)
NonHDL: 73.23
Total CHOL/HDL Ratio: 3
Triglycerides: 131 mg/dL (ref 0.0–149.0)
VLDL: 26.2 mg/dL (ref 0.0–40.0)

## 2022-01-16 LAB — TSH: TSH: 2.64 u[IU]/mL (ref 0.35–5.50)

## 2022-01-16 LAB — VITAMIN D 25 HYDROXY (VIT D DEFICIENCY, FRACTURES): VITD: 68.36 ng/mL (ref 30.00–100.00)

## 2022-01-16 LAB — LIPASE: Lipase: 23 U/L (ref 11.0–59.0)

## 2022-01-16 LAB — HEMOGLOBIN A1C: Hgb A1c MFr Bld: 6.5 % (ref 4.6–6.5)

## 2022-01-16 MED ORDER — FEXOFENADINE HCL 180 MG PO TABS: 180.0000 mg | ORAL_TABLET | Freq: Every day | ORAL | 3 refills | Status: DC | PRN

## 2022-01-16 NOTE — Progress Notes (Signed)
Chief Complaint:: wellness exam and allergies, wt loss, htn, dm       HPI:  Kristen Howe is a 76 y.o. female here for wellness exam; declines colonoscopy and shingrix; o/w up to date.                          Also Pt denies chest pain, increased sob or doe, wheezing, orthopnea, PND, increased LE swelling, palpitations, dizziness or syncope.   Pt denies polydipsia, polyuria, or new focal neuro s/s.   Pt denies fever, night sweats, loss of appetite, or other constitutional symptoms, but has had recent wt loss for unclear reason.    Does have several wks ongoing nasal allergy symptoms with clearish congestion, itch and sneezing, without fever, pain, ST, cough, swelling or wheezing.  Denies worsening reflux, abd pain, dysphagia, n/v, bowel change or blood.  Denies worsening depressive symptoms, suicidal ideation, or panic; has ongoing anxiety.         Wt Readings from Last 3 Encounters:  01/16/22 139 lb (63 kg)  08/08/21 154 lb (69.9 kg)  07/16/21 156 lb 3.2 oz (70.9 kg)   BP Readings from Last 3 Encounters:  01/16/22 100/60  08/08/21 120/70  07/16/21 110/83   Immunization History  Administered Date(s) Administered   Pneumococcal Conjugate-13 07/07/2019   Pneumococcal Polysaccharide-23 07/11/2021   Tdap 07/07/2019   There are no preventive care reminders to display for this patient.     Past Medical History:  Diagnosis Date   Allergy    Anemia    as a child   Anxiety    Arthritis    "lower back" (01/25/2016)   Bilateral breast cancer ([place])    Archie Endo 01/25/2016   Cataract    Depression    Esophageal diverticulum    GERD (gastroesophageal reflux disease)    Hyperlipidemia    Hypertension    Leg cramps    takes Flexeril   Migraine    "maybe monthly" (01/25/2016)   Osteoporosis    Pneumonia ~ 2005 X 1   Primary cancer of 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 breast ([place]) 12/14/2015   Rheumatic fever 1950s   Past Surgical History:  Procedure Laterality Date   BREAST  BIOPSY Bilateral 2016   CATARACT EXTRACTION W/ INTRAOCULAR LENS  IMPLANT, BILATERAL Bilateral 05/2015-06/2015   CESAREAN SECTION  1974; 1976   COLONOSCOPY     ESOPHAGEAL MANOMETRY N/A 03/10/2017   Procedure: ESOPHAGEAL MANOMETRY (EM);  Surgeon: Ladene Artist, MD;  [facility];  Service: Endoscopy;  Laterality: N/A;   EYE MUSCLE SURGERY Bilateral 1950s   for cross eyes as a child   INSERTION OF MESH N/A 09/12/2015   Procedure: INSERTION OF MESH;  Surgeon: Erroll Luna, MD;  Location: [place];  Service: General;  Laterality: N/A;   MASTECTOMY COMPLETE / SIMPLE W/ SENTINEL NODE BIOPSY Right 01/25/2016   MASTECTOMY MODIFIED RADICAL Left 01/25/2016   MASTECTOMY W/ SENTINEL NODE BIOPSY Bilateral 01/25/2016   Procedure: RIGHT MASTECTOMY WITH SENTINEL RIGHT LYMPH NODE BIOPSY, LEFT MODIFIED RADICAL MASTECTOMY;  Surgeon: Stark Klein, MD;  Location: [place];  Service: General;  Laterality: Bilateral;   TONSILLECTOMY  [id number]   UMBILICAL HERNIA REPAIR N/A 09/12/2015   Procedure: REPAIR OF INCISIONAL AND UMBILICAL HERNIA;  Surgeon: Erroll Luna, MD;  Location: [place];  Service: General;  Laterality: N/A;    reports that she quit smoking about 6 years ago. Her smoking use included  cigarettes. She smoked an average of 1.00 packs per day. She has never used smokeless tobacco. She reports that she does not drink alcohol and does not use drugs. family history includes Breast cancer in her cousin, cousin, cousin, cousin, and sister; Cancer in her cousin; Cancer (age of onset: 38) in her maternal grandmother; Diabetes in her father, paternal aunt, and paternal uncle; Gout in her paternal grandfather and paternal grandmother; Healthy in her mother; Heart Problems in her father; Lung cancer in her cousin and maternal uncle; Lupus in her cousin; Lymphoma in her father; Other in her father and mother; Stroke in her maternal grandfather. Allergies  Allergen Reactions   Multihance [Gadobenate] Itching, Rash and Other  (See Comments)    Pt developed immediate itching, red eyes and rash after MR contrast. Subsided with 25 mg of benadryl. Pt will need 13 hr prep prior to future studies. 02/27/21   Current Outpatient Medications on File Prior to Visit  Medication Sig Dispense Refill   albuterol (VENTOLIN HFA) 108 (90 Base) MCG/ACT inhaler Inhale 2 puffs into the lungs every 6 (six) hours as needed for wheezing or shortness of breath. 18 g 11   Artificial Tear Ointment (DRY EYES OP) Apply 1 drop to eye daily as needed (for dry eyes).      atorvastatin (LIPITOR) 10 MG tablet Take 1 tablet by mouth once daily 90 tablet 3   Calcium-Phosphorus-Vitamin D (CITRACAL +D3 PO) Take 2 tablets by mouth 2 (two) times daily.      fluticasone-salmeterol (ADVAIR) 250-50 MCG/ACT AEPB INHALE 1 DOSE BY MOUTH IN THE MORNING AND AT BEDTIME 60 each 1   glucose blood (ACCU-CHEK GUIDE) test strip 1 each by Other route daily. use as directed 100 each 1   ibuprofen (ADVIL) 800 MG tablet Take 800 mg by mouth every 6 (six) hours as needed.     losartan (COZAAR) 50 MG tablet Take 1 tablet (50 mg total) by mouth daily. 90 tablet 0   Multiple Vitamins-Minerals (MULTIVITAMIN WITH MINERALS) tablet Take 1 tablet by mouth daily. Reported on 12/20/2015     pantoprazole (PROTONIX) 40 MG tablet TAKE 1 TABLET BY MOUTH TWICE DAILY                BEFORE MEAL(S) 60 tablet 3   rizatriptan (MAXALT-MLT) 10 MG disintegrating tablet Take 1 tablet (10 mg total) by mouth as needed for migraine. May repeat in 2 hours if needed 10 tablet 11   venlafaxine XR (EFFEXOR-XR) 75 MG 24 hr capsule TAKE 1 CAPSULE BY MOUTH ONCE DAILY WITH BREAKFAST 90 capsule 0   No current facility-administered medications on file prior to visit.        ROS:  All others reviewed and negative.  Objective        PE:  BP 100/60 (BP Location: Left Arm, Patient Position: Sitting, Cuff Size: Normal)    Pulse 75    Temp 98.7 F (37.1 C) (Oral)    Ht 5' (1.524 m)    Wt 139 lb (63 kg)    SpO2 96%     BMI 27.15 kg/m                 Constitutional: Pt appears in NAD               HENT: Head: NCAT.                Right Ear: External ear normal.  Left Ear: External ear normal.                Eyes: . Pupils are equal, round, and reactive to light. Conjunctivae and EOM are normal               Nose: without d/c or deformity               Neck: Neck supple. Gross normal ROM               Cardiovascular: Normal rate and regular rhythm.                 Pulmonary/Chest: Effort normal and breath sounds without rales or wheezing.                Abd:  Soft, NT, ND, + BS, no organomegaly               Neurological: Pt is alert. At baseline orientation, motor grossly intact               Skin: Skin is warm. No rashes, no other new lesions, LE edema - none               Psychiatric: Pt behavior is normal without agitation   Micro: none  Cardiac tracings I have personally interpreted today:  none  Pertinent Radiological findings (summarize): none   Lab Results  Component Value Date   WBC 7.4 01/16/2022   HGB 13.4 01/16/2022   HCT 40.3 01/16/2022   PLT 271.0 01/16/2022   GLUCOSE 112 (H) 01/16/2022   CHOL 109 01/16/2022   TRIG 131.0 01/16/2022   HDL 36.20 (L) 01/16/2022   LDLDIRECT 137.0 06/17/2018   LDLCALC 47 01/16/2022   ALT 22 01/16/2022   AST 19 01/16/2022   NA 141 01/16/2022   K 3.3 (L) 01/16/2022   CL 102 01/16/2022   CREATININE 0.86 01/16/2022   BUN 18 01/16/2022   CO2 30 01/16/2022   TSH 2.64 01/16/2022   HGBA1C 6.5 01/16/2022   MICROALBUR 1.0 01/16/2022   Assessment/Plan:  Kristen Howe is a 76 y.o. White or Caucasian [1] female with  has a past medical history of Allergy, Anemia, Anxiety, Arthritis, Bilateral breast cancer ([place]), Cataract, Depression, Esophageal diverticulum, GERD (gastroesophageal reflux disease), Hyperlipidemia, Hypertension, Leg cramps, Migraine, Osteoporosis, Pneumonia (~ 2005 X 1), Primary cancer of upper inner quadrant of left  female breast ([place]) (12/14/2015), and Rheumatic fever (1950s).  Encounter for well adult exam with abnormal findings Age and sex appropriate education and counseling updated with regular exercise and diet Referrals for preventative services - declines colonoscopy Immunizations addressed - declines shingrix Smoking counseling  - none needed  - quit x 6 yrs Evidence for depression or other mood disorder - none significant Most recent labs reviewed. I have personally reviewed and have noted: 1) the patient's medical and social history 2) The patient's current medications and supplements 3) The patient's height, weight, and BMI have been recorded in the chart   Aortic atherosclerosis ([place]) Pt to continue lipitor, low chol diet, excercise  Diabetes mellitus ([place]) Lab Results  Component Value Date   HGBA1C 6.5 01/16/2022   Stable, pt to continue current medical treatment  - diet   Essential hypertension BP Readings from Last 3 Encounters:  01/16/22 100/60  08/08/21 120/70  07/16/21 110/83   Low normal, likely overcontrolled with recent wt loss,, pt to continue medical treatment losartan but d/c hct, cont to r/u bp at home and next  visit   Allergic rhinitis Mild worsening, for allegra prn,  to f/u any worsening symptoms or concerns  Weight loss Etiology unclear, ? Geriatric decline vs other - for labs as orderd, continue to monitor  Former smoker Pt quit, encouraged abstinence  Followup: Return in about 6 months (around 07/16/2022).  Cathlean Cower, MD 01/20/2022 4:44 PM [facility]

## 2022-01-16 NOTE — Patient Instructions (Signed)
Please take all new medication as prescribed - the allegra for allergies  Ok to STOP the HCT (hydrochlorothiazide) fluid pill  Please continue all other medications as before, and refills have been done if requested.  Please have the pharmacy call with any other refills you may need.  Please continue your efforts at being more active, low cholesterol diet, and weight control.  You are otherwise up to date with prevention measures today.  Please keep your appointments with your specialists as you may have planned  Please go to the LAB at the blood drawing area for the tests to be done  You will be contacted by phone if any changes need to be made immediately.  Otherwise, you will receive a letter about your results with an explanation, but please check with MyChart first.  Please remember to sign up for MyChart if you have not done so, as this will be important to you in the future with finding out test results, communicating by private email, and scheduling acute appointments online when needed.  Please make an Appointment to return in 6 months, or sooner if needed

## 2022-01-20 ENCOUNTER — Encounter: Payer: Self-pay | Admitting: Internal Medicine

## 2022-01-20 NOTE — Assessment & Plan Note (Signed)
Lab Results  Component Value Date   HGBA1C 6.5 01/16/2022   Stable, pt to continue current medical treatment  - diet

## 2022-01-20 NOTE — Assessment & Plan Note (Signed)
Etiology unclear, ? Geriatric decline vs other - for labs as orderd, continue to monitor

## 2022-01-20 NOTE — Assessment & Plan Note (Signed)
Pt to continue lipitor, low chol diet, excercise 

## 2022-01-20 NOTE — Assessment & Plan Note (Signed)
BP Readings from Last 3 Encounters:  01/16/22 100/60  08/08/21 120/70  07/16/21 110/83   Low normal, likely overcontrolled with recent wt loss,, pt to continue medical treatment losartan but d/c hct, cont to r/u bp at home and next visit

## 2022-01-20 NOTE — Assessment & Plan Note (Signed)
Pt quit, encouraged abstinence

## 2022-01-20 NOTE — Assessment & Plan Note (Signed)
Age and sex appropriate education and counseling updated with regular exercise and diet Referrals for preventative services - declines colonoscopy Immunizations addressed - declines shingrix Smoking counseling  - none needed  - quit x 6 yrs Evidence for depression or other mood disorder - none significant Most recent labs reviewed. I have personally reviewed and have noted: 1) the patient's medical and social history 2) The patient's current medications and supplements 3) The patient's height, weight, and BMI have been recorded in the chart

## 2022-01-20 NOTE — Assessment & Plan Note (Signed)
Mild worsening, for allegra prn,  to f/u any worsening symptoms or concerns

## 2022-03-08 ENCOUNTER — Other Ambulatory Visit: Payer: Self-pay | Admitting: Internal Medicine

## 2022-03-08 DIAGNOSIS — I10 Essential (primary) hypertension: Secondary | ICD-10-CM

## 2022-03-08 NOTE — Telephone Encounter (Signed)
Please refill as per office routine med refill policy (all routine meds to be refilled for 3 mo or monthly (per pt preference) up to one year from last visit, then month to month grace period for 3 mo, then further med refills will have to be denied) ? ?

## 2022-03-14 DIAGNOSIS — H5203 Hypermetropia, bilateral: Secondary | ICD-10-CM | POA: Diagnosis not present

## 2022-03-14 DIAGNOSIS — E119 Type 2 diabetes mellitus without complications: Secondary | ICD-10-CM | POA: Diagnosis not present

## 2022-03-14 DIAGNOSIS — H52223 Regular astigmatism, bilateral: Secondary | ICD-10-CM | POA: Diagnosis not present

## 2022-03-14 DIAGNOSIS — H40023 Open angle with borderline findings, high risk, bilateral: Secondary | ICD-10-CM | POA: Diagnosis not present

## 2022-05-17 ENCOUNTER — Other Ambulatory Visit: Payer: Self-pay | Admitting: Internal Medicine

## 2022-05-17 DIAGNOSIS — M62838 Other muscle spasm: Secondary | ICD-10-CM

## 2022-05-24 ENCOUNTER — Other Ambulatory Visit: Payer: Self-pay | Admitting: Internal Medicine

## 2022-05-24 DIAGNOSIS — M62838 Other muscle spasm: Secondary | ICD-10-CM

## 2022-05-28 ENCOUNTER — Ambulatory Visit (INDEPENDENT_AMBULATORY_CARE_PROVIDER_SITE_OTHER): Payer: Medicare Other | Admitting: Internal Medicine

## 2022-05-28 ENCOUNTER — Ambulatory Visit (INDEPENDENT_AMBULATORY_CARE_PROVIDER_SITE_OTHER): Payer: Medicare Other

## 2022-05-28 ENCOUNTER — Encounter: Payer: Self-pay | Admitting: Internal Medicine

## 2022-05-28 VITALS — BP 142/82 | HR 74 | Temp 98.5°F | Ht 60.0 in | Wt 146.4 lb

## 2022-05-28 DIAGNOSIS — R059 Cough, unspecified: Secondary | ICD-10-CM | POA: Diagnosis not present

## 2022-05-28 DIAGNOSIS — E785 Hyperlipidemia, unspecified: Secondary | ICD-10-CM

## 2022-05-28 DIAGNOSIS — R002 Palpitations: Secondary | ICD-10-CM

## 2022-05-28 DIAGNOSIS — F321 Major depressive disorder, single episode, moderate: Secondary | ICD-10-CM

## 2022-05-28 DIAGNOSIS — F419 Anxiety disorder, unspecified: Secondary | ICD-10-CM | POA: Diagnosis not present

## 2022-05-28 DIAGNOSIS — E1165 Type 2 diabetes mellitus with hyperglycemia: Secondary | ICD-10-CM

## 2022-05-28 DIAGNOSIS — R252 Cramp and spasm: Secondary | ICD-10-CM

## 2022-05-28 DIAGNOSIS — R0600 Dyspnea, unspecified: Secondary | ICD-10-CM

## 2022-05-28 DIAGNOSIS — R0602 Shortness of breath: Secondary | ICD-10-CM | POA: Diagnosis not present

## 2022-05-28 DIAGNOSIS — I10 Essential (primary) hypertension: Secondary | ICD-10-CM

## 2022-05-28 LAB — BASIC METABOLIC PANEL
BUN: 17 mg/dL (ref 6–23)
CO2: 26 mEq/L (ref 19–32)
Calcium: 10 mg/dL (ref 8.4–10.5)
Chloride: 104 mEq/L (ref 96–112)
Creatinine, Ser: 0.82 mg/dL (ref 0.40–1.20)
GFR: 69.82 mL/min (ref >60.00)
Glucose, Bld: 95 mg/dL (ref 70–99)
Potassium: 4 mEq/L (ref 3.5–5.1)
Sodium: 138 mEq/L (ref 135–145)

## 2022-05-28 LAB — CBC WITH DIFFERENTIAL/PLATELET
Basophils Absolute: 0.1 10*3/uL (ref 0.0–0.1)
Basophils Relative: 0.8 % (ref 0.0–3.0)
Eosinophils Absolute: 0.1 10*3/uL (ref 0.0–0.7)
Eosinophils Relative: 1.7 % (ref 0.0–5.0)
HCT: 40.5 % (ref 36.0–46.0)
Hemoglobin: 13.4 g/dL (ref 12.0–15.0)
Lymphocytes Relative: 20.5 % (ref 12.0–46.0)
Lymphs Abs: 1.4 10*3/uL (ref 0.7–4.0)
MCHC: 33.1 g/dL (ref 30.0–36.0)
MCV: 86.4 fl (ref 78.0–100.0)
Monocytes Absolute: 0.5 10*3/uL (ref 0.1–1.0)
Monocytes Relative: 7.9 % (ref 3.0–12.0)
Neutro Abs: 4.8 10*3/uL (ref 1.4–7.7)
Neutrophils Relative %: 69.1 % (ref 43.0–77.0)
Platelets: 257 10*3/uL (ref 150.0–400.0)
RBC: 4.69 Mil/uL (ref 3.87–5.11)
RDW: 14.2 % (ref 11.5–15.5)
WBC: 6.9 10*3/uL (ref 4.0–10.5)

## 2022-05-28 LAB — HEPATIC FUNCTION PANEL
ALT: 28 U/L (ref 0–35)
AST: 22 U/L (ref 0–37)
Albumin: 4.2 g/dL (ref 3.5–5.2)
Alkaline Phosphatase: 78 U/L (ref 39–117)
Bilirubin, Direct: 0.1 mg/dL (ref 0.0–0.3)
Total Bilirubin: 0.4 mg/dL (ref 0.2–1.2)
Total Protein: 7.3 g/dL (ref 6.0–8.3)

## 2022-05-28 LAB — LIPID PANEL
Cholesterol: 175 mg/dL (ref 0–200)
HDL: 45.5 mg/dL (ref >39.00)
LDL Cholesterol: 108 mg/dL — ABNORMAL HIGH (ref 0–99)
NonHDL: 129.76
Total CHOL/HDL Ratio: 4
Triglycerides: 109 mg/dL (ref 0.0–149.0)
VLDL: 21.8 mg/dL (ref 0.0–40.0)

## 2022-05-28 LAB — HEMOGLOBIN A1C: Hgb A1c MFr Bld: 6.3 % (ref 4.6–6.5)

## 2022-05-28 LAB — TSH: TSH: 3.07 u[IU]/mL (ref 0.35–5.50)

## 2022-05-28 MED ORDER — PANTOPRAZOLE SODIUM 40 MG PO TBEC: DELAYED_RELEASE_TABLET | ORAL | 3 refills | Status: DC

## 2022-05-28 MED ORDER — FLUTICASONE-SALMETEROL 250-50 MCG/ACT IN AEPB
INHALATION_SPRAY | RESPIRATORY_TRACT | 3 refills | Status: DC
Start: 2022-05-28 — End: 2024-04-15

## 2022-05-28 MED ORDER — ATORVASTATIN CALCIUM 10 MG PO TABS: 10.0000 mg | ORAL_TABLET | Freq: Every day | ORAL | 3 refills | Status: DC

## 2022-05-28 MED ORDER — CYCLOBENZAPRINE HCL 5 MG PO TABS: 5.0000 mg | ORAL_TABLET | Freq: Three times a day (TID) | ORAL | 2 refills | Status: DC | PRN

## 2022-05-28 MED ORDER — ALBUTEROL SULFATE HFA 108 (90 BASE) MCG/ACT IN AERS: 2.0000 | INHALATION_SPRAY | Freq: Four times a day (QID) | RESPIRATORY_TRACT | 11 refills | Status: DC | PRN

## 2022-05-28 MED ORDER — VENLAFAXINE HCL ER 75 MG PO CP24: 75.0000 mg | ORAL_CAPSULE | Freq: Every day | ORAL | 3 refills | Status: DC

## 2022-05-28 MED ORDER — LOSARTAN POTASSIUM 50 MG PO TABS: 50.0000 mg | ORAL_TABLET | Freq: Every day | ORAL | 3 refills | Status: DC

## 2022-05-28 NOTE — Progress Notes (Signed)
Patient ID: Kristen Howe, female   DOB: 12/22/1945, 76 y.o.   MRN: [id number]        Chief Complaint: follow up 1 wk palpitations with doe       HPI:  Kristen Howe is a 76 y.o. female here with c/o 1 wk onset mild but unusual sob and palpitations for unclear reason, denies fever, admits to not drinking fluids as much as probably should, no recent excessive heat exposure, and Pt denies chest pain, wheezing, orthopnea, PND, increased LE swelling, palpitations, dizziness or syncope.  Quit tobacco x 6 mo, prior to that 1 ppd x 20 yrs, no prior hx of copd diagnosis, or specific arrythmia.  Also has increased night time leg cramping.   Feb 2023 TSH normal.  Pt denies polydipsia, polyuria, or new focal neuro s/s.    Pt denies fever, wt loss, night sweats, loss of appetite, or other constitutional symptoms  Denies worsening depressive symptoms, suicidal ideation, or panic; has ongoing anxiety   Wt Readings from Last 3 Encounters:  05/28/22 146 lb 6.4 oz (66.4 kg)  01/16/22 139 lb (63 kg)  08/08/21 154 lb (69.9 kg)   BP Readings from Last 3 Encounters:  05/28/22 (!) 142/82  01/16/22 100/60  08/08/21 120/70         Past Medical History:  Diagnosis Date   Allergy    Anemia    as a child   Anxiety    Arthritis    "lower back" (01/25/2016)   Bilateral breast cancer ([place])    [facility] 01/25/2016   Cataract    Depression    Esophageal diverticulum    GERD (gastroesophageal reflux disease)    Hyperlipidemia    Hypertension    Leg cramps    takes Flexeril   Migraine    "maybe monthly" (01/25/2016)   Osteoporosis    Pneumonia ~ 2005 X 1   Primary cancer of 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 breast ([place]) 12/14/2015   Rheumatic fever 1950s   Past Surgical History:  Procedure Laterality Date   BREAST BIOPSY Bilateral 2016   CATARACT EXTRACTION W/ INTRAOCULAR LENS  IMPLANT, BILATERAL Bilateral 05/2015-06/2015   CESAREAN SECTION  1974; 1976   COLONOSCOPY     ESOPHAGEAL MANOMETRY N/A 03/10/2017    Procedure: ESOPHAGEAL MANOMETRY (EM);  Surgeon: Ladene Artist, MD;  Location: [facility];  Service: Endoscopy;  Laterality: N/A;   EYE MUSCLE SURGERY Bilateral 1950s   for cross eyes as a child   INSERTION OF MESH N/A 09/12/2015   Procedure: INSERTION OF MESH;  Surgeon: Erroll Luna, MD;  Location: [place];  Service: General;  Laterality: N/A;   MASTECTOMY COMPLETE / SIMPLE W/ SENTINEL NODE BIOPSY Right 01/25/2016   MASTECTOMY MODIFIED RADICAL Left 01/25/2016   MASTECTOMY W/ SENTINEL NODE BIOPSY Bilateral 01/25/2016   Procedure: RIGHT MASTECTOMY WITH SENTINEL RIGHT LYMPH NODE BIOPSY, LEFT MODIFIED RADICAL MASTECTOMY;  Surgeon: Stark Klein, MD;  Location: [place];  Service: General;  Laterality: Bilateral;   TONSILLECTOMY  [id number]   UMBILICAL HERNIA REPAIR N/A 09/12/2015   Procedure: REPAIR OF INCISIONAL AND UMBILICAL HERNIA;  Surgeon: Erroll Luna, MD;  Location: [place];  Service: General;  Laterality: N/A;    reports that she quit smoking about 6 years ago. Her smoking use included cigarettes. She smoked an average of 1.00 packs per day. She has never used smokeless tobacco. She reports that she does not drink alcohol and does not use drugs. family history includes Breast cancer in her cousin,  cousin, cousin, cousin, and sister; Cancer in her cousin; Cancer (age of onset: 12) in her maternal grandmother; Diabetes in her father, paternal aunt, and paternal uncle; Gout in her paternal grandfather and paternal grandmother; Healthy in her mother; Heart Problems in her father; Lung cancer in her cousin and maternal uncle; Lupus in her cousin; Lymphoma in her father; Other in her father and mother; Stroke in her maternal grandfather. Allergies  Allergen Reactions   Multihance [Gadobenate] Itching, Rash and Other (See Comments)    Pt developed immediate itching, red eyes and rash after MR contrast. Subsided with 25 mg of benadryl. Pt will need 13 hr prep prior to future studies. 02/27/21   Current Outpatient  Medications on File Prior to Visit  Medication Sig Dispense Refill   Artificial Tear Ointment (DRY EYES OP) Apply 1 drop to eye daily as needed (for dry eyes).      Calcium-Phosphorus-Vitamin D (CITRACAL +D3 PO) Take 2 tablets by mouth 2 (two) times daily.      glucose blood (ACCU-CHEK GUIDE) test strip 1 each by Other route daily. use as directed 100 each 1   ibuprofen (ADVIL) 800 MG tablet Take 800 mg by mouth every 6 (six) hours as needed.     Multiple Vitamins-Minerals (MULTIVITAMIN WITH MINERALS) tablet Take 1 tablet by mouth daily. Reported on 12/20/2015     No current facility-administered medications on file prior to visit.        ROS:  All others reviewed and negative.  Objective        PE:  BP (!) 142/82 (BP Location: Right Arm, Patient Position: Sitting, Cuff Size: Large)   Pulse 74   Temp 98.5 F (36.9 C) (Oral)   Ht 5' (1.524 m)   Wt 146 lb 6.4 oz (66.4 kg)   SpO2 95%   BMI 28.59 kg/m   Ambulatory o2 sat:  90% at 100 ft                Constitutional: Pt appears in NAD               HENT: Head: NCAT.                Right Ear: External ear normal.                 Left Ear: External ear normal.                Eyes: . Pupils are equal, round, and reactive to light. Conjunctivae and EOM are normal               Nose: without d/c or deformity               Neck: Neck supple. Gross normal ROM               Cardiovascular: Normal rate and regular rhythm.                 Pulmonary/Chest: Effort normal and breath sounds decreased without rales or wheezing.                Abd:  Soft, NT, ND, + BS, no organomegaly               Neurological: Pt is alert. At baseline orientation, motor grossly intact               Skin: Skin is warm. No rashes, no other new lesions, LE edema - none  Psychiatric: Pt behavior is normal without agitation , mod nervous  Micro: none  Cardiac tracings I have personally interpreted today:  ECG - NSR 63  Pertinent Radiological findings  (summarize): none   Lab Results  Component Value Date   WBC 6.9 05/28/2022   HGB 13.4 05/28/2022   HCT 40.5 05/28/2022   PLT 257.0 05/28/2022   GLUCOSE 95 05/28/2022   CHOL 175 05/28/2022   TRIG 109.0 05/28/2022   HDL 45.50 05/28/2022   LDLDIRECT 137.0 06/17/2018   LDLCALC 108 (H) 05/28/2022   ALT 28 05/28/2022   AST 22 05/28/2022   NA 138 05/28/2022   K 4.0 05/28/2022   CL 104 05/28/2022   CREATININE 0.82 05/28/2022   BUN 17 05/28/2022   CO2 26 05/28/2022   TSH 3.07 05/28/2022   HGBA1C 6.3 05/28/2022   MICROALBUR 1.0 01/16/2022   Assessment/Plan:  Kayleann Mccaffery is a 76 y.o. White or Caucasian [1] female with  has a past medical history of Allergy, Anemia, Anxiety, Arthritis, Bilateral breast cancer ([place]), Cataract, Depression, Esophageal diverticulum, GERD (gastroesophageal reflux disease), Hyperlipidemia, Hypertension, Leg cramps, Migraine, Osteoporosis, Pneumonia (~ 2005 X 1), Primary cancer of 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 breast ([place]) (12/14/2015), and Rheumatic fever (1950s).  Essential hypertension BP Readings from Last 3 Encounters:  05/28/22 (!) 142/82  01/16/22 100/60  08/08/21 120/70   Mild uncontrolled, possibly reactive, pt to continue medical treatment losartan 50 mg qd   Diabetes mellitus ([place]) Lab Results  Component Value Date   HGBA1C 6.3 05/28/2022   Stable, pt to continue current medical treatment  - diet, wt control   Anxiety Mild increased, suspect possible situational, cont effexor xr 75  Palpitations Only with exertion, etiology unclear, ECG reviewed, declines card event monitor for now, recent [place] normal  Dyspnea Recent onset it seems for unclear reasons, no evidence for volume overload or infection by exam, amb o2 sat normal, for cbc with labs, but suspect underlying copd- for PFTs, CXR, and albuterol hfa prn, adviar asd  Leg cramps Also for flexeril qhs prn  Followup: No follow-ups on file.  Cathlean Cower, MD 05/31/2022 8:27  PM [facility]

## 2022-05-28 NOTE — Patient Instructions (Signed)
Your oxygen was checked with walking today  Your EKG was done today  Please take all new medication as prescribed - the muscle relaxer as needed  Please continue all other medications as before, and refills have been done if requested- to [facility] (all of them)  Please have the pharmacy call with any other refills you may need.  Please continue your efforts at being more active, low cholesterol diet, and weight control.  Please keep your appointments with your specialists as you may have planned  You will be contacted regarding the referral for: PFTs (pulmonary function testing)  Please go to the XRAY Department in the first floor for the x-ray testing  Please go to the LAB at the blood drawing area for the tests to be done  You will be contacted by phone if any changes need to be made immediately.  Otherwise, you will receive a letter about your results with an explanation, but please check with MyChart first.  Please remember to sign up for MyChart if you have not done so, as this will be important to you in the future with finding out test results, communicating by private email, and scheduling acute appointments online when needed.  Ok to change the August appt to 4 months or sooner if needed

## 2022-05-29 LAB — URINALYSIS, ROUTINE W REFLEX MICROSCOPIC
Bilirubin Urine: NEGATIVE
Hgb urine dipstick: NEGATIVE
Leukocytes,Ua: NEGATIVE
Nitrite: NEGATIVE
Specific Gravity, Urine: 1.02 (ref 1.000–1.030)
Total Protein, Urine: NEGATIVE
Urine Glucose: NEGATIVE
Urobilinogen, UA: 0.2 (ref 0.0–1.0)
pH: 6 (ref 5.0–8.0)

## 2022-05-31 DIAGNOSIS — R002 Palpitations: Secondary | ICD-10-CM | POA: Insufficient documentation

## 2022-05-31 DIAGNOSIS — R252 Cramp and spasm: Secondary | ICD-10-CM | POA: Insufficient documentation

## 2022-05-31 DIAGNOSIS — R0600 Dyspnea, unspecified: Secondary | ICD-10-CM | POA: Insufficient documentation

## 2022-05-31 NOTE — Assessment & Plan Note (Signed)
Mild increased, suspect possible situational, cont effexor xr 75

## 2022-05-31 NOTE — Assessment & Plan Note (Signed)
Recent onset it seems for unclear reasons, no evidence for volume overload or infection by exam, amb o2 sat normal, for cbc with labs, but suspect underlying copd- for PFTs, CXR, and albuterol hfa prn, adviar asd

## 2022-05-31 NOTE — Assessment & Plan Note (Signed)
Only with exertion, etiology unclear, ECG reviewed, declines card event monitor for now, recent [facility] normal

## 2022-05-31 NOTE — Assessment & Plan Note (Signed)
BP Readings from Last 3 Encounters:  05/28/22 (!) 142/82  01/16/22 100/60  08/08/21 120/70   Mild uncontrolled, possibly reactive, pt to continue medical treatment losartan 50 mg qd

## 2022-05-31 NOTE — Assessment & Plan Note (Signed)
Also for flexeril qhs prn

## 2022-05-31 NOTE — Assessment & Plan Note (Signed)
Lab Results  Component Value Date   HGBA1C 6.3 05/28/2022   Stable, pt to continue current medical treatment  - diet, wt control

## 2022-07-02 ENCOUNTER — Telehealth: Payer: Self-pay | Admitting: Internal Medicine

## 2022-07-02 NOTE — Telephone Encounter (Signed)
LVM for pt to rtn my call to schedule AWV with [facility] call back # [phone] 

## 2022-07-05 ENCOUNTER — Ambulatory Visit (INDEPENDENT_AMBULATORY_CARE_PROVIDER_SITE_OTHER): Payer: Medicare Other

## 2022-07-05 DIAGNOSIS — Z0000 Encounter for general adult medical examination without abnormal findings: Secondary | ICD-10-CM

## 2022-07-05 NOTE — Progress Notes (Signed)
I connected with Kristen Howe today by telephone and verified that I am speaking with the correct person using two identifiers. Location patient: home Location provider: work Persons participating in the virtual visit: patient, provider.   I discussed the limitations, risks, security and privacy concerns of performing an evaluation and management service by telephone and the availability of in person appointments. I also discussed with the patient that there may be a patient responsible charge related to this service. The patient expressed understanding and verbally consented to this telephonic visit.    Interactive audio and video telecommunications were attempted between this provider and patient, however failed, due to patient having technical difficulties OR patient did not have access to video capability.  We continued and completed visit with audio only.  Some vital signs may be absent or patient reported.   Time Spent with patient on telephone encounter: 30 minutes  Subjective:   Kristen Howe is a 76 y.o. female who presents for Medicare Annual (Subsequent) preventive examination.  Review of Systems     Cardiac Risk Factors include: advanced age (>95mn, >>80women);diabetes mellitus;dyslipidemia;family history of premature cardiovascular disease;hypertension     Objective:    There were no vitals filed for this visit. There is no height or weight on file to calculate BMI.     07/05/2022   11:31 AM 07/11/2021   11:28 AM 05/24/2021    1:20 PM 08/19/2019    8:28 AM 11/13/2017   10:21 AM 02/26/2017    7:33 AM 02/13/2017    8:34 AM  Advanced Directives  Does Patient Have a Medical Advance Directive? No No No No No No No  Would patient like information on creating a medical advance directive? No - Patient declined Yes (MAU/Ambulatory/Procedural Areas - Information given)  Yes (MAU/Ambulatory/Procedural Areas - Information given) Yes (ED - Information included in AVS)       Current Medications (verified) Outpatient Encounter Medications as of 07/05/2022  Medication Sig   albuterol (VENTOLIN HFA) 108 (90 Base) MCG/ACT inhaler Inhale 2 puffs into the lungs every 6 (six) hours as needed for wheezing or shortness of breath.   Artificial Tear Ointment (DRY EYES OP) Apply 1 drop to eye daily as needed (for dry eyes).    atorvastatin (LIPITOR) 10 MG tablet Take 1 tablet (10 mg total) by mouth daily.   Calcium-Phosphorus-Vitamin D (CITRACAL +D3 PO) Take 2 tablets by mouth 2 (two) times daily.    cyclobenzaprine (FLEXERIL) 5 MG tablet Take 1 tablet (5 mg total) by mouth 3 (three) times daily as needed.   fluticasone-salmeterol (ADVAIR) 250-50 MCG/ACT AEPB INHALE 1 DOSE BY MOUTH IN THE MORNING AND AT BEDTIME   glucose blood (ACCU-CHEK GUIDE) test strip 1 each by Other route daily. use as directed   ibuprofen (ADVIL) 800 MG tablet Take 800 mg by mouth every 6 (six) hours as needed.   losartan (COZAAR) 50 MG tablet Take 1 tablet (50 mg total) by mouth daily.   Multiple Vitamins-Minerals (MULTIVITAMIN WITH MINERALS) tablet Take 1 tablet by mouth daily. Reported on 12/20/2015   pantoprazole (PROTONIX) 40 MG tablet 1 tab by mouth twice per day   venlafaxine XR (EFFEXOR-XR) 75 MG 24 hr capsule Take 1 capsule (75 mg total) by mouth daily with breakfast.   No facility-administered encounter medications on file as of 07/05/2022.    Allergies (verified) Multihance [gadobenate]   History: Past Medical History:  Diagnosis Date   Allergy    Anemia    as a child  Anxiety    Arthritis    "lower back" (01/25/2016)   Bilateral breast cancer ([place])    [facility] 01/25/2016   Cataract    Depression    Esophageal diverticulum    GERD (gastroesophageal reflux disease)    Hyperlipidemia    Hypertension    Leg cramps    takes Flexeril   Migraine    "maybe monthly" (01/25/2016)   Osteoporosis    Pneumonia ~ 2005 X 1   Primary cancer of 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 breast  ([place]) 12/14/2015   Rheumatic fever 1950s   Past Surgical History:  Procedure Laterality Date   BREAST BIOPSY Bilateral 2016   CATARACT EXTRACTION W/ INTRAOCULAR LENS  IMPLANT, BILATERAL Bilateral 05/2015-06/2015   CESAREAN SECTION  1974; 1976   COLONOSCOPY     ESOPHAGEAL MANOMETRY N/A 03/10/2017   Procedure: ESOPHAGEAL MANOMETRY (EM);  Surgeon: Ladene Artist, MD;  Location: [facility];  Service: Endoscopy;  Laterality: N/A;   EYE MUSCLE SURGERY Bilateral 1950s   for cross eyes as a child   INSERTION OF MESH N/A 09/12/2015   Procedure: INSERTION OF MESH;  Surgeon: Erroll Luna, MD;  Location: [facility];  Service: General;  Laterality: N/A;   MASTECTOMY COMPLETE / SIMPLE W/ SENTINEL NODE BIOPSY Right 01/25/2016   MASTECTOMY MODIFIED RADICAL Left 01/25/2016   MASTECTOMY W/ SENTINEL NODE BIOPSY Bilateral 01/25/2016   Procedure: RIGHT MASTECTOMY WITH SENTINEL RIGHT LYMPH NODE BIOPSY, LEFT MODIFIED RADICAL MASTECTOMY;  Surgeon: Stark Klein, MD;  Location: [facility];  Service: General;  Laterality: Bilateral;   TONSILLECTOMY  1610R   UMBILICAL HERNIA REPAIR N/A 09/12/2015   Procedure: REPAIR OF INCISIONAL AND UMBILICAL HERNIA;  Surgeon: Erroll Luna, MD;  Location: [facility];  Service: General;  Laterality: N/A;   Family History  Problem Relation Age of Onset   Healthy Mother    Other Mother        +"throat polyps"; +smoker   Lymphoma Father        d. 21   Diabetes Father    Heart Problems Father    Other Father        "tumors on his kidneys"   Breast cancer Sister        dx. metachronous bilateral breast cancers at 17 and 27; - BRCA1/2 testing   Cancer Maternal Grandmother 63       dx. spinal cancer   Stroke Maternal Grandfather    Gout Paternal Grandmother    Gout Paternal Grandfather    Lung cancer Maternal Uncle        d. 75-72; +smoker   Diabetes Paternal Aunt    Diabetes Paternal Uncle    Breast cancer Cousin        maternal 1st cousin dx. unspecified age   Lung cancer Cousin         maternal 1st cousin dx. lung cancer; +smoker   Lupus Cousin    Breast cancer Cousin        paternal 1st cousin dx. 32s   Breast cancer Cousin        paternal 1st cousin dx. 48s-30s   Cancer Cousin        paternal 1st cousin dx. oral cancer with metastasis, also "lost a leg to cancer"; dx. 34s; +EtOH abuse   Breast cancer Cousin        paternal 1st cousin, once-removed dx. at unspecified age   Colon cancer Neg Hx    Esophageal cancer Neg Hx    Rectal cancer  Neg Hx    Stomach cancer Neg Hx    Social History   Socioeconomic History   Marital status: Divorced    Spouse name: Not on file   Number of children: 2   Years of education: 12   Highest education level: Not on file  Occupational History   Occupation: retired  Tobacco Use   Smoking status: Former    Packs/day: 1.00    Years: 0.00    Total pack years: 0.00    Types: Cigarettes    Quit date: 12/24/2015    Years since quitting: 6.5   Smokeless tobacco: Never  Vaping Use   Vaping Use: Never used  Substance and Sexual Activity   Alcohol use: No    Alcohol/week: 0.0 standard drinks of alcohol    Comment: 07/31/16 pt states she dosn't drink anymore,04/16/2016 "1/2 bottle wine per month" previously - not much now   Drug use: No   Sexual activity: Never  Other Topics Concern   Not on file  Social History Narrative   Lives at home with sister and nephews.   Fun: Internet; games, talk to people, video games   Denies religious beliefs effecting health care.   Denies abuse and feels safe at home.    Social Determinants of Health   Financial Resource Strain: Low Risk  (07/05/2022)   Overall Financial Resource Strain (CARDIA)    Difficulty of Paying Living Expenses: Not hard at all  Food Insecurity: No Food Insecurity (07/05/2022)   Hunger Vital Sign    Worried About Running Out of Food in the Last Year: Never true    Ran Out of Food in the Last Year: Never true  Transportation Needs: No Transportation Needs (07/05/2022)    PRAPARE - Hydrologist (Medical): No    Lack of Transportation (Non-Medical): No  Physical Activity: Sufficiently Active (07/05/2022)   Exercise Vital Sign    Days of Exercise per Week: 5 days    Minutes of Exercise per Session: 30 min  Stress: No Stress Concern Present (07/05/2022)   [place]    Feeling of Stress : Not at all  Social Connections: Socially Isolated (07/05/2022)   Social Connection and Isolation Panel [NHANES]    Frequency of Communication with Friends and Family: More than three times a week    Frequency of Social Gatherings with Friends and Family: Once a week    Attends Religious Services: Never    Marine scientist or Organizations: No    Attends Music therapist: Never    Marital Status: Divorced    Tobacco Counseling Counseling given: Not Answered   Clinical Intake:  Pre-visit preparation completed: Yes  Pain : No/denies pain     BMI - recorded: 28.59 (as of 05/28/2022) Nutritional Status: BMI 25 -29 Overweight Nutritional Risks: None Diabetes: Yes CBG done?: No Did pt. bring in CBG monitor from home?: No  How often do you need to have someone help you when you read instructions, pamphlets, or other written materials from your doctor or pharmacy?: 1 - Never What is the last grade level you completed in school?: HSG  Diabetic? yes  Interpreter Needed?: No  Information entered by :: Lisette Abu, LPN.   Activities of Daily Living    07/05/2022   11:33 AM 07/11/2021   12:02 PM  In your present state of health, do you have any difficulty performing the following activities:  Hearing? 0 0  Vision? 0 0  Difficulty concentrating or making decisions? 1 0  Walking or climbing stairs? 0 0  Dressing or bathing? 0 0  Doing errands, shopping? 0 0  Preparing Food and eating ? N N  Using the Toilet? N N  In the past six months, have you  accidently leaked urine? N N  Do you have problems with loss of bowel control? N N  Managing your Medications? N N  Managing your Finances? N N  Housekeeping or managing your Housekeeping? N N    Patient Care Team: Biagio Borg, MD as PCP - General (Internal Medicine) Magrinat, Virgie Dad, MD (Inactive) as Consulting Physician (Oncology) Kyung Rudd, MD as Consulting Physician (Radiation Oncology) Stark Klein, MD as Consulting Physician (General Surgery) [facility] as Consulting Physician (Ophthalmology)  Indicate any recent Medical Services you may have received from other than [facility] providers in the past year (date may be approximate).     Assessment:   This is a routine wellness examination for Chihiro.  Hearing/Vision screen Hearing Screening - Comments:: Patient denied any hearing difficulty.   No hearing aids.  Vision Screening - Comments:: Patient does wear corrective lenses/contacts.     Dietary issues and exercise activities discussed: Current Exercise Habits: Home exercise routine, Type of exercise: walking;Other - see comments (stairs; active around the house), Time (Minutes): 30, Frequency (Times/Week): 5, Weekly Exercise (Minutes/Week): 150, Intensity: Moderate, Exercise limited by: None identified   Goals Addressed             This Visit's Progress    Client will verbalize knowledge of diabetes self-management as evidenced by Hgb A1C <7 or as defined by provider.            Depression Screen    07/05/2022   11:22 AM 05/28/2022    2:42 PM 01/16/2022   11:00 AM 01/16/2022   10:46 AM 08/08/2021   10:52 AM 08/08/2021   10:26 AM 07/11/2021   11:57 AM  PHQ 2/9 Scores  PHQ - 2 Score 1 1 1 1  0 0 0  PHQ- 9 Score  6  5  0     Fall Risk    07/05/2022   11:20 AM 05/28/2022    2:41 PM 01/16/2022   11:00 AM 08/08/2021   10:52 AM 08/08/2021   10:26 AM  Fall Risk   Falls in the past year? 0 0 0 0 0  Number falls in past yr: 0 0 0 0 0  Injury with Fall? 0  0 0 0 0  Risk for fall due to : No Fall Risks    No Fall Risks  Follow up Falls evaluation completed    Falls evaluation completed    FALL RISK PREVENTION PERTAINING TO THE HOME:  Any stairs in or around the home? Yes  If so, are there any without handrails? No  Home free of loose throw rugs in walkways, pet beds, electrical cords, etc? Yes  Adequate lighting in your home to reduce risk of falls? Yes   ASSISTIVE DEVICES UTILIZED TO PREVENT FALLS:  Life alert? No  Use of a cane, walker or w/c? No  Grab bars in the bathroom? No  Shower chair or bench in shower? No  Elevated toilet seat or a handicapped toilet? No   TIMED UP AND GO:  Was the test performed? No .  Length of time to ambulate 10 feet: n/a sec.   Appearance of gait: Gait not evaluated  during this visit.  Cognitive Function:    11/13/2017   11:07 AM  MMSE - Mini Mental State Exam  Not completed: Refused        07/05/2022   11:36 AM  6CIT Screen  What Year? 0 points  What month? 0 points  What time? 0 points  Count back from 20 0 points  Months in reverse 0 points  Repeat phrase 0 points  Total Score 0 points    Immunizations Immunization History  Administered Date(s) Administered   Pneumococcal Conjugate-13 07/07/2019   Pneumococcal Polysaccharide-23 07/11/2021   Tdap 07/07/2019    TDAP status: Due, Education has been provided regarding the importance of this vaccine. Advised may receive this vaccine at local pharmacy or Health Dept. Aware to provide a copy of the vaccination record if obtained from local pharmacy or Health Dept. Verbalized acceptance and understanding.  Flu Vaccine status: Declined, Education has been provided regarding the importance of this vaccine but patient still declined. Advised may receive this vaccine at local pharmacy or Health Dept. Aware to provide a copy of the vaccination record if obtained from local pharmacy or Health Dept. Verbalized acceptance and  understanding.  Pneumococcal vaccine status: Up to date  Covid-19 vaccine status: Declined, Education has been provided regarding the importance of this vaccine but patient still declined. Advised may receive this vaccine at local pharmacy or Health Dept.or vaccine clinic. Aware to provide a copy of the vaccination record if obtained from local pharmacy or Health Dept. Verbalized acceptance and understanding.  Qualifies for Shingles Vaccine? Yes   Zostavax completed No   Shingrix Completed?: No.    Education has been provided regarding the importance of this vaccine. Patient has been advised to call insurance company to determine out of pocket expense if they have not yet received this vaccine. Advised may also receive vaccine at local pharmacy or Health Dept. Verbalized acceptance and understanding.  Screening Tests Health Maintenance  Topic Date Due   Zoster Vaccines- Shingrix (1 of 2) Never done   COLONOSCOPY (Pts 45-79yr Insurance coverage will need to be confirmed)  02/27/2020   OPHTHALMOLOGY EXAM  01/29/2022   FOOT EXAM  08/08/2022   HEMOGLOBIN A1C  11/27/2022   Diabetic kidney evaluation - Urine ACR  01/16/2023   Diabetic kidney evaluation - GFR measurement  05/29/2023   TETANUS/TDAP  07/06/2029   Pneumonia Vaccine 76 Years old  Completed   DEXA SCAN  Completed   Hepatitis C Screening  Completed   HPV VACCINES  Aged Out   INFLUENZA VACCINE  Discontinued   COVID-19 Vaccine  Discontinued    Health Maintenance  Health Maintenance Due  Topic Date Due   Zoster Vaccines- Shingrix (1 of 2) Never done   COLONOSCOPY (Pts 45-474yrInsurance coverage will need to be confirmed)  02/27/2020   OPHTHALMOLOGY EXAM  01/29/2022    Colorectal cancer screening: No longer required.  (Patient declined)  Mammogram status: No longer required due to patient declined.  Bone Density status: Completed 07/28/2020. Results reflect: Bone density results: OSTEOPENIA. Repeat every 2-3 years.  Lung  Cancer Screening: (Low Dose CT Chest recommended if Age 76-80ears, 30 pack-year currently smoking OR have quit w/in 15years.) does not qualify.   Lung Cancer Screening Referral: no  Additional Screening:  Hepatitis C Screening: does qualify; Completed 07/07/2019  Vision Screening: Recommended annual ophthalmology exams for early detection of glaucoma and other disorders of the eye. Is the patient up to date with their annual eye exam?  Yes  Who is the provider or what is the name of the office in which the patient attends annual eye exams? Ritesh Poudyal, OD. If pt is not established with a provider, would they like to be referred to a provider to establish care? No .   Dental Screening: Recommended annual dental exams for proper oral hygiene  Community Resource Referral / Chronic Care Management: CRR required this visit?  No   CCM required this visit?  No      Plan:     I have personally reviewed and noted the following in the patient's chart:   Medical and social history Use of alcohol, tobacco or illicit drugs  Current medications and supplements including opioid prescriptions.  Functional ability and status Nutritional status Physical activity Advanced directives List of other physicians Hospitalizations, surgeries, and ER visits in previous 12 months Vitals Screenings to include cognitive, depression, and falls Referrals and appointments  In addition, I have reviewed and discussed with patient certain preventive protocols, quality metrics, and best practice recommendations. A written personalized care plan for preventive services as well as general preventive health recommendations were provided to patient.     Sheral Flow, LPN   5/52/0802   Nurse Notes:  There were no vitals filed for this visit. There is no height or weight on file to calculate BMI.

## 2022-07-05 NOTE — Patient Instructions (Signed)
Kristen Howe , Thank you for taking time to come for your Medicare Wellness Visit. I appreciate your ongoing commitment to your health goals. Please review the following plan we discussed and let me know if I can assist you in the future.   Screening recommendations/referrals: Colonoscopy: Declined Mammogram: Declined Bone Density: 07/28/2020; due every 2-3 years Recommended yearly ophthalmology/optometry visit for glaucoma screening and checkup Recommended yearly dental visit for hygiene and checkup  Vaccinations: Influenza vaccine:  Declined Pneumococcal vaccine: 07/07/2019, 07/11/2021 Tdap vaccine: Declined Shingles vaccine: Declined   Covid-19:Declined  Advanced directives: No  Conditions/risks identified: Yes, Type II Diabetes  Next appointment: Please schedule your next Medicare Wellness Visit with your Nurse Health Advisor in 1 year by calling [phone].   Preventive Care 31 Years and Older, Female Preventive care refers to lifestyle choices and visits with your health care provider that can promote health and wellness. What does preventive care include? A yearly physical exam. This is also called an annual well check. Dental exams once or twice a year. Routine eye exams. Ask your health care provider how often you should have your eyes checked. Personal lifestyle choices, including: Daily care of your teeth and gums. Regular physical activity. Eating a healthy diet. Avoiding tobacco and drug use. Limiting alcohol use. Practicing safe sex. Taking low-dose aspirin every day. Taking vitamin and mineral supplements as recommended by your health care provider. What happens during an annual well check? The services and screenings done by your health care provider during your annual well check will depend on your age, overall health, lifestyle risk factors, and family history of disease. Counseling  Your health care provider may ask you questions about your: Alcohol  use. Tobacco use. Drug use. Emotional well-being. Home and relationship well-being. Sexual activity. Eating habits. History of falls. Memory and ability to understand (cognition). Work and work Statistician. Reproductive health. Screening  You may have the following tests or measurements: Height, weight, and BMI. Blood pressure. Lipid and cholesterol levels. These may be checked every 5 years, or more frequently if you are over 45 years old. Skin check. Lung cancer screening. You may have this screening every year starting at age 35 if you have a 30-pack-year history of smoking and currently smoke or have quit within the past 15 years. Fecal occult blood test (FOBT) of the stool. You may have this test every year starting at age 57. Flexible sigmoidoscopy or colonoscopy. You may have a sigmoidoscopy every 5 years or a colonoscopy every 10 years starting at age 14. Hepatitis C blood test. Hepatitis B blood test. Sexually transmitted disease (STD) testing. Diabetes screening. This is done by checking your blood sugar (glucose) after you have not eaten for a while (fasting). You may have this done every 1-3 years. Bone density scan. This is done to screen for osteoporosis. You may have this done starting at age 54. Mammogram. This may be done every 1-2 years. Talk to your health care provider about how often you should have regular mammograms. Talk with your health care provider about your test results, treatment options, and if necessary, the need for more tests. Vaccines  Your health care provider may recommend certain vaccines, such as: Influenza vaccine. This is recommended every year. Tetanus, diphtheria, and acellular pertussis (Tdap, Td) vaccine. You may need a Td booster every 10 years. Zoster vaccine. You may need this after age 70. Pneumococcal 13-valent conjugate (PCV13) vaccine. One dose is recommended after age 41. Pneumococcal polysaccharide (PPSV23) vaccine. One dose is  recommended after age 63. Talk to your health care provider about which screenings and vaccines you need and how often you need them. This information is not intended to replace advice given to you by your health care provider. Make sure you discuss any questions you have with your health care provider. Document Released: 12/29/2015 Document Revised: 08/21/2016 Document Reviewed: 10/03/2015 Elsevier Interactive Patient Education  2017 [place] Prevention in the Home Falls can cause injuries. They can happen to people of all ages. There are many things you can do to make your home safe and to help prevent falls. What can I do on the outside of my home? Regularly fix the edges of walkways and driveways and fix any cracks. Remove anything that might make you trip as you walk through a door, such as a raised step or threshold. Trim any bushes or trees on the path to your home. Use bright outdoor lighting. Clear any walking paths of anything that might make someone trip, such as rocks or tools. Regularly check to see if handrails are loose or broken. Make sure that both sides of any steps have handrails. Any raised decks and porches should have guardrails on the edges. Have any leaves, snow, or ice cleared regularly. Use sand or salt on walking paths during winter. Clean up any spills in your garage right away. This includes oil or grease spills. What can I do in the bathroom? Use night lights. Install grab bars by the toilet and in the tub and shower. Do not use towel bars as grab bars. Use non-skid mats or decals in the tub or shower. If you need to sit down in the shower, use a plastic, non-slip stool. Keep the floor dry. Clean up any water that spills on the floor as soon as it happens. Remove soap buildup in the tub or shower regularly. Attach bath mats securely with double-sided non-slip rug tape. Do not have throw rugs and other things on the floor that can make you  trip. What can I do in the bedroom? Use night lights. Make sure that you have a light by your bed that is easy to reach. Do not use any sheets or blankets that are too big for your bed. They should not hang down onto the floor. Have a firm chair that has side arms. You can use this for support while you get dressed. Do not have throw rugs and other things on the floor that can make you trip. What can I do in the kitchen? Clean up any spills right away. Avoid walking on wet floors. Keep items that you use a lot in easy-to-reach places. If you need to reach something above you, use a strong step stool that has a grab bar. Keep electrical cords out of the way. Do not use floor polish or wax that makes floors slippery. If you must use wax, use non-skid floor wax. Do not have throw rugs and other things on the floor that can make you trip. What can I do with my stairs? Do not leave any items on the stairs. Make sure that there are handrails on both sides of the stairs and use them. Fix handrails that are broken or loose. Make sure that handrails are as long as the stairways. Check any carpeting to make sure that it is firmly attached to the stairs. Fix any carpet that is loose or worn. Avoid having throw rugs at the top or bottom of the stairs. If you  do have throw rugs, attach them to the floor with carpet tape. Make sure that you have a light switch at the top of the stairs and the bottom of the stairs. If you do not have them, ask someone to add them for you. What else can I do to help prevent falls? Wear shoes that: Do not have high heels. Have rubber bottoms. Are comfortable and fit you well. Are closed at the toe. Do not wear sandals. If you use a stepladder: Make sure that it is fully opened. Do not climb a closed stepladder. Make sure that both sides of the stepladder are locked into place. Ask someone to hold it for you, if possible. Clearly mark and make sure that you can  see: Any grab bars or handrails. First and last steps. Where the edge of each step is. Use tools that help you move around (mobility aids) if they are needed. These include: Canes. Walkers. Scooters. Crutches. Turn on the lights when you go into a dark area. Replace any light bulbs as soon as they burn out. Set up your furniture so you have a clear path. Avoid moving your furniture around. If any of your floors are uneven, fix them. If there are any pets around you, be aware of where they are. Review your medicines with your doctor. Some medicines can make you feel dizzy. This can increase your chance of falling. Ask your doctor what other things that you can do to help prevent falls. This information is not intended to replace advice given to you by your health care provider. Make sure you discuss any questions you have with your health care provider. Document Released: 09/28/2009 Document Revised: 05/09/2016 Document Reviewed: 01/06/2015 Elsevier Interactive Patient Education  2017 [employer].

## 2022-08-18 DIAGNOSIS — R21 Rash and other nonspecific skin eruption: Secondary | ICD-10-CM | POA: Diagnosis not present

## 2022-08-18 DIAGNOSIS — B029 Zoster without complications: Secondary | ICD-10-CM | POA: Diagnosis not present

## 2022-09-17 DIAGNOSIS — H40023 Open angle with borderline findings, high risk, bilateral: Secondary | ICD-10-CM | POA: Diagnosis not present

## 2022-09-27 ENCOUNTER — Ambulatory Visit (INDEPENDENT_AMBULATORY_CARE_PROVIDER_SITE_OTHER): Payer: Medicare Other | Admitting: Internal Medicine

## 2022-09-27 VITALS — BP 116/64 | HR 69 | Temp 98.9°F | Ht 60.0 in | Wt 147.0 lb

## 2022-09-27 DIAGNOSIS — R21 Rash and other nonspecific skin eruption: Secondary | ICD-10-CM | POA: Diagnosis not present

## 2022-09-27 DIAGNOSIS — I10 Essential (primary) hypertension: Secondary | ICD-10-CM

## 2022-09-27 DIAGNOSIS — E1165 Type 2 diabetes mellitus with hyperglycemia: Secondary | ICD-10-CM | POA: Diagnosis not present

## 2022-09-27 DIAGNOSIS — E781 Pure hyperglyceridemia: Secondary | ICD-10-CM

## 2022-09-27 MED ORDER — TRIAMCINOLONE ACETONIDE 0.1 % EX CREA: 1.0000 | TOPICAL_CREAM | Freq: Two times a day (BID) | CUTANEOUS | 1 refills | Status: DC | PRN

## 2022-09-27 NOTE — Progress Notes (Unsigned)
Patient ID: Kristen Howe, female   DOB: October 13, 1946, 76 y.o.   MRN: [id number]        Chief Complaint: follow up HTN, HLD and hyperglycemia, rash to hands and left antecubital       HPI:  Kristen Howe is a 76 y.o. female here with c/o 2 wks onset mild constant erythem rash to left antecubital, just cant stop itching and scratching, also with itching and redness to the palms as well.   Had shingles episode mild recently but this is different.  Pt denies chest pain, increased sob or doe, wheezing, orthopnea, PND, increased LE swelling, palpitations, dizziness or syncope.   Pt denies polydipsia, polyuria, or new focal neuro s/s.    Pt denies fever, wt loss, night sweats, loss of appetite, or other constitutional symptoms   Declines flu shot, shingrix for now - may have later at the pharmacy. Wt Readings from Last 3 Encounters:  09/27/22 147 lb (66.7 kg)  05/28/22 146 lb 6.4 oz (66.4 kg)  01/16/22 139 lb (63 kg)   BP Readings from Last 3 Encounters:  09/27/22 116/64  05/28/22 (!) 142/82  01/16/22 100/60         Past Medical History:  Diagnosis Date   Allergy    Anemia    as a child   Anxiety    Arthritis    "lower back" (01/25/2016)   Bilateral breast cancer ([place])    [facility] Endo 01/25/2016   Cataract    Depression    Esophageal diverticulum    GERD (gastroesophageal reflux disease)    Hyperlipidemia    Hypertension    Leg cramps    takes Flexeril   Migraine    "maybe monthly" (01/25/2016)   Osteoporosis    Pneumonia ~ 2005 X 1   Primary cancer of 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 breast ([place]) 12/14/2015   Rheumatic fever 1950s   Past Surgical History:  Procedure Laterality Date   BREAST BIOPSY Bilateral 2016   CATARACT EXTRACTION W/ INTRAOCULAR LENS  IMPLANT, BILATERAL Bilateral 05/2015-06/2015   CESAREAN SECTION  1974; 1976   COLONOSCOPY     ESOPHAGEAL MANOMETRY N/A 03/10/2017   Procedure: ESOPHAGEAL MANOMETRY (EM);  Surgeon: Ladene Artist, MD;  Location: [facility];  Service:  Endoscopy;  Laterality: N/A;   EYE MUSCLE SURGERY Bilateral 1950s   for cross eyes as a child   INSERTION OF MESH N/A 09/12/2015   Procedure: INSERTION OF MESH;  Surgeon: Erroll Luna, MD;  Location: [facility];  Service: General;  Laterality: N/A;   MASTECTOMY COMPLETE / SIMPLE W/ SENTINEL NODE BIOPSY Right 01/25/2016   MASTECTOMY MODIFIED RADICAL Left 01/25/2016   MASTECTOMY W/ SENTINEL NODE BIOPSY Bilateral 01/25/2016   Procedure: RIGHT MASTECTOMY WITH SENTINEL RIGHT LYMPH NODE BIOPSY, LEFT MODIFIED RADICAL MASTECTOMY;  Surgeon: Stark Klein, MD;  Location: [facility];  Service: General;  Laterality: Bilateral;   TONSILLECTOMY  [id number]   UMBILICAL HERNIA REPAIR N/A 09/12/2015   Procedure: REPAIR OF INCISIONAL AND UMBILICAL HERNIA;  Surgeon: Erroll Luna, MD;  Location: [facility];  Service: General;  Laterality: N/A;    reports that she quit smoking about 6 years ago. Her smoking use included cigarettes. She smoked an average of 1.00 packs per day. She has never used smokeless tobacco. She reports that she does not drink alcohol and does not use drugs. family history includes Breast cancer in her cousin, cousin, cousin, cousin, and sister; Cancer in her cousin; Cancer (age of onset: 26) in her maternal grandmother;  Diabetes in her father, paternal aunt, and paternal uncle; Gout in her paternal grandfather and paternal grandmother; Healthy in her mother; Heart Problems in her father; Lung cancer in her cousin and maternal uncle; Lupus in her cousin; Lymphoma in her father; Other in her father and mother; Stroke in her maternal grandfather. Allergies  Allergen Reactions   Multihance [Gadobenate] Itching, Rash and Other (See Comments)    Pt developed immediate itching, red eyes and rash after MR contrast. Subsided with 25 mg of benadryl. Pt will need 13 hr prep prior to future studies. 02/27/21   Current Outpatient Medications on File Prior to Visit  Medication Sig Dispense Refill   albuterol (VENTOLIN HFA) 108 (90  Base) MCG/ACT inhaler Inhale 2 puffs into the lungs every 6 (six) hours as needed for wheezing or shortness of breath. 18 g 11   Artificial Tear Ointment (DRY EYES OP) Apply 1 drop to eye daily as needed (for dry eyes).      atorvastatin (LIPITOR) 10 MG tablet Take 1 tablet (10 mg total) by mouth daily. 90 tablet 3   Calcium-Phosphorus-Vitamin D (CITRACAL +D3 PO) Take 2 tablets by mouth 2 (two) times daily.      cyclobenzaprine (FLEXERIL) 5 MG tablet Take 1 tablet (5 mg total) by mouth 3 (three) times daily as needed. 60 tablet 2   fluticasone-salmeterol (ADVAIR) 250-50 MCG/ACT AEPB INHALE 1 DOSE BY MOUTH IN THE MORNING AND AT BEDTIME 180 each 3   glucose blood (ACCU-CHEK GUIDE) test strip 1 each by Other route daily. use as directed 100 each 1   ibuprofen (ADVIL) 800 MG tablet Take 800 mg by mouth every 6 (six) hours as needed.     losartan (COZAAR) 50 MG tablet Take 1 tablet (50 mg total) by mouth daily. 90 tablet 3   Multiple Vitamins-Minerals (MULTIVITAMIN WITH MINERALS) tablet Take 1 tablet by mouth daily. Reported on 12/20/2015     pantoprazole (PROTONIX) 40 MG tablet 1 tab by mouth twice per day 180 tablet 3   venlafaxine XR (EFFEXOR-XR) 75 MG 24 hr capsule Take 1 capsule (75 mg total) by mouth daily with breakfast. 90 capsule 3   No current facility-administered medications on file prior to visit.        ROS:  All others reviewed and negative.  Objective        PE:  BP 116/64 (BP Location: Left Arm, Patient Position: Sitting, Cuff Size: Large)   Pulse 69   Temp 98.9 F (37.2 C) (Oral)   Ht 5' (1.524 m)   Wt 147 lb (66.7 kg)   SpO2 96%   BMI 28.71 kg/m                 Constitutional: Pt appears in NAD               HENT: Head: NCAT.                Right Ear: External ear normal.                 Left Ear: External ear normal.                Eyes: . Pupils are equal, round, and reactive to light. Conjunctivae and EOM are normal               Nose: without d/c or deformity                Neck: Neck supple. Gross normal ROM  Cardiovascular: Normal rate and regular rhythm.                 Pulmonary/Chest: Effort normal and breath sounds without rales or wheezing.                Abd:  Soft, NT, ND, + BS, no organomegaly               Neurological: Pt is alert. At baseline orientation, motor grossly intact               Skin: Skin is warm. LE edema -                Psychiatric: Pt behavior is normal without agitation   Micro: none  Cardiac tracings I have personally interpreted today:  none  Pertinent Radiological findings (summarize): none   Lab Results  Component Value Date   WBC 6.9 05/28/2022   HGB 13.4 05/28/2022   HCT 40.5 05/28/2022   PLT 257.0 05/28/2022   GLUCOSE 95 05/28/2022   CHOL 175 05/28/2022   TRIG 109.0 05/28/2022   HDL 45.50 05/28/2022   LDLDIRECT 137.0 06/17/2018   LDLCALC 108 (H) 05/28/2022   ALT 28 05/28/2022   AST 22 05/28/2022   NA 138 05/28/2022   K 4.0 05/28/2022   CL 104 05/28/2022   CREATININE 0.82 05/28/2022   BUN 17 05/28/2022   CO2 26 05/28/2022   TSH 3.07 05/28/2022   HGBA1C 6.3 05/28/2022   MICROALBUR 1.0 01/16/2022   Assessment/Plan:  Kristen Howe is a 76 y.o. White or Caucasian [1] female with  has a past medical history of Allergy, Anemia, Anxiety, Arthritis, Bilateral breast cancer ([place]), Cataract, Depression, Esophageal diverticulum, GERD (gastroesophageal reflux disease), Hyperlipidemia, Hypertension, Leg cramps, Migraine, Osteoporosis, Pneumonia (~ 2005 X 1), Primary cancer of 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 breast ([place]) (12/14/2015), and Rheumatic fever (1950s).  No problem-specific Assessment & Plan notes found for this encounter.  Followup: No follow-ups on file.  Cathlean Cower, MD 09/27/2022 9:25 AM [facility]

## 2022-09-27 NOTE — Patient Instructions (Signed)
Please take all new medication as prescribed - the topical steroid cream for the left elbow rash and palms itching redness  Please continue all other medications as before, and refills have been done if requested.  Please have the pharmacy call with any other refills you may need.  Please continue your efforts at being more active, low cholesterol diet, and weight control.  Please keep your appointments with your specialists as you may have planned  Please make an Appointment to return in 6 months, or sooner if needed

## 2022-09-28 ENCOUNTER — Encounter: Payer: Self-pay | Admitting: Internal Medicine

## 2022-09-28 DIAGNOSIS — R21 Rash and other nonspecific skin eruption: Secondary | ICD-10-CM | POA: Insufficient documentation

## 2022-09-28 NOTE — Assessment & Plan Note (Signed)
BP Readings from Last 3 Encounters:  09/27/22 116/64  05/28/22 (!) 142/82  01/16/22 100/60   Stable, pt to continue medical treatment losartan 50 mg qd

## 2022-09-28 NOTE — Assessment & Plan Note (Signed)
Lab Results  Component Value Date   HGBA1C 6.3 05/28/2022   Stable, pt to continue current medical treatment  - diet, wt control, excercise

## 2022-09-28 NOTE — Assessment & Plan Note (Addendum)
Lab Results  Component Value Date   CHOL 175 05/28/2022   HDL 45.50 05/28/2022   LDLCALC 108 (H) 05/28/2022   LDLDIRECT 137.0 06/17/2018   TRIG 109.0 05/28/2022   CHOLHDL 4 05/28/2022   Uncontrolled with goal ldl < 70, pt encouraged to take lipitor 10 mg qd every day

## 2022-09-28 NOTE — Assessment & Plan Note (Signed)
Consistent with dermatitis - for triam cr prn,  to f/u any worsening symptoms or concerns

## 2022-10-01 ENCOUNTER — Telehealth: Payer: Self-pay

## 2022-10-01 NOTE — Telephone Encounter (Signed)
Crystal is calling in from [facility] [employer], they faxed over several requests for patients diabetes supplies but have not received anything. Wondering if someone can call them with an update, will be re-faxing the form.

## 2022-10-15 NOTE — Telephone Encounter (Signed)
Spoke with [facility] and they have received the forms and dispensed supplies to the patient.

## 2022-11-13 ENCOUNTER — Encounter: Payer: Self-pay | Admitting: Internal Medicine

## 2022-11-13 ENCOUNTER — Ambulatory Visit (INDEPENDENT_AMBULATORY_CARE_PROVIDER_SITE_OTHER): Payer: Medicare Other | Admitting: Internal Medicine

## 2022-11-13 ENCOUNTER — Telehealth: Payer: Self-pay

## 2022-11-13 VITALS — BP 118/66 | HR 72 | Temp 98.9°F | Ht 60.0 in | Wt 144.0 lb

## 2022-11-13 DIAGNOSIS — Z860100 Personal history of colon polyps, unspecified: Secondary | ICD-10-CM

## 2022-11-13 DIAGNOSIS — Z23 Encounter for immunization: Secondary | ICD-10-CM | POA: Diagnosis not present

## 2022-11-13 DIAGNOSIS — E1165 Type 2 diabetes mellitus with hyperglycemia: Secondary | ICD-10-CM

## 2022-11-13 DIAGNOSIS — R310 Gross hematuria: Secondary | ICD-10-CM | POA: Diagnosis not present

## 2022-11-13 DIAGNOSIS — I10 Essential (primary) hypertension: Secondary | ICD-10-CM

## 2022-11-13 DIAGNOSIS — E559 Vitamin D deficiency, unspecified: Secondary | ICD-10-CM | POA: Diagnosis not present

## 2022-11-13 DIAGNOSIS — R300 Dysuria: Secondary | ICD-10-CM | POA: Diagnosis not present

## 2022-11-13 LAB — URINALYSIS, ROUTINE W REFLEX MICROSCOPIC
Nitrite: POSITIVE — AB
Specific Gravity, Urine: 1.02 (ref 1.000–1.030)
Total Protein, Urine: 100 — AB
Urine Glucose: NEGATIVE
Urobilinogen, UA: 0.2 (ref 0.0–1.0)
pH: 6.5 (ref 5.0–8.0)

## 2022-11-13 LAB — LIPID PANEL
Cholesterol: 134 mg/dL (ref 0–200)
HDL: 43.7 mg/dL (ref >39.00)
LDL Cholesterol: 67 mg/dL (ref 0–99)
NonHDL: 89.94
Total CHOL/HDL Ratio: 3
Triglycerides: 114 mg/dL (ref 0.0–149.0)
VLDL: 22.8 mg/dL (ref 0.0–40.0)

## 2022-11-13 LAB — HEPATIC FUNCTION PANEL
ALT: 19 U/L (ref 0–35)
AST: 19 U/L (ref 0–37)
Albumin: 4.4 g/dL (ref 3.5–5.2)
Alkaline Phosphatase: 69 U/L (ref 39–117)
Bilirubin, Direct: 0.1 mg/dL (ref 0.0–0.3)
Total Bilirubin: 0.5 mg/dL (ref 0.2–1.2)
Total Protein: 7.6 g/dL (ref 6.0–8.3)

## 2022-11-13 LAB — BASIC METABOLIC PANEL
BUN: 21 mg/dL (ref 6–23)
CO2: 31 mEq/L (ref 19–32)
Calcium: 10.5 mg/dL (ref 8.4–10.5)
Chloride: 105 mEq/L (ref 96–112)
Creatinine, Ser: 0.88 mg/dL (ref 0.40–1.20)
GFR: 63.94 mL/min (ref >60.00)
Glucose, Bld: 113 mg/dL — ABNORMAL HIGH (ref 70–99)
Potassium: 4.6 mEq/L (ref 3.5–5.1)
Sodium: 141 mEq/L (ref 135–145)

## 2022-11-13 LAB — VITAMIN D 25 HYDROXY (VIT D DEFICIENCY, FRACTURES): VITD: 61.03 ng/mL (ref 30.00–100.00)

## 2022-11-13 LAB — HEMOGLOBIN A1C: Hgb A1c MFr Bld: 6.5 % (ref 4.6–6.5)

## 2022-11-13 MED ORDER — CIPROFLOXACIN HCL 500 MG PO TABS: 500.0000 mg | ORAL_TABLET | Freq: Two times a day (BID) | ORAL | 0 refills | Status: AC

## 2022-11-13 MED ORDER — CYCLOBENZAPRINE HCL 5 MG PO TABS: 5.0000 mg | ORAL_TABLET | Freq: Three times a day (TID) | ORAL | 5 refills | Status: DC | PRN

## 2022-11-13 NOTE — Telephone Encounter (Signed)
Spoke with patient regarding results, has already picked up medications from the pharmacy.

## 2022-11-13 NOTE — Assessment & Plan Note (Signed)
Also for colonoscopy as is due 

## 2022-11-13 NOTE — Assessment & Plan Note (Signed)
Mild to mod, for antibx course, urine culture, to f/u any worsening symptoms or concerns

## 2022-11-13 NOTE — Assessment & Plan Note (Signed)
BP Readings from Last 3 Encounters:  11/13/22 118/66  09/27/22 116/64  05/28/22 (!) 142/82   Stable, pt to continue medical treatment losartan 50 gm qd

## 2022-11-13 NOTE — Assessment & Plan Note (Signed)
Will also need urology referral if culture is negative

## 2022-11-13 NOTE — Assessment & Plan Note (Signed)
Lab Results  Component Value Date   HGBA1C 6.5 11/13/2022   Stable, pt to continue current medical treatment  - diet, wt control, excercise

## 2022-11-13 NOTE — Progress Notes (Signed)
Patient ID: Kristen Howe, female   DOB: [birth year], 76 y.o.   MRN: [id number]        Chief Complaint: follow up HTN, HLD and hyperglycemia , dysuria with hematuria       HPI:  Kristen Howe is a 76 y.o. female here with c/o 3 days onset dysuria gradully worsening mild to mod intermittent but Denies urinary symptoms such as frequency, urgency, flank pain, or n/v, fever, chills, though did have small amount BRB with urine as well.  .  Pt denies chest pain, increased sob or doe, wheezing, orthopnea, PND, increased LE swelling, palpitations, dizziness or syncope.   Pt denies polydipsia, polyuria, or new focal neuro s/s.    Pt denies fever, wt loss, night sweats, loss of appetite, or other constitutional symptoms  Due for flu shot.    Due for colonoscopy, plans to have shingrix at pharmacy Wt Readings from Last 3 Encounters:  11/13/22 144 lb (65.3 kg)  09/27/22 147 lb (66.7 kg)  05/28/22 146 lb 6.4 oz (66.4 kg)   BP Readings from Last 3 Encounters:  11/13/22 118/66  09/27/22 116/64  05/28/22 (!) 142/82         Past Medical History:  Diagnosis Date   Allergy    Anemia    as a child   Anxiety    Arthritis    "lower back" (01/25/2016)   Bilateral breast cancer ([place])    [facility] Endo 01/25/2016   Cataract    Depression    Esophageal diverticulum    GERD (gastroesophageal reflux disease)    Hyperlipidemia    Hypertension    Leg cramps    takes Flexeril   Migraine    "maybe monthly" (01/25/2016)   Osteoporosis    Pneumonia ~ 2005 X 1   Primary cancer of 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 breast ([place]) 12/14/2015   Rheumatic fever 1950s   Past Surgical History:  Procedure Laterality Date   BREAST BIOPSY Bilateral 2016   CATARACT EXTRACTION W/ INTRAOCULAR LENS  IMPLANT, BILATERAL Bilateral 05/2015-06/2015   CESAREAN SECTION  1974; 1976   COLONOSCOPY     ESOPHAGEAL MANOMETRY N/A 03/10/2017   Procedure: ESOPHAGEAL MANOMETRY (EM);  Surgeon: Ladene Artist, MD;  Location: [facility];  Service:  Endoscopy;  Laterality: N/A;   EYE MUSCLE SURGERY Bilateral 1950s   for cross eyes as a child   INSERTION OF MESH N/A 09/12/2015   Procedure: INSERTION OF MESH;  Surgeon: Erroll Luna, MD;  Location: [place];  Service: General;  Laterality: N/A;   MASTECTOMY COMPLETE / SIMPLE W/ SENTINEL NODE BIOPSY Right 01/25/2016   MASTECTOMY MODIFIED RADICAL Left 01/25/2016   MASTECTOMY W/ SENTINEL NODE BIOPSY Bilateral 01/25/2016   Procedure: RIGHT MASTECTOMY WITH SENTINEL RIGHT LYMPH NODE BIOPSY, LEFT MODIFIED RADICAL MASTECTOMY;  Surgeon: Stark Klein, MD;  Location: [place];  Service: General;  Laterality: Bilateral;   TONSILLECTOMY  1572I   UMBILICAL HERNIA REPAIR N/A 09/12/2015   Procedure: REPAIR OF INCISIONAL AND UMBILICAL HERNIA;  Surgeon: Erroll Luna, MD;  Location: [place];  Service: General;  Laterality: N/A;    reports that she quit smoking about 6 years ago. Her smoking use included cigarettes. She smoked an average of 1.00 packs per day. She has never used smokeless tobacco. She reports that she does not drink alcohol and does not use drugs. family history includes Breast cancer in her cousin, cousin, cousin, cousin, and sister; Cancer in her cousin; Cancer (age of onset: 76) in her maternal grandmother; Diabetes  in her father, paternal aunt, and paternal uncle; Gout in her paternal grandfather and paternal grandmother; Healthy in her mother; Heart Problems in her father; Lung cancer in her cousin and maternal uncle; Lupus in her cousin; Lymphoma in her father; Other in her father and mother; Stroke in her maternal grandfather. Allergies  Allergen Reactions   Multihance [Gadobenate] Itching, Rash and Other (See Comments)    Pt developed immediate itching, red eyes and rash after MR contrast. Subsided with 25 mg of benadryl. Pt will need 13 hr prep prior to future studies. 02/27/21   Current Outpatient Medications on File Prior to Visit  Medication Sig Dispense Refill   albuterol (VENTOLIN HFA) 108 (90  Base) MCG/ACT inhaler Inhale 2 puffs into the lungs every 6 (six) hours as needed for wheezing or shortness of breath. 18 g 11   Artificial Tear Ointment (DRY EYES OP) Apply 1 drop to eye daily as needed (for dry eyes).      atorvastatin (LIPITOR) 10 MG tablet Take 1 tablet (10 mg total) by mouth daily. 90 tablet 3   Blood Glucose Calibration (ONETOUCH VERIO) High SOLN      Blood Glucose Monitoring Suppl (ONETOUCH VERIO REFLECT) w/Device KIT      Calcium-Phosphorus-Vitamin D (CITRACAL +D3 PO) Take 2 tablets by mouth 2 (two) times daily.      fluticasone-salmeterol (ADVAIR) 250-50 MCG/ACT AEPB INHALE 1 DOSE BY MOUTH IN THE MORNING AND AT BEDTIME 180 each 3   glucose blood (ACCU-CHEK GUIDE) test strip 1 each by Other route daily. use as directed 100 each 1   ibuprofen (ADVIL) 800 MG tablet Take 800 mg by mouth every 6 (six) hours as needed.     Lancet Devices (SIMPLE DIAGNOSTICS LANCING DEV) MISC Apply topically.     losartan (COZAAR) 50 MG tablet Take 1 tablet (50 mg total) by mouth daily. 90 tablet 3   Multiple Vitamins-Minerals (MULTIVITAMIN WITH MINERALS) tablet Take 1 tablet by mouth daily. Reported on 12/20/2015     pantoprazole (PROTONIX) 40 MG tablet 1 tab by mouth twice per day 180 tablet 3   PREVNAR 20 0.5 ML injection      triamcinolone cream (KENALOG) 0.1 % Apply 1 Application topically 2 (two) times daily as needed. 30 g 1   venlafaxine XR (EFFEXOR-XR) 75 MG 24 hr capsule Take 1 capsule (75 mg total) by mouth daily with breakfast. 90 capsule 3   No current facility-administered medications on file prior to visit.        ROS:  All others reviewed and negative.  Objective        PE:  BP 118/66 (BP Location: Left Arm, Patient Position: Sitting, Cuff Size: Large)   Pulse 72   Temp 98.9 F (37.2 C) (Oral)   Ht 5' (1.524 m)   Wt 144 lb (65.3 kg)   SpO2 93%   BMI 28.12 kg/m                 Constitutional: Pt appears in NAD               HENT: Head: NCAT.                Right Ear:  External ear normal.                 Left Ear: External ear normal.                Eyes: . Pupils are equal, round, and reactive to light.  Conjunctivae and EOM are normal               Nose: without d/c or deformity               Neck: Neck supple. Gross normal ROM               Cardiovascular: Normal rate and regular rhythm.                 Pulmonary/Chest: Effort normal and breath sounds without rales or wheezing.                Abd:  Soft, mild, low mid abd tender, ND, + BS, no organomegaly, no flank tender               Neurological: Pt is alert. At baseline orientation, motor grossly intact               Skin: Skin is warm. No rashes, no other new lesions, LE edema - none               Psychiatric: Pt behavior is normal without agitation   Micro: none  Cardiac tracings I have personally interpreted today:  none  Pertinent Radiological findings (summarize): none   Lab Results  Component Value Date   WBC 6.9 05/28/2022   HGB 13.4 05/28/2022   HCT 40.5 05/28/2022   PLT 257.0 05/28/2022   GLUCOSE 113 (H) 11/13/2022   CHOL 134 11/13/2022   TRIG 114.0 11/13/2022   HDL 43.70 11/13/2022   LDLDIRECT 137.0 06/17/2018   LDLCALC 67 11/13/2022   ALT 19 11/13/2022   AST 19 11/13/2022   NA 141 11/13/2022   K 4.6 11/13/2022   CL 105 11/13/2022   CREATININE 0.88 11/13/2022   BUN 21 11/13/2022   CO2 31 11/13/2022   TSH 3.07 05/28/2022   HGBA1C 6.5 11/13/2022   MICROALBUR 1.0 01/16/2022   Assessment/Plan:  Nancylee Gaines is a 76 y.o. White or Caucasian [1] female with  has a past medical history of Allergy, Anemia, Anxiety, Arthritis, Bilateral breast cancer ([place]), Cataract, Depression, Esophageal diverticulum, GERD (gastroesophageal reflux disease), Hyperlipidemia, Hypertension, Leg cramps, Migraine, Osteoporosis, Pneumonia (~ 2005 X 1), Primary cancer of 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 breast ([place]) (12/14/2015), and Rheumatic fever (1950s).  History of colon polyps Also for  colonoscopy as is due  Essential hypertension BP Readings from Last 3 Encounters:  11/13/22 118/66  09/27/22 116/64  05/28/22 (!) 142/82   Stable, pt to continue medical treatment losartan 50 gm qd   Diabetes mellitus ([place]) Lab Results  Component Value Date   HGBA1C 6.5 11/13/2022   Stable, pt to continue current medical treatment  - diet, wt control, excercise   Gross hematuria Will also need urology referral if culture is negative  Dysuria Mild to mod, for antibx course, urine culture, to f/u any worsening symptoms or concerns  Followup: Return in about 4 months (around 03/14/2023).  Cathlean Cower, MD 11/13/2022 1:21 PM [facility]

## 2022-11-13 NOTE — Patient Instructions (Addendum)
You had the flu shot today  Please have your Shingrix (shingles) shots done at your local pharmacy.  You will be contacted regarding the referral for: colonoscopy  Please continue all other medications as before, and refills have been done if requested.  Please have the pharmacy call with any other refills you may need.  Please continue your efforts at being more active, low cholesterol diet, and weight control.  Please keep your appointments with your specialists as you may have planned  Please go to the LAB at the blood drawing area for the tests to be done, including the urine testing  /You will be contacted by phone if any changes need to be made immediately.  Otherwise, you will receive a letter about your results with an explanation, but please check with MyChart first.  Please remember to sign up for MyChart if you have not done so, as this will be important to you in the future with finding out test results, communicating by private email, and scheduling acute appointments online when needed.  Please make an Appointment to return in 4 months, or sooner if needed

## 2022-11-16 LAB — URINE CULTURE

## 2023-03-17 DIAGNOSIS — H40023 Open angle with borderline findings, high risk, bilateral: Secondary | ICD-10-CM | POA: Diagnosis not present

## 2023-03-17 DIAGNOSIS — H5201 Hypermetropia, right eye: Secondary | ICD-10-CM | POA: Diagnosis not present

## 2023-03-17 DIAGNOSIS — H52223 Regular astigmatism, bilateral: Secondary | ICD-10-CM | POA: Diagnosis not present

## 2023-03-17 DIAGNOSIS — E119 Type 2 diabetes mellitus without complications: Secondary | ICD-10-CM | POA: Diagnosis not present

## 2023-03-17 LAB — HM DIABETES EYE EXAM

## 2023-05-06 ENCOUNTER — Encounter: Payer: Self-pay | Admitting: Oncology

## 2023-05-06 ENCOUNTER — Encounter: Payer: Self-pay | Admitting: Internal Medicine

## 2023-05-06 ENCOUNTER — Ambulatory Visit (INDEPENDENT_AMBULATORY_CARE_PROVIDER_SITE_OTHER): Payer: Medicare Other | Admitting: Internal Medicine

## 2023-05-06 VITALS — BP 126/82 | HR 83 | Temp 98.6°F | Ht 60.0 in | Wt 148.0 lb

## 2023-05-06 DIAGNOSIS — E785 Hyperlipidemia, unspecified: Secondary | ICD-10-CM

## 2023-05-06 DIAGNOSIS — I10 Essential (primary) hypertension: Secondary | ICD-10-CM

## 2023-05-06 DIAGNOSIS — K219 Gastro-esophageal reflux disease without esophagitis: Secondary | ICD-10-CM

## 2023-05-06 DIAGNOSIS — F419 Anxiety disorder, unspecified: Secondary | ICD-10-CM

## 2023-05-06 DIAGNOSIS — E559 Vitamin D deficiency, unspecified: Secondary | ICD-10-CM

## 2023-05-06 DIAGNOSIS — E1165 Type 2 diabetes mellitus with hyperglycemia: Secondary | ICD-10-CM | POA: Diagnosis not present

## 2023-05-06 DIAGNOSIS — R21 Rash and other nonspecific skin eruption: Secondary | ICD-10-CM | POA: Diagnosis not present

## 2023-05-06 DIAGNOSIS — E538 Deficiency of other specified B group vitamins: Secondary | ICD-10-CM

## 2023-05-06 DIAGNOSIS — M545 Low back pain, unspecified: Secondary | ICD-10-CM

## 2023-05-06 DIAGNOSIS — Z0001 Encounter for general adult medical examination with abnormal findings: Secondary | ICD-10-CM

## 2023-05-06 LAB — CBC WITH DIFFERENTIAL/PLATELET
Basophils Absolute: 0 10*3/uL (ref 0.0–0.1)
Basophils Relative: 0.6 % (ref 0.0–3.0)
Eosinophils Absolute: 0.1 10*3/uL (ref 0.0–0.7)
Eosinophils Relative: 1.4 % (ref 0.0–5.0)
HCT: 41.3 % (ref 36.0–46.0)
Hemoglobin: 13.4 g/dL (ref 12.0–15.0)
Lymphocytes Relative: 11.9 % — ABNORMAL LOW (ref 12.0–46.0)
Lymphs Abs: 0.9 10*3/uL (ref 0.7–4.0)
MCHC: 32.4 g/dL (ref 30.0–36.0)
MCV: 87.4 fl (ref 78.0–100.0)
Monocytes Absolute: 0.6 10*3/uL (ref 0.1–1.0)
Monocytes Relative: 8.8 % (ref 3.0–12.0)
Neutro Abs: 5.7 10*3/uL (ref 1.4–7.7)
Neutrophils Relative %: 77.3 % — ABNORMAL HIGH (ref 43.0–77.0)
Platelets: 247 10*3/uL (ref 150.0–400.0)
RBC: 4.72 Mil/uL (ref 3.87–5.11)
RDW: 14.3 % (ref 11.5–15.5)
WBC: 7.3 10*3/uL (ref 4.0–10.5)

## 2023-05-06 LAB — HEPATIC FUNCTION PANEL
ALT: 28 U/L (ref 0–35)
AST: 22 U/L (ref 0–37)
Albumin: 4.2 g/dL (ref 3.5–5.2)
Alkaline Phosphatase: 72 U/L (ref 39–117)
Bilirubin, Direct: 0.1 mg/dL (ref 0.0–0.3)
Total Bilirubin: 0.4 mg/dL (ref 0.2–1.2)
Total Protein: 7.5 g/dL (ref 6.0–8.3)

## 2023-05-06 LAB — LIPID PANEL
Cholesterol: 136 mg/dL (ref 0–200)
HDL: 47.2 mg/dL (ref >39.00)
LDL Cholesterol: 69 mg/dL (ref 0–99)
NonHDL: 89.05
Total CHOL/HDL Ratio: 3
Triglycerides: 102 mg/dL (ref 0.0–149.0)
VLDL: 20.4 mg/dL (ref 0.0–40.0)

## 2023-05-06 LAB — BASIC METABOLIC PANEL
BUN: 18 mg/dL (ref 6–23)
CO2: 26 mEq/L (ref 19–32)
Calcium: 9.8 mg/dL (ref 8.4–10.5)
Chloride: 104 mEq/L (ref 96–112)
Creatinine, Ser: 0.83 mg/dL (ref 0.40–1.20)
GFR: 68.36 mL/min (ref >60.00)
Glucose, Bld: 109 mg/dL — ABNORMAL HIGH (ref 70–99)
Potassium: 4.6 mEq/L (ref 3.5–5.1)
Sodium: 140 mEq/L (ref 135–145)

## 2023-05-06 LAB — VITAMIN B12: Vitamin B-12: 814 pg/mL (ref 211–911)

## 2023-05-06 LAB — TSH: TSH: 3.15 u[IU]/mL (ref 0.35–5.50)

## 2023-05-06 LAB — HEMOGLOBIN A1C: Hgb A1c MFr Bld: 6.4 % (ref 4.6–6.5)

## 2023-05-06 LAB — VITAMIN D 25 HYDROXY (VIT D DEFICIENCY, FRACTURES): VITD: 55.7 ng/mL (ref 30.00–100.00)

## 2023-05-06 MED ORDER — PANTOPRAZOLE SODIUM 40 MG PO TBEC: DELAYED_RELEASE_TABLET | ORAL | 3 refills | Status: DC

## 2023-05-06 MED ORDER — ONDANSETRON 4 MG PO TBDP: 4.0000 mg | ORAL_TABLET | Freq: Three times a day (TID) | ORAL | 0 refills | Status: AC | PRN

## 2023-05-06 MED ORDER — TRIAMCINOLONE ACETONIDE 0.1 % EX CREA: 1.0000 | TOPICAL_CREAM | Freq: Two times a day (BID) | CUTANEOUS | 1 refills | Status: AC

## 2023-05-06 MED ORDER — VENLAFAXINE HCL ER 75 MG PO CP24: 75.0000 mg | ORAL_CAPSULE | Freq: Every day | ORAL | 3 refills | Status: DC

## 2023-05-06 MED ORDER — ATORVASTATIN CALCIUM 10 MG PO TABS: 10.0000 mg | ORAL_TABLET | Freq: Every day | ORAL | 3 refills | Status: DC

## 2023-05-06 MED ORDER — TRAMADOL HCL 50 MG PO TABS: 50.0000 mg | ORAL_TABLET | Freq: Four times a day (QID) | ORAL | 0 refills | Status: AC | PRN

## 2023-05-06 MED ORDER — ALBUTEROL SULFATE HFA 108 (90 BASE) MCG/ACT IN AERS: 2.0000 | INHALATION_SPRAY | Freq: Four times a day (QID) | RESPIRATORY_TRACT | 11 refills | Status: DC | PRN

## 2023-05-06 MED ORDER — CYCLOBENZAPRINE HCL 5 MG PO TABS: 5.0000 mg | ORAL_TABLET | Freq: Three times a day (TID) | ORAL | 5 refills | Status: DC | PRN

## 2023-05-06 MED ORDER — LOSARTAN POTASSIUM 50 MG PO TABS: 50.0000 mg | ORAL_TABLET | Freq: Every day | ORAL | 3 refills | Status: DC

## 2023-05-06 NOTE — Assessment & Plan Note (Signed)
Lab Results  Component Value Date   HGBA1C 6.4 05/06/2023   Stable, pt to continue current medical treatment  - diet, wt control

## 2023-05-06 NOTE — Assessment & Plan Note (Signed)
Age and sex appropriate education and counseling updated with regular exercise and diet Referrals for preventative services - declines colonoscopy Immunizations addressed - none needed Smoking counseling  - none needed Evidence for depression or other mood disorder - none significant Most recent labs reviewed. I have personally reviewed and have noted: 1) the patient's medical and social history 2) The patient's current medications and supplements 3) The patient's height, weight, and BMI have been recorded in the chart  

## 2023-05-06 NOTE — Progress Notes (Signed)
Patient ID: Kristen Howe, female   DOB: May 24, 1946, 77 y.o.   MRN: [id number]         Chief Complaint:: wellness exam and Back Pain (And acid reflux started 3 days ago and back pain started a week ago but feels better today )  , depression, dyshidrotic eczema to hands, left upper arm rash after shingrix #1 yesterday, gerd, nausea, chronic lbp, htn       HPI:  Kristen Howe is a 77 y.o. female here for wellness exam; declines colonoscopy o/w up to date                        Also has left arm large red itchy swelling after shingrix #1 yesterday.  Also with itchy scaly rash to most fingers bilat hands; Has had mild worsening depressive symptoms, but no suicidal ideation, or panic; has ongoing anxiety, and declines change in tx or referral today.  Has had mild worsening reflux and recurring nausea having run out of protonix, but no abd pain, dysphagia, vomiting, bowel change or blood.. Pt continues to have recurring LBP without change in severity, bowel or bladder change, fever, wt loss,  worsening LE pain/numbness/weakness, gait change or falls.     Wt Readings from Last 3 Encounters:  05/06/23 148 lb (67.1 kg)  11/13/22 144 lb (65.3 kg)  09/27/22 147 lb (66.7 kg)   BP Readings from Last 3 Encounters:  05/06/23 126/82  11/13/22 118/66  09/27/22 116/64   Immunization History  Administered Date(s) Administered   Fluad Quad(high Dose 65+) 11/13/2022   Pneumococcal Conjugate-13 07/07/2019   Pneumococcal Polysaccharide-23 07/11/2021   Tdap 07/07/2019   Zoster Recombinat (Shingrix) 05/03/2023   Health Maintenance Due  Topic Date Due   Diabetic kidney evaluation - Urine ACR  01/16/2023      Past Medical History:  Diagnosis Date   Allergy    Anemia    as a child   Anxiety    Arthritis    "lower back" (01/25/2016)   Bilateral breast cancer ([facility])    H[facility]ie Perch 01/25/2016   Cataract    Depression    Esophageal diverticulum    GERD (gastroesophageal reflux disease)    Hyperlipidemia     Hypertension    Leg cramps    takes Flexeril   Migraine    "maybe monthly" (01/25/2016)   Osteoporosis    Pneumonia ~ 2005 X 1   Primary cancer of 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 breast ([facility]) 12/14/2015   Rheumatic fever 1950s   Past Surgical History:  Procedure Laterality Date   BREAST BIOPSY Bilateral 2016   CATARACT EXTRACTION W/ INTRAOCULAR LENS  IMPLANT, BILATERAL Bilateral 05/2015-06/2015   CESAREAN SECTION  1974; 1976   COLONOSCOPY     ESOPHAGEAL MANOMETRY N/A 03/10/2017   Procedure: ESOPHAGEAL MANOMETRY (EM);  Surgeon: Meryl Dare, MD;  [facility];  Service: Endoscopy;  Laterality: N/A;   EYE MUSCLE SURGERY Bilateral 1950s   for cross eyes as a child   INSERTION OF MESH N/A 09/12/2015   Procedure: INSERTION OF MESH;  Surgeon: Harriette Bouillon, MD;  Location[facility];  Service: General;  Laterality: N/A;   MASTECTOMY COMPLETE / SIMPLE W/ SENTINEL NODE BIOPSY Right 01/25/2016   MASTECTOMY MODIFIED RADICAL Left 01/25/2016   MASTECTOMY W/ SENTINEL NODE BIOPSY Bilateral 01/25/2016   Procedure: RIGHT MASTECTOMY WITH SENTINEL RIGHT LYMPH NODE BIOPSY, LEFT MODIFIED RADICAL MASTECTOMY;  Surgeon: Almond Lint, MD;  Location: [facility];  Service:  General;  Laterality: Bilateral;   TONSILLECTOMY  1950s   UMBILICAL HERNIA REPAIR N/A 09/12/2015   Procedure: REPAIR OF INCISIONAL AND UMBILICAL HERNIA;  Surgeon: Harriette Bouillon, MD;  Location: [facility];  Service: General;  Laterality: N/A;    reports that she quit smoking about 7 years ago. Her smoking use included cigarettes. She smoked an average of 1.00 packs per day. She has never used smokeless tobacco. She reports that she does not drink alcohol and does not use drugs. family history includes Breast cancer in her cousin, cousin, cousin, cousin, and sister; Cancer in her cousin; Cancer (age of onset: 1) in her maternal grandmother; Diabetes in her father, paternal aunt, and paternal uncle; Gout in her paternal grandfather and paternal  grandmother; Healthy in her mother; Heart Problems in her father; Lung cancer in her cousin and maternal uncle; Lupus in her cousin; Lymphoma in her father; Other in her father and mother; Stroke in her maternal grandfather. Allergies  Allergen Reactions   Multihance [Gadobenate] Itching, Rash and Other (See Comments)    Pt developed immediate itching, red eyes and rash after MR contrast. Subsided with 25 mg of benadryl. Pt will need 13 hr prep prior to future studies. 02/27/21   Current Outpatient Medications on File Prior to Visit  Medication Sig Dispense Refill   Artificial Tear Ointment (DRY EYES OP) Apply 1 drop to eye daily as needed (for dry eyes).      Blood Glucose Calibration (ONETOUCH VERIO) High SOLN      Blood Glucose Monitoring Suppl (ONETOUCH VERIO REFLECT) w/Device KIT      Calcium-Phosphorus-Vitamin D (CITRACAL +D3 PO) Take 2 tablets by mouth 2 (two) times daily.      fluticasone-salmeterol (ADVAIR) 250-50 MCG/ACT AEPB INHALE 1 DOSE BY MOUTH IN THE MORNING AND AT BEDTIME 180 each 3   glucose blood (ACCU-CHEK GUIDE) test strip 1 each by Other route daily. use as directed 100 each 1   ibuprofen (ADVIL) 800 MG tablet Take 800 mg by mouth every 6 (six) hours as needed.     Lancet Devices (SIMPLE DIAGNOSTICS LANCING DEV) MISC Apply topically.     Multiple Vitamins-Minerals (MULTIVITAMIN WITH MINERALS) tablet Take 1 tablet by mouth daily. Reported on 12/20/2015     PREVNAR 20 0.5 ML injection      No current facility-administered medications on file prior to visit.        ROS:  All others reviewed and negative.  Objective        PE:  BP 126/82 (BP Location: Right Arm, Patient Position: Sitting, Cuff Size: Normal)   Pulse 83   Temp 98.6 F (37 C) (Oral)   Ht 5' (1.524 m)   Wt 148 lb (67.1 kg)   SpO2 96%   BMI 28.90 kg/m                 Constitutional: Pt appears in NAD               HENT: Head: NCAT.                Right Ear: External ear normal.                 Left Ear:  External ear normal.                Eyes: . Pupils are equal, round, and reactive to light. Conjunctivae and EOM are normal  Nose: without d/c or deformity               Neck: Neck supple. Gross normal ROM               Cardiovascular: Normal rate and regular rhythm.                 Pulmonary/Chest: Effort normal and breath sounds without rales or wheezing.                Abd:  Soft, NT, ND, + BS, no organomegaly               Neurological: Pt is alert. At baseline orientation, motor grossly intact               Skin: Skin is warm. LE edema - none, has large nontender but itchy erythem rash to lateral left upper arm approx 4 x 4 cm, non fluctuant, also eczema to multiple finger               Psychiatric: Pt behavior is normal without agitation , mild depressed affect  Micro: none  Cardiac tracings I have personally interpreted today:  none  Pertinent Radiological findings (summarize): none   Lab Results  Component Value Date   WBC 7.3 05/06/2023   HGB 13.4 05/06/2023   HCT 41.3 05/06/2023   PLT 247.0 05/06/2023   GLUCOSE 109 (H) 05/06/2023   CHOL 136 05/06/2023   TRIG 102.0 05/06/2023   HDL 47.20 05/06/2023   LDLDIRECT 137.0 06/17/2018   LDLCALC 69 05/06/2023   ALT 28 05/06/2023   AST 22 05/06/2023   NA 140 05/06/2023   K 4.6 05/06/2023   CL 104 05/06/2023   CREATININE 0.83 05/06/2023   BUN 18 05/06/2023   CO2 26 05/06/2023   TSH 3.15 05/06/2023   HGBA1C 6.4 05/06/2023   MICROALBUR 1.0 01/16/2022   Assessment/Plan:  Kristen Howe is a 77 y.o. White or Caucasian [1] female with  has a past medical history of Allergy, Anemia, Anxiety, Arthritis, Bilateral breast cancer ([facility]), Cataract, Depression, Esophageal diverticulum, GERD (gastroesophageal reflux disease), Hyperlipidemia, Hypertension, Leg cramps, Migraine, Osteoporosis, Pneumonia (~ 2005 X 1), Primary cancer of 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 breast ([facility]) (12/14/2015), and Rheumatic fever  (1950s).  Encounter for well adult exam with abnormal findings Age and sex appropriate education and counseling updated with regular exercise and diet Referrals for preventative services - declines colonoscopy Immunizations addressed - none needed Smoking counseling  - none needed Evidence for depression or other mood disorder - none significant Most recent labs reviewed. I have personally reviewed and have noted: 1) the patient's medical and social history 2) The patient's current medications and supplements 3) The patient's height, weight, and BMI have been recorded in the chart   Essential hypertension BP Readings from Last 3 Encounters:  05/06/23 126/82  11/13/22 118/66  09/27/22 116/64   Stable, pt to continue medical treatment losartan 50 qd   Anxiety Mild persistent chronic, declines change in tx at this time or referral  Diabetes mellitus ([facility]) Lab Results  Component Value Date   HGBA1C 6.4 05/06/2023   Stable, pt to continue current medical treatment  - diet, wt control   Low back pain Chronic mod persistent - for tramadol prn  Rash Left upper arm s/p shingrix as well as eczema to fingers - for triam cr prn  GERD (gastroesophageal reflux disease) Mild to mod, for retart protonix 40 qd,,  to f/u any worsening  symptoms or concerns  Followup: Return in about 6 months (around 11/06/2023).  Oliver Barre, MD 05/06/2023 9:08 PM [facility]

## 2023-05-06 NOTE — Progress Notes (Signed)
The test results show that your current treatment is OK, as the tests are stable.  Please continue the same plan.  There is no other need for change of treatment or further evaluation based on these results, at this time.  thanks 

## 2023-05-06 NOTE — Assessment & Plan Note (Signed)
Left upper arm s/p shingrix as well as eczema to fingers - for triam cr prn

## 2023-05-06 NOTE — Assessment & Plan Note (Signed)
Mild persistent chronic, declines change in tx at this time or referral

## 2023-05-06 NOTE — Patient Instructions (Addendum)
Please take all new medication as prescribed - the cream for the left upper arm, and all fingers  Please take all new medication as prescribed - the zofran under the tongue as needed for nausea  Please continue all other medications as before, and refills have been done if requested.  Please have the pharmacy call with any other refills you may need.  Please continue your efforts at being more active, low cholesterol diet, and weight control.  You are otherwise up to date with prevention measures today.  Please keep your appointments with your specialists as you may have planned  Please go to the LAB at the blood drawing area for the tests to be done  You will be contacted by phone if any changes need to be made immediately.  Otherwise, you will receive a letter about your results with an explanation, but please check with MyChart first.  Please remember to sign up for MyChart if you have not done so, as this will be important to you in the future with finding out test results, communicating by private email, and scheduling acute appointments online when needed.  Please make an Appointment to return in 6 months, or sooner if needed

## 2023-05-06 NOTE — Assessment & Plan Note (Signed)
Mild to mod, for retart protonix 40 qd,,  to f/u any worsening symptoms or concerns

## 2023-05-06 NOTE — Assessment & Plan Note (Signed)
BP Readings from Last 3 Encounters:  05/06/23 126/82  11/13/22 118/66  09/27/22 116/64   Stable, pt to continue medical treatment losartan 50 qd

## 2023-05-06 NOTE — Assessment & Plan Note (Signed)
Chronic mod persistent - for tramadol prn

## 2023-05-07 LAB — URINALYSIS, ROUTINE W REFLEX MICROSCOPIC
Bilirubin Urine: NEGATIVE
Hgb urine dipstick: NEGATIVE
Ketones, ur: NEGATIVE
Nitrite: NEGATIVE
RBC / HPF: NONE SEEN (ref 0–?)
Specific Gravity, Urine: 1.025 (ref 1.000–1.030)
Total Protein, Urine: NEGATIVE
Urine Glucose: NEGATIVE
Urobilinogen, UA: 1 (ref 0.0–1.0)
pH: 6 (ref 5.0–8.0)

## 2023-05-07 LAB — MICROALBUMIN / CREATININE URINE RATIO
Creatinine,U: 202.7 mg/dL
Microalb Creat Ratio: 1.2 mg/g (ref 0.0–30.0)
Microalb, Ur: 2.3 mg/dL — ABNORMAL HIGH (ref 0.0–1.9)

## 2023-05-13 ENCOUNTER — Telehealth: Payer: Self-pay

## 2023-05-13 DIAGNOSIS — M545 Low back pain, unspecified: Secondary | ICD-10-CM

## 2023-05-13 NOTE — Telephone Encounter (Signed)
Very sorry, I dont normally treat pain with stronger meds than tramadol, but I can refer to orthopedic or pain management if she likes

## 2023-05-13 NOTE — Addendum Note (Signed)
Addended by: Corwin Levins on: 05/13/2023 04:55 PM   Modules accepted: Orders

## 2023-05-13 NOTE — Telephone Encounter (Signed)
Ok done

## 2023-05-13 NOTE — Telephone Encounter (Signed)
Pt has called and stated she wants Dr. Jonny Ruiz to know that the   traMADol (ULTRAM) 50 MG tablet  He rx'd for her back pain is not working and she is needing something else.

## 2023-05-15 ENCOUNTER — Other Ambulatory Visit (INDEPENDENT_AMBULATORY_CARE_PROVIDER_SITE_OTHER): Payer: Medicare Other

## 2023-05-15 ENCOUNTER — Encounter: Payer: Self-pay | Admitting: Physical Medicine and Rehabilitation

## 2023-05-15 ENCOUNTER — Ambulatory Visit (INDEPENDENT_AMBULATORY_CARE_PROVIDER_SITE_OTHER): Payer: Medicare Other | Admitting: Physical Medicine and Rehabilitation

## 2023-05-15 DIAGNOSIS — G8929 Other chronic pain: Secondary | ICD-10-CM | POA: Diagnosis not present

## 2023-05-15 DIAGNOSIS — M545 Low back pain, unspecified: Secondary | ICD-10-CM

## 2023-05-15 DIAGNOSIS — M4316 Spondylolisthesis, lumbar region: Secondary | ICD-10-CM

## 2023-05-15 DIAGNOSIS — M47816 Spondylosis without myelopathy or radiculopathy, lumbar region: Secondary | ICD-10-CM | POA: Diagnosis not present

## 2023-05-15 MED ORDER — MELOXICAM 15 MG PO TABS: 15.0000 mg | ORAL_TABLET | Freq: Every day | ORAL | 0 refills | Status: DC

## 2023-05-15 NOTE — Progress Notes (Signed)
Functional Pain Scale - descriptive words and definitions  Unmanageable (7)  Pain interferes with normal ADL's/nothing seems to help/sleep is very difficult/active distractions are very difficult to concentrate on. Severe range order  Average Pain  varies but can be a 10  Lower back pain that starts on the right side and go across the lower back. Can radiates in the the upper part of the legs

## 2023-05-15 NOTE — Progress Notes (Signed)
Kristen Howe - 77 y.o. female MRN [id number]  Date of birth: 1946/07/25  Office Visit Note: Visit Date: 05/15/2023 PCP: Corwin Levins, MD Referred by: Corwin Levins, MD  Subjective: Chief Complaint  Patient presents with   Lower Back - Pain   HPI: Kristen Howe is a 77 y.o. female who comes in today per the request of Dr. Oliver Barre for evaluation of acute on chronic bilateral lower back pain. Patient somewhat of poor historian and has difficulty answering questions.  Per Dr. Raphael Gibney recent office visit note patient verbalized to him that she was feeling better on that particular day. She reports chronic lower back pain for many years, worsened approximately 2 weeks ago and becomes severe with movement and activity. She describes pain as sore and aching sensation, currently rates as 9 out of 10. No relief of pain with rest and use of medications. States she has tried Tramadol and Flexeril with minimal relief of pain. No history of formal physical therapy. Lumbar x rays from 2022 exhibits multilevel degenerative changes and grade 1 anterolisthesis of L4 on L5.  No history of lumbar surgery/injections.  Patient denies focal weakness, numbness and tingling.  Patient denies recent trauma or falls.   Review of Systems  Musculoskeletal:  Positive for back pain.  Neurological:  Negative for tingling, sensory change, focal weakness and weakness.  All other systems reviewed and are negative.  Otherwise per HPI.  Assessment & Plan: Visit Diagnoses:    ICD-10-CM   1. Chronic bilateral low back pain without sciatica  M54.50 XR Lumbar Spine 2-3 Views   G89.29 Ambulatory referral to Physical Therapy    2. Spondylosis without myelopathy or radiculopathy, lumbar region  M47.816 XR Lumbar Spine 2-3 Views    Ambulatory referral to Physical Therapy    3. Anterolisthesis of lumbar spine  M43.16 Ambulatory referral to Physical Therapy       Plan: Findings:  Acute on chronic severe bilateral lower  back pain.  No radicular symptoms.  Patient continues to have severe pain despite good conservative therapies such as rest and use of medications. Patients clinical presentation and exam are consistent with acute exacerbation chronic lower back pain. Pain could also be facet mediated. I obtained 2 view lumbar x-rays in the office today that exhibits multi level degenerative changes and grade 1 anterolisthesis of L4 on L5. I discussed treatment plan with patent in detail today. Given recent onset of symptoms and non focal exam I do feel regimen of physical therapy is the next step. I do think core strengthening, balance and posture work would be beneficial for her as she does admit to sedentary lifestyle. I also prescribed short course of Meloxicam. I would like to see her back in approximately 6 weeks for re-evaluation. If her symptoms persists we discussed obtaining lumbar MRI imaging. Patient is agreeable with plan, she has no questions at this time. No red flag symptoms noted upon exam today.    Meds & Orders:  Meds ordered this encounter  Medications   meloxicam (MOBIC) 15 MG tablet    Sig: Take 1 tablet (15 mg total) by mouth daily.    Dispense:  30 tablet    Refill:  0    Orders Placed This Encounter  Procedures   XR Lumbar Spine 2-3 Views   Ambulatory referral to Physical Therapy    Follow-up: Return for 6 follow up for re-evaluation.   Procedures: No procedures performed      Clinical History: No  specialty comments available.   She reports that she quit smoking about 7 years ago. Her smoking use included cigarettes. She smoked an average of 1.00 packs per day. She has never used smokeless tobacco.  Recent Labs    05/28/22 1526 11/13/22 1040 05/06/23 1404  HGBA1C 6.3 6.5 6.4    Objective:  VS:  HT:    WT:   BMI:     BP:   HR: bpm  TEMP: ( )  RESP:  Physical Exam Vitals and nursing note reviewed.  HENT:     Head: Normocephalic and atraumatic.     Right Ear: External  ear normal.     Left Ear: External ear normal.     Nose: Nose normal.     Mouth/Throat:     Mouth: Mucous membranes are moist.  Eyes:     Extraocular Movements: Extraocular movements intact.  Cardiovascular:     Rate and Rhythm: Normal rate.     Pulses: Normal pulses.  Pulmonary:     Effort: Pulmonary effort is normal.  Abdominal:     General: Abdomen is flat. There is no distension.  Musculoskeletal:        General: Tenderness present.     Cervical back: Normal range of motion.     Comments: Patient is slow to rise from seated position to standing. Concordant low back pain with facet loading, lumbar spine extension and rotation. 5/5 strength noted with bilateral hip flexion, knee flexion/extension, ankle dorsiflexion/plantarflexion and EHL. No clonus noted bilaterally. No pain upon palpation of greater trochanters. No pain with internal/external rotation of bilateral hips. Sensation intact bilaterally. Negative slump test bilaterally. Ambulates without aid, gait slow and unsteady.    Skin:    General: Skin is warm and dry.     Capillary Refill: Capillary refill takes less than 2 seconds.  Neurological:     General: No focal deficit present.     Mental Status: She is alert and oriented to person, place, and time.  Psychiatric:        Mood and Affect: Mood normal.        Behavior: Behavior normal.     Ortho Exam  Imaging: XR Lumbar Spine 2-3 Views  Result Date: 05/15/2023 AP and lateral radiographs exhibits sacralized L5, multi level degenerative changes and grade 1 anterolisthesis at L4-L5. There are bridging osteophytes to lower thoracic spine. No acute fractures or dislocations.   Past Medical/Family/Surgical/Social History: Medications & Allergies reviewed per EMR, new medications updated. Patient Active Problem List   Diagnosis Date Noted   GERD (gastroesophageal reflux disease) 05/06/2023   Dysuria 11/13/2022   Gross hematuria 11/13/2022   Rash 09/28/2022   Dyspnea  05/31/2022   Palpitations 05/31/2022   Leg cramps 05/31/2022   Weight loss 01/16/2022   Migraine 08/11/2021   Scalp laceration 05/31/2021   Allergic rhinitis 03/31/2021   Low back pain 03/31/2021   Aortic atherosclerosis (HCC) 02/05/2021   Acquired complex cyst of kidney 02/05/2021   Encounter for well adult exam with abnormal findings 02/05/2021   Insomnia 02/05/2021   RUQ pain 08/03/2020   History of colon polyps 08/03/2020   Cough variant asthma 07/07/2019   Cough 05/05/2019   Diabetes mellitus (HCC) 10/07/2017   Patellofemoral pain syndrome of both knees 10/03/2017   Anxiety 09/29/2017   Current moderate episode of major depressive disorder (HCC) 09/29/2017   Esophageal diverticulum 04/14/2017   Esophageal motility disorder 04/14/2017   Esophageal dysphagia    Hypertriglyceridemia 09/03/2016   Medicare annual  wellness visit, subsequent 09/03/2016   Overweight (BMI 25.0-29.9) 09/03/2016   Osteoporosis 08/28/2016   Bilateral breast cancer (HCC) 01/25/2016   Former smoker 12/22/2015   Malignant neoplasm of upper-inner quadrant of left breast in female, estrogen receptor positive (HCC) 12/14/2015   Lump in female breast 11/20/2015   Umbilical hernia without obstruction and without gangrene 07/28/2015   Essential hypertension 07/28/2015   Seborrheic keratosis 07/28/2015   Past Medical History:  Diagnosis Date   Allergy    Anemia    as a child   Anxiety    Arthritis    "lower back" (01/25/2016)   Bilateral breast cancer (HCC)    Hattie Perch 01/25/2016   Cataract    Depression    Esophageal diverticulum    GERD (gastroesophageal reflux disease)    Hyperlipidemia    Hypertension    Leg cramps    takes Flexeril   Migraine    "maybe monthly" (01/25/2016)   Osteoporosis    Pneumonia ~ 2005 X 1   Primary cancer of 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 breast (HCC) 12/14/2015   Rheumatic fever 1950s   Family History  Problem Relation Age of Onset   Healthy Mother    Other  Mother        +"throat polyps"; +smoker   Lymphoma Father        d. 20   Diabetes Father    Heart Problems Father    Other Father        "tumors on his kidneys"   Breast cancer Sister        dx. metachronous bilateral breast cancers at 47 and 12; - BRCA1/2 testing   Cancer Maternal Grandmother 38       dx. spinal cancer   Stroke Maternal Grandfather    Gout Paternal Grandmother    Gout Paternal Grandfather    Lung cancer Maternal Uncle        d. 48-72; +smoker   Diabetes Paternal Aunt    Diabetes Paternal Uncle    Breast cancer Cousin        maternal 1st cousin dx. unspecified age   Lung cancer Cousin        maternal 1st cousin dx. lung cancer; +smoker   Lupus Cousin    Breast cancer Cousin        paternal 1st cousin dx. 30s   Breast cancer Cousin        paternal 1st cousin dx. 60s-30s   Cancer Cousin        paternal 1st cousin dx. oral cancer with metastasis, also "lost a leg to cancer"; dx. 37s; +EtOH abuse   Breast cancer Cousin        paternal 1st cousin, once-removed dx. at unspecified age   Colon cancer Neg Hx    Esophageal cancer Neg Hx    Rectal cancer Neg Hx    Stomach cancer Neg Hx    Past Surgical History:  Procedure Laterality Date   BREAST BIOPSY Bilateral 2016   CATARACT EXTRACTION W/ INTRAOCULAR LENS  IMPLANT, BILATERAL Bilateral 05/2015-06/2015   CESAREAN SECTION  1974; 1976   COLONOSCOPY     ESOPHAGEAL MANOMETRY N/A 03/10/2017   Procedure: ESOPHAGEAL MANOMETRY (EM);  Surgeon: Meryl Dare, MD;  Location: [facility];  Service: Endoscopy;  Laterality: N/A;   EYE MUSCLE SURGERY Bilateral 1950s   for cross eyes as a child   INSERTION OF MESH N/A 09/12/2015   Procedure: INSERTION OF MESH;  Surgeon: Harriette Bouillon, MD;  Location:  [facility];  Service: General;  Laterality: N/A;   MASTECTOMY COMPLETE / SIMPLE W/ SENTINEL NODE BIOPSY Right 01/25/2016   MASTECTOMY MODIFIED RADICAL Left 01/25/2016   MASTECTOMY W/ SENTINEL NODE BIOPSY Bilateral 01/25/2016   Procedure:  RIGHT MASTECTOMY WITH SENTINEL RIGHT LYMPH NODE BIOPSY, LEFT MODIFIED RADICAL MASTECTOMY;  Surgeon: Almond Lint, MD;  Location: [facility];  Service: General;  Laterality: Bilateral;   TONSILLECTOMY  1950s   UMBILICAL HERNIA REPAIR N/A 09/12/2015   Procedure: REPAIR OF INCISIONAL AND UMBILICAL HERNIA;  Surgeon: Harriette Bouillon, MD;  Location: [facility];  Service: General;  Laterality: N/A;   Social History   Occupational History   Occupation: retired  Tobacco Use   Smoking status: Former    Packs/day: 1.00    Years: 0.00    Additional pack years: 0.00    Total pack years: 0.00    Types: Cigarettes    Quit date: 12/24/2015    Years since quitting: 7.3   Smokeless tobacco: Never  Vaping Use   Vaping Use: Never used  Substance and Sexual Activity   Alcohol use: No    Alcohol/week: 0.0 standard drinks of alcohol    Comment: 07/31/16 pt states she dosn't drink anymore,04/16/2016 "1/2 bottle wine per month" previously - not much now   Drug use: No   Sexual activity: Never

## 2023-05-27 ENCOUNTER — Ambulatory Visit (INDEPENDENT_AMBULATORY_CARE_PROVIDER_SITE_OTHER): Payer: Medicare Other | Admitting: Physical Therapy

## 2023-05-27 ENCOUNTER — Other Ambulatory Visit: Payer: Self-pay

## 2023-05-27 ENCOUNTER — Encounter: Payer: Self-pay | Admitting: Physical Therapy

## 2023-05-27 DIAGNOSIS — M6281 Muscle weakness (generalized): Secondary | ICD-10-CM | POA: Diagnosis not present

## 2023-05-27 DIAGNOSIS — R262 Difficulty in walking, not elsewhere classified: Secondary | ICD-10-CM

## 2023-05-27 DIAGNOSIS — M5459 Other low back pain: Secondary | ICD-10-CM

## 2023-05-27 NOTE — Therapy (Signed)
OUTPATIENT PHYSICAL THERAPY THORACOLUMBAR EVALUATION   Patient Name: Kristen Howe MRN: [id number] DOB:1946/07/19, 77 y.o., female Today's Date: 05/27/2023  END OF SESSION:  PT End of Session - 05/27/23 1024     Visit Number 1    Number of Visits 20    Date for PT Re-Evaluation 08/08/23    Progress Note Due on Visit 10    PT Start Time 1020    PT Stop Time 1100    PT Time Calculation (min) 40 min    Activity Tolerance Patient tolerated treatment well    Behavior During Therapy [facility] for tasks assessed/performed             Past Medical History:  Diagnosis Date   Allergy    Anemia    as a child   Anxiety    Arthritis    "lower back" (01/25/2016)   Bilateral breast cancer (HCC)    Hattie Perch 01/25/2016   Cataract    Depression    Esophageal diverticulum    GERD (gastroesophageal reflux disease)    Hyperlipidemia    Hypertension    Leg cramps    takes Flexeril   Migraine    "maybe monthly" (01/25/2016)   Osteoporosis    Pneumonia ~ 2005 X 1   Primary cancer of 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 breast (HCC) 12/14/2015   Rheumatic fever 1950s   Past Surgical History:  Procedure Laterality Date   BREAST BIOPSY Bilateral 2016   CATARACT EXTRACTION W/ INTRAOCULAR LENS  IMPLANT, BILATERAL Bilateral 05/2015-06/2015   CESAREAN SECTION  1974; 1976   COLONOSCOPY     ESOPHAGEAL MANOMETRY N/A 03/10/2017   Procedure: ESOPHAGEAL MANOMETRY (EM);  Surgeon: Meryl Dare, MD;  Location: [facility];  Service: Endoscopy;  Laterality: N/A;   EYE MUSCLE SURGERY Bilateral 1950s   for cross eyes as a child   INSERTION OF MESH N/A 09/12/2015   Procedure: INSERTION OF MESH;  Surgeon: Harriette Bouillon, MD;  Location: [facility];  Service: General;  Laterality: N/A;   MASTECTOMY COMPLETE / SIMPLE W/ SENTINEL NODE BIOPSY Right 01/25/2016   MASTECTOMY MODIFIED RADICAL Left 01/25/2016   MASTECTOMY W/ SENTINEL NODE BIOPSY Bilateral 01/25/2016   Procedure: RIGHT MASTECTOMY WITH SENTINEL RIGHT LYMPH NODE  BIOPSY, LEFT MODIFIED RADICAL MASTECTOMY;  Surgeon: Almond Lint, MD;  Location: [facility];  Service: General;  Laterality: Bilateral;   TONSILLECTOMY  1950s   UMBILICAL HERNIA REPAIR N/A 09/12/2015   Procedure: REPAIR OF INCISIONAL AND UMBILICAL HERNIA;  Surgeon: Harriette Bouillon, MD;  Location: [facility] OR;  Service: General;  Laterality: N/A;   Patient Active Problem List   Diagnosis Date Noted   GERD (gastroesophageal reflux disease) 05/06/2023   Dysuria 11/13/2022   Gross hematuria 11/13/2022   Rash 09/28/2022   Dyspnea 05/31/2022   Palpitations 05/31/2022   Leg cramps 05/31/2022   Weight loss 01/16/2022   Migraine 08/11/2021   Scalp laceration 05/31/2021   Allergic rhinitis 03/31/2021   Low back pain 03/31/2021   Aortic atherosclerosis (HCC) 02/05/2021   Acquired complex cyst of kidney 02/05/2021   Encounter for well adult exam with abnormal findings 02/05/2021   Insomnia 02/05/2021   RUQ pain 08/03/2020   History of colon polyps 08/03/2020   Cough variant asthma 07/07/2019   Cough 05/05/2019   Diabetes mellitus (HCC) 10/07/2017   Patellofemoral pain syndrome of both knees 10/03/2017   Anxiety 09/29/2017   Current moderate episode of major depressive disorder (HCC) 09/29/2017   Esophageal diverticulum 04/14/2017   Esophageal motility  disorder 04/14/2017   Esophageal dysphagia    Hypertriglyceridemia 09/03/2016   Medicare annual wellness visit, subsequent 09/03/2016   Overweight (BMI 25.0-29.9) 09/03/2016   Osteoporosis 08/28/2016   Bilateral breast cancer (HCC) 01/25/2016   Former smoker 12/22/2015   Malignant neoplasm of upper-inner quadrant of left breast in female, estrogen receptor positive (HCC) 12/14/2015   Lump in female breast 11/20/2015   Umbilical hernia without obstruction and without gangrene 07/28/2015   Essential hypertension 07/28/2015   Seborrheic keratosis 07/28/2015    PCP:  Corwin Levins, MD   REFERRING PROVIDER: Juanda Chance, NP   REFERRING DIAG:  Diagnosis M54.50,G89.29 (ICD-10-CM) - Chronic bilateral low back pain without sciatica M47.816 (ICD-10-CM) - Spondylosis without myelopathy or radiculopathy, lumbar region M43.16 (ICD-10-CM) - Anterolisthesis of lumbar spine  Rationale for Evaluation and Treatment: Rehabilitation  THERAPY DIAG:  Other low back pain  Difficulty in walking, not elsewhere classified  Muscle weakness (generalized)  ONSET DATE: 3-4 weeks ago  SUBJECTIVE:                                                                                                                                                                                           SUBJECTIVE STATEMENT: Pt reports chronic lower back pain for many years, worsened approximately 3-4 weeks ago and becomes severe with movement and activity. Pt currently reporting no pain at rest, due to pain medications. Pt stating when her pain increases it can reach as high as 9-10/10 and she can't walk. States she has tried Tramadol and Flexeril with minimal relief of pain.   PERTINENT HISTORY:  Allergies, anxiety, arthritis, breast CA bil mastectomies, depression, HTN, Migraines, Osteoporosis, leg cramps, rheumatic fever 1950s  PAIN:  NPRS scale: no pain at rest, pain can reach 9-10/10 Pain location: low back Pain description: achy, sharp Aggravating factors: walking, standing Relieving factors: pain meds  PRECAUTIONS: h/o breast CA  WEIGHT BEARING RESTRICTIONS: No  FALLS:  Has patient fallen in last 6 months? No  LIVING ENVIRONMENT: Lives with: lives with their family and lives with their spouse Lives in: House/apartment Stairs: Yes: External: 3 steps; none, 1 flight inside c rail Has following equipment at home: None  OCCUPATION: none  PLOF: Independent  PATIENT GOALS: get around, get rid of pain  Next MD Visit: 06/26/23   OBJECTIVE:   DIAGNOSTIC FINDINGS:  Lumbar x rays from 2022 exhibits multilevel degenerative changes and grade 1  anterolisthesis of L4 on L5.   PATIENT SURVEYS:  05/27/23 FOTO eval:  46    SCREENING FOR RED FLAGS: Bowel or bladder incontinence: No Cauda equina syndrome: No  COGNITION: Overall cognitive status:  WFL normal      SENSATION: WFL  MUSCLE LENGTH: Hamstrings: Right 60 deg; Left 72 deg   POSTURE:  rounded shoulders, forward head, and decreased lumbar lordosis  PALPATION: TTP: lumbar paraspinals bilaterally  LUMBAR ROM:   AROM 05/27/23  Flexion 62  Extension 20  Right lateral flexion 15 c pain  Left lateral flexion 20 c pain  Right rotation Limited 75%  Left rotation Limited 75%   (Blank rows = not tested)  LOWER EXTREMITY ROM:       Right 05/27/23 Left eval  Hip flexion 80 92  Hip extension 10 15  Hip abduction    Hip adduction    Hip internal rotation    Hip external rotation    Knee flexion    Knee extension    Ankle dorsiflexion    Ankle plantarflexion    Ankle inversion    Ankle eversion     (Blank rows = not tested)  LOWER EXTREMITY MMT:    MMT Right 05/27/23 Left 05/27/23  Hip flexion 4- 4-  Hip extension    Hip abduction 4 4  Hip adduction 4 4  Hip internal rotation    Hip external rotation    Knee flexion 5 5  Knee extension 5   Ankle dorsiflexion    Ankle plantarflexion    Ankle inversion    Ankle eversion     (Blank rows = not tested)  LUMBAR SPECIAL TESTS:  Slump test: Negative bil LE's  FUNCTIONAL TESTS:  05/27/23: 5 times sit to stand: 12 seconds no UE support  GAIT: Distance walked: 40 feet, level surface Assistive device utilized: None Level of assistance: Complete Independence Comments: decreased foot clearance bilaterally                                                                                                                                                                                                                   TODAY'S TREATMENT:  DATE: 05/27/23  Therex: HEP instruction/performance c cues for techniques, handout provided.  Trial set performed of each for comprehension and symptom assessment.  See below for exercise list  PATIENT EDUCATION:  Education details: HEP, POC Person educated: Patient Education method: Explanation, Demonstration, Verbal cues, and Handouts Education comprehension: verbalized understanding, returned demonstration, and verbal cues required  HOME EXERCISE PROGRAM: Access Code: [email or web address] URL: [email or web address] Date: 05/27/2023 Prepared by: Narda Amber  Exercises - Supine Bridge  - 1-2 x daily - 7 x weekly - 10 reps - Supine Lower Trunk Rotation  - 1-2 x daily - 7 x weekly - 3 reps - 20 seconds hold - Hooklying Single Knee to Chest Stretch  - 1-2 x daily - 7 x weekly - 3 reps - 20 seconds hold - Hooklying Hamstring Stretch with Strap  - 1-2 x daily - 7 x weekly - 3 reps - 20 seconds hold  ASSESSMENT:  CLINICAL IMPRESSION: Patient is a 77 y.o. who comes to clinic with complaints of low back pain without sciatica. Pt with dx of spondylosis and anterolisthesis of lumbar spine. Pt with mobility, strength and movement coordination deficits that impair their ability to perform usual daily and recreational functional activities without increase difficulty/symptoms at this time.  Patient to benefit from skilled PT services to address impairments and limitations to improve to previous level of function without restriction secondary to condition.   OBJECTIVE IMPAIRMENTS: decreased activity tolerance, decreased balance, decreased mobility, difficulty walking, decreased ROM, decreased strength, and pain.   ACTIVITY LIMITATIONS: bending, sitting, standing, squatting, sleeping, and stairs  PARTICIPATION LIMITATIONS: meal prep, cleaning, laundry, and community activity  PERSONAL FACTORS: 3+ comorbidities: see pertinent history above  are also affecting  patient's functional outcome.   REHAB POTENTIAL: Good  CLINICAL DECISION MAKING: Stable/uncomplicated  EVALUATION COMPLEXITY: Low   GOALS: Goals reviewed with patient? Yes  SHORT TERM GOALS: (target date for Short term goals are 3 weeks 06/20/23)  1. Patient will demonstrate independent use of home exercise program to maintain progress from in clinic treatments.  Goal status: New  LONG TERM GOALS: (target dates for all long term goals are 10 weeks  08/08/23 )   1. Patient will demonstrate/report pain at worst less than or equal to 2/10 to facilitate minimal limitation in daily activity secondary to pain symptoms.  Goal status: New   2. Patient will demonstrate independent use of home exercise program to facilitate ability to maintain/progress functional gains from skilled physical therapy services.  Goal status: New   3. Patient will demonstrate FOTO outcome > or = 56 % to indicate reduced disability due to condition.  Goal status: New   4. Patient will demonstrate lumbar extension 100 % WFL s symptoms to facilitate upright standing, walking posture at PLOF s limitation.    Goal status: New   5.  Pt  will be able to lift 5 # from floor to counter top with no pain using correct body mechanics safely.  Goal status: New   6.  Pt will be able to perform stair navigation with single hand rail with reciprocal gait pattern.  Goal status: New     PLAN:  PT FREQUENCY: 1-2x/week  PT DURATION: 10 weeks  PLANNED INTERVENTIONS: Therapeutic exercises, Therapeutic activity, Neuro Muscular re-education, Balance training, Gait training, Patient/Family education, Joint mobilization, Stair training, DME instructions, Dry Needling, Electrical stimulation, Cryotherapy, vasopneumatic device, Moist heat, Taping, Traction Ultrasound, Ionotophoresis 4mg /ml Dexamethasone, and aquatic therapy, Manual therapy.  All included unless contraindicated  PLAN FOR  NEXT SESSION: Review HEP  knowledge/results, manual, core strengthening, posture correction exercises    Next MD Visit: 06/26/23   Sharmon Leyden, PT,  MPT 05/27/2023, 12:49 PM

## 2023-06-10 ENCOUNTER — Encounter: Payer: Self-pay | Admitting: Physical Therapy

## 2023-06-10 ENCOUNTER — Ambulatory Visit: Payer: Medicare Other | Admitting: Physical Therapy

## 2023-06-10 DIAGNOSIS — M6281 Muscle weakness (generalized): Secondary | ICD-10-CM | POA: Diagnosis not present

## 2023-06-10 DIAGNOSIS — R262 Difficulty in walking, not elsewhere classified: Secondary | ICD-10-CM | POA: Diagnosis not present

## 2023-06-10 DIAGNOSIS — M5459 Other low back pain: Secondary | ICD-10-CM

## 2023-06-10 NOTE — Therapy (Signed)
OUTPATIENT PHYSICAL THERAPY THORACOLUMBAR EVALUATION   Patient Name: Kristen Howe MRN: [id number] DOB:07-28-1946, 77 y.o., female Today's Date: 06/10/2023  END OF SESSION:  PT End of Session - 06/10/23 1024     Visit Number 2    Number of Visits 20    Date for PT Re-Evaluation 08/08/23    Progress Note Due on Visit 10    PT Start Time 1018    PT Stop Time 1056    PT Time Calculation (min) 38 min    Activity Tolerance Patient tolerated treatment well    Behavior During Therapy [facility] for tasks assessed/performed              Past Medical History:  Diagnosis Date   Allergy    Anemia    as a child   Anxiety    Arthritis    "lower back" (01/25/2016)   Bilateral breast cancer (HCC)    Hattie Perch 01/25/2016   Cataract    Depression    Esophageal diverticulum    GERD (gastroesophageal reflux disease)    Hyperlipidemia    Hypertension    Leg cramps    takes Flexeril   Migraine    "maybe monthly" (01/25/2016)   Osteoporosis    Pneumonia ~ 2005 X 1   Primary cancer of 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 breast (HCC) 12/14/2015   Rheumatic fever 1950s   Past Surgical History:  Procedure Laterality Date   BREAST BIOPSY Bilateral 2016   CATARACT EXTRACTION W/ INTRAOCULAR LENS  IMPLANT, BILATERAL Bilateral 05/2015-06/2015   CESAREAN SECTION  1974; 1976   COLONOSCOPY     ESOPHAGEAL MANOMETRY N/A 03/10/2017   Procedure: ESOPHAGEAL MANOMETRY (EM);  Surgeon: Meryl Dare, MD;  Location: [facility];  Service: Endoscopy;  Laterality: N/A;   EYE MUSCLE SURGERY Bilateral 1950s   for cross eyes as a child   INSERTION OF MESH N/A 09/12/2015   Procedure: INSERTION OF MESH;  Surgeon: Harriette Bouillon, MD;  Location: [facility] OR;  Service: General;  Laterality: N/A;   MASTECTOMY COMPLETE / SIMPLE W/ SENTINEL NODE BIOPSY Right 01/25/2016   MASTECTOMY MODIFIED RADICAL Left 01/25/2016   MASTECTOMY W/ SENTINEL NODE BIOPSY Bilateral 01/25/2016   Procedure: RIGHT MASTECTOMY WITH SENTINEL RIGHT LYMPH NODE  BIOPSY, LEFT MODIFIED RADICAL MASTECTOMY;  Surgeon: Almond Lint, MD;  Location: [facility] OR;  Service: General;  Laterality: Bilateral;   TONSILLECTOMY  1950s   UMBILICAL HERNIA REPAIR N/A 09/12/2015   Procedure: REPAIR OF INCISIONAL AND UMBILICAL HERNIA;  Surgeon: Harriette Bouillon, MD;  Location: [facility];  Service: General;  Laterality: N/A;   Patient Active Problem List   Diagnosis Date Noted   GERD (gastroesophageal reflux disease) 05/06/2023   Dysuria 11/13/2022   Gross hematuria 11/13/2022   Rash 09/28/2022   Dyspnea 05/31/2022   Palpitations 05/31/2022   Leg cramps 05/31/2022   Weight loss 01/16/2022   Migraine 08/11/2021   Scalp laceration 05/31/2021   Allergic rhinitis 03/31/2021   Low back pain 03/31/2021   Aortic atherosclerosis (HCC) 02/05/2021   Acquired complex cyst of kidney 02/05/2021   Encounter for well adult exam with abnormal findings 02/05/2021   Insomnia 02/05/2021   RUQ pain 08/03/2020   History of colon polyps 08/03/2020   Cough variant asthma 07/07/2019   Cough 05/05/2019   Diabetes mellitus (HCC) 10/07/2017   Patellofemoral pain syndrome of both knees 10/03/2017   Anxiety 09/29/2017   Current moderate episode of major depressive disorder (HCC) 09/29/2017   Esophageal diverticulum 04/14/2017   Esophageal  motility disorder 04/14/2017   Esophageal dysphagia    Hypertriglyceridemia 09/03/2016   Medicare annual wellness visit, subsequent 09/03/2016   Overweight (BMI 25.0-29.9) 09/03/2016   Osteoporosis 08/28/2016   Bilateral breast cancer (HCC) 01/25/2016   Former smoker 12/22/2015   Malignant neoplasm of upper-inner quadrant of left breast in female, estrogen receptor positive (HCC) 12/14/2015   Lump in female breast 11/20/2015   Umbilical hernia without obstruction and without gangrene 07/28/2015   Essential hypertension 07/28/2015   Seborrheic keratosis 07/28/2015    PCP:  Corwin Levins, MD   REFERRING PROVIDER: Juanda Chance, NP   REFERRING DIAG:  Diagnosis M54.50,G89.29 (ICD-10-CM) - Chronic bilateral low back pain without sciatica M47.816 (ICD-10-CM) - Spondylosis without myelopathy or radiculopathy, lumbar region M43.16 (ICD-10-CM) - Anterolisthesis of lumbar spine  Rationale for Evaluation and Treatment: Rehabilitation  THERAPY DIAG:  Other low back pain  Difficulty in walking, not elsewhere classified  Muscle weakness (generalized)  ONSET DATE: 3-4 weeks ago  SUBJECTIVE:                                                                                                                                                                                           SUBJECTIVE STATEMENT: Pt arriving today reporting 2/10 pain in her right low back. Pt stating she didn't do the exercises like prescribed due to cramping.   PERTINENT HISTORY:  Allergies, anxiety, arthritis, breast CA bil mastectomies, depression, HTN, Migraines, Osteoporosis, leg cramps, rheumatic fever 1950s  PAIN:  NPRS scale: 2/10 pain at rest, pain can reach 9-10/10 c certain activites Pain location: low back Pain description: achy, sharp Aggravating factors: walking, standing Relieving factors: pain meds  PRECAUTIONS: h/o breast CA  WEIGHT BEARING RESTRICTIONS: No  FALLS:  Has patient fallen in last 6 months? No  LIVING ENVIRONMENT: Lives with: lives with their family and lives with their spouse Lives in: House/apartment Stairs: Yes: External: 3 steps; none, 1 flight inside c rail Has following equipment at home: None  OCCUPATION: none  PLOF: Independent  PATIENT GOALS: get around, get rid of pain  Next MD Visit: 06/26/23   OBJECTIVE:   DIAGNOSTIC FINDINGS:  Lumbar x rays from 2022 exhibits multilevel degenerative changes and grade 1 anterolisthesis of L4 on L5.   PATIENT SURVEYS:  05/27/23 FOTO eval:  46    SCREENING FOR RED FLAGS: Bowel or bladder incontinence: No Cauda equina syndrome: No  COGNITION: Overall cognitive status: WFL  normal      SENSATION: WFL  MUSCLE LENGTH: Hamstrings: Right 60 deg; Left 72 deg   POSTURE:  rounded shoulders, forward head, and decreased lumbar lordosis  PALPATION: TTP:  lumbar paraspinals bilaterally  LUMBAR ROM:   AROM 05/27/23  Flexion 62  Extension 20  Right lateral flexion 15 c pain  Left lateral flexion 20 c pain  Right rotation Limited 75%  Left rotation Limited 75%   (Blank rows = not tested)  LOWER EXTREMITY ROM:       Right 05/27/23 Left eval  Hip flexion 80 92  Hip extension 10 15  Hip abduction    Hip adduction    Hip internal rotation    Hip external rotation    Knee flexion    Knee extension    Ankle dorsiflexion    Ankle plantarflexion    Ankle inversion    Ankle eversion     (Blank rows = not tested)  LOWER EXTREMITY MMT:    MMT Right 05/27/23 Left 05/27/23  Hip flexion 4- 4-  Hip extension    Hip abduction 4 4  Hip adduction 4 4  Hip internal rotation    Hip external rotation    Knee flexion 5 5  Knee extension 5   Ankle dorsiflexion    Ankle plantarflexion    Ankle inversion    Ankle eversion     (Blank rows = not tested)  LUMBAR SPECIAL TESTS:  Slump test: Negative bil LE's  FUNCTIONAL TESTS:  05/27/23: 5 times sit to stand: 12 seconds no UE support  GAIT: Distance walked: 40 feet, level surface Assistive device utilized: None Level of assistance: Complete Independence Comments: decreased foot clearance bilaterally                                                                                                                                                                                                                   TODAY'S TREATMENT:                                                                                                          DATE:  06/10/23:  TherEx:  Nustep: level 4 x 7 minutes Calf stretch: x 2 holding 30 sec Standing hip extension: 2 x 10  bil LE Trunk rotation: x 3 holding 30 sec Supine hamstring  stretch: x 2 bil LE holding 30 sec Bridges:  2 x 10 holding 3 sec SLR: 2 x 10  Sidelying hip abd 2 x 10  c cues for techinique Manual:  Percussion to lumbar paraspinals Rt > Left    05/27/23 Therex: HEP instruction/performance c cues for techniques, handout provided.  Trial set performed of each for comprehension and symptom assessment.  See below for exercise list  PATIENT EDUCATION:  Education details: HEP, POC Person educated: Patient Education method: Explanation, Demonstration, Verbal cues, and Handouts Education comprehension: verbalized understanding, returned demonstration, and verbal cues required  HOME EXERCISE PROGRAM: Access Code: [email or web address] URL: [email or web address] Date: 05/27/2023 Prepared by: Narda Amber  Exercises - Supine Bridge  - 1-2 x daily - 7 x weekly - 10 reps - Supine Lower Trunk Rotation  - 1-2 x daily - 7 x weekly - 3 reps - 20 seconds hold - Hooklying Single Knee to Chest Stretch  - 1-2 x daily - 7 x weekly - 3 reps - 20 seconds hold - Hooklying Hamstring Stretch with Strap  - 1-2 x daily - 7 x weekly - 3 reps - 20 seconds hold  ASSESSMENT:  CLINICAL IMPRESSION: Pt arriving today with 2/10 pain in her right side low back. Pt's HEP was reviewed today with no reports of cramping. Pt tolerating all exercises with minimal rest breaks noted during each exercises. Pt was edu in breathing technique during exercises. Pt with good response to percussion treatment and reporting less stiffness and pain reported.  Recommend continue skilled PT to maximize pt's function.    OBJECTIVE IMPAIRMENTS: decreased activity tolerance, decreased balance, decreased mobility, difficulty walking, decreased ROM, decreased strength, and pain.   ACTIVITY LIMITATIONS: bending, sitting, standing, squatting, sleeping, and stairs  PARTICIPATION LIMITATIONS: meal prep, cleaning, laundry, and community activity  PERSONAL FACTORS: 3+ comorbidities: see pertinent  history above  are also affecting patient's functional outcome.   REHAB POTENTIAL: Good  CLINICAL DECISION MAKING: Stable/uncomplicated  EVALUATION COMPLEXITY: Low   GOALS: Goals reviewed with patient? Yes  SHORT TERM GOALS: (target date for Short term goals are 3 weeks 06/20/23)  1. Patient will demonstrate independent use of home exercise program to maintain progress from in clinic treatments.  Goal status: On-going 06/10/23  LONG TERM GOALS: (target dates for all long term goals are 10 weeks  08/08/23 )   1. Patient will demonstrate/report pain at worst less than or equal to 2/10 to facilitate minimal limitation in daily activity secondary to pain symptoms.  Goal status: New   2. Patient will demonstrate independent use of home exercise program to facilitate ability to maintain/progress functional gains from skilled physical therapy services.  Goal status: New   3. Patient will demonstrate FOTO outcome > or = 56 % to indicate reduced disability due to condition.  Goal status: New   4. Patient will demonstrate lumbar extension 100 % WFL s symptoms to facilitate upright standing, walking posture at PLOF s limitation.    Goal status: New   5.  Pt  will be able to lift 5 # from floor to counter top with no pain using correct body mechanics safely.  Goal status: New   6.  Pt will be able to perform stair navigation with single hand rail with reciprocal gait pattern.  Goal status: New     PLAN:  PT FREQUENCY: 1-2x/week  PT DURATION: 10 weeks  PLANNED INTERVENTIONS: Therapeutic exercises, Therapeutic activity,  Neuro Muscular re-education, Balance training, Gait training, Patient/Family education, Joint mobilization, Stair training, DME instructions, Dry Needling, Electrical stimulation, Cryotherapy, vasopneumatic device, Moist heat, Taping, Traction Ultrasound, Ionotophoresis 4mg /ml Dexamethasone, and aquatic therapy, Manual therapy.  All included unless  contraindicated  PLAN FOR NEXT SESSION: Review HEP knowledge/results, manual, core strengthening, posture correction exercises    Next MD Visit: 06/26/23   Sharmon Leyden, PT,  MPT 06/10/2023, 10:57 AM

## 2023-06-11 ENCOUNTER — Other Ambulatory Visit: Payer: Self-pay | Admitting: Physical Medicine and Rehabilitation

## 2023-06-17 ENCOUNTER — Telehealth: Payer: Self-pay | Admitting: Physical Therapy

## 2023-06-17 ENCOUNTER — Encounter: Payer: Medicare Other | Admitting: Physical Therapy

## 2023-06-17 NOTE — Telephone Encounter (Signed)
I called pt to f/u after her missed PT visit this morning at 10:15. Pt stating she thought today's appointment was cancelled when she got a reminder yesterday stating her next appointment on 06/23/23 at 10:15.  Pt was reminded that her next appointment is still scheduled for 06/23/23 at 10:15.   Narda Amber, PT, MPT 06/17/23 11:04 AM

## 2023-06-23 ENCOUNTER — Ambulatory Visit: Payer: Medicare Other | Admitting: Physical Therapy

## 2023-06-23 ENCOUNTER — Encounter: Payer: Self-pay | Admitting: Physical Therapy

## 2023-06-23 DIAGNOSIS — R262 Difficulty in walking, not elsewhere classified: Secondary | ICD-10-CM

## 2023-06-23 DIAGNOSIS — M5459 Other low back pain: Secondary | ICD-10-CM

## 2023-06-23 DIAGNOSIS — M6281 Muscle weakness (generalized): Secondary | ICD-10-CM | POA: Diagnosis not present

## 2023-06-23 NOTE — Therapy (Signed)
OUTPATIENT PHYSICAL THERAPY THORACOLUMBAR   Patient Name: Kristen Howe MRN: [id number] DOB:September 26, 1946, 77 y.o., female Today's Date: 06/23/2023  END OF SESSION:  PT End of Session - 06/23/23 1024     Visit Number 3    Number of Visits 20    Date for PT Re-Evaluation 08/08/23    Progress Note Due on Visit 10    PT Start Time 1015    PT Stop Time 1055    PT Time Calculation (min) 40 min    Activity Tolerance Patient tolerated treatment well    Behavior During Therapy [facility] for tasks assessed/performed               Past Medical History:  Diagnosis Date   Allergy    Anemia    as a child   Anxiety    Arthritis    "lower back" (01/25/2016)   Bilateral breast cancer (HCC)    Hattie Perch 01/25/2016   Cataract    Depression    Esophageal diverticulum    GERD (gastroesophageal reflux disease)    Hyperlipidemia    Hypertension    Leg cramps    takes Flexeril   Migraine    "maybe monthly" (01/25/2016)   Osteoporosis    Pneumonia ~ 2005 X 1   Primary cancer of 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 breast (HCC) 12/14/2015   Rheumatic fever 1950s   Past Surgical History:  Procedure Laterality Date   BREAST BIOPSY Bilateral 2016   CATARACT EXTRACTION W/ INTRAOCULAR LENS  IMPLANT, BILATERAL Bilateral 05/2015-06/2015   CESAREAN SECTION  1974; 1976   COLONOSCOPY     ESOPHAGEAL MANOMETRY N/A 03/10/2017   Procedure: ESOPHAGEAL MANOMETRY (EM);  Surgeon: Meryl Dare, MD;  Location: [facility];  Service: Endoscopy;  Laterality: N/A;   EYE MUSCLE SURGERY Bilateral 1950s   for cross eyes as a child   INSERTION OF MESH N/A 09/12/2015   Procedure: INSERTION OF MESH;  Surgeon: Harriette Bouillon, MD;  Location: [facility] OR;  Service: General;  Laterality: N/A;   MASTECTOMY COMPLETE / SIMPLE W/ SENTINEL NODE BIOPSY Right 01/25/2016   MASTECTOMY MODIFIED RADICAL Left 01/25/2016   MASTECTOMY W/ SENTINEL NODE BIOPSY Bilateral 01/25/2016   Procedure: RIGHT MASTECTOMY WITH SENTINEL RIGHT LYMPH NODE BIOPSY, LEFT  MODIFIED RADICAL MASTECTOMY;  Surgeon: Almond Lint, MD;  Location: [facility] OR;  Service: General;  Laterality: Bilateral;   TONSILLECTOMY  1950s   UMBILICAL HERNIA REPAIR N/A 09/12/2015   Procedure: REPAIR OF INCISIONAL AND UMBILICAL HERNIA;  Surgeon: Harriette Bouillon, MD;  Location: [facility] OR;  Service: General;  Laterality: N/A;   Patient Active Problem List   Diagnosis Date Noted   GERD (gastroesophageal reflux disease) 05/06/2023   Dysuria 11/13/2022   Gross hematuria 11/13/2022   Rash 09/28/2022   Dyspnea 05/31/2022   Palpitations 05/31/2022   Leg cramps 05/31/2022   Weight loss 01/16/2022   Migraine 08/11/2021   Scalp laceration 05/31/2021   Allergic rhinitis 03/31/2021   Low back pain 03/31/2021   Aortic atherosclerosis (HCC) 02/05/2021   Acquired complex cyst of kidney 02/05/2021   Encounter for well adult exam with abnormal findings 02/05/2021   Insomnia 02/05/2021   RUQ pain 08/03/2020   History of colon polyps 08/03/2020   Cough variant asthma 07/07/2019   Cough 05/05/2019   Diabetes mellitus (HCC) 10/07/2017   Patellofemoral pain syndrome of both knees 10/03/2017   Anxiety 09/29/2017   Current moderate episode of major depressive disorder (HCC) 09/29/2017   Esophageal diverticulum 04/14/2017   Esophageal  motility disorder 04/14/2017   Esophageal dysphagia    Hypertriglyceridemia 09/03/2016   Medicare annual wellness visit, subsequent 09/03/2016   Overweight (BMI 25.0-29.9) 09/03/2016   Osteoporosis 08/28/2016   Bilateral breast cancer (HCC) 01/25/2016   Former smoker 12/22/2015   Malignant neoplasm of upper-inner quadrant of left breast in female, estrogen receptor positive (HCC) 12/14/2015   Lump in female breast 11/20/2015   Umbilical hernia without obstruction and without gangrene 07/28/2015   Essential hypertension 07/28/2015   Seborrheic keratosis 07/28/2015    PCP:  Corwin Levins, MD   REFERRING PROVIDER: Juanda Chance, NP   REFERRING DIAG:  Diagnosis M54.50,G89.29 (ICD-10-CM) - Chronic bilateral low back pain without sciatica M47.816 (ICD-10-CM) - Spondylosis without myelopathy or radiculopathy, lumbar region M43.16 (ICD-10-CM) - Anterolisthesis of lumbar spine  Rationale for Evaluation and Treatment: Rehabilitation  THERAPY DIAG:  Other low back pain  Difficulty in walking, not elsewhere classified  Muscle weakness (generalized)  ONSET DATE: 3-4 weeks ago  SUBJECTIVE:                                                                                                                                                                                           SUBJECTIVE STATEMENT: Pt arriving stating pain at rest was 1/10.   PERTINENT HISTORY:  Allergies, anxiety, arthritis, breast CA bil mastectomies, depression, HTN, Migraines, Osteoporosis, leg cramps, rheumatic fever 1950s  PAIN:  NPRS scale: 1/10 at rest, pain can reach 9-10/10 c certain activites Pain location: low back Pain description: achy, sharp Aggravating factors: walking, standing Relieving factors: pain meds  PRECAUTIONS: h/o breast CA  WEIGHT BEARING RESTRICTIONS: No  FALLS:  Has patient fallen in last 6 months? No  LIVING ENVIRONMENT: Lives with: lives with their family and lives with their spouse Lives in: House/apartment Stairs: Yes: External: 3 steps; none, 1 flight inside c rail Has following equipment at home: None  OCCUPATION: none  PLOF: Independent  PATIENT GOALS: get around, get rid of pain  Next MD Visit: 06/26/23   OBJECTIVE:   DIAGNOSTIC FINDINGS:  Lumbar x rays from 2022 exhibits multilevel degenerative changes and grade 1 anterolisthesis of L4 on L5.   PATIENT SURVEYS:  05/27/23 FOTO eval:  46    SCREENING FOR RED FLAGS: Bowel or bladder incontinence: No Cauda equina syndrome: No  COGNITION: Overall cognitive status: WFL normal      SENSATION: WFL  MUSCLE LENGTH: Hamstrings: Right 60 deg; Left 72  deg   POSTURE:  rounded shoulders, forward head, and decreased lumbar lordosis  PALPATION: TTP: lumbar paraspinals bilaterally  LUMBAR ROM:   AROM 05/27/23  Flexion 62  Extension 20  Right lateral flexion 15 c pain  Left lateral flexion 20 c pain  Right rotation Limited 75%  Left rotation Limited 75%   (Blank rows = not tested)  LOWER EXTREMITY ROM:       Right 05/27/23 Left eval  Hip flexion 80 92  Hip extension 10 15  Hip abduction    Hip adduction    Hip internal rotation    Hip external rotation    Knee flexion    Knee extension    Ankle dorsiflexion    Ankle plantarflexion    Ankle inversion    Ankle eversion     (Blank rows = not tested)  LOWER EXTREMITY MMT:    MMT Right 05/27/23 Left 05/27/23  Hip flexion 4- 4-  Hip extension    Hip abduction 4 4  Hip adduction 4 4  Hip internal rotation    Hip external rotation    Knee flexion 5 5  Knee extension 5   Ankle dorsiflexion    Ankle plantarflexion    Ankle inversion    Ankle eversion     (Blank rows = not tested)  LUMBAR SPECIAL TESTS:  Slump test: Negative bil LE's  FUNCTIONAL TESTS:  05/27/23: 5 times sit to stand: 12 seconds no UE support  GAIT: Distance walked: 40 feet, level surface Assistive device utilized: None Level of assistance: Complete Independence Comments: decreased foot clearance bilaterally                                                                                                                                                                                                                   TODAY'S TREATMENT:                                                                                                          DATE:   06/23/23:  TherEx:  Nustep: level 4 x 6 minutes Rows: green TB 2 x 10  holding 3 sec scapular squeeze Bil shoulder extension: Red TB 2 x 10  Calf stretch:  holding 60 sec x 1  Standing hip extension: 2 x 10 bil LE Standing hip flexion: 2 x 10 bil  LE Standing hip abd: 2 x 10 bil LE Trunk rotation: x 3 holding 30 sec Supine Piriformis stretch: pushing knee away x 5 holding 10 sec Bridges c clam:   2 x 10 holding 3 sec  Manual:  Percussion to lumbar paraspinals Rt > Left  (All standing activities performed c bil UE support for balance)   06/10/23:  TherEx:  Nustep: level 4 x 7 minutes Calf stretch: x 2 holding 30 sec Standing hip extension: 2 x 10 bil LE Trunk rotation: x 3 holding 30 sec Supine hamstring stretch: x 2 bil LE holding 30 sec Bridges:  2 x 10 holding 3 sec SLR: 2 x 10  Sidelying hip abd 2 x 10  c cues for techinique Manual:  Percussion to lumbar paraspinals Rt > Left  05/27/23 Therex: HEP instruction/performance c cues for techniques, handout provided.  Trial set performed of each for comprehension and symptom assessment.  See below for exercise list    PATIENT EDUCATION:  Education details: HEP, POC Person educated: Patient Education method: Explanation, Demonstration, Verbal cues, and Handouts Education comprehension: verbalized understanding, returned demonstration, and verbal cues required  HOME EXERCISE PROGRAM: Access Code: [email or web address] URL: [email or web address] Date: 05/27/2023 Prepared by: Narda Amber  Exercises - Supine Bridge  - 1-2 x daily - 7 x weekly - 10 reps - Supine Lower Trunk Rotation  - 1-2 x daily - 7 x weekly - 3 reps - 20 seconds hold - Hooklying Single Knee to Chest Stretch  - 1-2 x daily - 7 x weekly - 3 reps - 20 seconds hold - Hooklying Hamstring Stretch with Strap  - 1-2 x daily - 7 x weekly - 3 reps - 20 seconds hold  ASSESSMENT:  CLINICAL IMPRESSION: Pt arriving to therapy reporting 1/10 pain in her low back at rest. Pt still reporting increased pain with certain activities and stating more stiffness noted in the mornings. Pt tolerating all exercises well with no reports of increased pain with activities only muscular fatigue. Pt has met her STG set at her  initial evaluation and still progressing toward LTG's. Continue skilled PT to maximize pt's function.    OBJECTIVE IMPAIRMENTS: decreased activity tolerance, decreased balance, decreased mobility, difficulty walking, decreased ROM, decreased strength, and pain.   ACTIVITY LIMITATIONS: bending, sitting, standing, squatting, sleeping, and stairs  PARTICIPATION LIMITATIONS: meal prep, cleaning, laundry, and community activity  PERSONAL FACTORS: 3+ comorbidities: see pertinent history above  are also affecting patient's functional outcome.   REHAB POTENTIAL: Good  CLINICAL DECISION MAKING: Stable/uncomplicated  EVALUATION COMPLEXITY: Low   GOALS: Goals reviewed with patient? Yes  SHORT TERM GOALS: (target date for Short term goals are 3 weeks 06/20/23)  1. Patient will demonstrate independent use of home exercise program to maintain progress from in clinic treatments.  Goal status: MET 06/23/23  LONG TERM GOALS: (target dates for all long term goals are 10 weeks  08/08/23 )   1. Patient will demonstrate/report pain at worst less than or equal to 2/10 to facilitate minimal limitation in daily activity secondary to pain symptoms.  Goal status: New   2. Patient will demonstrate independent use of home exercise program to facilitate ability to maintain/progress functional gains from skilled physical therapy services.  Goal status: New   3. Patient will demonstrate FOTO outcome > or = 56 % to indicate reduced disability due to condition.  Goal status: New   4.  Patient will demonstrate lumbar extension 100 % WFL s symptoms to facilitate upright standing, walking posture at PLOF s limitation.    Goal status: New   5.  Pt  will be able to lift 5 # from floor to counter top with no pain using correct body mechanics safely.  Goal status: New   6.  Pt will be able to perform stair navigation with single hand rail with reciprocal gait pattern.  Goal status: New     PLAN:  PT  FREQUENCY: 1-2x/week  PT DURATION: 10 weeks  PLANNED INTERVENTIONS: Therapeutic exercises, Therapeutic activity, Neuro Muscular re-education, Balance training, Gait training, Patient/Family education, Joint mobilization, Stair training, DME instructions, Dry Needling, Electrical stimulation, Cryotherapy, vasopneumatic device, Moist heat, Taping, Traction Ultrasound, Ionotophoresis 4mg /ml Dexamethasone, and aquatic therapy, Manual therapy.  All included unless contraindicated  PLAN FOR NEXT SESSION: manual, core and LE  strengthening, posture correction exercises    Next MD Visit: 06/26/23 routed note to NP on 06/23/23   Sharmon Leyden, PT,  MPT 06/23/2023, 11:02 AM

## 2023-06-26 ENCOUNTER — Ambulatory Visit: Payer: Medicare Other | Admitting: Physical Medicine and Rehabilitation

## 2023-07-01 ENCOUNTER — Encounter: Payer: Medicare Other | Admitting: Physical Therapy

## 2023-07-08 ENCOUNTER — Ambulatory Visit: Payer: Medicare Other | Admitting: Physical Therapy

## 2023-07-08 ENCOUNTER — Encounter: Payer: Self-pay | Admitting: Physical Therapy

## 2023-07-08 DIAGNOSIS — M6281 Muscle weakness (generalized): Secondary | ICD-10-CM

## 2023-07-08 DIAGNOSIS — R262 Difficulty in walking, not elsewhere classified: Secondary | ICD-10-CM

## 2023-07-08 DIAGNOSIS — M5459 Other low back pain: Secondary | ICD-10-CM | POA: Diagnosis not present

## 2023-07-08 NOTE — Therapy (Signed)
OUTPATIENT PHYSICAL THERAPY THORACOLUMBAR   Patient Name: Kristen Howe MRN: [id number] DOB:[birth year], 77 y.o., female Today's Date: 07/08/2023  END OF SESSION:  PT End of Session - 07/08/23 1028     Visit Number 4    Activity Tolerance Patient tolerated treatment well    Behavior During Therapy [facility] for tasks assessed/performed                Past Medical History:  Diagnosis Date   Allergy    Anemia    as a child   Anxiety    Arthritis    "lower back" (01/25/2016)   Bilateral breast cancer (HCC)    Hattie Perch 01/25/2016   Cataract    Depression    Esophageal diverticulum    GERD (gastroesophageal reflux disease)    Hyperlipidemia    Hypertension    Leg cramps    takes Flexeril   Migraine    "maybe monthly" (01/25/2016)   Osteoporosis    Pneumonia ~ 2005 X 1   Primary cancer of 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 breast (HCC) 12/14/2015   Rheumatic fever 1950s   Past Surgical History:  Procedure Laterality Date   BREAST BIOPSY Bilateral 2016   CATARACT EXTRACTION W/ INTRAOCULAR LENS  IMPLANT, BILATERAL Bilateral 05/2015-06/2015   CESAREAN SECTION  1974; 1976   COLONOSCOPY     ESOPHAGEAL MANOMETRY N/A 03/10/2017   Procedure: ESOPHAGEAL MANOMETRY (EM);  Surgeon: Meryl Dare, MD;  Location: [facility];  Service: Endoscopy;  Laterality: N/A;   EYE MUSCLE SURGERY Bilateral 1950s   for cross eyes as a child   INSERTION OF MESH N/A 09/12/2015   Procedure: INSERTION OF MESH;  Surgeon: Harriette Bouillon, MD;  Location: [facility] OR;  Service: General;  Laterality: N/A;   MASTECTOMY COMPLETE / SIMPLE W/ SENTINEL NODE BIOPSY Right 01/25/2016   MASTECTOMY MODIFIED RADICAL Left 01/25/2016   MASTECTOMY W/ SENTINEL NODE BIOPSY Bilateral 01/25/2016   Procedure: RIGHT MASTECTOMY WITH SENTINEL RIGHT LYMPH NODE BIOPSY, LEFT MODIFIED RADICAL MASTECTOMY;  Surgeon: Almond Lint, MD;  Location: [facility] OR;  Service: General;  Laterality: Bilateral;   TONSILLECTOMY  1950s   UMBILICAL HERNIA REPAIR N/A  09/12/2015   Procedure: REPAIR OF INCISIONAL AND UMBILICAL HERNIA;  Surgeon: Harriette Bouillon, MD;  Location: [facility] OR;  Service: General;  Laterality: N/A;   Patient Active Problem List   Diagnosis Date Noted   GERD (gastroesophageal reflux disease) 05/06/2023   Dysuria 11/13/2022   Gross hematuria 11/13/2022   Rash 09/28/2022   Dyspnea 05/31/2022   Palpitations 05/31/2022   Leg cramps 05/31/2022   Weight loss 01/16/2022   Migraine 08/11/2021   Scalp laceration 05/31/2021   Allergic rhinitis 03/31/2021   Low back pain 03/31/2021   Aortic atherosclerosis (HCC) 02/05/2021   Acquired complex cyst of kidney 02/05/2021   Encounter for well adult exam with abnormal findings 02/05/2021   Insomnia 02/05/2021   RUQ pain 08/03/2020   History of colon polyps 08/03/2020   Cough variant asthma 07/07/2019   Cough 05/05/2019   Diabetes mellitus (HCC) 10/07/2017   Patellofemoral pain syndrome of both knees 10/03/2017   Anxiety 09/29/2017   Current moderate episode of major depressive disorder (HCC) 09/29/2017   Esophageal diverticulum 04/14/2017   Esophageal motility disorder 04/14/2017   Esophageal dysphagia    Hypertriglyceridemia 09/03/2016   Medicare annual wellness visit, subsequent 09/03/2016   Overweight (BMI 25.0-29.9) 09/03/2016   Osteoporosis 08/28/2016   Bilateral breast cancer (HCC) 01/25/2016   Former smoker 12/22/2015   Malignant neoplasm  of upper-inner quadrant of left breast in female, estrogen receptor positive (HCC) 12/14/2015   Lump in female breast 11/20/2015   Umbilical hernia without obstruction and without gangrene 07/28/2015   Essential hypertension 07/28/2015   Seborrheic keratosis 07/28/2015    PCP:  Corwin Levins, MD   REFERRING PROVIDER: Juanda Chance, NP   REFERRING DIAG: Diagnosis M54.50,G89.29 (ICD-10-CM) - Chronic bilateral low back pain without sciatica M47.816 (ICD-10-CM) - Spondylosis without myelopathy or radiculopathy, lumbar region M43.16  (ICD-10-CM) - Anterolisthesis of lumbar spine  Rationale for Evaluation and Treatment: Rehabilitation  THERAPY DIAG:  Other low back pain  Difficulty in walking, not elsewhere classified  Muscle weakness (generalized)  ONSET DATE: 3-4 weeks ago  SUBJECTIVE:                                                                                                                                                                                           SUBJECTIVE STATEMENT: Pt stating no pain upon arrival. Pt reporting more stiffness at times. Pt did report cramping in her inner thighs more at night.   PERTINENT HISTORY:  Allergies, anxiety, arthritis, breast CA bil mastectomies, depression, HTN, Migraines, Osteoporosis, leg cramps, rheumatic fever 1950s  PAIN:  NPRS scale:no pain upon arrival today.  Pain location: low back Pain description: stiffness Aggravating factors: walking, standing Relieving factors: pain meds  PRECAUTIONS: h/o breast CA  WEIGHT BEARING RESTRICTIONS: No  FALLS:  Has patient fallen in last 6 months? No  LIVING ENVIRONMENT: Lives with: lives with their family and lives with their spouse Lives in: House/apartment Stairs: Yes: External: 3 steps; none, 1 flight inside c rail Has following equipment at home: None  OCCUPATION: none  PLOF: Independent  PATIENT GOALS: get around, get rid of pain  Next MD Visit: 06/26/23   OBJECTIVE:   DIAGNOSTIC FINDINGS:  Lumbar x rays from 2022 exhibits multilevel degenerative changes and grade 1 anterolisthesis of L4 on L5.   PATIENT SURVEYS:  05/27/23 FOTO eval:  46    SCREENING FOR RED FLAGS: Bowel or bladder incontinence: No Cauda equina syndrome: No  COGNITION: Overall cognitive status: WFL normal      SENSATION: WFL  MUSCLE LENGTH: Hamstrings: Right 60 deg; Left 72 deg   POSTURE:  rounded shoulders, forward head, and decreased lumbar lordosis  PALPATION: TTP: lumbar paraspinals  bilaterally  LUMBAR ROM:   AROM 05/27/23 07/08/23  Flexion 62 70  Extension 20 20  Right lateral flexion 15 c pain 25  Left lateral flexion 20 c pain 22  Right rotation Limited 75% WFL  Left rotation Limited 75% Limited 25% c cramp noted in Rt  flank   (Blank rows = not tested)  LOWER EXTREMITY ROM:       Right 05/27/23 Left eval  Hip flexion 80 92  Hip extension 10 15  Hip abduction    Hip adduction    Hip internal rotation    Hip external rotation    Knee flexion    Knee extension    Ankle dorsiflexion    Ankle plantarflexion    Ankle inversion    Ankle eversion     (Blank rows = not tested)  LOWER EXTREMITY MMT:    MMT Right 05/27/23 Left 05/27/23 Rt - Left 07/08/23  Hip flexion 4- 4- 4 / 4   Hip extension     Hip abduction 4 4 4+ / 4+  Hip adduction 4 4   Hip internal rotation     Hip external rotation     Knee flexion 5 5 5  / 5  Knee extension 5  5 / 5  Ankle dorsiflexion     Ankle plantarflexion     Ankle inversion     Ankle eversion      (Blank rows = not tested)  LUMBAR SPECIAL TESTS:  Slump test: Negative bil LE's  FUNCTIONAL TESTS:  05/27/23: 5 times sit to stand: 12 seconds no UE support 07/08/23: 5 times sit to stand: 11 seconds no UE support  GAIT: Distance walked: 40 feet, level surface Assistive device utilized: None Level of assistance: Complete Independence Comments: decreased foot clearance bilaterally                                                                                                                                                                                                                   TODAY'S TREATMENT:                                                                                                          DATE:  07/08/23:  TherEx:  Nustep: level 4 x 10 minutes Standing lunge stretch for hip adductors x 2 each LE holding 20 sec c UE support  Standing lunge stretch for calves x 2 holding 20 sec c UE support Rows:  green TB 2 x 10  holding 3 sec scapular squeeze Calf stretch:  holding 60 sec x 1  Seated ball squeezes 2 x 10 holding 5 sec Trunk rotation: x 3 holding 30 sec Supine Piriformis stretch: pushing knee away x 5 holding 10 sec Bridges   2 x 10 holding 3 sec  Lifting 5# from floor to counter top x 2     06/23/23:  TherEx:  Nustep: level 4 x 6 minutes Rows: green TB 2 x 10  holding 3 sec scapular squeeze Bil shoulder extension: Red TB 2 x 10  Calf stretch:  holding 60 sec x 1  Standing hip extension: 2 x 10 bil LE Standing hip flexion: 2 x 10 bil LE Standing hip abd: 2 x 10 bil LE Trunk rotation: x 3 holding 30 sec Supine Piriformis stretch: pushing knee away x 5 holding 10 sec Bridges c clam:   2 x 10 holding 3 sec  Manual:  Percussion to lumbar paraspinals Rt > Left  (All standing activities performed c bil UE support for balance)   06/10/23:  TherEx:  Nustep: level 4 x 7 minutes Calf stretch: x 2 holding 30 sec Standing hip extension: 2 x 10 bil LE Trunk rotation: x 3 holding 30 sec Supine hamstring stretch: x 2 bil LE holding 30 sec Bridges:  2 x 10 holding 3 sec SLR: 2 x 10  Sidelying hip abd 2 x 10  c cues for techinique Manual:  Percussion to lumbar paraspinals Rt > Left      PATIENT EDUCATION:  Education details: HEP, POC Person educated: Patient Education method: Programmer, multimedia, Demonstration, Verbal cues, and Handouts Education comprehension: verbalized understanding, returned demonstration, and verbal cues required  HOME EXERCISE PROGRAM: Access Code: [email or web address] URL: [email or web address] Date: 05/27/2023 Prepared by: Narda Amber  Exercises - Supine Bridge  - 1-2 x daily - 7 x weekly - 10 reps - Supine Lower Trunk Rotation  - 1-2 x daily - 7 x weekly - 3 reps - 20 seconds hold - Hooklying Single Knee to Chest Stretch  - 1-2 x daily - 7 x weekly - 3 reps - 20 seconds hold - Hooklying Hamstring Stretch with Strap  - 1-2 x daily - 7 x weekly -  3 reps - 20 seconds hold  ASSESSMENT:  CLINICAL IMPRESSION: Pt arriving today reporting over the last week her pain has been 0-1/10. Pt stating she is experiencing more stiffness especially in the morning. Recommend continued skilled PT to maximize pt's function.    OBJECTIVE IMPAIRMENTS: decreased activity tolerance, decreased balance, decreased mobility, difficulty walking, decreased ROM, decreased strength, and pain.   ACTIVITY LIMITATIONS: bending, sitting, standing, squatting, sleeping, and stairs  PARTICIPATION LIMITATIONS: meal prep, cleaning, laundry, and community activity  PERSONAL FACTORS: 3+ comorbidities: see pertinent history above  are also affecting patient's functional outcome.   REHAB POTENTIAL: Good  CLINICAL DECISION MAKING: Stable/uncomplicated  EVALUATION COMPLEXITY: Low   GOALS: Goals reviewed with patient? Yes  SHORT TERM GOALS: (target date for Short term goals are 3 weeks 06/20/23)  1. Patient will demonstrate independent use of home exercise program to maintain progress from in clinic treatments.  Goal status: MET 06/23/23  LONG TERM GOALS: (target dates for all long term goals are 10 weeks  08/08/23 )   1. Patient will demonstrate/report pain at worst less than or equal to 2/10 to facilitate minimal limitation in daily activity  secondary to pain symptoms.  Goal status: MET 07/08/23   2. Patient will demonstrate independent use of home exercise program to facilitate ability to maintain/progress functional gains from skilled physical therapy services.  Goal status: New   3. Patient will demonstrate FOTO outcome > or = 56 % to indicate reduced disability due to condition.  Goal status: New   4. Patient will demonstrate lumbar extension 100 % WFL s symptoms to facilitate upright standing, walking posture at PLOF s limitation.    Goal status: New   5.  Pt  will be able to lift 5 # from floor to counter top with no pain using correct body mechanics  safely.  Goal status: MET 07/08/23   6.  Pt will be able to perform stair navigation with single hand rail with reciprocal gait pattern.  Goal status: New     PLAN:  PT FREQUENCY: 1-2x/week  PT DURATION: 10 weeks  PLANNED INTERVENTIONS: Therapeutic exercises, Therapeutic activity, Neuro Muscular re-education, Balance training, Gait training, Patient/Family education, Joint mobilization, Stair training, DME instructions, Dry Needling, Electrical stimulation, Cryotherapy, vasopneumatic device, Moist heat, Taping, Traction Ultrasound, Ionotophoresis 4mg /ml Dexamethasone, and aquatic therapy, Manual therapy.  All included unless contraindicated  PLAN FOR NEXT SESSION: manual, core and LE  strengthening, posture correction exercises  Repeat FOTO next visit  Next MD Visit: 06/26/23 routed note to NP on 06/23/23   Sharmon Leyden, PT,  MPT 07/08/2023, 10:38 AM

## 2023-07-16 ENCOUNTER — Encounter: Payer: Medicare Other | Admitting: Physical Therapy

## 2023-07-22 ENCOUNTER — Ambulatory Visit: Payer: Medicare Other | Admitting: Physical Therapy

## 2023-07-22 ENCOUNTER — Encounter: Payer: Self-pay | Admitting: Physical Therapy

## 2023-07-22 DIAGNOSIS — M5459 Other low back pain: Secondary | ICD-10-CM | POA: Diagnosis not present

## 2023-07-22 DIAGNOSIS — M6281 Muscle weakness (generalized): Secondary | ICD-10-CM

## 2023-07-22 DIAGNOSIS — R262 Difficulty in walking, not elsewhere classified: Secondary | ICD-10-CM | POA: Diagnosis not present

## 2023-07-22 NOTE — Therapy (Signed)
OUTPATIENT PHYSICAL THERAPY THORACOLUMBAR  Discharge   Patient Name: Kristen Howe MRN: [id number] DOB:01-May-1946, 77 y.o., female Today's Date: 07/22/2023  END OF SESSION:  PT End of Session - 07/22/23 1011     Visit Number 5    Number of Visits 20    Date for PT Re-Evaluation 08/08/23    Progress Note Due on Visit 10    PT Start Time 0935    PT Stop Time 1005    PT Time Calculation (min) 30 min    Activity Tolerance Patient tolerated treatment well    Behavior During Therapy [facility] for tasks assessed/performed                 Past Medical History:  Diagnosis Date   Allergy    Anemia    as a child   Anxiety    Arthritis    "lower back" (01/25/2016)   Bilateral breast cancer (HCC)    Hattie Perch 01/25/2016   Cataract    Depression    Esophageal diverticulum    GERD (gastroesophageal reflux disease)    Hyperlipidemia    Hypertension    Leg cramps    takes Flexeril   Migraine    "maybe monthly" (01/25/2016)   Osteoporosis    Pneumonia ~ 2005 X 1   Primary cancer of 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 breast (HCC) 12/14/2015   Rheumatic fever 1950s   Past Surgical History:  Procedure Laterality Date   BREAST BIOPSY Bilateral 2016   CATARACT EXTRACTION W/ INTRAOCULAR LENS  IMPLANT, BILATERAL Bilateral 05/2015-06/2015   CESAREAN SECTION  1974; 1976   COLONOSCOPY     ESOPHAGEAL MANOMETRY N/A 03/10/2017   Procedure: ESOPHAGEAL MANOMETRY (EM);  Surgeon: Meryl Dare, MD;  Location: [facility];  Service: Endoscopy;  Laterality: N/A;   EYE MUSCLE SURGERY Bilateral 1950s   for cross eyes as a child   INSERTION OF MESH N/A 09/12/2015   Procedure: INSERTION OF MESH;  Surgeon: Harriette Bouillon, MD;  [facility]: [facility] OR;  Service: General;  Laterality: N/A;   MASTECTOMY COMPLETE / SIMPLE W/ SENTINEL NODE BIOPSY Right 01/25/2016   MASTECTOMY MODIFIED RADICAL Left 01/25/2016   MASTECTOMY W/ SENTINEL NODE BIOPSY Bilateral 01/25/2016   Procedure: RIGHT MASTECTOMY WITH SENTINEL RIGHT LYMPH  NODE BIOPSY, LEFT MODIFIED RADICAL MASTECTOMY;  Surgeon: Almond Lint, MD;  Location: [facility] OR;  Service: General;  Laterality: Bilateral;   TONSILLECTOMY  1950s   UMBILICAL HERNIA REPAIR N/A 09/12/2015   Procedure: REPAIR OF INCISIONAL AND UMBILICAL HERNIA;  Surgeon: Harriette Bouillon, MD;  Location: [facility] OR;  Service: General;  Laterality: N/A;   Patient Active Problem List   Diagnosis Date Noted   GERD (gastroesophageal reflux disease) 05/06/2023   Dysuria 11/13/2022   Gross hematuria 11/13/2022   Rash 09/28/2022   Dyspnea 05/31/2022   Palpitations 05/31/2022   Leg cramps 05/31/2022   Weight loss 01/16/2022   Migraine 08/11/2021   Scalp laceration 05/31/2021   Allergic rhinitis 03/31/2021   Low back pain 03/31/2021   Aortic atherosclerosis (HCC) 02/05/2021   Acquired complex cyst of kidney 02/05/2021   Encounter for well adult exam with abnormal findings 02/05/2021   Insomnia 02/05/2021   RUQ pain 08/03/2020   History of colon polyps 08/03/2020   Cough variant asthma 07/07/2019   Cough 05/05/2019   Diabetes mellitus (HCC) 10/07/2017   Patellofemoral pain syndrome of both knees 10/03/2017   Anxiety 09/29/2017   Current moderate episode of major depressive disorder (HCC) 09/29/2017   Esophageal diverticulum  04/14/2017   Esophageal motility disorder 04/14/2017   Esophageal dysphagia    Hypertriglyceridemia 09/03/2016   Medicare annual wellness visit, subsequent 09/03/2016   Overweight (BMI 25.0-29.9) 09/03/2016   Osteoporosis 08/28/2016   Bilateral breast cancer (HCC) 01/25/2016   Former smoker 12/22/2015   Malignant neoplasm of upper-inner quadrant of left breast in female, estrogen receptor positive (HCC) 12/14/2015   Lump in female breast 11/20/2015   Umbilical hernia without obstruction and without gangrene 07/28/2015   Essential hypertension 07/28/2015   Seborrheic keratosis 07/28/2015    PCP:  Corwin Levins, MD   REFERRING PROVIDER: Juanda Chance, NP   REFERRING  DIAG: Diagnosis M54.50,G89.29 (ICD-10-CM) - Chronic bilateral low back pain without sciatica M47.816 (ICD-10-CM) - Spondylosis without myelopathy or radiculopathy, lumbar region M43.16 (ICD-10-CM) - Anterolisthesis of lumbar spine  Rationale for Evaluation and Treatment: Rehabilitation  THERAPY DIAG:  Other low back pain  Difficulty in walking, not elsewhere classified  Muscle weakness (generalized)  ONSET DATE: 3-4 weeks ago  SUBJECTIVE:                                                                                                                                                                                           SUBJECTIVE STATEMENT: Pt stating no pain today and pt wishing to make today her last visit.   PERTINENT HISTORY:  Allergies, anxiety, arthritis, breast CA bil mastectomies, depression, HTN, Migraines, Osteoporosis, leg cramps, rheumatic fever 1950s  PAIN:  NPRS scale:no pain  Pain location: low back Pain description: stiffness Aggravating factors: walking, standing Relieving factors: pain meds  PRECAUTIONS: h/o breast CA  WEIGHT BEARING RESTRICTIONS: No  FALLS:  Has patient fallen in last 6 months? No  LIVING ENVIRONMENT: Lives with: lives with their family and lives with their spouse Lives in: House/apartment Stairs: Yes: External: 3 steps; none, 1 flight inside c rail Has following equipment at home: None  OCCUPATION: none  PLOF: Independent  PATIENT GOALS: get around, get rid of pain  Next MD Visit: 06/26/23   OBJECTIVE:   DIAGNOSTIC FINDINGS:  Lumbar x rays from 2022 exhibits multilevel degenerative changes and grade 1 anterolisthesis of L4 on L5.   PATIENT SURVEYS:  05/27/23 FOTO eval:  46   See goals for updated FOTO  SCREENING FOR RED FLAGS: Bowel or bladder incontinence: No Cauda equina syndrome: No  COGNITION: Overall cognitive status: WFL normal      SENSATION: WFL  MUSCLE LENGTH: Hamstrings: Right 60 deg; Left 72  deg   POSTURE:  rounded shoulders, forward head, and decreased lumbar lordosis  PALPATION: TTP: lumbar paraspinals bilaterally  LUMBAR ROM:   AROM 05/27/23  07/08/23 07/22/23  Flexion 62 70 75  Extension 20 20 28   Right lateral flexion 15 c pain 25 30  Left lateral flexion 20 c pain 22 25  Right rotation Limited 75% [facility] WFL  Left rotation Limited 75% Limited 25% c cramp noted in Rt flank WFL   (Blank rows = not tested)  LOWER EXTREMITY ROM:       Right 05/27/23 Left eval Rt / Left   Hip flexion 80 92 94 / 102  Hip extension 10 15   Hip abduction     Hip adduction     Hip internal rotation     Hip external rotation     Knee flexion     Knee extension     Ankle dorsiflexion     Ankle plantarflexion     Ankle inversion     Ankle eversion      (Blank rows = not tested)  LOWER EXTREMITY MMT:    MMT Right 05/27/23 Left 05/27/23 Rt - Left 07/08/23 Rt / Left 07/22/23  Hip flexion 4- 4- 4 / 4  5 / 5  Hip extension      Hip abduction 4 4 4+ / 4+ 5 / 5   Hip adduction 4 4    Hip internal rotation      Hip external rotation      Knee flexion 5 5 5  / 5 5 / 5  Knee extension 5  5 / 5 5 / 5  Ankle dorsiflexion      Ankle plantarflexion      Ankle inversion      Ankle eversion       (Blank rows = not tested)  LUMBAR SPECIAL TESTS:  Slump test: Negative bil LE's  FUNCTIONAL TESTS:  05/27/23: 5 times sit to stand: 12 seconds no UE support 07/08/23: 5 times sit to stand: 11 seconds no UE support 07/08/23: 5 times sit to stand: 9 seconds no UE support   GAIT: Distance walked: 40 feet, level surface Assistive device utilized: None Level of assistance: Complete Independence Comments: decreased foot clearance bilaterally                                                                                                                                                                                                                   TODAY'S TREATMENT:  DATE:  07/22/23:  TherEx:  Nustep: x 6 minutes Level 5 UE/LE Hip flexion: x 2 each LE Sit to stand: x 5 Placing 5# weight from counter height to overhead shelf x 10 Rows: level 3 band x 10   MMT testing and review of pt's entire HEP  07/08/23:  TherEx:  Nustep: level 4 x 10 minutes Standing lunge stretch for hip adductors x 2 each LE holding 20 sec c UE support Standing lunge stretch for calves x 2 holding 20 sec c UE support Rows: green TB 2 x 10  holding 3 sec scapular squeeze Calf stretch:  holding 60 sec x 1  Seated ball squeezes 2 x 10 holding 5 sec Trunk rotation: x 3 holding 30 sec Supine Piriformis stretch: pushing knee away x 5 holding 10 sec Bridges   2 x 10 holding 3 sec  Lifting 5# from floor to counter top x 2     06/23/23:  TherEx:  Nustep: level 4 x 6 minutes Rows: green TB 2 x 10  holding 3 sec scapular squeeze Bil shoulder extension: Red TB 2 x 10  Calf stretch:  holding 60 sec x 1  Standing hip extension: 2 x 10 bil LE Standing hip flexion: 2 x 10 bil LE Standing hip abd: 2 x 10 bil LE Trunk rotation: x 3 holding 30 sec Supine Piriformis stretch: pushing knee away x 5 holding 10 sec Bridges c clam:   2 x 10 holding 3 sec  Manual:  Percussion to lumbar paraspinals Rt > Left  (All standing activities performed c bil UE support for balance)   06/10/23:  TherEx:  Nustep: level 4 x 7 minutes Calf stretch: x 2 holding 30 sec Standing hip extension: 2 x 10 bil LE Trunk rotation: x 3 holding 30 sec Supine hamstring stretch: x 2 bil LE holding 30 sec Bridges:  2 x 10 holding 3 sec SLR: 2 x 10  Sidelying hip abd 2 x 10  c cues for techinique Manual:  Percussion to lumbar paraspinals Rt > Left      PATIENT EDUCATION:  Education details: HEP, POC Person educated: Patient Education method: Programmer, multimedia, Demonstration, Verbal cues, and Handouts Education comprehension: verbalized  understanding, returned demonstration, and verbal cues required  HOME EXERCISE PROGRAM: Access Code: [email or web address] URL: [email or web address] Date: 07/22/2023 Prepared by: Narda Amber  Exercises - Supine Bridge  - 1-2 x daily - 7 x weekly - 10 reps - Supine Lower Trunk Rotation  - 1-2 x daily - 7 x weekly - 3 reps - 20 seconds hold - Hooklying Single Knee to Chest Stretch  - 1-2 x daily - 7 x weekly - 3 reps - 20 seconds hold - Hooklying Hamstring Stretch with Strap  - 1-2 x daily - 7 x weekly - 3 reps - 20 seconds hold - Standing Row with Anchored Resistance  - 1-2 x daily - 7 x weekly - 2 sets - 10 reps - 3 seconds hold  ASSESSMENT:  CLINICAL IMPRESSION: Pt arriving today with no pain over the last week. Pt feels she has made progress since beginning therapy. Pt has met all her STG and LTG set at her initial evaluation. Pt's HEP was updated and handout issued. Pt is being discharged today from skilled PT interventions.    OBJECTIVE IMPAIRMENTS: decreased activity tolerance, decreased balance, decreased mobility, difficulty walking, decreased ROM, decreased strength, and pain.   ACTIVITY LIMITATIONS: bending, sitting, standing, squatting, sleeping, and stairs  PARTICIPATION LIMITATIONS: meal prep, cleaning,  laundry, and community activity  PERSONAL FACTORS: 3+ comorbidities: see pertinent history above  are also affecting patient's functional outcome.   REHAB POTENTIAL: Good  CLINICAL DECISION MAKING: Stable/uncomplicated  EVALUATION COMPLEXITY: Low   GOALS: Goals reviewed with patient? Yes  SHORT TERM GOALS: (target date for Short term goals are 3 weeks 06/20/23)  1. Patient will demonstrate independent use of home exercise program to maintain progress from in clinic treatments.  Goal status: MET 06/23/23  LONG TERM GOALS: (target dates for all long term goals are 10 weeks  08/08/23 )   1. Patient will demonstrate/report pain at worst less than or equal to  2/10 to facilitate minimal limitation in daily activity secondary to pain symptoms.  Goal status: MET 07/08/23   2. Patient will demonstrate independent use of home exercise program to facilitate ability to maintain/progress functional gains from skilled physical therapy services.  Goal status: MET 07/22/23   3. Patient will demonstrate FOTO outcome > or = 56 % to indicate reduced disability due to condition.  Goal status: MET FOTO 79% 07/22/23   4. Patient will demonstrate lumbar extension 100 % WFL s symptoms to facilitate upright standing, walking posture at PLOF s limitation.    Goal status: MET 07/22/23   5.  Pt  will be able to lift 5 # from floor to counter top with no pain using correct body mechanics safely.  Goal status: MET 07/08/23   6.  Pt will be able to perform stair navigation with single hand rail with reciprocal gait pattern.  Goal status:MET 07/22/23     PLAN:  PT FREQUENCY: 1-2x/week  PT DURATION: 10 weeks  PLANNED INTERVENTIONS: Therapeutic exercises, Therapeutic activity, Neuro Muscular re-education, Balance training, Gait training, Patient/Family education, Joint mobilization, Stair training, DME instructions, Dry Needling, Electrical stimulation, Cryotherapy, vasopneumatic device, Moist heat, Taping, Traction Ultrasound, Ionotophoresis 4mg /ml Dexamethasone, and aquatic therapy, Manual therapy.  All included unless contraindicated  PLAN FOR NEXT SESSION: discharge    Next MD Visit: 06/26/23 routed note to NP on 06/23/23  PHYSICAL THERAPY DISCHARGE SUMMARY  Visits from Start of Care: 5  Current functional level related to goals / functional outcomes: See above   Remaining deficits: See above   Education / Equipment: HEP updated    Patient agrees to discharge. Patient goals were met. Patient is being discharged due to meeting the stated rehab goals.  Sharmon Leyden, PT,  MPT 07/22/2023, 10:12 AM

## 2023-09-23 ENCOUNTER — Encounter: Payer: Self-pay | Admitting: Pharmacist

## 2023-09-23 NOTE — Progress Notes (Signed)
Pharmacy Quality Measure Review  This patient is appearing on a report for being at risk of failing the adherence measure for cholesterol (statin) and hypertension (ACEi/ARB) medications this calendar year.   Medication: Atorvastatin 10 mg and Losartan 50 mg Last fill date: 09/17/2023 for 90 day supply (for both medications)  Insurance report was not up to date. No action needed at this time.   Arbutus Leas, PharmD, BCPS [facility] [phone]

## 2023-11-04 ENCOUNTER — Encounter: Payer: Self-pay | Admitting: Internal Medicine

## 2023-11-04 ENCOUNTER — Ambulatory Visit (INDEPENDENT_AMBULATORY_CARE_PROVIDER_SITE_OTHER): Payer: Medicare Other | Admitting: Internal Medicine

## 2023-11-04 VITALS — BP 118/74 | HR 74 | Temp 98.9°F | Ht 60.0 in | Wt 153.0 lb

## 2023-11-04 DIAGNOSIS — E1165 Type 2 diabetes mellitus with hyperglycemia: Secondary | ICD-10-CM | POA: Diagnosis not present

## 2023-11-04 DIAGNOSIS — F32A Depression, unspecified: Secondary | ICD-10-CM | POA: Diagnosis not present

## 2023-11-04 DIAGNOSIS — I10 Essential (primary) hypertension: Secondary | ICD-10-CM

## 2023-11-04 DIAGNOSIS — L509 Urticaria, unspecified: Secondary | ICD-10-CM

## 2023-11-04 LAB — BASIC METABOLIC PANEL
BUN: 19 mg/dL (ref 6–23)
CO2: 28 meq/L (ref 19–32)
Calcium: 9.6 mg/dL (ref 8.4–10.5)
Chloride: 104 meq/L (ref 96–112)
Creatinine, Ser: 0.9 mg/dL (ref 0.40–1.20)
GFR: 61.81 mL/min (ref >60.00)
Glucose, Bld: 95 mg/dL (ref 70–99)
Potassium: 4.4 meq/L (ref 3.5–5.1)
Sodium: 141 meq/L (ref 135–145)

## 2023-11-04 LAB — HEPATIC FUNCTION PANEL
ALT: 21 U/L (ref 0–35)
AST: 22 U/L (ref 0–37)
Albumin: 4.3 g/dL (ref 3.5–5.2)
Alkaline Phosphatase: 77 U/L (ref 39–117)
Bilirubin, Direct: 0.1 mg/dL (ref 0.0–0.3)
Total Bilirubin: 0.4 mg/dL (ref 0.2–1.2)
Total Protein: 7.2 g/dL (ref 6.0–8.3)

## 2023-11-04 LAB — LIPID PANEL
Cholesterol: 129 mg/dL (ref 0–200)
HDL: 41.8 mg/dL (ref >39.00)
LDL Cholesterol: 58 mg/dL (ref 0–99)
NonHDL: 87.23
Total CHOL/HDL Ratio: 3
Triglycerides: 147 mg/dL (ref 0.0–149.0)
VLDL: 29.4 mg/dL (ref 0.0–40.0)

## 2023-11-04 LAB — HEMOGLOBIN A1C: Hgb A1c MFr Bld: 6.5 % (ref 4.6–6.5)

## 2023-11-04 MED ORDER — PREDNISONE 10 MG PO TABS: ORAL_TABLET | ORAL | 0 refills | Status: AC

## 2023-11-04 MED ORDER — VENLAFAXINE HCL ER 150 MG PO CP24: 150.0000 mg | ORAL_CAPSULE | Freq: Every day | ORAL | 3 refills | Status: DC

## 2023-11-04 MED ORDER — METHYLPREDNISOLONE ACETATE 80 MG/ML IJ SUSP
80.0000 mg | Freq: Once | INTRAMUSCULAR | Status: AC
  Administered 2023-11-04: 80 mg via INTRAMUSCULAR

## 2023-11-04 NOTE — Progress Notes (Signed)
The test results show that your current treatment is OK, as the tests are stable.  Please continue the same plan.  There is no other need for change of treatment or further evaluation based on these results, at this time.  thanks 

## 2023-11-04 NOTE — Patient Instructions (Addendum)
You had the steroid shot today  Please take all new medication as prescribed  - the prednisone  Ok to increase the venlefaxine to 150 mg per day  Please continue all other medications as before, and refills have been done if requested.  Please have the pharmacy call with any other refills you may need.  Please continue your efforts at being more active, low cholesterol diet, and weight control.  Please keep your appointments with your specialists as you may have planned  Please go to the LAB at the blood drawing area for the tests to be done  You will be contacted by phone if any changes need to be made immediately.  Otherwise, you will receive a letter about your results with an explanation, but please check with MyChart first.  Please make an Appointment to return in 6 months, or sooner if needed

## 2023-11-04 NOTE — Progress Notes (Unsigned)
Patient ID: Kristen Howe, female   DOB: 1946/12/09, 77 y.o.   MRN: [id number]        Chief Complaint: follow up hives, dm, depression, htn       HPI:  Kristen Howe is a 77 y.o. female here with recent hive like rash to extremities and upper torso for 4 days with itching.  No swelling and Pt denies chest pain, increased sob or doe, wheezing, orthopnea, PND, increased LE swelling, palpitations, dizziness or syncope.  Has had mild worsening depressive symptoms, but no suicidal ideation, or panic; has ongoing anxiety as well.   Pt denies polydipsia, polyuria, or new focal neuro s/s.  Pt denies fever, wt loss, night sweats, loss of appetite, or other constitutional symptoms    Wt Readings from Last 3 Encounters:  11/04/23 153 lb (69.4 kg)  05/06/23 148 lb (67.1 kg)  11/13/22 144 lb (65.3 kg)   BP Readings from Last 3 Encounters:  11/04/23 118/74  05/06/23 126/82  11/13/22 118/66         Past Medical History:  Diagnosis Date   Allergy    Anemia    as a child   Anxiety    Arthritis    "lower back" (01/25/2016)   Bilateral breast cancer (HCC)    Hattie Perch 01/25/2016   Cataract    Depression    Esophageal diverticulum    GERD (gastroesophageal reflux disease)    Hyperlipidemia    Hypertension    Leg cramps    takes Flexeril   Migraine    "maybe monthly" (01/25/2016)   Osteoporosis    Pneumonia ~ 2005 X 1   Primary cancer of 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 breast (HCC) 12/14/2015   Rheumatic fever 1950s   Past Surgical History:  Procedure Laterality Date   BREAST BIOPSY Bilateral 2016   CATARACT EXTRACTION W/ INTRAOCULAR LENS  IMPLANT, BILATERAL Bilateral 05/2015-06/2015   CESAREAN SECTION  1974; 1976   COLONOSCOPY     ESOPHAGEAL MANOMETRY N/A 03/10/2017   Procedure: ESOPHAGEAL MANOMETRY (EM);  Surgeon: Meryl Dare, MD;  Location: [facility];  Service: Endoscopy;  Laterality: N/A;   EYE MUSCLE SURGERY Bilateral 1950s   for cross eyes as a child   INSERTION OF MESH N/A  09/12/2015   Procedure: INSERTION OF MESH;  Surgeon: Harriette Bouillon, MD;  Location: [facility] OR;  Service: General;  Laterality: N/A;   MASTECTOMY COMPLETE / SIMPLE W/ SENTINEL NODE BIOPSY Right 01/25/2016   MASTECTOMY MODIFIED RADICAL Left 01/25/2016   MASTECTOMY W/ SENTINEL NODE BIOPSY Bilateral 01/25/2016   Procedure: RIGHT MASTECTOMY WITH SENTINEL RIGHT LYMPH NODE BIOPSY, LEFT MODIFIED RADICAL MASTECTOMY;  Surgeon: Almond Lint, MD;  Location: [facility] OR;  Service: General;  Laterality: Bilateral;   TONSILLECTOMY  1950s   UMBILICAL HERNIA REPAIR N/A 09/12/2015   Procedure: REPAIR OF INCISIONAL AND UMBILICAL HERNIA;  Surgeon: Harriette Bouillon, MD;  Location: [facility] OR;  Service: General;  Laterality: N/A;    reports that she quit smoking about 7 years ago. Her smoking use included cigarettes. She started smoking about 7 years ago. She has never used smokeless tobacco. She reports that she does not drink alcohol and does not use drugs. family history includes Breast cancer in her cousin, cousin, cousin, cousin, and sister; Cancer in her cousin; Cancer (age of onset: 65) in her maternal grandmother; Diabetes in her father, paternal aunt, and paternal uncle; Gout in her paternal grandfather and paternal grandmother; Healthy in her mother; Heart Problems in her father; Lung cancer  in her cousin and maternal uncle; Lupus in her cousin; Lymphoma in her father; Other in her father and mother; Stroke in her maternal grandfather. Allergies  Allergen Reactions   Multihance [Gadobenate] Itching, Rash and Other (See Comments)    Pt developed immediate itching, red eyes and rash after MR contrast. Subsided with 25 mg of benadryl. Pt will need 13 hr prep prior to future studies. 02/27/21   Current Outpatient Medications on File Prior to Visit  Medication Sig Dispense Refill   albuterol (VENTOLIN HFA) 108 (90 Base) MCG/ACT inhaler Inhale 2 puffs into the lungs every 6 (six) hours as needed for wheezing or shortness of breath. 18 g 11    Artificial Tear Ointment (DRY EYES OP) Apply 1 drop to eye daily as needed (for dry eyes).      atorvastatin (LIPITOR) 10 MG tablet Take 1 tablet (10 mg total) by mouth daily. 90 tablet 3   Blood Glucose Calibration (ONETOUCH VERIO) High SOLN      Blood Glucose Monitoring Suppl (ONETOUCH VERIO REFLECT) w/Device KIT      Calcium-Phosphorus-Vitamin D (CITRACAL +D3 PO) Take 2 tablets by mouth 2 (two) times daily.      cyclobenzaprine (FLEXERIL) 5 MG tablet Take 1 tablet (5 mg total) by mouth 3 (three) times daily as needed. 60 tablet 5   fluticasone-salmeterol (ADVAIR) 250-50 MCG/ACT AEPB INHALE 1 DOSE BY MOUTH IN THE MORNING AND AT BEDTIME 180 each 3   glucose blood (ACCU-CHEK GUIDE) test strip 1 each by Other route daily. use as directed 100 each 1   ibuprofen (ADVIL) 800 MG tablet Take 800 mg by mouth every 6 (six) hours as needed.     Lancet Devices (SIMPLE DIAGNOSTICS LANCING DEV) MISC Apply topically.     losartan (COZAAR) 50 MG tablet Take 1 tablet (50 mg total) by mouth daily. 90 tablet 3   meloxicam (MOBIC) 15 MG tablet TAKE 1 TABLET(15 MG) BY MOUTH DAILY 30 tablet 0   Multiple Vitamins-Minerals (MULTIVITAMIN WITH MINERALS) tablet Take 1 tablet by mouth daily. Reported on 12/20/2015     ondansetron (ZOFRAN-ODT) 4 MG disintegrating tablet Take 1 tablet (4 mg total) by mouth every 8 (eight) hours as needed for nausea or vomiting. 30 tablet 0   pantoprazole (PROTONIX) 40 MG tablet 1 tab by mouth twice per day 180 tablet 3   Pharmacist Choice Lancets MISC      PREVNAR 20 0.5 ML injection      traMADol (ULTRAM) 50 MG tablet Take 1 tablet (50 mg total) by mouth every 6 (six) hours as needed. 30 tablet 0   triamcinolone cream (KENALOG) 0.1 % Apply 1 Application topically 2 (two) times daily. 453.6 g 1   No current facility-administered medications on file prior to visit.        ROS:  All others reviewed and negative.  Objective        PE:  BP 118/74 (BP Location: Right Arm, Patient  Position: Sitting, Cuff Size: Normal)   Pulse 74   Temp 98.9 F (37.2 C) (Oral)   Ht 5' (1.524 m)   Wt 153 lb (69.4 kg)   SpO2 94%   BMI 29.88 kg/m                 Constitutional: Pt appears in NAD               HENT: Head: NCAT.                Right  Ear: External ear normal.                 Left Ear: External ear normal.                Eyes: . Pupils are equal, round, and reactive to light. Conjunctivae and EOM are normal               Nose: without d/c or deformity               Neck: Neck supple. Gross normal ROM               Cardiovascular: Normal rate and regular rhythm.                 Pulmonary/Chest: Effort normal and breath sounds without rales or wheezing.                Abd:  Soft, NT, ND, + BS, no organomegaly               Neurological: Pt is alert. At baseline orientation, motor grossly intact               Skin: Skin is warm. LE edema - none; has hive like non tender wheal and flare many to extremities and upper torso               Psychiatric: Pt behavior is normal without agitation   Micro: none  Cardiac tracings I have personally interpreted today:  none  Pertinent Radiological findings (summarize): none   Lab Results  Component Value Date   WBC 7.3 05/06/2023   HGB 13.4 05/06/2023   HCT 41.3 05/06/2023   PLT 247.0 05/06/2023   GLUCOSE 95 11/04/2023   CHOL 129 11/04/2023   TRIG 147.0 11/04/2023   HDL 41.80 11/04/2023   LDLDIRECT 137.0 06/17/2018   LDLCALC 58 11/04/2023   ALT 21 11/04/2023   AST 22 11/04/2023   NA 141 11/04/2023   K 4.4 11/04/2023   CL 104 11/04/2023   CREATININE 0.90 11/04/2023   BUN 19 11/04/2023   CO2 28 11/04/2023   TSH 3.15 05/06/2023   HGBA1C 6.5 11/04/2023   MICROALBUR 2.3 (H) 05/06/2023   Assessment/Plan:  Kristen Howe is a 77 y.o. White or Caucasian [1] female with  has a past medical history of Allergy, Anemia, Anxiety, Arthritis, Bilateral breast cancer (HCC), Cataract, Depression, Esophageal diverticulum, GERD  (gastroesophageal reflux disease), Hyperlipidemia, Hypertension, Leg cramps, Migraine, Osteoporosis, Pneumonia (~ 2005 X 1), Primary cancer of 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 breast (HCC) (12/14/2015), and Rheumatic fever (1950s).  Hives Mild to mod, for depomedrol 80 mg IM, prednisone taper, zyrtec 10 every day prn otc,  to f/u any worsening symptoms or concerns  Essential hypertension BP Readings from Last 3 Encounters:  11/04/23 118/74  05/06/23 126/82  11/13/22 118/66   Stable, pt to continue medical treatment losartan 50 qd   Diabetes mellitus (HCC) Lab Results  Component Value Date   HGBA1C 6.5 11/04/2023   Stable, pt to continue current medical treatment  - diet, wt control   Depression With mild recent worsening, no SI or HI, for increased venlefaxine 150 every day, declines counseling referral  Followup: Return in about 6 months (around 05/03/2024).  Oliver Barre, MD 11/06/2023 8:07 PM [facility]

## 2023-11-06 ENCOUNTER — Encounter: Payer: Self-pay | Admitting: Internal Medicine

## 2023-11-06 DIAGNOSIS — F32A Depression, unspecified: Secondary | ICD-10-CM | POA: Insufficient documentation

## 2023-11-06 NOTE — Assessment & Plan Note (Addendum)
With mild recent worsening, no SI or HI, for increased venlefaxine 150 every day, declines counseling referral

## 2023-11-06 NOTE — Assessment & Plan Note (Signed)
Lab Results  Component Value Date   HGBA1C 6.5 11/04/2023   Stable, pt to continue current medical treatment  - diet, wt control

## 2023-11-06 NOTE — Assessment & Plan Note (Signed)
BP Readings from Last 3 Encounters:  11/04/23 118/74  05/06/23 126/82  11/13/22 118/66   Stable, pt to continue medical treatment losartan 50 qd

## 2023-11-06 NOTE — Assessment & Plan Note (Signed)
Mild to mod, for depomedrol 80 mg IM, prednisone taper, zyrtec 10 every day prn otc,  to f/u any worsening symptoms or concerns

## 2024-01-21 ENCOUNTER — Ambulatory Visit: Payer: Self-pay | Admitting: Internal Medicine

## 2024-01-21 NOTE — Telephone Encounter (Signed)
 Reason for Disposition  Urinating more frequently than usual (i.e., frequency)  Answer Assessment - Initial Assessment Questions 1. SYMPTOM: What's the main symptom you're concerned about? (e.g., frequency, incontinence)     Pressure when urination, urgency, small amounts of urine leaking  2. ONSET: When did the  symptoms start?     2 days ago  3. PAIN: Is there any pain? If Yes, ask: How bad is it? (Scale: 1-10; mild, moderate, severe)     Not painful, but there is pressure  4. CAUSE: What do you think is causing the symptoms?     UTI  5. OTHER SYMPTOMS: Do you have any other symptoms? (e.g., blood in urine, fever, flank pain, pain with urination)     Patient stated she has back pain, but she already has a prior hx of that  Protocols used: Urinary Symptoms-A-AH   Chief Complaint: UTI Symptoms: Pressure when urinating, urgency, small amounts of urine leaking Pertinent Negatives: Patient denies fever, blood in urine Disposition: [] ED /[] Urgent Care (no appt availability in office) / [x] Appointment(In office/virtual)/ []  [facility]/ [] Home Care/ [] Refused Recommended Disposition /[] [facility]/ []  Follow-up with PCP Additional Notes: Patient stated she is having urinary symptoms that started 2 days ago. She believes that she has a UTI. She denies painful urination, but stated that there is some pressure. Appointment scheduled for tomorrw

## 2024-01-22 ENCOUNTER — Ambulatory Visit (INDEPENDENT_AMBULATORY_CARE_PROVIDER_SITE_OTHER): Payer: Medicare Other | Admitting: Emergency Medicine

## 2024-01-22 ENCOUNTER — Encounter: Payer: Self-pay | Admitting: Emergency Medicine

## 2024-01-22 VITALS — BP 128/70 | HR 80 | Temp 98.9°F | Ht 60.0 in | Wt 155.0 lb

## 2024-01-22 DIAGNOSIS — R300 Dysuria: Secondary | ICD-10-CM

## 2024-01-22 DIAGNOSIS — N390 Urinary tract infection, site not specified: Secondary | ICD-10-CM

## 2024-01-22 LAB — POCT URINALYSIS DIP (MANUAL ENTRY)
Bilirubin, UA: NEGATIVE
Glucose, UA: NEGATIVE mg/dL
Leukocytes, UA: NEGATIVE
Nitrite, UA: NEGATIVE
Protein Ur, POC: 100 mg/dL — AB
Spec Grav, UA: >=1.03 — AB (ref 1.010–1.025)
Urobilinogen, UA: 0.2 U/dL
pH, UA: 6 (ref 5.0–8.0)

## 2024-01-22 MED ORDER — CEFUROXIME AXETIL 250 MG PO TABS: 250.0000 mg | ORAL_TABLET | Freq: Two times a day (BID) | ORAL | 0 refills | Status: AC

## 2024-01-22 NOTE — Assessment & Plan Note (Signed)
 Most likely secondary to UTI. History of likes in the past Urine sent for culture In the meantime recommend to start Ceftin  250 mg twice a day for 7 days May continue using Azo as needed Advised to stay well-hydrated and follow-up with PCP as needed

## 2024-01-22 NOTE — Assessment & Plan Note (Signed)
 Abnormal urinalysis.  No signs or symptoms of pyelonephritis Clinically stable.  No red flag signs or symptoms Recommend to start Ceftin  250 mg twice a day for 7 days pending urine culture ED precautions given Advised to stay well-hydrated and contact the office if no better or worse during the next several days

## 2024-01-22 NOTE — Patient Instructions (Signed)
 Dysuria Dysuria is pain or discomfort when you pee. The pain may be felt in your urethra, which is the part of your body that drains pee (urine) from your bladder. The pain may also be felt near your genitals, groin, or in your lower belly or back. You may have to pee often or have the sudden feeling that you need to pee. This condition can affect anyone, but it's more common in females. It can be caused by: A urinary tract infection (UTI). Kidney stones or bladder stones. Some sexually transmitted infections (STIs). Dehydration. This is when there's not enough water in your body. Irritation and swelling in the vagina. The use of some medicines. The use of some soaps or products with a scent. Follow these instructions at home: Medicines  Take your medicines only as told. Take your antibiotics as told. Do not stop taking them even if you start to feel better. Eating and drinking Drink enough fluid to keep your pee pale yellow. Certain drinks can make the pain worse. Avoid: Drinks with caffeine in them. Tea. Alcohol. In males, alcohol may irritate the prostate. General instructions Watch your condition for any changes, such as color changes in your pee. Pee often. Do not hold your pee for a long time. If you're female, wipe from front to back after you pee or poop. Use each tissue only once when you wipe. Pee after you have sex. If you've had any tests done, it's up to you to get your test results. Ask your health care provider, or the department doing the test, when your results will be ready. Contact a health care provider if: You have a fever or chills. You have pain in your back or sides. You throw up or feel like you may throw up. You have blood in your pee. You're not peeing as often as normal. You feel very weak. Get help right away if: You have very bad pain that doesn't get better with medicine. You're confused. You have a fast heartbeat while resting. This information is  not intended to replace advice given to you by your health care provider. Make sure you discuss any questions you have with your health care provider. Document Revised: 04/08/2023 Document Reviewed: 04/08/2023 Elsevier Patient Education  2024 [employer].

## 2024-01-22 NOTE — Progress Notes (Signed)
 Kristen Howe 78 y.o.   Chief Complaint  Patient presents with   Dysuria    Patient states started 3 days ago. She is having a lot of pressure, no burning. She was taking AZO helped a little but did not go away     HISTORY OF PRESENT ILLNESS: Acute problem visit today.  Patient of Dr. Lynwood Rush. This is a 78 y.o. female complaining of urinary frequency and occasional burning that started about 3 days ago Denies hematuria.  Denies nausea or vomiting.  Denies fever or chills.  Denies abdominal or pelvic pain.  Denies flank pain. Took over-the-counter Azo which helped a little.  No other associated symptoms. No other complaints or medical concerns today.  Dysuria  Associated symptoms include frequency. Pertinent negatives include no chills, flank pain, hematuria, nausea or vomiting.     Prior to Admission medications   Medication Sig Start Date End Date Taking? Authorizing Provider  albuterol  (VENTOLIN  HFA) 108 (90 Base) MCG/ACT inhaler Inhale 2 puffs into the lungs every 6 (six) hours as needed for wheezing or shortness of breath. 05/06/23  Yes Rush Lynwood ORN, MD  Artificial Tear Ointment (DRY EYES OP) Apply 1 drop to eye daily as needed (for dry eyes).    Yes [provider]  atorvastatin  (LIPITOR) 10 MG tablet Take 1 tablet (10 mg total) by mouth daily. 05/06/23  Yes Rush Lynwood ORN, MD  Blood Glucose Calibration (ONETOUCH VERIO) High SOLN  10/02/22  Yes [provider]  Blood Glucose Monitoring Suppl (ONETOUCH VERIO REFLECT) w/Device KIT  10/02/22  Yes [provider]  Calcium -Phosphorus-Vitamin D  (CITRACAL +D3 PO) Take 2 tablets by mouth 2 (two) times daily.    Yes [provider]  cyclobenzaprine  (FLEXERIL ) 5 MG tablet Take 1 tablet (5 mg total) by mouth 3 (three) times daily as needed. 05/06/23  Yes Rush Lynwood ORN, MD  fluticasone -salmeterol (ADVAIR) 250-50 MCG/ACT AEPB INHALE 1 DOSE BY MOUTH IN THE MORNING AND AT BEDTIME 05/28/22  Yes Rush Lynwood ORN,  MD  glucose blood (ACCU-CHEK GUIDE) test strip 1 each by Other route daily. use as directed 11/23/21  Yes Rush Lynwood ORN, MD  ibuprofen  (ADVIL ) 800 MG tablet Take 800 mg by mouth every 6 (six) hours as needed. 08/28/20  Yes [provider]  Lancet Devices (SIMPLE DIAGNOSTICS LANCING DEV) MISC Apply topically. 10/02/22  Yes [provider]  losartan  (COZAAR ) 50 MG tablet Take 1 tablet (50 mg total) by mouth daily. 05/06/23  Yes Rush Lynwood ORN, MD  meloxicam  (MOBIC ) 15 MG tablet TAKE 1 TABLET(15 MG) BY MOUTH DAILY 06/11/23  Yes Eldonna Novel, MD  Multiple Vitamins-Minerals (MULTIVITAMIN WITH MINERALS) tablet Take 1 tablet by mouth daily. Reported on 12/20/2015   Yes [provider]  ondansetron  (ZOFRAN -ODT) 4 MG disintegrating tablet Take 1 tablet (4 mg total) by mouth every 8 (eight) hours as needed for nausea or vomiting. 05/06/23  Yes Rush Lynwood ORN, MD  pantoprazole  (PROTONIX ) 40 MG tablet 1 tab by mouth twice per day 05/06/23  Yes Rush Lynwood ORN, MD  Pharmacist Choice Lancets MISC  09/04/23  Yes [provider]  predniSONE  (DELTASONE ) 10 MG tablet 3 tabs by mouth per day for 3 days,2tabs per day for 3 days,1tab per day for 3 days 11/04/23  Yes Rush Lynwood ORN, MD  PREVNAR 20 0.5 ML injection  06/01/22  Yes [provider]  traMADol  (ULTRAM ) 50 MG tablet Take 1 tablet (50 mg total) by mouth every 6 (six) hours as  needed. 05/06/23  Yes Norleen Lynwood ORN, MD  triamcinolone  cream (KENALOG ) 0.1 % Apply 1 Application topically 2 (two) times daily. 05/06/23 05/05/24 Yes Norleen Lynwood ORN, MD  venlafaxine  XR (EFFEXOR -XR) 150 MG 24 hr capsule Take 1 capsule (150 mg total) by mouth daily with breakfast. 11/04/23  Yes Norleen Lynwood ORN, MD    Allergies  Allergen Reactions   Multihance  [Gadobenate] Itching, Rash and Other (See Comments)    Pt developed immediate itching, red eyes and rash after MR contrast. Subsided with 25 mg of benadryl . Pt will need 13 hr prep prior to future studies.  02/27/21    Patient Active Problem List   Diagnosis Date Noted   Depression 11/06/2023   Hives 11/04/2023   GERD (gastroesophageal reflux disease) 05/06/2023   Dysuria 11/13/2022   Gross hematuria 11/13/2022   Rash 09/28/2022   Dyspnea 05/31/2022   Palpitations 05/31/2022   Leg cramps 05/31/2022   Weight loss 01/16/2022   Migraine 08/11/2021   Scalp laceration 05/31/2021   Allergic rhinitis 03/31/2021   Low back pain 03/31/2021   Aortic atherosclerosis (HCC) 02/05/2021   Acquired complex cyst of kidney 02/05/2021   Encounter for well adult exam with abnormal findings 02/05/2021   Insomnia 02/05/2021   RUQ pain 08/03/2020   History of colon polyps 08/03/2020   Cough variant asthma 07/07/2019   Cough 05/05/2019   Diabetes mellitus (HCC) 10/07/2017   Patellofemoral pain syndrome of both knees 10/03/2017   Anxiety 09/29/2017   Current moderate episode of major depressive disorder (HCC) 09/29/2017   Esophageal diverticulum 04/14/2017   Esophageal motility disorder 04/14/2017   Esophageal dysphagia    Hypertriglyceridemia 09/03/2016   Medicare annual wellness visit, subsequent 09/03/2016   Overweight (BMI 25.0-29.9) 09/03/2016   Osteoporosis 08/28/2016   Bilateral breast cancer (HCC) 01/25/2016   Former smoker 12/22/2015   Malignant neoplasm of upper-inner quadrant of left breast in female, estrogen receptor positive (HCC) 12/14/2015   Lump in female breast 11/20/2015   Umbilical hernia without obstruction and without gangrene 07/28/2015   Essential hypertension 07/28/2015   Seborrheic keratosis 07/28/2015    Past Medical History:  Diagnosis Date   Allergy    Anemia    as a child   Anxiety    Arthritis    lower back (01/25/2016)   Bilateral breast cancer (HCC)    thelbert 01/25/2016   Cataract    Depression    Esophageal diverticulum    GERD (gastroesophageal reflux disease)    Hyperlipidemia    Hypertension    Leg cramps    takes Flexeril    Migraine    maybe  monthly (01/25/2016)   Osteoporosis    Pneumonia ~ 2005 X 1   Primary cancer of 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 breast (HCC) 12/14/2015   Rheumatic fever 1950s    Past Surgical History:  Procedure Laterality Date   BREAST BIOPSY Bilateral 2016   CATARACT EXTRACTION W/ INTRAOCULAR LENS  IMPLANT, BILATERAL Bilateral 05/2015-06/2015   CESAREAN SECTION  1974; 1976   COLONOSCOPY     ESOPHAGEAL MANOMETRY N/A 03/10/2017   Procedure: ESOPHAGEAL MANOMETRY (EM);  Surgeon: Gwendlyn ONEIDA Buddy, MD;  Location: [facility];  Service: Endoscopy;  Laterality: N/A;   EYE MUSCLE SURGERY Bilateral 1950s   for cross eyes as a child   INSERTION OF MESH N/A 09/12/2015   Procedure: INSERTION OF MESH;  Surgeon: Debby Shipper, MD;  Location: [facility];  Service: General;  Laterality: N/A;   MASTECTOMY COMPLETE / SIMPLE W/ SENTINEL  NODE BIOPSY Right 01/25/2016   MASTECTOMY MODIFIED RADICAL Left 01/25/2016   MASTECTOMY W/ SENTINEL NODE BIOPSY Bilateral 01/25/2016   Procedure: RIGHT MASTECTOMY WITH SENTINEL RIGHT LYMPH NODE BIOPSY, LEFT MODIFIED RADICAL MASTECTOMY;  Surgeon: Jina Nephew, MD;  Location: [facility] OR;  Service: General;  Laterality: Bilateral;   TONSILLECTOMY  1950s   UMBILICAL HERNIA REPAIR N/A 09/12/2015   Procedure: REPAIR OF INCISIONAL AND UMBILICAL HERNIA;  Surgeon: Debby Shipper, MD;  Location: [facility] OR;  Service: General;  Laterality: N/A;    Social History   Socioeconomic History   Marital status: Divorced    Spouse name: Not on file   Number of children: 2   Years of education: 12   Highest education level: Not on file  Occupational History   Occupation: retired  Tobacco Use   Smoking status: Former    Current packs/day: 0.00    Types: Cigarettes    Start date: 12/24/2015    Quit date: 12/24/2015    Years since quitting: 8.0   Smokeless tobacco: Never  Vaping Use   Vaping status: Never Used  Substance and Sexual Activity   Alcohol use: No    Alcohol/week: 0.0 standard drinks of alcohol     Comment: 07/31/16 pt states she dosn't drink anymore,04/16/2016 1/2 bottle wine per month previously - not much now   Drug use: No   Sexual activity: Never  Other Topics Concern   Not on file  Social History Narrative   Lives at home with sister and nephews.   Fun: Internet; games, talk to people, video games   Denies religious beliefs effecting health care.   Denies abuse and feels safe at home.    Social Drivers of Corporate Investment Banker Strain: Low Risk  (07/05/2022)   Overall Financial Resource Strain (CARDIA)    Difficulty of Paying Living Expenses: Not hard at all  Food Insecurity: No Food Insecurity (07/05/2022)   Hunger Vital Sign    Worried About Running Out of Food in the Last Year: Never true    Ran Out of Food in the Last Year: Never true  Transportation Needs: No Transportation Needs (07/05/2022)   PRAPARE - Administrator, Civil Service (Medical): No    Lack of Transportation (Non-Medical): No  Physical Activity: Sufficiently Active (07/05/2022)   Exercise Vital Sign    Days of Exercise per Week: 5 days    Minutes of Exercise per Session: 30 min  Stress: No Stress Concern Present (07/05/2022)   [employer] of Occupational Health - Occupational Stress Questionnaire    Feeling of Stress : Not at all  Social Connections: Socially Isolated (07/05/2022)   Social Connection and Isolation Panel [NHANES]    Frequency of Communication with Friends and Family: More than three times a week    Frequency of Social Gatherings with Friends and Family: Once a week    Attends Religious Services: Never    Database Administrator or Organizations: No    Attends Banker Meetings: Never    Marital Status: Divorced  Catering Manager Violence: Not At Risk (07/05/2022)   Humiliation, Afraid, Rape, and Kick questionnaire    Fear of Current or Ex-Partner: No    Emotionally Abused: No    Physically Abused: No    Sexually Abused: No    Family History   Problem Relation Age of Onset   Healthy Mother    Other Mother        +throat polyps; +smoker  Lymphoma Father        d. 82   Diabetes Father    Heart Problems Father    Other Father        tumors on his kidneys   Breast cancer Sister        dx. metachronous bilateral breast cancers at 58 and 38; - BRCA1/2 testing   Cancer Maternal Grandmother 42       dx. spinal cancer   Stroke Maternal Grandfather    Gout Paternal Grandmother    Gout Paternal Grandfather    Lung cancer Maternal Uncle        d. 34-72; +smoker   Diabetes Paternal Aunt    Diabetes Paternal Uncle    Breast cancer Cousin        maternal 1st cousin dx. unspecified age   Lung cancer Cousin        maternal 1st cousin dx. lung cancer; +smoker   Lupus Cousin    Breast cancer Cousin        paternal 1st cousin dx. 30s   Breast cancer Cousin        paternal 1st cousin dx. 49s-30s   Cancer Cousin        paternal 1st cousin dx. oral cancer with metastasis, also lost a leg to cancer; dx. 33s; +EtOH abuse   Breast cancer Cousin        paternal 1st cousin, once-removed dx. at unspecified age   Colon cancer Neg Hx    Esophageal cancer Neg Hx    Rectal cancer Neg Hx    Stomach cancer Neg Hx      Review of Systems  Constitutional: Negative.  Negative for chills and fever.  HENT: Negative.  Negative for congestion and sore throat.   Respiratory: Negative.  Negative for cough and shortness of breath.   Cardiovascular: Negative.  Negative for palpitations.  Gastrointestinal:  Negative for abdominal pain, nausea and vomiting.  Genitourinary:  Positive for dysuria and frequency. Negative for flank pain and hematuria.  Skin: Negative.  Negative for rash.  Neurological: Negative.  Negative for dizziness and headaches.  All other systems reviewed and are negative.   Vitals:   01/22/24 0932  BP: 128/70  Pulse: 80  Temp: 98.9 F (37.2 C)  SpO2: 94%    Physical Exam Vitals reviewed.  Constitutional:       Appearance: Normal appearance.  HENT:     Head: Normocephalic.     Mouth/Throat:     Mouth: Mucous membranes are moist.     Pharynx: Oropharynx is clear.  Cardiovascular:     Rate and Rhythm: Normal rate and regular rhythm.     Pulses: Normal pulses.     Heart sounds: Normal heart sounds.  Pulmonary:     Effort: Pulmonary effort is normal.     Breath sounds: Normal breath sounds.  Abdominal:     Palpations: Abdomen is soft.     Tenderness: There is no abdominal tenderness. There is no right CVA tenderness or left CVA tenderness.  Skin:    General: Skin is warm and dry.     Capillary Refill: Capillary refill takes less than 2 seconds.  Neurological:     General: No focal deficit present.     Mental Status: She is alert and oriented to person, place, and time.  Psychiatric:        Mood and Affect: Mood normal.        Behavior: Behavior normal.    Results for orders  placed or performed in visit on 01/22/24 (from the past 24 hours)  POCT urinalysis dipstick     Status: Abnormal   Collection Time: 01/22/24  9:58 AM  Result Value Ref Range   Color, UA yellow yellow   Clarity, UA clear clear   Glucose, UA negative negative mg/dL   Bilirubin, UA negative negative   Ketones, POC UA large (80) (A) negative mg/dL   Spec Grav, UA >=8.969 (A) 1.010 - 1.025   Blood, UA moderate (A) negative   pH, UA 6.0 5.0 - 8.0   Protein Ur, POC =100 (A) negative mg/dL   Urobilinogen, UA 0.2 0.2 or 1.0 E.U./dL   Nitrite, UA Negative Negative   Leukocytes, UA Negative Negative     ASSESSMENT & PLAN: A total of 32 minutes was spent with the patient and counseling/coordination of care regarding preparing for this visit, review of most recent office visit notes, review of chronic medical conditions under management, review of all medications, diagnosis of possible UTI and need for antibiotics, prognosis, ED precautions, documentation, and need for follow-up if no better or worse during the next several  days.  Problem List Items Addressed This Visit       Genitourinary   Acute UTI   Abnormal urinalysis.  No signs or symptoms of pyelonephritis Clinically stable.  No red flag signs or symptoms Recommend to start Ceftin  250 mg twice a day for 7 days pending urine culture ED precautions given Advised to stay well-hydrated and contact the office if no better or worse during the next several days      Relevant Medications   cefUROXime  (CEFTIN ) 250 MG tablet     Other   Dysuria - Primary   Most likely secondary to UTI. History of likes in the past Urine sent for culture In the meantime recommend to start Ceftin  250 mg twice a day for 7 days May continue using Azo as needed Advised to stay well-hydrated and follow-up with PCP as needed      Relevant Medications   cefUROXime  (CEFTIN ) 250 MG tablet   Other Relevant Orders   Urine Culture   POCT urinalysis dipstick (Completed)   Patient Instructions  Dysuria Dysuria is pain or discomfort when you pee. The pain may be felt in your urethra, which is the part of your body that drains pee (urine) from your bladder. The pain may also be felt near your genitals, groin, or in your lower belly or back. You may have to pee often or have the sudden feeling that you need to pee. This condition can affect anyone, but it's more common in females. It can be caused by: A urinary tract infection (UTI). Kidney stones or bladder stones. Some sexually transmitted infections (STIs). Dehydration. This is when there's not enough water  in your body. Irritation and swelling in the vagina. The use of some medicines. The use of some soaps or products with a scent. Follow these instructions at home: Medicines  Take your medicines only as told. Take your antibiotics as told. Do not stop taking them even if you start to feel better. Eating and drinking Drink enough fluid to keep your pee pale yellow. Certain drinks can make the pain worse. Avoid: Drinks  with caffeine in them. Tea. Alcohol. In males, alcohol may irritate the prostate. General instructions Watch your condition for any changes, such as color changes in your pee. Pee often. Do not hold your pee for a long time. If you're female, wipe from front  to back after you pee or poop. Use each tissue only once when you wipe. Pee after you have sex. If you've had any tests done, it's up to you to get your test results. Ask your health care provider, or the department doing the test, when your results will be ready. Contact a health care provider if: You have a fever or chills. You have pain in your back or sides. You throw up or feel like you may throw up. You have blood in your pee. You're not peeing as often as normal. You feel very weak. Get help right away if: You have very bad pain that doesn't get better with medicine. You're confused. You have a fast heartbeat while resting. This information is not intended to replace advice given to you by your health care provider. Make sure you discuss any questions you have with your health care provider. Document Revised: 04/08/2023 Document Reviewed: 04/08/2023 Elsevier Patient Education  2024 Elsevier Inc[employer]    Emil Schaumann, MD [facility]

## 2024-01-23 ENCOUNTER — Ambulatory Visit (INDEPENDENT_AMBULATORY_CARE_PROVIDER_SITE_OTHER): Payer: Medicare Other

## 2024-01-23 DIAGNOSIS — Z0000 Encounter for general adult medical examination without abnormal findings: Secondary | ICD-10-CM | POA: Diagnosis not present

## 2024-01-23 LAB — URINE CULTURE

## 2024-01-23 NOTE — Patient Instructions (Signed)
 Ms. Kristen Howe , Thank you for taking time to come for your Medicare Wellness Visit. I appreciate your ongoing commitment to your health goals. Please review the following plan we discussed and let me know if I can assist you in the future.   Referrals/Orders/Follow-Ups/Clinician Recommendations: none  This is a list of the screening recommended for you and due dates:  Health Maintenance  Topic Date Due   Colon Cancer Screening  11/03/2024*   Eye exam for diabetics  03/16/2024   Hemoglobin A1C  05/03/2024   Yearly kidney health urinalysis for diabetes  05/05/2024   Yearly kidney function blood test for diabetes  11/03/2024   Complete foot exam   11/03/2024   Medicare Annual Wellness Visit  01/22/2025   DTaP/Tdap/Td vaccine (2 - Td or Tdap) 07/06/2029   Pneumonia Vaccine  Completed   DEXA scan (bone density measurement)  Completed   Hepatitis C Screening  Completed   HPV Vaccine  Aged Out   Flu Shot  Discontinued   COVID-19 Vaccine  Discontinued   Zoster (Shingles) Vaccine  Discontinued  *Topic was postponed. The date shown is not the original due date.    Advanced directives: (In Chart) A copy of your advanced directives are scanned into your chart should your provider ever need it.  Next Medicare Annual Wellness Visit scheduled for next year: Yes  insert Preventive Care attachment Insert FALL PREVENTION attachment if needed

## 2024-01-23 NOTE — Progress Notes (Signed)
 Subjective:   Kristen Howe is a 78 y.o. female who presents for Medicare Annual (Subsequent) preventive examination.  Visit Complete: Virtual I connected with  Kristen Howe on 01/23/24 by a audio enabled telemedicine application and verified that I am speaking with the correct person using two identifiers. Interactive audio and video telecommunications were attempted between this provider and patient, however failed, due to patient having technical difficulties OR patient did not have access to video capability.  We continued and completed visit with audio only.  Patient Location: Home  Provider Location: Home Office  I discussed the limitations of evaluation and management by telemedicine. The patient expressed understanding and agreed to proceed.  Vital Signs: Because this visit was a virtual/telehealth visit, some criteria may be missing or patient reported. Any vitals not documented were not able to be obtained and vitals that have been documented are patient reported.    Cardiac Risk Factors include: advanced age (>54men, >77 women);diabetes mellitus;hypertension     Objective:    Today's Vitals   01/23/24 0836  PainSc: 3    There is no height or weight on file to calculate BMI.     01/23/2024    8:46 AM 05/27/2023   10:24 AM 07/05/2022   11:31 AM 07/11/2021   11:28 AM 05/24/2021    1:20 PM 08/19/2019    8:28 AM 11/13/2017   10:21 AM  Advanced Directives  Does Patient Have a Medical Advance Directive? Yes No No No No No No  Type of Estate Agent of [place];Living will        Copy of Healthcare Power of Attorney in Chart? Yes - validated most recent copy scanned in chart (See row information)        Would patient like information on creating a medical advance directive?  No - Patient declined No - Patient declined Yes (MAU/Ambulatory/Procedural Areas - Information given)  Yes (MAU/Ambulatory/Procedural Areas - Information given) Yes (ED - Information  included in AVS)    Current Medications (verified) Outpatient Encounter Medications as of 01/23/2024  Medication Sig   albuterol  (VENTOLIN  HFA) 108 (90 Base) MCG/ACT inhaler Inhale 2 puffs into the lungs every 6 (six) hours as needed for wheezing or shortness of breath.   Artificial Tear Ointment (DRY EYES OP) Apply 1 drop to eye daily as needed (for dry eyes).    atorvastatin  (LIPITOR) 10 MG tablet Take 1 tablet (10 mg total) by mouth daily.   Blood Glucose Calibration (ONETOUCH VERIO) High SOLN    Blood Glucose Monitoring Suppl (ONETOUCH VERIO REFLECT) w/Device KIT    Calcium -Phosphorus-Vitamin D  (CITRACAL +D3 PO) Take 2 tablets by mouth 2 (two) times daily.    cefUROXime  (CEFTIN ) 250 MG tablet Take 1 tablet (250 mg total) by mouth 2 (two) times daily with a meal for 7 days.   cyclobenzaprine  (FLEXERIL ) 5 MG tablet Take 1 tablet (5 mg total) by mouth 3 (three) times daily as needed.   fluticasone -salmeterol (ADVAIR) 250-50 MCG/ACT AEPB INHALE 1 DOSE BY MOUTH IN THE MORNING AND AT BEDTIME   glucose blood (ACCU-CHEK GUIDE) test strip 1 each by Other route daily. use as directed   ibuprofen  (ADVIL ) 800 MG tablet Take 800 mg by mouth every 6 (six) hours as needed.   Lancet Devices (SIMPLE DIAGNOSTICS LANCING DEV) MISC Apply topically.   losartan  (COZAAR ) 50 MG tablet Take 1 tablet (50 mg total) by mouth daily.   meloxicam  (MOBIC ) 15 MG tablet TAKE 1 TABLET(15 MG) BY MOUTH DAILY  Multiple Vitamins-Minerals (MULTIVITAMIN WITH MINERALS) tablet Take 1 tablet by mouth daily. Reported on 12/20/2015   ondansetron  (ZOFRAN -ODT) 4 MG disintegrating tablet Take 1 tablet (4 mg total) by mouth every 8 (eight) hours as needed for nausea or vomiting.   pantoprazole  (PROTONIX ) 40 MG tablet 1 tab by mouth twice per day   Pharmacist Choice Lancets MISC    predniSONE  (DELTASONE ) 10 MG tablet 3 tabs by mouth per day for 3 days,2tabs per day for 3 days,1tab per day for 3 days   traMADol  (ULTRAM ) 50 MG tablet Take 1  tablet (50 mg total) by mouth every 6 (six) hours as needed.   triamcinolone  cream (KENALOG ) 0.1 % Apply 1 Application topically 2 (two) times daily.   venlafaxine  XR (EFFEXOR -XR) 150 MG 24 hr capsule Take 1 capsule (150 mg total) by mouth daily with breakfast.   PREVNAR 20 0.5 ML injection  (Patient not taking: Reported on 01/23/2024)   No facility-administered encounter medications on file as of 01/23/2024.    Allergies (verified) Multihance  [gadobenate]   History: Past Medical History:  Diagnosis Date   Allergy    Anemia    as a child   Anxiety    Arthritis    lower back (01/25/2016)   Bilateral breast cancer (HCC)    thelbert 01/25/2016   Cataract    Depression    Esophageal diverticulum    GERD (gastroesophageal reflux disease)    Hyperlipidemia    Hypertension    Leg cramps    takes Flexeril    Migraine    maybe monthly (01/25/2016)   Osteoporosis    Pneumonia ~ 2005 X 1   Primary cancer of 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 breast (HCC) 12/14/2015   Rheumatic fever 1950s   Past Surgical History:  Procedure Laterality Date   BREAST BIOPSY Bilateral 2016   CATARACT EXTRACTION W/ INTRAOCULAR LENS  IMPLANT, BILATERAL Bilateral 05/2015-06/2015   CESAREAN SECTION  1974; 1976   COLONOSCOPY     ESOPHAGEAL MANOMETRY N/A 03/10/2017   Procedure: ESOPHAGEAL MANOMETRY (EM);  Surgeon: Gwendlyn ONEIDA Buddy, MD;  Location: [facility];  Service: Endoscopy;  Laterality: N/A;   EYE MUSCLE SURGERY Bilateral 1950s   for cross eyes as a child   INSERTION OF MESH N/A 09/12/2015   Procedure: INSERTION OF MESH;  Surgeon: Debby Shipper, MD;  Location: [facility];  Service: General;  Laterality: N/A;   MASTECTOMY COMPLETE / SIMPLE W/ SENTINEL NODE BIOPSY Right 01/25/2016   MASTECTOMY MODIFIED RADICAL Left 01/25/2016   MASTECTOMY W/ SENTINEL NODE BIOPSY Bilateral 01/25/2016   Procedure: RIGHT MASTECTOMY WITH SENTINEL RIGHT LYMPH NODE BIOPSY, LEFT MODIFIED RADICAL MASTECTOMY;  Surgeon: Jina Nephew, MD;   Location: [facility] OR;  Service: General;  Laterality: Bilateral;   TONSILLECTOMY  1950s   UMBILICAL HERNIA REPAIR N/A 09/12/2015   Procedure: REPAIR OF INCISIONAL AND UMBILICAL HERNIA;  Surgeon: Debby Shipper, MD;  Location: [facility] OR;  Service: General;  Laterality: N/A;   Family History  Problem Relation Age of Onset   Healthy Mother    Other Mother        +throat polyps; +smoker   Lymphoma Father        d. 62   Diabetes Father    Heart Problems Father    Other Father        tumors on his kidneys   Breast cancer Sister        dx. metachronous bilateral breast cancers at 39 and 77; - BRCA1/2 testing   Cancer  Maternal Grandmother 21       dx. spinal cancer   Stroke Maternal Grandfather    Gout Paternal Grandmother    Gout Paternal Grandfather    Lung cancer Maternal Uncle        d. 1-72; +smoker   Diabetes Paternal Aunt    Diabetes Paternal Uncle    Breast cancer Cousin        maternal 1st cousin dx. unspecified age   Lung cancer Cousin        maternal 1st cousin dx. lung cancer; +smoker   Lupus Cousin    Breast cancer Cousin        paternal 1st cousin dx. 30s   Breast cancer Cousin        paternal 1st cousin dx. 52s-30s   Cancer Cousin        paternal 1st cousin dx. oral cancer with metastasis, also lost a leg to cancer; dx. 48s; +EtOH abuse   Breast cancer Cousin        paternal 1st cousin, once-removed dx. at unspecified age   Colon cancer Neg Hx    Esophageal cancer Neg Hx    Rectal cancer Neg Hx    Stomach cancer Neg Hx    Social History   Socioeconomic History   Marital status: Divorced    Spouse name: Not on file   Number of children: 2   Years of education: 12   Highest education level: Not on file  Occupational History   Occupation: retired  Tobacco Use   Smoking status: Former    Current packs/day: 0.00    Types: Cigarettes    Start date: 12/24/2015    Quit date: 12/24/2015    Years since quitting: 8.0   Smokeless tobacco: Never  Vaping Use   Vaping  status: Never Used  Substance and Sexual Activity   Alcohol use: No    Alcohol/week: 0.0 standard drinks of alcohol    Comment: 07/31/16 pt states she dosn't drink anymore,04/16/2016 1/2 bottle wine per month previously - not much now   Drug use: No   Sexual activity: Never  Other Topics Concern   Not on file  Social History Narrative   Lives at home with sister and nephews.   Fun: Internet; games, talk to people, video games   Denies religious beliefs effecting health care.   Denies abuse and feels safe at home.    Social Drivers of Corporate Investment Banker Strain: Low Risk  (01/23/2024)   Overall Financial Resource Strain (CARDIA)    Difficulty of Paying Living Expenses: Not hard at all  Food Insecurity: No Food Insecurity (01/23/2024)   Hunger Vital Sign    Worried About Running Out of Food in the Last Year: Never true    Ran Out of Food in the Last Year: Never true  Transportation Needs: No Transportation Needs (01/23/2024)   PRAPARE - Administrator, Civil Service (Medical): No    Lack of Transportation (Non-Medical): No  Physical Activity: Insufficiently Active (01/23/2024)   Exercise Vital Sign    Days of Exercise per Week: 4 days    Minutes of Exercise per Session: 30 min  Stress: No Stress Concern Present (01/23/2024)   [employer] of Occupational Health - Occupational Stress Questionnaire    Feeling of Stress : Only a little  Social Connections: Socially Isolated (01/23/2024)   Social Connection and Isolation Panel [NHANES]    Frequency of Communication with Friends and Family: Never  Frequency of Social Gatherings with Friends and Family: Never    Attends Religious Services: Never    Database Administrator or Organizations: No    Attends Engineer, Structural: Never    Marital Status: Divorced    Tobacco Counseling Counseling given: Not Answered   Clinical Intake:  Pre-visit preparation completed: Yes  Pain : 0-10 Pain Score: 3  Pain  Type: Chronic pain Pain Location: Leg Pain Orientation: Right Pain Descriptors / Indicators: Aching Pain Onset: More than a month ago Pain Frequency: Intermittent     Nutritional Risks: None Diabetes: Yes CBG done?: No Did pt. bring in CBG monitor from home?: No  How often do you need to have someone help you when you read instructions, pamphlets, or other written materials from your doctor or pharmacy?: 1 - Never  Interpreter Needed?: No  Information entered by :: NAllen LPN   Activities of Daily Living    01/23/2024    8:38 AM  In your present state of health, do you have any difficulty performing the following activities:  Hearing? 0  Vision? 0  Difficulty concentrating or making decisions? 1  Walking or climbing stairs? 1  Comment sometimes  Dressing or bathing? 0  Doing errands, shopping? 1  Comment sister goes with her  Preparing Food and eating ? N  Using the Toilet? N  In the past six months, have you accidently leaked urine? Y  Comment has an UTI  Do you have problems with loss of bowel control? N  Managing your Medications? N  Managing your Finances? N  Housekeeping or managing your Housekeeping? N    Patient Care Team: Norleen Lynwood ORN, MD as PCP - General (Internal Medicine) Magrinat, Sandria BROCKS, MD (Inactive) as Consulting Physician (Oncology) Dewey Norleen, MD as Consulting Physician (Radiation Oncology) Aron Shoulders, MD as Consulting Physician (General Surgery) [facility] as Consulting Physician (Ophthalmology) Harrietta Kurtz, OD as Consulting Physician (Optometry)  Indicate any recent Medical Services you may have received from other than [facility] providers in the past year (date may be approximate).     Assessment:   This is a routine wellness examination for Kristen Howe.  Hearing/Vision screen Hearing Screening - Comments:: Denies hearing issues Vision Screening - Comments:: Regular eye exams, [facility]   Goals Addressed              This Visit's Progress    Patient Stated       01/23/2024, stay healthy       Depression Screen    01/23/2024    8:47 AM 01/22/2024    9:34 AM 11/04/2023    1:33 PM 05/06/2023    1:24 PM 11/13/2022   10:06 AM 09/27/2022    9:07 AM 07/05/2022   11:22 AM  PHQ 2/9 Scores  PHQ - 2 Score 0 1 0 0 0 0 1  PHQ- 9 Score     0 0     Fall Risk    01/23/2024    8:46 AM 01/22/2024    9:34 AM 11/04/2023    1:33 PM 05/06/2023    1:23 PM 11/13/2022   10:06 AM  Fall Risk   Falls in the past year? 0 0 0 0 0  Number falls in past yr: 0 0 0 0   Injury with Fall? 0 0 0 0 0  Risk for fall due to : Medication side effect No Fall Risks No Fall Risks No Fall Risks No Fall Risks  Follow up Falls  prevention discussed;Falls evaluation completed Falls evaluation completed Falls evaluation completed Falls evaluation completed Falls evaluation completed    MEDICARE RISK AT HOME: Medicare Risk at Home Any stairs in or around the home?: Yes If so, are there any without handrails?: No Home free of loose throw rugs in walkways, pet beds, electrical cords, etc?: Yes Adequate lighting in your home to reduce risk of falls?: Yes Life alert?: No Use of a cane, walker or w/c?: No Grab bars in the bathroom?: Yes Shower chair or bench in shower?: No Elevated toilet seat or a handicapped toilet?: No  TIMED UP AND GO:  Was the test performed?  No    Cognitive Function:    11/13/2017   11:07 AM  MMSE - Mini Mental State Exam  Not completed: Refused        01/23/2024    8:47 AM 07/05/2022   11:36 AM  6CIT Screen  What Year? 0 points 0 points  What month? 0 points 0 points  What time? 0 points 0 points  Count back from 20 0 points 0 points  Months in reverse 2 points 0 points  Repeat phrase 0 points 0 points  Total Score 2 points 0 points    Immunizations Immunization History  Administered Date(s) Administered   Fluad Quad(high Dose 65+) 11/13/2022   Pneumococcal Conjugate-13 07/07/2019   Pneumococcal  Polysaccharide-23 07/11/2021   Tdap 07/07/2019   Zoster Recombinant(Shingrix) 05/03/2023    TDAP status: Up to date  Flu Vaccine status: Due, Education has been provided regarding the importance of this vaccine. Advised may receive this vaccine at local pharmacy or Health Dept. Aware to provide a copy of the vaccination record if obtained from local pharmacy or Health Dept. Verbalized acceptance and understanding.  Pneumococcal vaccine status: Up to date  Covid-19 vaccine status: Information provided on how to obtain vaccines.   Qualifies for Shingles Vaccine? Yes   Zostavax completed No   Shingrix Completed?: declines second dose  Screening Tests Health Maintenance  Topic Date Due   Colonoscopy  11/03/2024 (Originally 02/27/2020)   OPHTHALMOLOGY EXAM  03/16/2024   HEMOGLOBIN A1C  05/03/2024   Diabetic kidney evaluation - Urine ACR  05/05/2024   Diabetic kidney evaluation - eGFR measurement  11/03/2024   FOOT EXAM  11/03/2024   Medicare Annual Wellness (AWV)  01/22/2025   DTaP/Tdap/Td (2 - Td or Tdap) 07/06/2029   Pneumonia Vaccine 52+ Years old  Completed   DEXA SCAN  Completed   Hepatitis C Screening  Completed   HPV VACCINES  Aged Out   INFLUENZA VACCINE  Discontinued   COVID-19 Vaccine  Discontinued   Zoster Vaccines- Shingrix  Discontinued    Health Maintenance  There are no preventive care reminders to display for this patient.   Colorectal cancer screening: No longer required.   Mammogram status: No longer required due to age.  Bone Density status: Completed 07/28/2020.   Lung Cancer Screening: (Low Dose CT Chest recommended if Age 4-80 years, 20 pack-year currently smoking OR have quit w/in 15years.) does not qualify.   Lung Cancer Screening Referral: no  Additional Screening:  Hepatitis C Screening: does qualify; Completed 07/07/2019  Vision Screening: Recommended annual ophthalmology exams for early detection of glaucoma and other disorders of the  eye. Is the patient up to date with their annual eye exam?  Yes  Who is the provider or what is the name of the office in which the patient attends annual eye exams? [facility] If pt is not  established with a provider, would they like to be referred to a provider to establish care? No .   Dental Screening: Recommended annual dental exams for proper oral hygiene  Diabetic Foot Exam: Diabetic Foot Exam: Completed 11/04/2023  Community Resource Referral / Chronic Care Management: CRR required this visit?  No   CCM required this visit?  No     Plan:     I have personally reviewed and noted the following in the patient's chart:   Medical and social history Use of alcohol, tobacco or illicit drugs  Current medications and supplements including opioid prescriptions. Patient is not currently taking opioid prescriptions. Functional ability and status Nutritional status Physical activity Advanced directives List of other physicians Hospitalizations, surgeries, and ER visits in previous 12 months Vitals Screenings to include cognitive, depression, and falls Referrals and appointments  In addition, I have reviewed and discussed with patient certain preventive protocols, quality metrics, and best practice recommendations. A written personalized care plan for preventive services as well as general preventive health recommendations were provided to patient.     Ardella FORBES Dawn, LPN   06/16/7973   After Visit Summary: (Pick Up) Due to this being a telephonic visit, with patients personalized plan was offered to patient and patient has requested to Pick up at office.  Nurse Notes: none

## 2024-01-27 ENCOUNTER — Telehealth: Payer: Self-pay

## 2024-01-27 NOTE — Progress Notes (Signed)
Complex Care Management Note Care Guide Note  01/27/2024 Name: Kristen Howe MRN: [id number] DOB: 12-Apr-1946   Complex Care Management Outreach Attempts: An unsuccessful telephone outreach was attempted today to offer the patient information about available complex care management services.  Follow Up Plan:  Additional outreach attempts will be made to offer the patient complex care management information and services.   Encounter Outcome:  No Answer  Westley Blass [facility]  [facility] Guide Direct Dial: [phone]  Fax: [phone] Website: [email or web address]

## 2024-01-30 ENCOUNTER — Telehealth: Payer: Self-pay

## 2024-01-30 NOTE — Progress Notes (Signed)
Complex Care Management Note Care Guide Note  01/30/2024 Name: Kristen Howe MRN: [id number] DOB: Apr 10, 1946  [name] is a 78 y.o. year old female who is a primary care patient of Corwin Levins, MD . The community resource team was consulted for assistance with Financial Difficulties related to utility bill.  SDOH screenings and interventions completed:  Yes  Social Drivers of Health From This Encounter   Food Insecurity: No Food Insecurity (01/30/2024)   Hunger Vital Sign    Worried About Running Out of Food in the Last Year: Never true    Ran Out of Food in the Last Year: Never true  Housing: Low Risk  (01/30/2024)   Housing Stability Vital Sign    Unable to Pay for Housing in the Last Year: No    Number of Times Moved in the Last Year: 0    Homeless in the Last Year: No  Financial Resource Strain: Medium Risk (01/30/2024)   Overall Financial Resource Strain (CARDIA)    Difficulty of Paying Living Expenses: Somewhat hard  Transportation Needs: No Transportation Needs (01/30/2024)   PRAPARE - Administrator, Civil Service (Medical): No    Lack of Transportation (Non-Medical): No  Utilities: Not At Risk (01/30/2024)   Utilities    Threatened with loss of utilities: No    SDOH Interventions Today    Flowsheet Row Most Recent Value  SDOH Interventions   Utilities Interventions Other (Comment)  [Spoke with patient about  payment plan with [employer].]        Care guide performed the following interventions: Spoke with patient about  payment plan with [employer].  Follow Up Plan:  No further follow up planned at this time. The patient has been provided with needed resources.  Encounter Outcome:  Patient Visit Completed  Eri Mcevers [facility]  [facility] Guide Direct Dial: [phone]  Fax:  [phone] Website: [email or web address]

## 2024-01-30 NOTE — Progress Notes (Signed)
Complex Care Management Note Care Guide Note  01/30/2024 Name: Kristen Howe MRN: [id number] DOB: Dec 15, 1946   Complex Care Management Outreach Attempts: An unsuccessful telephone outreach was attempted today to offer the patient information about available complex care management services.  Follow Up Plan:  Additional outreach attempts will be made to offer the patient complex care management information and services.   Encounter Outcome:  No Answer  Thamara Leger [facility]  [facility] Guide Direct Dial: [phone]  Fax: [phone] Website: [email or web address]

## 2024-04-15 ENCOUNTER — Other Ambulatory Visit: Payer: Self-pay | Admitting: Internal Medicine

## 2024-04-15 ENCOUNTER — Other Ambulatory Visit: Payer: Self-pay

## 2024-04-15 MED ORDER — FLUTICASONE-SALMETEROL 250-50 MCG/ACT IN AEPB: INHALATION_SPRAY | RESPIRATORY_TRACT | 3 refills | Status: AC

## 2024-04-15 NOTE — Telephone Encounter (Signed)
 Copied from CRM [id number]. Topic: Clinical - Medication Refill >> Apr 15, 2024 11:49 AM Elita Guitar wrote: Most Recent Primary Care Visit:  Provider: ALLEN, NICKEAH E  Department: [facility]  Visit Type: MEDICARE AWV, SEQUENTIAL  Date: 01/23/2024  Medication: inhaler but NOT albuterol   Has the patient contacted their pharmacy? Yes Theysaid she had no prescriptions  Is this the correct pharmacy for this prescription? Yes If no, delete pharmacy and type the correct one.  This is the patient's preferred pharmacy:  [id number] [place], [place] - [address] AT [facility] [address] [facility] [address] [place] [address] Phone: [phone] Fax: [phone]    Has the prescription been filled recently? Yes  Is the patient out of the medication? Yes  Has the patient been seen for an appointment in the last year OR does the patient have an upcoming appointment? Yes  Can we respond through MyChart? No  Agent: Please be advised that Rx refills may take up to 3 business days. We ask that you follow-up with your pharmacy.

## 2024-04-15 NOTE — Telephone Encounter (Signed)
 RN called pt to clarify which medication she needs refilled. Pended here.

## 2024-04-19 ENCOUNTER — Telehealth: Payer: Self-pay | Admitting: Internal Medicine

## 2024-04-19 NOTE — Telephone Encounter (Signed)
 Copied from CRM [id number]. Topic: General - Other >> Apr 19, 2024 12:45 PM Kristen Howe wrote: Reason for CRM: Patient is calling because she does not want the albuterol  (VENTOLIN  HFA) 108 (90 Base) MCG/ACT inhaler.  She would like the one she had previously if possible and a new prescription sent in. /would like a call if a new prescription cannot be sent

## 2024-04-20 MED ORDER — ALBUTEROL SULFATE HFA 108 (90 BASE) MCG/ACT IN AERS: 2.0000 | INHALATION_SPRAY | Freq: Four times a day (QID) | RESPIRATORY_TRACT | 11 refills | Status: AC | PRN

## 2024-04-20 NOTE — Telephone Encounter (Signed)
 Ok I resent with rx for any generic ok

## 2024-04-26 ENCOUNTER — Encounter (HOSPITAL_COMMUNITY): Payer: Self-pay

## 2024-05-03 ENCOUNTER — Encounter: Payer: Self-pay | Admitting: Internal Medicine

## 2024-05-03 ENCOUNTER — Ambulatory Visit (INDEPENDENT_AMBULATORY_CARE_PROVIDER_SITE_OTHER): Payer: Medicare Other | Admitting: Internal Medicine

## 2024-05-03 VITALS — BP 124/80 | HR 76 | Temp 98.9°F | Ht 60.0 in | Wt 156.0 lb

## 2024-05-03 DIAGNOSIS — E538 Deficiency of other specified B group vitamins: Secondary | ICD-10-CM

## 2024-05-03 DIAGNOSIS — E559 Vitamin D deficiency, unspecified: Secondary | ICD-10-CM

## 2024-05-03 DIAGNOSIS — F32A Depression, unspecified: Secondary | ICD-10-CM | POA: Diagnosis not present

## 2024-05-03 DIAGNOSIS — E785 Hyperlipidemia, unspecified: Secondary | ICD-10-CM

## 2024-05-03 DIAGNOSIS — Z0000 Encounter for general adult medical examination without abnormal findings: Secondary | ICD-10-CM

## 2024-05-03 DIAGNOSIS — E1165 Type 2 diabetes mellitus with hyperglycemia: Secondary | ICD-10-CM

## 2024-05-03 DIAGNOSIS — I10 Essential (primary) hypertension: Secondary | ICD-10-CM | POA: Diagnosis not present

## 2024-05-03 DIAGNOSIS — Z0001 Encounter for general adult medical examination with abnormal findings: Secondary | ICD-10-CM

## 2024-05-03 LAB — URINALYSIS, ROUTINE W REFLEX MICROSCOPIC
Bilirubin Urine: NEGATIVE
Hgb urine dipstick: NEGATIVE
Leukocytes,Ua: NEGATIVE
Nitrite: NEGATIVE
Specific Gravity, Urine: 1.015 (ref 1.000–1.030)
Urine Glucose: NEGATIVE
Urobilinogen, UA: 0.2 (ref 0.0–1.0)
pH: 6.5 (ref 5.0–8.0)

## 2024-05-03 LAB — BASIC METABOLIC PANEL WITH GFR
BUN: 16 mg/dL (ref 6–23)
CO2: 29 meq/L (ref 19–32)
Calcium: 9.8 mg/dL (ref 8.4–10.5)
Chloride: 105 meq/L (ref 96–112)
Creatinine, Ser: 0.8 mg/dL (ref 0.40–1.20)
GFR: 70.95 mL/min (ref >60.00)
Glucose, Bld: 126 mg/dL — ABNORMAL HIGH (ref 70–99)
Potassium: 4.7 meq/L (ref 3.5–5.1)
Sodium: 141 meq/L (ref 135–145)

## 2024-05-03 LAB — HEPATIC FUNCTION PANEL
ALT: 21 U/L (ref 0–35)
AST: 21 U/L (ref 0–37)
Albumin: 4.3 g/dL (ref 3.5–5.2)
Alkaline Phosphatase: 80 U/L (ref 39–117)
Bilirubin, Direct: 0.3 mg/dL (ref 0.0–0.3)
Total Bilirubin: 0.4 mg/dL (ref 0.2–1.2)
Total Protein: 7.3 g/dL (ref 6.0–8.3)

## 2024-05-03 LAB — CBC WITH DIFFERENTIAL/PLATELET
Basophils Absolute: 0 10*3/uL (ref 0.0–0.1)
Basophils Relative: 0.6 % (ref 0.0–3.0)
Eosinophils Absolute: 0.2 10*3/uL (ref 0.0–0.7)
Eosinophils Relative: 2.8 % (ref 0.0–5.0)
HCT: 40.9 % (ref 36.0–46.0)
Hemoglobin: 13.4 g/dL (ref 12.0–15.0)
Lymphocytes Relative: 18 % (ref 12.0–46.0)
Lymphs Abs: 1.3 10*3/uL (ref 0.7–4.0)
MCHC: 32.8 g/dL (ref 30.0–36.0)
MCV: 86.4 fl (ref 78.0–100.0)
Monocytes Absolute: 0.6 10*3/uL (ref 0.1–1.0)
Monocytes Relative: 8.3 % (ref 3.0–12.0)
Neutro Abs: 5 10*3/uL (ref 1.4–7.7)
Neutrophils Relative %: 70.3 % (ref 43.0–77.0)
Platelets: 274 10*3/uL (ref 150.0–400.0)
RBC: 4.73 Mil/uL (ref 3.87–5.11)
RDW: 14.1 % (ref 11.5–15.5)
WBC: 7.1 10*3/uL (ref 4.0–10.5)

## 2024-05-03 LAB — HEMOGLOBIN A1C: Hgb A1c MFr Bld: 6.7 % — ABNORMAL HIGH (ref 4.6–6.5)

## 2024-05-03 LAB — LIPID PANEL
Cholesterol: 139 mg/dL (ref 0–200)
HDL: 42.2 mg/dL (ref >39.00)
LDL Cholesterol: 63 mg/dL (ref 0–99)
NonHDL: 96.62
Total CHOL/HDL Ratio: 3
Triglycerides: 166 mg/dL — ABNORMAL HIGH (ref 0.0–149.0)
VLDL: 33.2 mg/dL (ref 0.0–40.0)

## 2024-05-03 LAB — MICROALBUMIN / CREATININE URINE RATIO
Creatinine,U: 234 mg/dL
Microalb Creat Ratio: 28.6 mg/g (ref 0.0–30.0)
Microalb, Ur: 6.7 mg/dL — ABNORMAL HIGH (ref 0.0–1.9)

## 2024-05-03 MED ORDER — PANTOPRAZOLE SODIUM 40 MG PO TBEC: DELAYED_RELEASE_TABLET | ORAL | 3 refills | Status: AC

## 2024-05-03 MED ORDER — ATORVASTATIN CALCIUM 10 MG PO TABS: 10.0000 mg | ORAL_TABLET | Freq: Every day | ORAL | 3 refills | Status: AC

## 2024-05-03 MED ORDER — LOSARTAN POTASSIUM 50 MG PO TABS: 50.0000 mg | ORAL_TABLET | Freq: Every day | ORAL | 3 refills | Status: AC

## 2024-05-03 NOTE — Patient Instructions (Signed)

## 2024-05-03 NOTE — Progress Notes (Signed)
 Patient ID: Kristen Howe, female   DOB: 1946/05/26, 78 y.o.   MRN: [id number]         Chief Complaint:: wellness exam and depression, htn, dm, hld       HPI:  Kristen Howe is a 78 y.o. female here for wellness exam; dec;lines colonoscopy, o/w up to date                        Also has mild to mod worsening depressive symptoms, but no suicidal ideation, or panic; has ongoing anxiety o/w stable, chronic persistent/resistant, declines change in tx at this time.  Pt denies chest pain, increased sob or doe, wheezing, orthopnea, PND, increased LE swelling, palpitations, dizziness or syncope.   Pt denies polydipsia, polyuria, or new focal neuro s/s.    Pt denies fever, wt loss, night sweats, loss of appetite, or other constitutional symptoms     Wt Readings from Last 3 Encounters:  05/03/24 156 lb (70.8 kg)  01/22/24 155 lb (70.3 kg)  11/04/23 153 lb (69.4 kg)   BP Readings from Last 3 Encounters:  05/03/24 124/80  01/22/24 128/70  11/04/23 118/74   Immunization History  Administered Date(s) Administered   Fluad Quad(high Dose 65+) 11/13/2022   Pneumococcal Conjugate-13 07/07/2019   Pneumococcal Polysaccharide-23 07/11/2021   Tdap 07/07/2019   Zoster Recombinant(Shingrix) 05/03/2023   There are no preventive care reminders to display for this patient.     Past Medical History:  Diagnosis Date   Allergy    Anemia    as a child   Anxiety    Arthritis    "lower back" (01/25/2016)   Bilateral breast cancer (HCC)    [facility] 01/25/2016   Cataract    Depression    Esophageal diverticulum    GERD (gastroesophageal reflux disease)    Hyperlipidemia    Hypertension    Leg cramps    takes Flexeril    Migraine    "maybe monthly" (01/25/2016)   Osteoporosis    Pneumonia ~ 2005 X 1   Primary cancer of 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 breast (HCC) 12/14/2015   Rheumatic fever 1950s   Past Surgical History:  Procedure Laterality Date   BREAST BIOPSY Bilateral 2016   CATARACT  EXTRACTION W/ INTRAOCULAR LENS  IMPLANT, BILATERAL Bilateral 05/2015-06/2015   CESAREAN SECTION  1974; 1976   COLONOSCOPY     ESOPHAGEAL MANOMETRY N/A 03/10/2017   Procedure: ESOPHAGEAL MANOMETRY (EM);  Surgeon: Asencion Blacksmith, MD;  Location: [facility];  Service: Endoscopy;  Laterality: N/A;   EYE MUSCLE SURGERY Bilateral 1950s   for cross eyes as a child   INSERTION OF MESH N/A 09/12/2015   Procedure: INSERTION OF MESH;  Surgeon: Sim Dryer, MD;  Location: [facility];  Service: General;  Laterality: N/A;   MASTECTOMY COMPLETE / SIMPLE W/ SENTINEL NODE BIOPSY Right 01/25/2016   MASTECTOMY MODIFIED RADICAL Left 01/25/2016   MASTECTOMY W/ SENTINEL NODE BIOPSY Bilateral 01/25/2016   Procedure: RIGHT MASTECTOMY WITH SENTINEL RIGHT LYMPH NODE BIOPSY, LEFT MODIFIED RADICAL MASTECTOMY;  Surgeon: Lockie Rima, MD;  Location: [facility] OR;  Service: General;  Laterality: Bilateral;   TONSILLECTOMY  1950s   UMBILICAL HERNIA REPAIR N/A 09/12/2015   Procedure: REPAIR OF INCISIONAL AND UMBILICAL HERNIA;  Surgeon: Sim Dryer, MD;  Location: [facility] OR;  Service: General;  Laterality: N/A;    reports that she quit smoking about 8 years ago. Her smoking use included cigarettes. She started smoking about 8 years ago. She  has never used smokeless tobacco. She reports that she does not drink alcohol and does not use drugs. family history includes Breast cancer in her cousin, cousin, cousin, cousin, and sister; Cancer in her cousin; Cancer (age of onset: 34) in her maternal grandmother; Diabetes in her father, paternal aunt, and paternal uncle; Gout in her paternal grandfather and paternal grandmother; Healthy in her mother; Heart Problems in her father; Lung cancer in her cousin and maternal uncle; Lupus in her cousin; Lymphoma in her father; Other in her father and mother; Stroke in her maternal grandfather. Allergies  Allergen Reactions   Multihance  [Gadobenate] Itching, Rash and Other (See Comments)    Pt developed immediate  itching, red eyes and rash after MR contrast. Subsided with 25 mg of benadryl . Pt will need 13 hr prep prior to future studies. 02/27/21   Current Outpatient Medications on File Prior to Visit  Medication Sig Dispense Refill   albuterol  (VENTOLIN  HFA) 108 (90 Base) MCG/ACT inhaler Inhale 2 puffs into the lungs every 6 (six) hours as needed for wheezing or shortness of breath. 8 g 11   Artificial Tear Ointment (DRY EYES OP) Apply 1 drop to eye daily as needed (for dry eyes).      Blood Glucose Calibration (ONETOUCH VERIO) High SOLN      Blood Glucose Monitoring Suppl (ONETOUCH VERIO REFLECT) w/Device KIT      Calcium -Phosphorus-Vitamin D  (CITRACAL +D3 PO) Take 2 tablets by mouth 2 (two) times daily.      cyclobenzaprine  (FLEXERIL ) 5 MG tablet Take 1 tablet (5 mg total) by mouth 3 (three) times daily as needed. 60 tablet 5   fluticasone -salmeterol (ADVAIR) 250-50 MCG/ACT AEPB INHALE 1 DOSE BY MOUTH IN THE MORNING AND AT BEDTIME 180 each 3   glucose blood (ACCU-CHEK GUIDE) test strip 1 each by Other route daily. use as directed 100 each 1   ibuprofen  (ADVIL ) 800 MG tablet Take 800 mg by mouth every 6 (six) hours as needed.     Lancet Devices (SIMPLE DIAGNOSTICS LANCING DEV) MISC Apply topically.     meloxicam  (MOBIC ) 15 MG tablet TAKE 1 TABLET(15 MG) BY MOUTH DAILY 30 tablet 0   Multiple Vitamins-Minerals (MULTIVITAMIN WITH MINERALS) tablet Take 1 tablet by mouth daily. Reported on 12/20/2015     ondansetron  (ZOFRAN -ODT) 4 MG disintegrating tablet Take 1 tablet (4 mg total) by mouth every 8 (eight) hours as needed for nausea or vomiting. 30 tablet 0   Pharmacist Choice Lancets MISC      predniSONE  (DELTASONE ) 10 MG tablet 3 tabs by mouth per day for 3 days,2tabs per day for 3 days,1tab per day for 3 days 18 tablet 0   PREVNAR 20 0.5 ML injection      traMADol  (ULTRAM ) 50 MG tablet Take 1 tablet (50 mg total) by mouth every 6 (six) hours as needed. 30 tablet 0   triamcinolone  cream (KENALOG ) 0.1 %  Apply 1 Application topically 2 (two) times daily. 453.6 g 1   venlafaxine  XR (EFFEXOR -XR) 150 MG 24 hr capsule Take 1 capsule (150 mg total) by mouth daily with breakfast. 90 capsule 3   No current facility-administered medications on file prior to visit.        ROS:  All others reviewed and negative.  Objective        PE:  BP 124/80 (BP Location: Right Arm, Patient Position: Sitting, Cuff Size: Normal)   Pulse 76   Temp 98.9 F (37.2 C) (Oral)   Ht 5' (1.524 m)  Wt 156 lb (70.8 kg)   SpO2 95%   BMI 30.47 kg/m                 Constitutional: Pt appears in NAD               HENT: Head: NCAT.                Right Ear: External ear normal.                 Left Ear: External ear normal.                Eyes: . Pupils are equal, round, and reactive to light. Conjunctivae and EOM are normal               Nose: without d/c or deformity               Neck: Neck supple. Gross normal ROM               Cardiovascular: Normal rate and regular rhythm.                 Pulmonary/Chest: Effort normal and breath sounds without rales or wheezing.                Abd:  Soft, NT, ND, + BS, no organomegaly               Neurological: Pt is alert. At baseline orientation, motor grossly intact               Skin: Skin is warm. No rashes, no other new lesions, LE edema - none               Psychiatric: Pt behavior is normal without agitation , depressed affect  Micro: none  Cardiac tracings I have personally interpreted today:  none  Pertinent Radiological findings (summarize): none   Lab Results  Component Value Date   WBC 7.1 05/03/2024   HGB 13.4 05/03/2024   HCT 40.9 05/03/2024   PLT 274.0 05/03/2024   GLUCOSE 126 (H) 05/03/2024   CHOL 139 05/03/2024   TRIG 166.0 (H) 05/03/2024   HDL 42.20 05/03/2024   LDLDIRECT 137.0 06/17/2018   LDLCALC 63 05/03/2024   ALT 21 05/03/2024   AST 21 05/03/2024   NA 141 05/03/2024   K 4.7 05/03/2024   CL 105 05/03/2024   CREATININE 0.80 05/03/2024    BUN 16 05/03/2024   CO2 29 05/03/2024   TSH 4.21 05/03/2024   HGBA1C 6.7 (H) 05/03/2024   MICROALBUR 6.7 (H) 05/03/2024   Assessment/Plan:  Kristen Howe is a 78 y.o. White or Caucasian [1] female with  has a past medical history of Allergy, Anemia, Anxiety, Arthritis, Bilateral breast cancer (HCC), Cataract, Depression, Esophageal diverticulum, GERD (gastroesophageal reflux disease), Hyperlipidemia, Hypertension, Leg cramps, Migraine, Osteoporosis, Pneumonia (~ 2005 X 1), Primary cancer of 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 breast (HCC) (12/14/2015), and Rheumatic fever (1950s).  Encounter for well adult exam with abnormal findings Age and sex appropriate education and counseling updated with regular exercise and diet Referrals for preventative services - declines colonoscopy Immunizations addressed - none needed Smoking counseling  - none needed Evidence for depression or other mood disorder - none significant Most recent labs reviewed. I have personally reviewed and have noted: 1) the patient's medical and social history 2) The patient's current medications and supplements 3) The patient's height, weight, and BMI have been recorded in the chart  Essential hypertension BP Readings from Last 3 Encounters:  05/03/24 124/80  01/22/24 128/70  11/04/23 118/74   Stable, pt to continue medical treatment losartan  50 mg qd   Dyslipidemia Lab Results  Component Value Date   LDLCALC 63 05/03/2024   Stable, pt to continue current statin lipitor 10 qd   Diabetes mellitus (HCC) Lab Results  Component Value Date   HGBA1C 6.7 (H) 05/03/2024   Mild new onset uncontrolled, declines tx for now   Depression With recent mild worsening but chronic persistent, declines change in tx  Followup: Return in about 6 months (around 11/03/2024).  Rosalia Colonel, MD 05/04/2024 8:14 PM [facility] [place]

## 2024-05-04 ENCOUNTER — Ambulatory Visit: Payer: Self-pay | Admitting: Internal Medicine

## 2024-05-04 ENCOUNTER — Encounter: Payer: Self-pay | Admitting: Internal Medicine

## 2024-05-04 LAB — VITAMIN D 25 HYDROXY (VIT D DEFICIENCY, FRACTURES): VITD: 46.45 ng/mL (ref 30.00–100.00)

## 2024-05-04 LAB — VITAMIN B12: Vitamin B-12: 373 pg/mL (ref 211–911)

## 2024-05-04 LAB — TSH: TSH: 4.21 u[IU]/mL (ref 0.35–5.50)

## 2024-05-04 NOTE — Progress Notes (Signed)
 The test results show that your current treatment is OK, as the tests are stable.  Please continue the same plan.  There is no other need for change of treatment or further evaluation based on these results, at this time.  thanks

## 2024-05-04 NOTE — Assessment & Plan Note (Signed)
 With recent mild worsening but chronic persistent, declines change in tx

## 2024-05-04 NOTE — Assessment & Plan Note (Signed)
 Lab Results  Component Value Date   LDLCALC 63 05/03/2024   Stable, pt to continue current statin lipitor 10 qd

## 2024-05-04 NOTE — Assessment & Plan Note (Signed)
 Lab Results  Component Value Date   HGBA1C 6.7 (H) 05/03/2024   Mild new onset uncontrolled, declines tx for now

## 2024-05-04 NOTE — Assessment & Plan Note (Signed)
Age and sex appropriate education and counseling updated with regular exercise and diet Referrals for preventative services - declines colonoscopy Immunizations addressed - none needed Smoking counseling  - none needed Evidence for depression or other mood disorder - none significant Most recent labs reviewed. I have personally reviewed and have noted: 1) the patient's medical and social history 2) The patient's current medications and supplements 3) The patient's height, weight, and BMI have been recorded in the chart  

## 2024-05-04 NOTE — Assessment & Plan Note (Signed)
 BP Readings from Last 3 Encounters:  05/03/24 124/80  01/22/24 128/70  11/04/23 118/74   Stable, pt to continue medical treatment losartan  50 mg qd

## 2024-05-27 DIAGNOSIS — H40023 Open angle with borderline findings, high risk, bilateral: Secondary | ICD-10-CM | POA: Diagnosis not present

## 2024-05-27 DIAGNOSIS — H52223 Regular astigmatism, bilateral: Secondary | ICD-10-CM | POA: Diagnosis not present

## 2024-05-27 DIAGNOSIS — E119 Type 2 diabetes mellitus without complications: Secondary | ICD-10-CM | POA: Diagnosis not present

## 2024-05-27 DIAGNOSIS — H5212 Myopia, left eye: Secondary | ICD-10-CM | POA: Diagnosis not present

## 2024-06-05 ENCOUNTER — Other Ambulatory Visit: Payer: Self-pay | Admitting: Internal Medicine

## 2024-06-05 DIAGNOSIS — F419 Anxiety disorder, unspecified: Secondary | ICD-10-CM

## 2024-06-23 ENCOUNTER — Other Ambulatory Visit: Payer: Self-pay | Admitting: Internal Medicine

## 2024-06-23 MED ORDER — ACCU-CHEK GUIDE TEST VI STRP: ORAL_STRIP | 12 refills | Status: AC

## 2024-06-23 MED ORDER — ACCU-CHEK GUIDE ME W/DEVICE KIT: PACK | 0 refills | Status: AC

## 2024-06-23 NOTE — Telephone Encounter (Signed)
 Ok to let pt know -   The insurance will pay for Accucheck Guide Me meter and strips, so these were sent.

## 2024-08-15 ENCOUNTER — Other Ambulatory Visit: Payer: Self-pay | Admitting: Internal Medicine

## 2024-11-17 ENCOUNTER — Encounter (HOSPITAL_BASED_OUTPATIENT_CLINIC_OR_DEPARTMENT_OTHER): Payer: Self-pay | Admitting: Emergency Medicine

## 2024-11-17 ENCOUNTER — Emergency Department (HOSPITAL_BASED_OUTPATIENT_CLINIC_OR_DEPARTMENT_OTHER)
Admission: EM | Admit: 2024-11-17 | Discharge: 2024-11-17 | Disposition: A | Discharge status: Home / Self Care | Attending: Emergency Medicine | Admitting: Emergency Medicine

## 2024-11-17 ENCOUNTER — Emergency Department (HOSPITAL_BASED_OUTPATIENT_CLINIC_OR_DEPARTMENT_OTHER)

## 2024-11-17 ENCOUNTER — Other Ambulatory Visit: Payer: Self-pay

## 2024-11-17 DIAGNOSIS — S92511A Displaced fracture of proximal phalanx of right lesser toe(s), initial encounter for closed fracture: Secondary | ICD-10-CM

## 2024-11-17 DIAGNOSIS — S92521A Displaced fracture of medial phalanx of right lesser toe(s), initial encounter for closed fracture: Secondary | ICD-10-CM

## 2024-11-17 MED ORDER — IBUPROFEN 400 MG PO TABS
600.0000 mg | ORAL_TABLET | Freq: Once | ORAL | Status: AC
  Administered 2024-11-17: 600 mg via ORAL
  Filled 2024-11-17: qty 1

## 2024-11-17 MED ORDER — ACETAMINOPHEN 500 MG PO TABS
1000.0000 mg | ORAL_TABLET | Freq: Once | ORAL | Status: AC
  Administered 2024-11-17: 1000 mg via ORAL
  Filled 2024-11-17: qty 2

## 2024-11-17 NOTE — Discharge Instructions (Signed)
 Kristen Howe  Thank you for allowing us  to take care of you today.  You came to the Emergency Department today because you dropped a shampoo bottle on your foot 2 days ago and had bruising and pain to your toe on your right foot.  Here in the Emergency Department we did an x-ray which shows that the swollen and purple toe has 2 broken bones in it.  We put you in a hard soled shoe.  You should wear this at all times until told otherwise by the orthopedic surgeon.  We are referring you to the orthopedic surgeon for follow-up.  You can alternate Tylenol  and an NSAID for pain control.  NSAIDs include Celebrex, Advil , Aleve, Motrin , ibuprofen , naproxen.  You should only take 1 NSAID at a time, however you can choose whichever 1 works best for you. To-Do: 1. Please follow-up with your primary doctor within 1 - 2 weeks / as soon as possible.   Please return to the Emergency Department or call 911 if you experience have worsening of your symptoms, or do not get better, chest pain, shortness of breath, severe or significantly worsening pain, high fever, severe confusion, pass out or have any reason to think that you need emergency medical care.   We hope you feel better soon.   Mitzie Later, MD Department of Emergency Medicine [facility]

## 2024-11-17 NOTE — ED Provider Notes (Signed)
 [place] EMERGENCY DEPARTMENT AT [facility] Provider Note   CSN: [id number] Arrival date & time: 11/17/24  9049     History Chief Complaint  Patient presents with   Foot Injury    HPI: Kristen Howe is a 78 y.o. female with history pertinent for T2DM with intact sensation of the feet, HTN, breast cancer, osteoporosis, dyslipidemia, elevated BMI who presents complaining of right foot pain. Patient arrived via POV accompanied by sister.  History provided by patient.  No interpreter required during this encounter.  Patient reports that 2 days ago she dropped a shampoo bottle on her right foot, and since then has had bruising and pain of her right foot.  She is still ambulatory.  Did not have any fall, loss of consciousness at the time.  Denies use of anticoagulants.  He has been using Celebrex for pain control, which she reports works, however it takes a while to kick in.  Given ongoing pain and significant bruising she decided to come to the emergency department for further evaluation  Patient's recorded medical, surgical, social, medication list and allergies were reviewed in the Snapshot window as part of the initial history.   Prior to Admission medications   Medication Sig Start Date End Date Taking? Authorizing Provider  albuterol  (VENTOLIN  HFA) 108 (90 Base) MCG/ACT inhaler Inhale 2 puffs into the lungs every 6 (six) hours as needed for wheezing or shortness of breath. 04/20/24   Norleen Lynwood ORN, MD  Artificial Tear Ointment (DRY EYES OP) Apply 1 drop to eye daily as needed (for dry eyes).     [provider]  atorvastatin  (LIPITOR) 10 MG tablet Take 1 tablet (10 mg total) by mouth daily. 05/03/24   Norleen Lynwood ORN, MD  Blood Glucose Calibration [facility] VERIO) High SOLN  10/02/22   [provider]  Blood Glucose Monitoring Suppl (ACCU-CHEK GUIDE ME) w/Device KIT Use as directed up to 4 times per day 06/23/24   Norleen Lynwood ORN, MD  Calcium -Phosphorus-Vitamin D   (CITRACAL +D3 PO) Take 2 tablets by mouth 2 (two) times daily.     [provider]  cyclobenzaprine  (FLEXERIL ) 5 MG tablet TAKE 1 TABLET(5 MG) BY MOUTH THREE TIMES DAILY AS NEEDED 08/17/24   Norleen Lynwood ORN, MD  fluticasone -salmeterol (ADVAIR) 250-50 MCG/ACT AEPB INHALE 1 DOSE BY MOUTH IN THE MORNING AND AT BEDTIME 04/15/24   Norleen Lynwood ORN, MD  glucose blood (ACCU-CHEK GUIDE TEST) test strip Use as instructed twice per day E11.9 06/23/24   Norleen Lynwood ORN, MD  ibuprofen  (ADVIL ) 800 MG tablet Take 800 mg by mouth every 6 (six) hours as needed. 08/28/20   [provider]  Lancet Devices (SIMPLE DIAGNOSTICS LANCING DEV) MISC Apply topically. 10/02/22   [provider]  losartan  (COZAAR ) 50 MG tablet Take 1 tablet (50 mg total) by mouth daily. 05/03/24   Norleen Lynwood ORN, MD  meloxicam  (MOBIC ) 15 MG tablet TAKE 1 TABLET(15 MG) BY MOUTH DAILY 06/11/23   Eldonna Novel, MD  Multiple Vitamins-Minerals (MULTIVITAMIN WITH MINERALS) tablet Take 1 tablet by mouth daily. Reported on 12/20/2015    [provider]  ondansetron  (ZOFRAN -ODT) 4 MG disintegrating tablet Take 1 tablet (4 mg total) by mouth every 8 (eight) hours as needed for nausea or vomiting. 05/06/23   Norleen Lynwood ORN, MD  pantoprazole  (PROTONIX ) 40 MG tablet 1 tab by mouth twice per day 05/03/24   Norleen Lynwood ORN, MD  Pharmacist Choice Lancets MISC  09/04/23   [provider]  predniSONE  (DELTASONE ) 10 MG tablet 3 tabs by mouth per day for 3 days,2tabs per day for 3 days,1tab per day for 3 days 11/04/23   Norleen Lynwood ORN, MD  PREVNAR 20 0.5 ML injection  06/01/22   [provider]  traMADol  (ULTRAM ) 50 MG tablet Take 1 tablet (50 mg total) by mouth every 6 (six) hours as needed. 05/06/23   Norleen Lynwood ORN, MD  venlafaxine  XR (EFFEXOR -XR) 150 MG 24 hr capsule Take 1 capsule (150 mg total) by mouth daily with breakfast. 11/04/23   Norleen Lynwood ORN, MD     Allergies: Multihance  [gadobenate]   Review of Systems   ROS as per  HPI  Physical Exam Updated Vital Signs BP 113/68   Pulse 70   Resp 16   SpO2 91%  Physical Exam Vitals and nursing note reviewed.  Constitutional:      General: She is not in acute distress.    Appearance: She is well-developed.  HENT:     Head: Normocephalic and atraumatic.  Eyes:     Conjunctiva/sclera: Conjunctivae normal.  Cardiovascular:     Rate and Rhythm: Normal rate.  Pulmonary:     Effort: Pulmonary effort is normal.  Abdominal:     Palpations: Abdomen is soft.     Tenderness: There is no abdominal tenderness.  Musculoskeletal:        General: No swelling.     Cervical back: Neck supple.     Comments: Normal extensor and flexor function of all toes, intact sensation to light touch in all nerve distributions.  Patient does have swelling of the fourth digit of the right foot with associated ecchymosis, see image, patient does not have any significant plantar bruising, or pain to palpation of the base of the metatarsals.  Intact capillary refill  Skin:    General: Skin is warm and dry.     Capillary Refill: Capillary refill takes less than 2 seconds.  Neurological:     Mental Status: She is alert.     Gait: Gait normal.  Psychiatric:        Mood and Affect: Mood normal.        ED Course/ Medical Decision Making/ A&P    Procedures Procedures   Medications Ordered in ED Medications  acetaminophen  (TYLENOL ) tablet 1,000 mg (1,000 mg Oral Given 11/17/24 1129)  ibuprofen  (ADVIL ) tablet 600 mg (600 mg Oral Given 11/17/24 1129)    Medical Decision Making:   Kristen Howe is a 78 y.o. female who presents for right foot pain as per above.  Physical exam is pertinent for ecchymosis of the right foot with swelling of the fourth digit.   The differential includes but is not limited to fracture, dislocation, Lisfranc injury, soft tissue contusion.  Independent historian: None  External data reviewed: No pertinent external data  Labs: Not  indicated  Radiology: Ordered, Independent interpretation, Details: I do appreciate cortical irregularity of the intermediate phalanx of the fourth digit of the right foot, and All images reviewed independently.  Agree with radiology report at this time.   DG Foot Complete Right Result Date: 11/17/2024 EXAM: 3 OR MORE VIEW(S) XRAY OF THE RIGHT FOOT 11/17/2024 10:16:00 AM COMPARISON: None available. CLINICAL HISTORY: [id number] Pain 144615 Pain FINDINGS: BONES AND JOINTS: Fracture through the distal aspect of the right 4th toe proximal phalanx and the base of the right 4th toe middle phalanx. No subluxation or dislocation. SOFT TISSUES: The soft tissues are unremarkable. IMPRESSION: 1. Fracture through the distal aspect  of the right 4th toe proximal phalanx and the base of the right 4th toe middle phalanx. Electronically signed by: Franky Crease MD 11/17/2024 10:45 AM EST RP Workstation: [id number]    EKG/Medicine tests: Not indicated EKG Interpretation:                  Interventions: Tylenol , ibuprofen   See the EMR for full details regarding lab and imaging results.  Presents to the emergency department for right foot pain.  Patient dropped a shampoo bottle onto her foot, denies any associated trauma to other part of her body or falls, denies any pain elsewhere, no other overt indications of trauma on exam.  Patient does not warrant imaging of her head or neck given she did not have any direct trauma to this area, and she did not have associated fall.  Pain is primarily localized to the fourth toe.  X-ray does reveal underlying fractures of the proximal phalanx and the intermediate phalanx.  Patient's pain is well-controlled with Celebrex at home, given Tylenol  and ibuprofen  in the ED, discussed use of Tylenol  at home in addition to NSAID of choice.  Patient expressed understanding.  Patient placed in a [place] shoe, given referral to follow-up with orthopedic surgery for further management.  Patient and  sister at bedside comfortable with this plan.  Presentation is most consistent with acute uncomplicated illness  Discussion of management or test interpretations with external provider(s): Not indicated  Risk Drugs:OTC drugs  Disposition: DISCHARGE: I believe that the patient is safe for discharge home with outpatient follow-up. Patient was informed of all pertinent physical exam, laboratory, and imaging findings. Patient's suspected etiology of their symptom presentation was discussed with the patient and all questions were answered. We discussed following up with PCP, orthopedic surgery. I provided thorough ED return precautions. The patient feels safe and comfortable with this plan.  MDM generated using voice dictation software and may contain dictation errors.  Please contact me for any clarification or with any questions.  Clinical Impression:  1. Displaced fracture of middle phalanx of right lesser toe(s), initial encounter for closed fracture   2. Displaced fracture of proximal phalanx of right lesser toe(s), initial encounter for closed fracture      Discharge   Final Clinical Impression(s) / ED Diagnoses Final diagnoses:  Displaced fracture of middle phalanx of right lesser toe(s), initial encounter for closed fracture  Displaced fracture of proximal phalanx of right lesser toe(s), initial encounter for closed fracture    Rx / DC Orders ED Discharge Orders          Ordered    AMB referral to orthopedics        11/17/24 1149             Rogelia Jerilynn RAMAN, MD 11/22/24 1358

## 2024-11-17 NOTE — ED Triage Notes (Signed)
 Reports dropped bottle on R foot 2 days prior. Bruising noted to foot and toes. Ambulatory.  T2DM.

## 2024-12-01 ENCOUNTER — Ambulatory Visit: Payer: Self-pay

## 2024-12-01 NOTE — Telephone Encounter (Signed)
 FYI Only or Action Required?: FYI only for provider: appointment scheduled on 12/02/2024.  Patient was last seen in primary care on 05/03/2024 by Norleen Lynwood ORN, MD.  Called Nurse Triage reporting Pain.  Symptoms began x 2 weeks and worsening.  Interventions attempted: Nothing.  Symptoms are: gradually worsening.  Triage Disposition: See Physician Within 24 Hours  Patient/caregiver understands and will follow disposition?: Yes   Copied from CRM #[id number]. Topic: Clinical - Red Word Triage >> Dec 01, 2024 11:28 AM Frederich PARAS wrote: [facility] that prompted transfer to Nurse Triage: pain discomfort, broken toe pt broke toe 2 weeks ago, pt discomfort, pain, swollen, . liump, no leaking fluids, its already have turned colors prior Reason for Disposition  Looks like a boil, infected sore, or deep ulcer  Answer Assessment - Initial Assessment Questions 1. ONSET: When did the pain start?      2 weeks 2. LOCATION: Where is the pain located?      Right fourth - broken 3. PAIN: How bad is the pain?    (Scale 1-10; or mild, moderate, severe)     3/10 4. WORK OR EXERCISE: Has there been any recent work or exercise that involved this part of the body?      na 5. CAUSE: What do you think is causing the foot pain?     Dropped bottle of shampoo on it 6. OTHER SYMPTOMS: Do you have any other symptoms? (e.g., leg pain, rash, fever, numbness)     Pain, swelling, discoloration - pain sometimes goes up leg 7. PREGNANCY: Is there any chance you are pregnant? When was your last menstrual period?     no  Protocols used: Foot Pain-A-AH

## 2024-12-02 ENCOUNTER — Ambulatory Visit (INDEPENDENT_AMBULATORY_CARE_PROVIDER_SITE_OTHER): Admitting: Internal Medicine

## 2024-12-02 ENCOUNTER — Encounter: Payer: Self-pay | Admitting: Internal Medicine

## 2024-12-02 ENCOUNTER — Ambulatory Visit: Payer: Self-pay | Admitting: Internal Medicine

## 2024-12-02 VITALS — BP 122/80 | HR 84 | Temp 98.1°F | Ht 60.0 in

## 2024-12-02 DIAGNOSIS — I10 Essential (primary) hypertension: Secondary | ICD-10-CM | POA: Diagnosis not present

## 2024-12-02 DIAGNOSIS — S92504D Nondisplaced unspecified fracture of right lesser toe(s), subsequent encounter for fracture with routine healing: Secondary | ICD-10-CM | POA: Diagnosis not present

## 2024-12-02 DIAGNOSIS — E785 Hyperlipidemia, unspecified: Secondary | ICD-10-CM | POA: Diagnosis not present

## 2024-12-02 DIAGNOSIS — S92911A Unspecified fracture of right toe(s), initial encounter for closed fracture: Secondary | ICD-10-CM | POA: Insufficient documentation

## 2024-12-02 DIAGNOSIS — R251 Tremor, unspecified: Secondary | ICD-10-CM | POA: Diagnosis not present

## 2024-12-02 DIAGNOSIS — E1165 Type 2 diabetes mellitus with hyperglycemia: Secondary | ICD-10-CM | POA: Diagnosis not present

## 2024-12-02 LAB — BASIC METABOLIC PANEL WITH GFR
BUN: 16 mg/dL (ref 6–23)
CO2: 29 meq/L (ref 19–32)
Calcium: 9.8 mg/dL (ref 8.4–10.5)
Chloride: 105 meq/L (ref 96–112)
Creatinine, Ser: 0.81 mg/dL (ref 0.40–1.20)
GFR: 69.61 mL/min (ref >60.00)
Glucose, Bld: 112 mg/dL — ABNORMAL HIGH (ref 70–99)
Potassium: 4.3 meq/L (ref 3.5–5.1)
Sodium: 141 meq/L (ref 135–145)

## 2024-12-02 LAB — HEMOGLOBIN A1C: Hgb A1c MFr Bld: 6.9 % — ABNORMAL HIGH (ref 4.6–6.5)

## 2024-12-02 LAB — HEPATIC FUNCTION PANEL
ALT: 18 U/L (ref 3–35)
AST: 18 U/L (ref 5–37)
Albumin: 4.1 g/dL (ref 3.5–5.2)
Alkaline Phosphatase: 79 U/L (ref 39–117)
Bilirubin, Direct: 0.1 mg/dL (ref 0.1–0.3)
Total Bilirubin: 0.5 mg/dL (ref 0.2–1.2)
Total Protein: 7.1 g/dL (ref 6.0–8.3)

## 2024-12-02 LAB — LIPID PANEL
Cholesterol: 132 mg/dL (ref 28–200)
HDL: 36.4 mg/dL — ABNORMAL LOW (ref >39.00)
LDL Cholesterol: 71 mg/dL (ref 10–99)
NonHDL: 95.88
Total CHOL/HDL Ratio: 4
Triglycerides: 125 mg/dL (ref 10.0–149.0)
VLDL: 25 mg/dL (ref 0.0–40.0)

## 2024-12-02 NOTE — Assessment & Plan Note (Signed)
 With hyperglycemia

## 2024-12-02 NOTE — Progress Notes (Signed)
 The test results show that your current treatment is OK, as the tests are stable.  Please continue the same plan.  There is no other need for change of treatment or further evaluation based on these results, at this time.  thanks

## 2024-12-02 NOTE — Patient Instructions (Signed)

## 2024-12-02 NOTE — Progress Notes (Unsigned)
 Patient ID: Kristen Howe, female   DOB: [birth year], 78 y.o.   MRN: [id number]        Chief Complaint: follow up dm, right 4th toe fracture, essential tremor, htn, hld       HPI:  Kristen Howe is a 78 y.o. female here  to follow up overall doing ok.  Pt denies chest pain, increased sob or doe, wheezing, orthopnea, PND, increased LE swelling, palpitations, dizziness or syncope.   Pt denies polydipsia, polyuria, or new focal neuro s/s.    Pt denies fever, wt loss, night sweats, loss of appetite, or other constitutional symptoms  Did drop an object on her right 4th toe with non displace fractures x 2 about 2 wks ago, now some improved swelling and brusiing but still wearing the soft shoe cast.  Also has slightly worsening left > right hand essential tremors       Wt Readings from Last 3 Encounters:  05/03/24 156 lb (70.8 kg)  01/22/24 155 lb (70.3 kg)  11/04/23 153 lb (69.4 kg)   BP Readings from Last 3 Encounters:  12/02/24 122/80  11/17/24 113/68  05/03/24 124/80         Past Medical History:  Diagnosis Date   Allergy    Anemia    as a child   Anxiety    Arthritis    lower back (01/25/2016)   Bilateral breast cancer (HCC)    [facility]bert 01/25/2016   Cataract    Depression    Esophageal diverticulum    GERD (gastroesophageal reflux disease)    Hyperlipidemia    Hypertension    Leg cramps    takes Flexeril    Migraine    maybe monthly (01/25/2016)   Osteoporosis    Pneumonia ~ 2005 X 1   Primary cancer of 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 breast (HCC) 12/14/2015   Rheumatic fever 1950s   Past Surgical History:  Procedure Laterality Date   BREAST BIOPSY Bilateral 2016   CATARACT EXTRACTION W/ INTRAOCULAR LENS  IMPLANT, BILATERAL Bilateral 05/2015-06/2015   CESAREAN SECTION  1974; 1976   COLONOSCOPY     ESOPHAGEAL MANOMETRY N/A 03/10/2017   Procedure: ESOPHAGEAL MANOMETRY (EM);  Surgeon: Gwendlyn ONEIDA Buddy, MD;  Location: [facility];  Service: Endoscopy;  Laterality: N/A;   EYE  MUSCLE SURGERY Bilateral 1950s   for cross eyes as a child   INSERTION OF MESH N/A 09/12/2015   Procedure: INSERTION OF MESH;  Surgeon: Debby Shipper, MD;  Location: [facility];  Service: General;  Laterality: N/A;   MASTECTOMY COMPLETE / SIMPLE W/ SENTINEL NODE BIOPSY Right 01/25/2016   MASTECTOMY MODIFIED RADICAL Left 01/25/2016   MASTECTOMY W/ SENTINEL NODE BIOPSY Bilateral 01/25/2016   Procedure: RIGHT MASTECTOMY WITH SENTINEL RIGHT LYMPH NODE BIOPSY, LEFT MODIFIED RADICAL MASTECTOMY;  Surgeon: Jina Nephew, MD;  Location: [facility];  Service: General;  Laterality: Bilateral;   TONSILLECTOMY  1950s   UMBILICAL HERNIA REPAIR N/A 09/12/2015   Procedure: REPAIR OF INCISIONAL AND UMBILICAL HERNIA;  Surgeon: Debby Shipper, MD;  Location: [facility];  Service: General;  Laterality: N/A;    reports that she quit smoking about 8 years ago. Her smoking use included cigarettes. She started smoking about 8 years ago. She smoked an average of 1 pack per day. She has never used smokeless tobacco. She reports that she does not drink alcohol and does not use drugs. family history includes Breast cancer in her cousin, cousin, cousin, cousin, and sister; Cancer in her cousin; Cancer (age of  onset: 73) in her maternal grandmother; Diabetes in her father, paternal aunt, and paternal uncle; Gout in her paternal grandfather and paternal grandmother; Healthy in her mother; Heart Problems in her father; Lung cancer in her cousin and maternal uncle; Lupus in her cousin; Lymphoma in her father; Other in her father and mother; Stroke in her maternal grandfather. Allergies[1] Medications Ordered Prior to Encounter[2]      ROS:  All others reviewed and negative.  Objective        PE:  BP 122/80 (BP Location: Left Arm, Patient Position: Sitting, Cuff Size: Normal)   Pulse 84   Temp 98.1 F (36.7 C) (Oral)   Ht 5' (1.524 m)   SpO2 94%   BMI 30.47 kg/m                 Constitutional: Pt appears in NAD               HENT: Head: NCAT.                 Right Ear: External ear normal.                 Left Ear: External ear normal.                Eyes: . Pupils are equal, round, and reactive to light. Conjunctivae and EOM are normal               Nose: without d/c or deformity               Neck: Neck supple. Gross normal ROM               Cardiovascular: Normal rate and regular rhythm.                 Pulmonary/Chest: Effort normal and breath sounds without rales or wheezing.                Abd:  Soft, NT, ND, + BS, no organomegaly               Neurological: Pt is alert. At baseline orientation, motor grossly intact               Skin: Skin is warm. No rashes, no other new lesions, LE edema - none, right 4th toe mild swelling bruising o/w no ulcer and o/w neurovasc intact               Psychiatric: Pt behavior is normal without agitation   Micro: none  Cardiac tracings I have personally interpreted today:  none  Pertinent Radiological findings (summarize): none   Lab Results  Component Value Date   WBC 7.1 05/03/2024   HGB 13.4 05/03/2024   HCT 40.9 05/03/2024   PLT 274.0 05/03/2024   GLUCOSE 112 (H) 12/02/2024   CHOL 132 12/02/2024   TRIG 125.0 12/02/2024   HDL 36.40 (L) 12/02/2024   LDLDIRECT 137.0 06/17/2018   LDLCALC 71 12/02/2024   ALT 18 12/02/2024   AST 18 12/02/2024   NA 141 12/02/2024   K 4.3 12/02/2024   CL 105 12/02/2024   CREATININE 0.81 12/02/2024   BUN 16 12/02/2024   CO2 29 12/02/2024   TSH 4.21 05/03/2024   HGBA1C 6.9 (H) 12/02/2024   MICROALBUR 6.7 (H) 05/03/2024   Assessment/Plan:  Kristen Howe is a 78 y.o. White or Caucasian [1] female with  has a past medical history of Allergy, Anemia, Anxiety, Arthritis, Bilateral breast cancer (HCC),  Cataract, Depression, Esophageal diverticulum, GERD (gastroesophageal reflux disease), Hyperlipidemia, Hypertension, Leg cramps, Migraine, Osteoporosis, Pneumonia (~ 2005 X 1), Primary cancer of 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 breast (HCC)  (12/14/2015), and Rheumatic fever (1950s).  Diabetes mellitus (HCC) With hyperglycemia  Lab Results  Component Value Date   HGBA1C 6.9 (H) 12/02/2024   Mild uncontrolled, pt to continue diet, wt control, declines other change for now such as start GLP1    Tremor With mild recent worsening but decliens tx for now such as BB  Toe fracture, right Improveing with routine healing, no complications,  to f/u any worsening symptoms or concerns  Essential hypertension BP Readings from Last 3 Encounters:  12/02/24 122/80  11/17/24 113/68  05/03/24 124/80   Stable, pt to continue medical treatment losartan  50 mg qd   Dyslipidemia Lab Results  Component Value Date   LDLCALC 71 12/02/2024   uncontrolled, pt to continue current statin lipitor 10 mg every day and lower chol  diet, declines change today for now '  Followup: Return in about 6 months (around 06/02/2025).  Lynwood Rush, MD 12/04/2024 11:17 AM [facility]  Primary Care - [facility]     [1]  Allergies Allergen Reactions   Multihance  [Gadobenate] Itching, Rash and Other (See Comments)    Pt developed immediate itching, red eyes and rash after MR contrast. Subsided with 25 mg of benadryl . Pt will need 13 hr prep prior to future studies. 02/27/21  [2]  Current Outpatient Medications on File Prior to Visit  Medication Sig Dispense Refill   albuterol  (VENTOLIN  HFA) 108 (90 Base) MCG/ACT inhaler Inhale 2 puffs into the lungs every 6 (six) hours as needed for wheezing or shortness of breath. 8 g 11   Artificial Tear Ointment (DRY EYES OP) Apply 1 drop to eye daily as needed (for dry eyes).      atorvastatin  (LIPITOR) 10 MG tablet Take 1 tablet (10 mg total) by mouth daily. 90 tablet 3   Blood Glucose Calibration (ONETOUCH VERIO) High SOLN      Blood Glucose Monitoring Suppl (ACCU-CHEK GUIDE ME) w/Device KIT Use as directed up to 4 times per day 1 kit 0   Calcium -Phosphorus-Vitamin D   (CITRACAL +D3 PO) Take 2 tablets by mouth 2 (two) times daily.      cyclobenzaprine  (FLEXERIL ) 5 MG tablet TAKE 1 TABLET(5 MG) BY MOUTH THREE TIMES DAILY AS NEEDED 60 tablet 5   fluticasone -salmeterol (ADVAIR) 250-50 MCG/ACT AEPB INHALE 1 DOSE BY MOUTH IN THE MORNING AND AT BEDTIME 180 each 3   glucose blood (ACCU-CHEK GUIDE TEST) test strip Use as instructed twice per day E11.9 200 each 12   ibuprofen  (ADVIL ) 800 MG tablet Take 800 mg by mouth every 6 (six) hours as needed.     Lancet Devices (SIMPLE DIAGNOSTICS LANCING DEV) MISC Apply topically.     losartan  (COZAAR ) 50 MG tablet Take 1 tablet (50 mg total) by mouth daily. 90 tablet 3   meloxicam  (MOBIC ) 15 MG tablet TAKE 1 TABLET(15 MG) BY MOUTH DAILY 30 tablet 0   Multiple Vitamins-Minerals (MULTIVITAMIN WITH MINERALS) tablet Take 1 tablet by mouth daily. Reported on 12/20/2015     ondansetron  (ZOFRAN -ODT) 4 MG disintegrating tablet Take 1 tablet (4 mg total) by mouth every 8 (eight) hours as needed for nausea or vomiting. 30 tablet 0   pantoprazole  (PROTONIX ) 40 MG tablet 1 tab by mouth twice per day 180 tablet 3   Pharmacist Choice Lancets MISC  predniSONE  (DELTASONE ) 10 MG tablet 3 tabs by mouth per day for 3 days,2tabs per day for 3 days,1tab per day for 3 days 18 tablet 0   PREVNAR 20 0.5 ML injection      traMADol  (ULTRAM ) 50 MG tablet Take 1 tablet (50 mg total) by mouth every 6 (six) hours as needed. 30 tablet 0   venlafaxine  XR (EFFEXOR -XR) 150 MG 24 hr capsule Take 1 capsule (150 mg total) by mouth daily with breakfast. 90 capsule 3   No current facility-administered medications on file prior to visit.

## 2024-12-04 NOTE — Assessment & Plan Note (Signed)
 BP Readings from Last 3 Encounters:  12/02/24 122/80  11/17/24 113/68  05/03/24 124/80   Stable, pt to continue medical treatment losartan  50 mg qd

## 2024-12-04 NOTE — Assessment & Plan Note (Signed)
 Improveing with routine healing, no complications,  to f/u any worsening symptoms or concerns

## 2024-12-04 NOTE — Assessment & Plan Note (Signed)
 Lab Results  Component Value Date   LDLCALC 71 12/02/2024   uncontrolled, pt to continue current statin lipitor 10 mg every day and lower chol  diet, declines change today for now '

## 2024-12-04 NOTE — Assessment & Plan Note (Signed)
 With mild recent worsening but decliens tx for now such as BB

## 2024-12-28 ENCOUNTER — Other Ambulatory Visit: Payer: Self-pay | Admitting: Internal Medicine

## 2024-12-28 MED ORDER — VENLAFAXINE HCL ER 150 MG PO CP24: 150.0000 mg | ORAL_CAPSULE | Freq: Every day | ORAL | 3 refills | Status: AC

## 2024-12-28 NOTE — Telephone Encounter (Signed)
 Copied from CRM #[id number]. Topic: Clinical - Medication Refill >> Dec 28, 2024  9:48 AM Deleta RAMAN wrote: Medication: venlafaxine  XR (EFFEXOR -XR) 150 MG 24 hr capsule   Has the patient contacted their pharmacy? Yes (Agent: If no, request that the patient contact the pharmacy for the refill. If patient does not wish to contact the pharmacy document the reason why and proceed with request.) (Agent: If yes, when and what did the pharmacy advise?)  This is the patient's preferred pharmacy:  [id number] [place], [place] - [address] AT [facility] [address] [facility] [address] & [facility] [address] [place] [address] Phone: [phone] Fax: [phone]  Is this the correct pharmacy for this prescription? Yes If no, delete pharmacy and type the correct one.   Has the prescription been filled recently? Yes  Is the patient out of the medication? Yes  Has the patient been seen for an appointment in the last year OR does the patient have an upcoming appointment? Yes  Can we respond through MyChart? Yes  Agent: Please be advised that Rx refills may take up to 3 business days. We ask that you follow-up with your pharmacy.

## 2025-01-24 ENCOUNTER — Ambulatory Visit: Payer: Medicare Other
